# Patient Record
Sex: Female | Born: 1955 | Race: White | Hispanic: No | Smoking: Never smoker
Health system: Southern US, Community
[De-identification: ages and names within clinical notes are randomized; demographics above are authoritative.]

## PROBLEM LIST (undated history)

## (undated) DIAGNOSIS — Z932 Ileostomy status: Secondary | ICD-10-CM

## (undated) DIAGNOSIS — Z95828 Presence of other vascular implants and grafts: Secondary | ICD-10-CM

## (undated) DIAGNOSIS — F1190 Opioid use, unspecified, uncomplicated: Secondary | ICD-10-CM

## (undated) DIAGNOSIS — Z8619 Personal history of other infectious and parasitic diseases: Secondary | ICD-10-CM

## (undated) DIAGNOSIS — G620 Drug-induced polyneuropathy: Secondary | ICD-10-CM

## (undated) DIAGNOSIS — K5190 Ulcerative colitis, unspecified, without complications: Secondary | ICD-10-CM

## (undated) DIAGNOSIS — R9430 Abnormal result of cardiovascular function study, unspecified: Secondary | ICD-10-CM

## (undated) DIAGNOSIS — C569 Malignant neoplasm of unspecified ovary: Secondary | ICD-10-CM

## (undated) DIAGNOSIS — T451X5A Adverse effect of antineoplastic and immunosuppressive drugs, initial encounter: Secondary | ICD-10-CM

## (undated) DIAGNOSIS — IMO0002 Reserved for concepts with insufficient information to code with codable children: Secondary | ICD-10-CM

## (undated) DIAGNOSIS — N182 Chronic kidney disease, stage 2 (mild): Secondary | ICD-10-CM

## (undated) DIAGNOSIS — C7951 Secondary malignant neoplasm of bone: Secondary | ICD-10-CM

## (undated) DIAGNOSIS — N1330 Unspecified hydronephrosis: Secondary | ICD-10-CM

## (undated) DIAGNOSIS — C772 Secondary and unspecified malignant neoplasm of intra-abdominal lymph nodes: Secondary | ICD-10-CM

## (undated) DIAGNOSIS — Z9981 Dependence on supplemental oxygen: Secondary | ICD-10-CM

## (undated) DIAGNOSIS — J90 Pleural effusion, not elsewhere classified: Secondary | ICD-10-CM

## (undated) DIAGNOSIS — I10 Essential (primary) hypertension: Secondary | ICD-10-CM

## (undated) DIAGNOSIS — D6959 Other secondary thrombocytopenia: Secondary | ICD-10-CM

## (undated) DIAGNOSIS — Z973 Presence of spectacles and contact lenses: Secondary | ICD-10-CM

## (undated) DIAGNOSIS — C7800 Secondary malignant neoplasm of unspecified lung: Secondary | ICD-10-CM

## (undated) DIAGNOSIS — IMO0001 Reserved for inherently not codable concepts without codable children: Secondary | ICD-10-CM

## (undated) DIAGNOSIS — G8929 Other chronic pain: Secondary | ICD-10-CM

## (undated) HISTORY — DX: Reserved for concepts with insufficient information to code with codable children: IMO0002

## (undated) HISTORY — DX: Reserved for inherently not codable concepts without codable children: IMO0001

## (undated) HISTORY — DX: Essential (primary) hypertension: I10

## (undated) HISTORY — PX: OTHER SURGICAL HISTORY: SHX169

---

## 1990-05-24 HISTORY — PX: CHOLECYSTECTOMY: SHX55

## 1998-12-25 ENCOUNTER — Other Ambulatory Visit: Admission: RE | Admit: 1998-12-25 | Discharge: 1998-12-25 | Payer: Self-pay | Admitting: Obstetrics and Gynecology

## 1999-12-10 ENCOUNTER — Encounter: Admission: RE | Admit: 1999-12-10 | Discharge: 1999-12-10 | Payer: Self-pay | Admitting: General Surgery

## 1999-12-10 ENCOUNTER — Encounter: Payer: Self-pay | Admitting: General Surgery

## 2000-12-13 ENCOUNTER — Encounter: Admission: RE | Admit: 2000-12-13 | Discharge: 2000-12-13 | Payer: Self-pay | Admitting: General Surgery

## 2000-12-13 ENCOUNTER — Encounter: Payer: Self-pay | Admitting: General Surgery

## 2002-12-04 ENCOUNTER — Encounter: Payer: Self-pay | Admitting: Obstetrics and Gynecology

## 2002-12-04 ENCOUNTER — Encounter: Admission: RE | Admit: 2002-12-04 | Discharge: 2002-12-04 | Payer: Self-pay | Admitting: Obstetrics and Gynecology

## 2005-01-14 ENCOUNTER — Encounter: Admission: RE | Admit: 2005-01-14 | Discharge: 2005-01-14 | Payer: Self-pay | Admitting: Obstetrics and Gynecology

## 2006-12-20 ENCOUNTER — Ambulatory Visit: Payer: Self-pay | Admitting: Family Medicine

## 2006-12-20 DIAGNOSIS — I10 Essential (primary) hypertension: Secondary | ICD-10-CM | POA: Insufficient documentation

## 2006-12-28 LAB — CONVERTED CEMR LAB
BUN: 11 mg/dL (ref 6–23)
Basophils Relative: 1.4 % — ABNORMAL HIGH (ref 0.0–1.0)
Calcium: 9.7 mg/dL (ref 8.4–10.5)
Cholesterol: 190 mg/dL (ref 0–200)
Creatinine, Ser: 0.7 mg/dL (ref 0.4–1.2)
GFR calc non Af Amer: 94 mL/min
Glucose, Bld: 91 mg/dL (ref 70–99)
HCT: 43 % (ref 36.0–46.0)
HDL: 41.7 mg/dL (ref >39.0)
Hemoglobin: 14.9 g/dL (ref 12.0–15.0)
MCHC: 34.6 g/dL (ref 30.0–36.0)
MCV: 89.3 fL (ref 78.0–100.0)
Neutrophils Relative %: 66 % (ref 43.0–77.0)
TSH: 0.83 microintl units/mL (ref 0.35–5.50)
Total CHOL/HDL Ratio: 4.6
Total Protein: 6.6 g/dL (ref 6.0–8.3)
Triglycerides: 202 mg/dL (ref 0–149)
VLDL: 40 mg/dL (ref 0–40)

## 2007-01-16 ENCOUNTER — Ambulatory Visit: Payer: Self-pay | Admitting: Family Medicine

## 2007-02-24 ENCOUNTER — Encounter: Admission: RE | Admit: 2007-02-24 | Discharge: 2007-02-24 | Payer: Self-pay | Admitting: Family Medicine

## 2007-03-08 ENCOUNTER — Encounter (INDEPENDENT_AMBULATORY_CARE_PROVIDER_SITE_OTHER): Payer: Self-pay | Admitting: *Deleted

## 2007-03-27 ENCOUNTER — Ambulatory Visit: Payer: Self-pay | Admitting: Family Medicine

## 2007-03-28 LAB — CONVERTED CEMR LAB
Albumin: 4.2 g/dL (ref 3.5–5.2)
Alkaline Phosphatase: 75 units/L (ref 39–117)
Bilirubin, Direct: 0.1 mg/dL (ref 0.0–0.3)
HDL: 44.5 mg/dL (ref >39.0)
LDL Cholesterol: 114 mg/dL — ABNORMAL HIGH (ref 0–99)
Total CHOL/HDL Ratio: 4.2
Total Protein: 6.7 g/dL (ref 6.0–8.3)
Triglycerides: 148 mg/dL (ref 0–149)
VLDL: 30 mg/dL (ref 0–40)

## 2007-09-04 ENCOUNTER — Ambulatory Visit: Payer: Self-pay | Admitting: Family Medicine

## 2008-02-26 ENCOUNTER — Encounter: Admission: RE | Admit: 2008-02-26 | Discharge: 2008-02-26 | Payer: Self-pay | Admitting: Family Medicine

## 2008-03-08 ENCOUNTER — Encounter (INDEPENDENT_AMBULATORY_CARE_PROVIDER_SITE_OTHER): Payer: Self-pay | Admitting: *Deleted

## 2008-03-25 ENCOUNTER — Ambulatory Visit: Payer: Self-pay | Admitting: Family Medicine

## 2008-04-01 LAB — CONVERTED CEMR LAB
Alkaline Phosphatase: 72 units/L (ref 39–117)
CO2: 31 meq/L (ref 19–32)
Calcium: 9.5 mg/dL (ref 8.4–10.5)
Chloride: 105 meq/L (ref 96–112)
Creatinine, Ser: 0.7 mg/dL (ref 0.4–1.2)
Eosinophils Absolute: 0.1 10*3/uL (ref 0.0–0.7)
Glucose, Bld: 92 mg/dL (ref 70–99)
HDL: 49 mg/dL (ref >39.0)
LDL Cholesterol: 96 mg/dL (ref 0–99)
MCHC: 35.4 g/dL (ref 30.0–36.0)
MCV: 90.9 fL (ref 78.0–100.0)
Monocytes Absolute: 0.5 10*3/uL (ref 0.1–1.0)
Neutro Abs: 3.6 10*3/uL (ref 1.4–7.7)
Neutrophils Relative %: 66.6 % (ref 43.0–77.0)
RBC: 4.82 M/uL (ref 3.87–5.11)
Total CHOL/HDL Ratio: 3.4
Total Protein: 6.7 g/dL (ref 6.0–8.3)
Triglycerides: 111 mg/dL (ref 0–149)

## 2008-04-03 ENCOUNTER — Encounter (INDEPENDENT_AMBULATORY_CARE_PROVIDER_SITE_OTHER): Payer: Self-pay | Admitting: *Deleted

## 2008-10-09 ENCOUNTER — Ambulatory Visit: Payer: Self-pay | Admitting: Family Medicine

## 2009-03-19 ENCOUNTER — Encounter: Admission: RE | Admit: 2009-03-19 | Discharge: 2009-03-19 | Payer: Self-pay | Admitting: Family Medicine

## 2009-03-28 ENCOUNTER — Ambulatory Visit: Payer: Self-pay | Admitting: Family Medicine

## 2009-04-14 ENCOUNTER — Encounter: Payer: Self-pay | Admitting: Family Medicine

## 2009-04-14 LAB — CONVERTED CEMR LAB
BUN: 13 mg/dL (ref 6–23)
Basophils Absolute: 0 10*3/uL (ref 0.0–0.1)
Basophils Relative: 1 % (ref 0–1)
Bilirubin, Direct: 0.1 mg/dL (ref 0.0–0.3)
CO2: 27 meq/L (ref 19–32)
Calcium: 10 mg/dL (ref 8.4–10.5)
Chloride: 102 meq/L (ref 96–112)
Eosinophils Absolute: 0.1 10*3/uL (ref 0.0–0.7)
Eosinophils Relative: 2 % (ref 0–5)
HDL: 52 mg/dL (ref >39)
Lymphocytes Relative: 25 % (ref 12–46)
Lymphs Abs: 1.5 10*3/uL (ref 0.7–4.0)
MCHC: 33.9 g/dL (ref 30.0–36.0)
MCV: 89.8 fL (ref 78.0–100.0)
Monocytes Absolute: 0.6 10*3/uL (ref 0.1–1.0)
Monocytes Relative: 11 % (ref 3–12)
Neutro Abs: 3.7 10*3/uL (ref 1.7–7.7)
Neutrophils Relative %: 62 % (ref 43–77)
Platelets: 207 10*3/uL (ref 150–400)
RBC: 5.02 M/uL (ref 3.87–5.11)
Total Bilirubin: 0.6 mg/dL (ref 0.3–1.2)
Triglycerides: 118 mg/dL (ref <150)
VLDL: 24 mg/dL (ref 0–40)

## 2009-10-28 ENCOUNTER — Ambulatory Visit: Payer: Self-pay | Admitting: Family Medicine

## 2009-10-30 LAB — CONVERTED CEMR LAB
Chloride: 102 meq/L (ref 96–112)
GFR calc non Af Amer: 122.26 mL/min (ref >60)
Potassium: 3.8 meq/L (ref 3.5–5.1)
Sodium: 140 meq/L (ref 135–145)

## 2010-03-20 ENCOUNTER — Encounter: Admission: RE | Admit: 2010-03-20 | Discharge: 2010-03-20 | Payer: Self-pay | Admitting: Family Medicine

## 2010-04-27 ENCOUNTER — Ambulatory Visit: Payer: Self-pay | Admitting: Family Medicine

## 2010-06-23 NOTE — Assessment & Plan Note (Signed)
Summary: bp check/cbs   Vital Signs:  Patient profile:   55 year old female Weight:      158 pounds Pulse rate:   76 / minute Pulse rhythm:   regular BP sitting:   120 / 70  (right arm) Cuff size:   large  Vitals Entered By: Almeta Monas CMA Duncan Dull) (April 27, 2010 3:49 PM) CC: BP check   History of Present Illness:  Hypertension follow-up      This is a 55 year old woman who presents for Hypertension follow-up.  The patient denies lightheadedness, urinary frequency, headaches, edema, impotence, rash, and fatigue.  The patient denies the following associated symptoms: chest pain, chest pressure, exercise intolerance, dyspnea, palpitations, syncope, leg edema, and pedal edema.  Compliance with medications (by patient report) has been near 100%.  The patient reports that dietary compliance has been good.  The patient reports exercising daily.  Adjunctive measures currently used by the patient include salt restriction.    Problems Prior to Update: 1)  Hypertension  (ICD-401.9) 2)  Family History Diabetes 1st Degree Relative  (ICD-V18.0)  Medications Prior to Update: 1)  Calcium Plus D .... 1 By Mouth Two Times A Day 2)  Mvi .Marland Kitchen.. 1 Once Daily 3)  Zestoretic 10-12.5 Mg  Tabs (Lisinopril-Hydrochlorothiazide) .Marland Kitchen.. 1 By Mouth Once Daily  Current Medications (verified): 1)  Calcium Plus D .... 1 By Mouth Two Times A Day 2)  Mvi .Marland Kitchen.. 1 Once Daily 3)  Zestoretic 10-12.5 Mg  Tabs (Lisinopril-Hydrochlorothiazide) .Marland Kitchen.. 1 By Mouth Once Daily  Allergies (verified): 1)  ! 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 MOTHER:LIVING FATHER:LIVING 2 SISTERS:LIVING 1  BROTHER:LIVING Family History Hypertension-MOTHER,FATHER, AND BROTHER Family History Diabetes 1st degree relative-FATHER AND BROTHER  Family History of Arthritis-MOTHER  Social History: Reviewed history from 12/20/2006 and no changes required. 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 Lungs are clear to auscultation, no crackles or wheezes. Heart:  normal rate and no murmur.   Extremities:  No clubbing, cyanosis, edema, or deformity noted with normal full range of motion of all joints.   Psych:  Cognition and judgment appear intact. Alert and cooperative with normal attention span and concentration. No apparent delusions, illusions, hallucinations   Impression & Recommendations:  Problem # 1:  HYPERTENSION (ICD-401.9)  Her updated medication list for this problem includes:    Zestoretic 10-12.5 Mg Tabs (Lisinopril-hydrochlorothiazide) .Marland Kitchen... 1 by mouth once daily  BP today: 120/70 Prior BP: 110/70 (10/28/2009)  Labs Reviewed: K+: 3.8 (10/28/2009) Creat: : 0.6 (10/28/2009)   Chol: 180 (03/28/2009)   HDL: 52 (03/28/2009)   LDL: 104 (03/28/2009)   TG: 118 (03/28/2009)  Complete Medication  List: 1)  Calcium  Plus D  .... 1 by mouth two times a day 2)  Mvi  .Marland Kitchen.. 1 once daily 3)  Zestoretic 10-12.5 Mg Tabs (Lisinopril-hydrochlorothiazide) .Marland Kitchen.. 1 by mouth once daily  Patient Instructions: 1)  Please schedule a follow-up appointment in 6 month---for physical  Prescriptions: ZESTORETIC 10-12.5 MG  TABS (LISINOPRIL-HYDROCHLOROTHIAZIDE) 1 by mouth once daily  #30 x 5   Entered and Authorized by:   Loreen Freud DO   Signed by:   Loreen Freud DO on 04/27/2010   Method used:   Electronically to        [id number]* (retail)       [address]       [place], [place]  [address]       Ph: [phone] or [phone]       Fax: [phone]   RxID:   [id number]    Orders Added: 1)  Est. Patient Level III [74259]

## 2010-06-23 NOTE — Assessment & Plan Note (Signed)
Summary: bp check - refill med/cbs   Vital Signs:  Patient profile:   55 year old female Height:      62 inches Weight:      158 pounds BMI:     29.00 Pulse rate:   75 / minute Pulse rhythm:   regular BP sitting:   110 / 70  (left arm) Cuff size:   large  Vitals Entered By: Army Fossa CMA (October 28, 2009 12:44 PM) CC: Pt here for BP check.   History of Present Illness:  Hypertension follow-up      This is a 55 year old woman who presents for Hypertension follow-up.  The patient denies lightheadedness, urinary frequency, headaches, edema, impotence, rash, and fatigue.  The patient denies the following associated symptoms: chest pain, chest pressure, exercise intolerance, dyspnea, palpitations, syncope, leg edema, and pedal edema.  Compliance with medications (by patient report) has been near 100%.  The patient reports that dietary compliance has been good.  The patient reports exercising daily.  Adjunctive measures currently used by the patient include salt restriction.    Preventive Screening-Counseling & Management  Alcohol-Tobacco     Smoking Status: never  Caffeine-Diet-Exercise     Does Patient Exercise: yes     Type of exercise: wallks     Times/week: 6  Current Medications (verified): 1)  Calcium Plus D .... 1 By Mouth Two Times A Day 2)  Mvi .Marland Kitchen.. 1 Once Daily 3)  Zestoretic 10-12.5 Mg  Tabs (Lisinopril-Hydrochlorothiazide) .Marland Kitchen.. 1 By Mouth Once Daily  Allergies: 1)  ! Pcn  Past History:  Past medical, surgical, family and social histories (including risk factors) reviewed for relevance to current acute and chronic problems.  Past Medical History: Reviewed history from 12/20/2006 and no changes required. Hypertension colitis-  [place]  Past Surgical History: Reviewed history from 12/20/2006 and no changes required. REMOVAL OF LARGE INTESTINE-AUG 6,1990---Tim Earlene Plater , surgeon Cholecystectomy-1991-1992  Family History: Reviewed history from 12/20/2006 and  no changes required. MOTHER:LIVING FATHER:LIVING 2 SISTERS:LIVING 1  BROTHER:LIVING Family History Hypertension-MOTHER,FATHER, AND BROTHER Family History Diabetes 1st degree relative-FATHER AND BROTHER  Family History of Arthritis-MOTHER  Social History: Reviewed history from 12/20/2006 and no changes required. 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 Lungs are clear to auscultation, no crackles or wheezes. Heart:  Normal rate and regular rhythm. S1 and S2 normal without gallop, murmur, click, rub or other extra sounds. Extremities:  No clubbing, cyanosis, edema, or deformity noted with normal full range of motion of all joints.   Psych:  Oriented X3 and normally interactive.     Impression & Recommendations:  Problem # 1:  HYPERTENSION (ICD-401.9)  Her updated medication list for this problem includes:    Zestoretic 10-12.5 Mg Tabs (Lisinopril-hydrochlorothiazide) .Marland Kitchen... 1 by mouth once daily  Orders: Venipuncture ([address]) TLB-BMP (Basic Metabolic Panel-BMET) (80048-METABOL)  BP today: 110/70 Prior BP: 128/82 (03/28/2009)  Labs Reviewed: K+: 4.1 (03/28/2009) Creat: : 0.70 (03/28/2009)   Chol: 180 (03/28/2009)   HDL: 52 (03/28/2009)   LDL: 104 (03/28/2009)   TG: 118 (03/28/2009)  Complete Medication List: 1)  Calcium Plus D  .... 1 by mouth two times a day 2)  Mvi  .Marland Kitchen.. 1 once daily 3)  Zestoretic 10-12.5 Mg Tabs (Lisinopril-hydrochlorothiazide) .Marland KitchenMarland KitchenMarland Kitchen  1 by mouth once daily Prescriptions: ZESTORETIC 10-12.5 MG  TABS (LISINOPRIL-HYDROCHLOROTHIAZIDE) 1 by mouth once daily  #30 x 5   Entered and Authorized by:   Loreen Freud DO   Signed by:   Loreen Freud DO on  10/28/2009   Method used:   Electronically to        [id number]* (retail)       [address]       [place], [place]  [address]       Ph: [phone] or [phone]       Fax: [phone]   RxID:   [id number]

## 2010-11-04 ENCOUNTER — Encounter: Payer: Self-pay | Admitting: Family Medicine

## 2010-11-09 ENCOUNTER — Encounter: Payer: Self-pay | Admitting: Family Medicine

## 2010-11-09 ENCOUNTER — Ambulatory Visit (INDEPENDENT_AMBULATORY_CARE_PROVIDER_SITE_OTHER): Payer: BC Managed Care – PPO | Admitting: Family Medicine

## 2010-11-09 VITALS — BP 122/70 | HR 70 | Temp 97.5°F | Ht 63.0 in | Wt 162.0 lb

## 2010-11-09 DIAGNOSIS — Z0000 Encounter for general adult medical examination without abnormal findings: Secondary | ICD-10-CM

## 2010-11-09 DIAGNOSIS — R319 Hematuria, unspecified: Secondary | ICD-10-CM

## 2010-11-09 DIAGNOSIS — I10 Essential (primary) hypertension: Secondary | ICD-10-CM

## 2010-11-09 LAB — HEPATIC FUNCTION PANEL
ALT: 21 U/L (ref 0–35)
AST: 22 U/L (ref 0–37)
Bilirubin, Direct: 0.1 mg/dL (ref 0.0–0.3)
Total Protein: 7 g/dL (ref 6.0–8.3)

## 2010-11-09 LAB — CBC WITH DIFFERENTIAL/PLATELET
Basophils Relative: 0.8 % (ref 0.0–3.0)
Eosinophils Absolute: 0.1 10*3/uL (ref 0.0–0.7)
Hemoglobin: 15.7 g/dL — ABNORMAL HIGH (ref 12.0–15.0)
Monocytes Absolute: 0.5 10*3/uL (ref 0.1–1.0)
Monocytes Relative: 7.8 % (ref 3.0–12.0)
Neutrophils Relative %: 67.4 % (ref 43.0–77.0)
RDW: 12.9 % (ref 11.5–14.6)
WBC: 6 10*3/uL (ref 4.5–10.5)

## 2010-11-09 LAB — BASIC METABOLIC PANEL
BUN: 14 mg/dL (ref 6–23)
CO2: 27 mEq/L (ref 19–32)
Calcium: 9.4 mg/dL (ref 8.4–10.5)
Creatinine, Ser: 0.5 mg/dL (ref 0.4–1.2)
Sodium: 140 mEq/L (ref 135–145)

## 2010-11-09 LAB — LIPID PANEL
Cholesterol: 206 mg/dL — ABNORMAL HIGH (ref 0–200)
HDL: 56 mg/dL (ref >39.00)
Total CHOL/HDL Ratio: 4
VLDL: 26 mg/dL (ref 0.0–40.0)

## 2010-11-09 LAB — POCT URINALYSIS DIPSTICK
Bilirubin, UA: NEGATIVE
Urobilinogen, UA: 0.2
pH, UA: 5

## 2010-11-09 LAB — LDL CHOLESTEROL, DIRECT: Direct LDL: 132.3 mg/dL

## 2010-11-09 MED ORDER — LISINOPRIL-HYDROCHLOROTHIAZIDE 10-12.5 MG PO TABS: 1.0000 | ORAL_TABLET | Freq: Every day | ORAL | Status: DC

## 2010-11-09 NOTE — Patient Instructions (Signed)

## 2010-11-09 NOTE — Progress Notes (Signed)
m-Subjective:     Kirsten Schroeder is a 55 y.o. female and is here for a comprehensive physical exam. The patient reports no problems.  History   Social History  . Marital Status: Divorced    Spouse Name: N/A    Number of Children: 2  . 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 Smoking status: Never Smoker   . Smokeless tobacco: Never Used  . Alcohol Use: No  . Drug Use: No  . Sexually Active: No   Other Topics Concern  . Not on file   Social History Narrative  . No narrative on file   Health Maintenance  Topic Date Due  . Pap Smear  06/22/1973  . Tetanus/tdap  06/22/1974  . Colonoscopy  06/22/2005  . Influenza Vaccine  02/22/2011  . Mammogram  03/20/2012    The following portions of the patient's history were reviewed and updated as appropriate: allergies, current medications, past family history, past medical history, past social history, past surgical history and problem list. Past Medical History  Diagnosis Date  . Hypertension   . Colitis     [place]   History   Social History  . Marital Status: Divorced    Spouse Name: N/A    Number of Children: 2  . 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 Smoking status: Never Smoker   . Smokeless tobacco: Never Used  . Alcohol Use: No  . Drug Use: No  . Sexually Active: No   Other Topics Concern  . Not on file   Social History Narrative  . No narrative on file   Family History  Problem Relation Age of Onset  . Hypertension Mother   . Arthritis Mother   . Hypertension Father   . Diabetes Father   . Hypertension Brother   . Diabetes Brother    Current Outpatient Prescriptions on File Prior to Visit  Medication Sig Dispense Refill  . Calcium Carbonate-Vitamin D (CALCIUM + D PO) Take 1 tablet by mouth 2 (two) times daily.        Marland Kitchen  lisinopril-hydrochlorothiazide (ZESTORETIC) 10-12.5 MG per tablet Take 1 tablet by mouth daily.        . Multiple Vitamin (MULTIVITAMIN) tablet Take 1 tablet by mouth daily.         Past Surgical History  Procedure Date  . Abdominal surgery 12/27/1988    Removal of Large Intestine---Tim Earlene Plater, Careers adviser  . Cholecystectomy [phone]    Review of Systems Review of Systems  Constitutional: Negative for activity change, appetite change and fatigue.  HENT: Negative for hearing loss, congestion, tinnitus and ear discharge.  dentist q51m Eyes: Negative for visual disturbance (see optho q1y -- vision corrected to 20/20 with glasses).  Respiratory: Negative for cough, chest tightness and shortness of breath.   Cardiovascular: Negative for chest pain, palpitations and leg swelling.  Gastrointestinal: Negative for abdominal pain, diarrhea, constipation and abdominal distention.  Genitourinary: Negative for urgency, frequency, decreased urine volume and difficulty urinating.  Musculoskeletal: Negative for back pain, arthralgias and gait problem.  Skin: Negative for color change, pallor and rash.  Neurological: Negative for dizziness, light-headedness, numbness and headaches.  Hematological: Negative for adenopathy. Does not bruise/bleed easily.  Psychiatric/Behavioral: Negative for suicidal ideas, confusion, sleep disturbance, self-injury, dysphoric mood, decreased concentration and agitation.   surgery--colitis  Objective:    BP 122/70  Pulse 70  Temp(Src) 97.5 F (36.4 C) (Oral)  Ht 5\' 3"  (1.6 m)  Wt 162 lb (73.483 kg)  BMI 28.70 kg/m2  SpO2 99% General appearance: alert, cooperative and appears stated age Head: Normocephalic, without obvious abnormality, atraumatic Eyes: conjunctivae/corneas clear. PERRL, EOM's intact. Fundi benign. Ears: normal TM's and external ear canals both ears Nose: Nares normal. Septum midline. Mucosa normal. No drainage or sinus tenderness. Throat:  lips, mucosa, and tongue normal; teeth and gums normal Neck: no adenopathy, no carotid bruit, no JVD, supple, symmetrical, trachea midline and thyroid not enlarged, symmetric, no tenderness/mass/nodules Lungs: clear to auscultation bilaterally Breasts: gyn Heart: regular rate and rhythm, S1, S2 normal, no murmur, click, rub or gallop Abdomen: soft, non-tender; bowel sounds normal; no masses,  no organomegaly Pelvic: gyn Extremities: extremities normal, atraumatic, no cyanosis or edema Pulses: 2+ and symmetric Skin: Skin color, texture, turgor normal. No rashes or lesions Lymph nodes: Cervical, supraclavicular, and axillary nodes normal. Neurologic: Grossly normal rectal per gyn    Assessment:    Healthy female exam.    Htn  colitis--per surgery Plan:    ghm utd con't meds Check bmd with next mammogram See After Visit Summary for Counseling Recommendations

## 2010-11-09 NOTE — Progress Notes (Signed)
Addended by: Legrand Como on: 11/09/2010 10:58 AM   Modules accepted: Orders

## 2010-11-11 LAB — URINE CULTURE

## 2010-11-12 ENCOUNTER — Encounter: Payer: Self-pay | Admitting: *Deleted

## 2011-02-16 ENCOUNTER — Other Ambulatory Visit: Payer: Self-pay | Admitting: Family Medicine

## 2011-02-16 DIAGNOSIS — Z1231 Encounter for screening mammogram for malignant neoplasm of breast: Secondary | ICD-10-CM

## 2011-02-26 ENCOUNTER — Encounter: Payer: Self-pay | Admitting: Family Medicine

## 2011-03-01 ENCOUNTER — Ambulatory Visit (INDEPENDENT_AMBULATORY_CARE_PROVIDER_SITE_OTHER): Payer: BC Managed Care – PPO | Admitting: Family Medicine

## 2011-03-01 ENCOUNTER — Encounter: Payer: Self-pay | Admitting: Family Medicine

## 2011-03-01 VITALS — BP 134/82 | HR 76 | Temp 98.8°F | Ht 62.0 in | Wt 160.0 lb

## 2011-03-01 DIAGNOSIS — R319 Hematuria, unspecified: Secondary | ICD-10-CM

## 2011-03-01 DIAGNOSIS — I10 Essential (primary) hypertension: Secondary | ICD-10-CM

## 2011-03-01 DIAGNOSIS — E785 Hyperlipidemia, unspecified: Secondary | ICD-10-CM

## 2011-03-01 DIAGNOSIS — Z23 Encounter for immunization: Secondary | ICD-10-CM

## 2011-03-01 LAB — BASIC METABOLIC PANEL
BUN: 12 mg/dL (ref 6–23)
CO2: 27 mEq/L (ref 19–32)
Calcium: 9.2 mg/dL (ref 8.4–10.5)
Chloride: 105 mEq/L (ref 96–112)
Glucose, Bld: 99 mg/dL (ref 70–99)
Sodium: 139 mEq/L (ref 135–145)

## 2011-03-01 LAB — LIPID PANEL
HDL: 48.8 mg/dL (ref >39.00)
LDL Cholesterol: 98 mg/dL (ref 0–99)

## 2011-03-01 LAB — POCT URINALYSIS DIPSTICK
Ketones, UA: NEGATIVE
Protein, UA: NEGATIVE
Urobilinogen, UA: 0.2

## 2011-03-01 LAB — HEPATIC FUNCTION PANEL
AST: 26 U/L (ref 0–37)
Total Protein: 7 g/dL (ref 6.0–8.3)

## 2011-03-01 NOTE — Patient Instructions (Signed)

## 2011-03-01 NOTE — Progress Notes (Signed)
  Subjective:    Patient here for follow-up of elevated blood pressure.  She is exercising and is adherent to a low-salt diet.  Blood pressure is well controlled at home. Cardiac symptoms: none. Patient denies: chest pain, chest pressure/discomfort, claudication, dyspnea, exertional chest pressure/discomfort, fatigue, irregular heart beat, lower extremity edema, near-syncope, orthopnea, palpitations, paroxysmal nocturnal dyspnea, syncope and tachypnea. Cardiovascular risk factors: none. Use of agents associated with hypertension: none. History of target organ damage: none.  The following portions of the patient's history were reviewed and updated as appropriate: allergies, current medications, past family history, past medical history, past social history, past surgical history and problem list.  Review of Systems Pertinent items are noted in HPI.     Objective:    BP 134/82  Pulse 76  Temp(Src) 98.8 F (37.1 C) (Oral)  Wt 160 lb (72.576 kg)  SpO2 96% General appearance: alert, cooperative, appears stated age and no distress Neck: no adenopathy, no carotid bruit, no JVD, supple, symmetrical, trachea midline and thyroid not enlarged, symmetric, no tenderness/mass/nodules Lungs: clear to auscultation bilaterally Heart: regular rate and rhythm, S1, S2 normal, no murmur, click, rub or gallop Extremities: extremities normal, atraumatic, no cyanosis or edema    Assessment:    Hypertension, normal blood pressure . Evidence of target organ damage: none.   hyperlipidemia--- check labs today  Plan:    Medication: no change. Dietary sodium restriction. Regular aerobic exercise. Follow up: 6 months and as needed.

## 2011-03-03 LAB — URINE CULTURE: Colony Count: NO GROWTH

## 2011-03-24 ENCOUNTER — Ambulatory Visit
Admission: RE | Admit: 2011-03-24 | Discharge: 2011-03-24 | Disposition: A | Discharge status: Home / Self Care | Payer: BC Managed Care – PPO | Source: Ambulatory Visit | Attending: Family Medicine | Admitting: Family Medicine

## 2011-03-24 DIAGNOSIS — Z1231 Encounter for screening mammogram for malignant neoplasm of breast: Secondary | ICD-10-CM

## 2011-11-16 ENCOUNTER — Encounter: Payer: Self-pay | Admitting: Family Medicine

## 2011-11-16 ENCOUNTER — Ambulatory Visit (INDEPENDENT_AMBULATORY_CARE_PROVIDER_SITE_OTHER): Payer: BC Managed Care – PPO | Admitting: Family Medicine

## 2011-11-16 VITALS — BP 128/80 | HR 71 | Temp 98.3°F | Wt 156.4 lb

## 2011-11-16 DIAGNOSIS — N390 Urinary tract infection, site not specified: Secondary | ICD-10-CM

## 2011-11-16 DIAGNOSIS — I10 Essential (primary) hypertension: Secondary | ICD-10-CM

## 2011-11-16 DIAGNOSIS — Z23 Encounter for immunization: Secondary | ICD-10-CM

## 2011-11-16 LAB — HEPATIC FUNCTION PANEL
Albumin: 4.4 g/dL (ref 3.5–5.2)
Bilirubin, Direct: 0.1 mg/dL (ref 0.0–0.3)
Total Bilirubin: 0.6 mg/dL (ref 0.3–1.2)

## 2011-11-16 LAB — BASIC METABOLIC PANEL
BUN: 11 mg/dL (ref 6–23)
CO2: 31 mEq/L (ref 19–32)
Calcium: 10.3 mg/dL (ref 8.4–10.5)
Creatinine, Ser: 0.6 mg/dL (ref 0.4–1.2)
Glucose, Bld: 94 mg/dL (ref 70–99)
Potassium: 4.7 mEq/L (ref 3.5–5.1)
Sodium: 141 mEq/L (ref 135–145)

## 2011-11-16 LAB — LIPID PANEL
Cholesterol: 205 mg/dL — ABNORMAL HIGH (ref 0–200)
Total CHOL/HDL Ratio: 4
Triglycerides: 204 mg/dL — ABNORMAL HIGH (ref 0.0–149.0)

## 2011-11-16 LAB — POCT URINALYSIS DIPSTICK
Bilirubin, UA: NEGATIVE
Spec Grav, UA: 1.01
pH, UA: 5

## 2011-11-16 MED ORDER — LISINOPRIL-HYDROCHLOROTHIAZIDE 10-12.5 MG PO TABS: 1.0000 | ORAL_TABLET | Freq: Every day | ORAL | Status: DC

## 2011-11-16 NOTE — Progress Notes (Signed)
  Subjective:    Patient here for follow-up of elevated blood pressure.  She is exercising and is adherent to a low-salt diet.  Blood pressure is well controlled at home. Cardiac symptoms: none. Patient denies: chest pain, chest pressure/discomfort, claudication, dyspnea, exertional chest pressure/discomfort, fatigue, irregular heart beat, lower extremity edema, near-syncope, orthopnea, palpitations, paroxysmal nocturnal dyspnea, syncope and tachypnea. Cardiovascular risk factors: hypertension. Use of agents associated with hypertension: none. History of target organ damage: none.  The following portions of the patient's history were reviewed and updated as appropriate: allergies, current medications, past family history, past medical history, past social history, past surgical history and problem list.  Review of Systems Pertinent items are noted in HPI.     Objective:    BP 128/80  Pulse 71  Temp 98.3 F (36.8 C) (Oral)  Wt 156 lb 6.4 oz (70.943 kg)  SpO2 95% General appearance: alert, cooperative, appears stated age and no distress Lungs: clear to auscultation bilaterally Heart: S1, S2 normal Extremities: extremities normal, atraumatic, no cyanosis or edema    Assessment:    Hypertension, normal blood pressure . Evidence of target organ damage: none.    Plan:    Medication: no change. Dietary sodium restriction. Regular aerobic exercise. Check blood pressures 2-3 times weekly and record. Follow up: 6 months and as needed.

## 2011-11-16 NOTE — Patient Instructions (Addendum)

## 2011-11-18 LAB — URINE CULTURE: Colony Count: NO GROWTH

## 2012-02-22 ENCOUNTER — Other Ambulatory Visit: Payer: Self-pay | Admitting: Family Medicine

## 2012-02-22 DIAGNOSIS — Z1231 Encounter for screening mammogram for malignant neoplasm of breast: Secondary | ICD-10-CM

## 2012-03-27 ENCOUNTER — Ambulatory Visit
Admission: RE | Admit: 2012-03-27 | Discharge: 2012-03-27 | Disposition: A | Discharge status: Home / Self Care | Payer: BC Managed Care – PPO | Source: Ambulatory Visit | Attending: Family Medicine | Admitting: Family Medicine

## 2012-03-27 DIAGNOSIS — Z1231 Encounter for screening mammogram for malignant neoplasm of breast: Secondary | ICD-10-CM

## 2012-04-07 ENCOUNTER — Emergency Department (HOSPITAL_COMMUNITY)
Admission: EM | Admit: 2012-04-07 | Discharge: 2012-04-07 | Disposition: A | Discharge status: Home / Self Care | Payer: BC Managed Care – PPO | Attending: Emergency Medicine | Admitting: Emergency Medicine

## 2012-04-07 ENCOUNTER — Encounter (HOSPITAL_COMMUNITY): Payer: Self-pay | Admitting: Emergency Medicine

## 2012-04-07 DIAGNOSIS — K921 Melena: Secondary | ICD-10-CM | POA: Insufficient documentation

## 2012-04-07 DIAGNOSIS — K922 Gastrointestinal hemorrhage, unspecified: Secondary | ICD-10-CM

## 2012-04-07 LAB — COMPREHENSIVE METABOLIC PANEL
AST: 22 U/L (ref 0–37)
Alkaline Phosphatase: 79 U/L (ref 39–117)
BUN: 11 mg/dL (ref 6–23)
Calcium: 9.5 mg/dL (ref 8.4–10.5)
GFR calc Af Amer: >90 mL/min (ref >90)
GFR calc non Af Amer: >90 mL/min (ref >90)
Glucose, Bld: 95 mg/dL (ref 70–99)
Potassium: 3.6 mEq/L (ref 3.5–5.1)
Sodium: 138 mEq/L (ref 135–145)

## 2012-04-07 LAB — CBC
Hemoglobin: 14.6 g/dL (ref 12.0–15.0)
RBC: 4.85 MIL/uL (ref 3.87–5.11)

## 2012-04-07 LAB — ABO/RH: ABO/RH(D): O POS

## 2012-04-07 NOTE — ED Notes (Signed)
Pt c/o of bleeding in stool. Has a colostomy bag and she noticed bright red blood last night. This morning she did not notice any but around lunch time she began to see it again. Denies abdominal pain, dizziness, weakness but has been feeling like she is colder than normal. Upon assessment of colostomy bag there was a small amount of blood along with stool in bag.

## 2012-04-07 NOTE — ED Provider Notes (Signed)
History     CSN: [id number]  Arrival date & time 04/07/12  1742   First MD Initiated Contact with Patient 04/07/12 1848      Chief Complaint  Patient presents with  . GI Bleeding    (Consider location/radiation/quality/duration/timing/severity/associated sxs/prior treatment) HPI The patient presents with concerns of bleeding into her colostomy bag.  She has had a colostomy bag for greater than 20 years.  She notes over the past 2 days she's had several episodes of blood in her back.  She denies concurrent abdominal pain, nausea, vomiting, fever, chills, dyspnea, chest pain, or any other focal complaints.  She denies any recent dietary changes. She states that over the past 2 days there has been intermittent episodes of blood in her back.  No clear precipitant, or any bleeding or exacerbating factors. Past Medical History  Diagnosis Date  . Hypertension   . Colitis         Past Surgical History  Procedure Date  . Abdominal surgery 12/27/1988    Removal of Large Intestine---Tim Earlene Plater, Careers adviser  . Cholecystectomy 1991-1992    Family History  Problem Relation Age of Onset  . Hypertension Mother   . Arthritis Mother   . Hypertension Father   . Diabetes Father   . Hypertension Brother   . Diabetes Brother     History  Substance Use Topics  . Smoking status: Never Smoker   . Smokeless tobacco: Never Used  . Alcohol Use: No    OB History    Grav Para Term Preterm Abortions TAB SAB Ect Mult Living                  Review of Systems  Constitutional:       Per HPI, otherwise negative  HENT:       Per HPI, otherwise negative  Eyes: Negative.   Respiratory:       Per HPI, otherwise negative  Cardiovascular:       Per HPI, otherwise negative  Gastrointestinal: Negative for vomiting.  Genitourinary: Negative.   Musculoskeletal:       Per HPI, otherwise negative  Skin: Negative.   Neurological: Negative for syncope.    Allergies  Penicillins  Home  Medications   Current Outpatient Rx  Name  Route  Sig  Dispense  Refill  . ACETAMINOPHEN 500 MG PO TABS   Oral   Take 500 mg by mouth every 6 (six) hours as needed. Pain         . CALCIUM + D PO   Oral   Take 1 tablet by mouth 2 (two) times daily.           Marland Kitchen FLAXSEED OIL 1000 MG PO CAPS   Oral   Take 1 capsule by mouth every other day.          Marland Kitchen LISINOPRIL-HYDROCHLOROTHIAZIDE 10-12.5 MG PO TABS   Oral   Take 1 tablet by mouth daily.   90 tablet   3   . ONE-DAILY MULTI VITAMINS PO TABS   Oral   Take 1 tablet by mouth daily.             BP 142/76  Pulse 92  Temp 98.5 F (36.9 C) (Oral)  Resp 18  SpO2 96%  Physical Exam  Nursing note and vitals reviewed. Constitutional: She is oriented to person, place, and time. She appears well-developed and well-nourished. No distress.  HENT:  Head: Normocephalic and atraumatic.  Eyes: Conjunctivae normal and EOM are  normal.  Cardiovascular: Normal rate and regular rhythm.   Pulmonary/Chest: Effort normal and breath sounds normal. No stridor. No respiratory distress.  Abdominal: Normal appearance and bowel sounds are normal. She exhibits no distension. There is no tenderness.    Musculoskeletal: She exhibits no edema.  Neurological: She is alert and oriented to person, place, and time. No cranial nerve deficit.  Skin: Skin is warm and dry.  Psychiatric: She has a normal mood and affect.    ED Course  Procedures (including critical care time)   Labs Reviewed  CBC  COMPREHENSIVE METABOLIC PANEL  TYPE AND SCREEN  ABO/RH   No results found.   No diagnosis found.    MDM  This generally well-appearing female with history of ulcerative colitis, now status post colostomy presents with concerns of ongoing place stool.  On exam she is in no distress.  The patient's hemoglobin is stable.  The patient's vital signs are unremarkable.  Absent distress, and with stable labs she was discharged to follow up with her  gastroenterologist.   Gerhard Munch, MD 04/07/12 [phone]

## 2012-04-10 ENCOUNTER — Encounter: Payer: Self-pay | Admitting: Gastroenterology

## 2012-04-10 LAB — TYPE AND SCREEN: PT AG Type: NEGATIVE

## 2012-05-09 ENCOUNTER — Encounter: Payer: Self-pay | Admitting: Gastroenterology

## 2012-05-09 ENCOUNTER — Ambulatory Visit (INDEPENDENT_AMBULATORY_CARE_PROVIDER_SITE_OTHER): Payer: BC Managed Care – PPO | Admitting: Gastroenterology

## 2012-05-09 VITALS — BP 120/84 | HR 84 | Ht 62.75 in | Wt 149.0 lb

## 2012-05-09 DIAGNOSIS — K922 Gastrointestinal hemorrhage, unspecified: Secondary | ICD-10-CM

## 2012-05-09 NOTE — Progress Notes (Signed)
HPI: This is a  very pleasant 56 year old woman whom I am meeting for the first time today.  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 Two part surgery, which ended up with her having oversewn anus, complete colectomy. Permanent ileostomy.  Currently has ileostomy.  Very active.  Never trouble with dehydration. Normally empties bag 3 times a day.  Had infectious enteritis, dehydrated, then acute red blood in ileostomy.  She went to the emergency room and had blood drawn. they offer her a CAT scan but she declined.  CBC 03/2012 was normal; Hb 14; CMET 03/2012 was normal   Review of systems: Pertinent positive and negative review of systems were noted in the above HPI section. Complete review of systems was performed and was otherwise normal.    Past Medical History  Diagnosis Date  . Hypertension   . Colitis     [place]  . GI bleed     Past Surgical History  Procedure Date  . Ileostomy 12/27/1988    Removal of Large Intestine---Tim Earlene Plater, Careers adviser  . Cholecystectomy 1991-1992    Current Outpatient Prescriptions  Medication Sig Dispense Refill  . Calcium Carbonate-Vitamin D (CALCIUM + D PO) Take 1 tablet by mouth 2 (two) times daily.        . Flaxseed, Linseed, (FLAXSEED OIL) 1000 MG CAPS Take 1 capsule by mouth every other day.       . lisinopril-hydrochlorothiazide (ZESTORETIC) 10-12.5 MG per tablet Take 1 tablet by mouth daily.  90 tablet  3  . Multiple Vitamin (MULTIVITAMIN) tablet Take 1 tablet by mouth daily.          Allergies as of 05/09/2012 - Review Complete 05/09/2012  Allergen Reaction Noted  . Penicillins      Family History  Problem Relation Age of Onset  . Hypertension Mother   . Arthritis Mother   . Hypertension Father   . Diabetes Father   . Hypertension Brother   . Diabetes Brother     History   Social History  . Marital Status: Divorced    Spouse Name: N/A    Number of Children:  2  . 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 Smoking status: Never Smoker   . Smokeless tobacco: Never Used  . Alcohol Use: No  . Drug Use: No  . Sexually Active: No   Other Topics Concern  . Not on file   Social History Narrative   Gets reg exercise       Physical Exam: BP 120/84  Pulse 84  Ht 5' 2.75" (1.594 m)  Wt 149 lb (67.586 kg)  BMI 26.60 kg/m2  SpO2 95% Constitutional: generally well-appearing Psychiatric: alert and oriented x3 Eyes: extraocular movements intact Mouth: oral pharynx moist, no lesions Neck: supple no lymphadenopathy Cardiovascular: heart regular rate and rhythm Lungs: clear to auscultation bilaterally Abdomen: soft, nontender, nondistended, no obvious ascites, no peritoneal signs, normal bowel sounds Extremities: no lower extremity edema bilaterally Skin: no lesions on visible extremities Right sided ileostomy bag, stoma was absolutely normal appearing with brown effluent in bag   Assessment and plan: 56 y.o. female with  permanent right sided ileostomy, recent red blood in ileostomy  There was an infectious diarrheal illness at her workplace around the time of her trouble. She had diarrhea, worsening ileostomy output which culminated in bright red blood into  her ostomy. She was not anemic, deep record blood completely stopped. This was about a month ago and she has had no troubles since then. She had no troubles prior to then either. I think ileostomy evaluation with a colonoscope would be reasonable however I explained to her that it is unlikely there is anything bad going on. She agreed and wishes to simply be followed clinically. She did agree to call if she has recurrent blood in her ileostomy and then at that point we'll proceed with ileoscopy.

## 2012-05-09 NOTE — Patient Instructions (Addendum)
Please call if you any further GI bleeding.

## 2012-05-26 ENCOUNTER — Encounter: Payer: Self-pay | Admitting: Family Medicine

## 2012-05-26 ENCOUNTER — Ambulatory Visit (INDEPENDENT_AMBULATORY_CARE_PROVIDER_SITE_OTHER): Payer: BC Managed Care – PPO | Admitting: Family Medicine

## 2012-05-26 VITALS — BP 120/76 | HR 75 | Temp 98.2°F | Wt 152.6 lb

## 2012-05-26 DIAGNOSIS — K529 Noninfective gastroenteritis and colitis, unspecified: Secondary | ICD-10-CM | POA: Insufficient documentation

## 2012-05-26 DIAGNOSIS — Z8719 Personal history of other diseases of the digestive system: Secondary | ICD-10-CM

## 2012-05-26 DIAGNOSIS — N390 Urinary tract infection, site not specified: Secondary | ICD-10-CM

## 2012-05-26 DIAGNOSIS — K5289 Other specified noninfective gastroenteritis and colitis: Secondary | ICD-10-CM

## 2012-05-26 LAB — POCT URINALYSIS DIPSTICK
Ketones, UA: NEGATIVE
Protein, UA: NEGATIVE
Spec Grav, UA: >=1.03
Urobilinogen, UA: 0.2

## 2012-05-26 LAB — CBC WITH DIFFERENTIAL/PLATELET
Basophils Relative: 0.7 % (ref 0.0–3.0)
Eosinophils Relative: 1.7 % (ref 0.0–5.0)
HCT: 45.3 % (ref 36.0–46.0)
Hemoglobin: 15.4 g/dL — ABNORMAL HIGH (ref 12.0–15.0)
Lymphocytes Relative: 17.6 % (ref 12.0–46.0)
Lymphs Abs: 1.2 10*3/uL (ref 0.7–4.0)
Monocytes Relative: 9.5 % (ref 3.0–12.0)
Neutro Abs: 4.7 10*3/uL (ref 1.4–7.7)
RBC: 5.08 Mil/uL (ref 3.87–5.11)
RDW: 12.7 % (ref 11.5–14.6)

## 2012-05-26 LAB — BASIC METABOLIC PANEL
BUN: 11 mg/dL (ref 6–23)
CO2: 31 mEq/L (ref 19–32)
Calcium: 9.7 mg/dL (ref 8.4–10.5)
Chloride: 101 mEq/L (ref 96–112)
Creatinine, Ser: 0.7 mg/dL (ref 0.4–1.2)
Glucose, Bld: 83 mg/dL (ref 70–99)

## 2012-05-26 NOTE — Addendum Note (Signed)
Addended by: Silvio Pate D on: 05/26/2012 04:31 PM   Modules accepted: Orders

## 2012-05-26 NOTE — Progress Notes (Signed)
  Subjective:    Patient ID: Kirsten Schroeder, female    DOB: [birth year], 57 y.o.   MRN: [id number]  HPI Pt here f/u ER and GI.  Pt went to ER with blood in iliostomy.  She was afraid she had a blockage but GI virus was going around school.  Hgb was good then .   She is here today she says because Dr Christella Hartigan wanted to make sure she did not develop a kidney infection.  Pt is doing great and has had no more blood and no other complaints.     Review of Systems As above---er and GI notes reviewed  Past Medical History  Diagnosis Date  . Hypertension   . Colitis     [place]  . GI bleed    Past Surgical History  Procedure Date  . Ileostomy 12/27/1988    Removal of Large Intestine---Tim Earlene Plater, Careers adviser  . Cholecystectomy [phone]   Current Outpatient Prescriptions on File Prior to Visit  Medication Sig Dispense Refill  . Calcium Carbonate-Vitamin D (CALCIUM + D PO) Take 1 tablet by mouth 2 (two) times daily.        . Flaxseed, Linseed, (FLAXSEED OIL) 1000 MG CAPS Take 1 capsule by mouth every other day.       . lisinopril-hydrochlorothiazide (ZESTORETIC) 10-12.5 MG per tablet Take 1 tablet by mouth daily.  90 tablet  3  . Multiple Vitamin (MULTIVITAMIN) tablet Take 1 tablet by mouth daily.         History  Substance Use Topics  . Smoking status: Never Smoker   . Smokeless tobacco: Never Used  . Alcohol Use: No       Objective:   Physical Exam BP 120/76  Pulse 75  Temp 98.2 F (36.8 C) (Oral)  Wt 152 lb 9.6 oz (69.219 kg)  SpO2 96% General appearance: alert, cooperative, appears stated age and no distress Lungs: clear to auscultation bilaterally Heart: S1, S2 normal Extremities: extremities normal, atraumatic, no cyanosis or edema       Assessment & Plan:

## 2012-05-26 NOTE — Assessment & Plan Note (Signed)
Ho bleed--- now resolved--- check labs and UA rto prn

## 2012-05-26 NOTE — Patient Instructions (Signed)
Gastrointestinal Bleeding Gastrointestinal (GI) bleeding means there is bleeding somewhere along the digestive tract, between the mouth and anus. CAUSES  There are many different problems that can cause GI bleeding. Possible causes include:  Esophagitis. This is inflammation, irritation, or swelling of the esophagus.  Hemorrhoids.These are veins that are full of blood (engorged) in the rectum. They cause pain, inflammation, and may bleed.  Anal fissures.These are areas of painful tearing which may bleed. They are often caused by passing hard stool.  Diverticulosis.These are pouches that form on the colon over time, with age, and may bleed significantly.  Diverticulitis.This is inflammation in areas with diverticulosis. It can cause pain, fever, and bloody stools, although bleeding is rare.  Polyps and cancer. Colon cancer often starts out as precancerous polyps.  Gastritis and ulcers.Bleeding from the upper gastrointestinal tract (near the stomach) may travel through the intestines and produce black, sometimes tarry, often bad smelling stools. In certain cases, if the bleeding is fast enough, the stools may not be black, but red. This condition may be life-threatening. SYMPTOMS   Vomiting bright red blood or material that looks like coffee grounds.  Bloody, black, or tarry stools. DIAGNOSIS  Your caregiver may diagnose your condition by taking your history and performing a physical exam. More tests may be needed, including:  X-rays and other imaging tests.  Esophagogastroduodenoscopy (EGD). This test uses a flexible, lighted tube to look at your esophagus, stomach, and small intestine.  Colonoscopy. This test uses a flexible, lighted tube to look at your colon. TREATMENT  Treatment depends on the cause of your bleeding.   For bleeding from the esophagus, stomach, small intestine, or colon, the caregiver doing your EGD or colonoscopy may be able to stop the bleeding as part of  the procedure.  Inflammation or infection of the colon can be treated with medicines.  Many rectal problems can be treated with creams, suppositories, or warm baths.  Surgery is sometimes needed.  Blood transfusions are sometimes needed if you have lost a lot of blood. If bleeding is slow, you may be allowed to go home. If there is a lot of bleeding, you will need to stay in the hospital for observation. HOME CARE INSTRUCTIONS   Take any medicines exactly as prescribed.  Keep your stools soft by eating foods that are high in fiber. These foods include whole grains, legumes, fruits, and vegetables. Prunes (1 to 3 a day) work well for many people.  Drink enough fluids to keep your urine clear or pale yellow.  Take sitz baths if advised. A sitz bath is when you sit in a bathtub with warm water for 10 to 15 minutes to soak, soothe, and cleanse the rectal area. SEEK IMMEDIATE MEDICAL CARE IF:   Your bleeding increases.  You feel lightheaded, weak, or you faint.  You have severe cramps in your back or abdomen.  You pass large blood clots in your stool.  Your problems are getting worse. MAKE SURE YOU:   Understand these instructions.  Will watch your condition.  Will get help right away if you are not doing well or get worse. Document Released: 05/07/2000 Document Revised: 08/02/2011 Document Reviewed: 04/19/2011 Exit[employer] Patient Information 2013 [employer].  

## 2012-05-30 LAB — URINE CULTURE

## 2012-08-30 ENCOUNTER — Ambulatory Visit: Payer: BC Managed Care – PPO | Admitting: Family Medicine

## 2012-10-04 ENCOUNTER — Encounter: Payer: Self-pay | Admitting: Family Medicine

## 2012-10-04 ENCOUNTER — Ambulatory Visit (INDEPENDENT_AMBULATORY_CARE_PROVIDER_SITE_OTHER): Payer: BC Managed Care – PPO | Admitting: Family Medicine

## 2012-10-04 VITALS — BP 110/68 | HR 113 | Temp 100.5°F | Wt 145.6 lb

## 2012-10-04 DIAGNOSIS — R197 Diarrhea, unspecified: Secondary | ICD-10-CM

## 2012-10-04 MED ORDER — HYOSCYAMINE SULFATE 0.125 MG SL SUBL: 0.1250 mg | SUBLINGUAL_TABLET | SUBLINGUAL | Status: DC | PRN

## 2012-10-04 NOTE — Progress Notes (Signed)
  Subjective:     Kirsten Schroeder is a 57 y.o. female who presents for evaluation of diarrhea. Onset of diarrhea was 7 days ago. Diarrhea is occurring approximately 5 times per day. Patient describes diarrhea as watery. Diarrhea has been associated with fever to 101.9 and household contacts with similar illness. Patient denies blood in stool, recent antibiotic use, recent camping, recent travel, significant abdominal pain, unintentional weight loss. Previous visits for diarrhea: none. Evaluation to date: none.  Treatment to date: none.  The following portions of the patient's history were reviewed and updated as appropriate: allergies, current medications, past family history, past medical history, past social history, past surgical history and problem list.  Review of Systems Pertinent items are noted in HPI.    Objective:    BP 110/68  Pulse 113  Temp(Src) 100.5 F (38.1 C) (Oral)  Wt 145 lb 9.6 oz (66.044 kg)  BMI 25.99 kg/m2  SpO2 95% General: alert, cooperative, appears stated age and no distress  Hydration:  well hydrated  Abdomen:    soft, non-tender; bowel sounds normal; no masses,  no organomegaly    Assessment:    Gastroenteritis, likely viral; moderate in severity   Plan:    Clear liquids for a few days. Discussed the appropriate management of diarrhea. Follow up as needed. OTC antidiarrheals may be used judiciously.

## 2012-10-04 NOTE — Patient Instructions (Signed)
Diarrhea Infections caused by germs (bacterial) or a virus commonly cause diarrhea. Your caregiver has determined that with time, rest and fluids, the diarrhea should improve. In general, eat normally while drinking more water than usual. Although water may prevent dehydration, it does not contain salt and minerals (electrolytes). Broths, weak tea without caffeine and oral rehydration solutions (ORS) replace fluids and electrolytes. Small amounts of fluids should be taken frequently. Large amounts at one time may not be tolerated. Plain water may be harmful in infants and the elderly. Oral rehydrating solutions (ORS) are available at pharmacies and grocery stores. ORS replace water and important electrolytes in proper proportions. Sports drinks are not as effective as ORS and may be harmful due to sugars worsening diarrhea.  ORS is especially recommended for use in children with diarrhea. As a general guideline for children, replace any new fluid losses from diarrhea and/or vomiting with ORS as follows:  If your child weighs 22 pounds or under (10 kg or less), give 60-120 mL ( -  cup or 2 - 4 ounces) of ORS for each episode of diarrheal stool or vomiting episode.  If your child weighs more than 22 pounds (more than 10 kgs), give 120-240 mL ( - 1 cup or 4 - 8 ounces) of ORS for each diarrheal stool or episode of vomiting.  While correcting for dehydration, children should eat normally. However, foods high in sugar should be avoided because this may worsen diarrhea. Large amounts of carbonated soft drinks, juice, gelatin desserts and other highly sugared drinks should be avoided.  After correction of dehydration, other liquids that are appealing to the child may be added. Children should drink small amounts of fluids frequently and fluids should be increased as tolerated. Children should drink enough fluids to keep urine clear or pale yellow.  Adults should eat normally while drinking more fluids than  usual. Drink small amounts of fluids frequently and increase as tolerated. Drink enough fluids to keep urine clear or pale yellow. Broths, weak decaffeinated tea, lemon lime soft drinks (allowed to go flat) and ORS replace fluids and electrolytes.  Avoid:  Carbonated drinks.  Juice.  Extremely hot or cold fluids.  Caffeine drinks.  Fatty, greasy foods.  Alcohol.  Tobacco.  Too much intake of anything at one time.  Gelatin desserts.  Probiotics are active cultures of beneficial bacteria. They may lessen the amount and number of diarrheal stools in adults. Probiotics can be found in yogurt with active cultures and in supplements.  Wash hands well to avoid spreading bacteria and virus.  Anti-diarrheal medications are not recommended for infants and children.  Only take over-the-counter or prescription medicines for pain, discomfort or fever as directed by your caregiver. Do not give aspirin to children because it may cause Reye's Syndrome.  For adults, ask your caregiver if you should continue all prescribed and over-the-counter medicines.  If your caregiver has given you a follow-up appointment, it is very important to keep that appointment. Not keeping the appointment could result in a chronic or permanent injury, and disability. If there is any problem keeping the appointment, you must call back to this facility for assistance. SEEK IMMEDIATE MEDICAL CARE IF:   You or your child is unable to keep fluids down or other symptoms or problems become worse in spite of treatment.  Vomiting or diarrhea develops and becomes persistent.  There is vomiting of blood or bile (green material).  There is blood in the stool or the stools are black and   tarry.  There is no urine output in 6-8 hours or there is only a small amount of very dark urine.  Abdominal pain develops, increases or localizes.  You have a fever.  Your baby is older than 3 months with a rectal temperature of 102 F  (38.9 C) or higher.  Your baby is 3 months old or younger with a rectal temperature of 100.4 F (38 C) or higher.  You or your child develops excessive weakness, dizziness, fainting or extreme thirst.  You or your child develops a rash, stiff neck, severe headache or become irritable or sleepy and difficult to awaken. MAKE SURE YOU:   Understand these instructions.  Will watch your condition.  Will get help right away if you are not doing well or get worse. Document Released: 04/30/2002 Document Revised: 08/02/2011 Document Reviewed: 03/17/2009 [employer] Patient Information 2013 [employer].  

## 2012-10-06 LAB — CLOSTRIDIUM DIFFICILE EIA: CDIFTX: NEGATIVE

## 2012-10-09 ENCOUNTER — Encounter: Payer: Self-pay | Admitting: *Deleted

## 2012-10-09 LAB — STOOL CULTURE

## 2012-11-09 ENCOUNTER — Encounter: Payer: Self-pay | Admitting: Family Medicine

## 2012-11-09 ENCOUNTER — Ambulatory Visit (INDEPENDENT_AMBULATORY_CARE_PROVIDER_SITE_OTHER): Payer: BC Managed Care – PPO | Admitting: Family Medicine

## 2012-11-09 VITALS — BP 108/68 | HR 90 | Temp 98.3°F | Ht 62.75 in | Wt 147.0 lb

## 2012-11-09 DIAGNOSIS — K529 Noninfective gastroenteritis and colitis, unspecified: Secondary | ICD-10-CM

## 2012-11-09 DIAGNOSIS — Z0000 Encounter for general adult medical examination without abnormal findings: Secondary | ICD-10-CM

## 2012-11-09 DIAGNOSIS — I10 Essential (primary) hypertension: Secondary | ICD-10-CM

## 2012-11-09 DIAGNOSIS — K5289 Other specified noninfective gastroenteritis and colitis: Secondary | ICD-10-CM

## 2012-11-09 LAB — CBC WITH DIFFERENTIAL/PLATELET
Basophils Relative: 0.6 % (ref 0.0–3.0)
Eosinophils Relative: 1.6 % (ref 0.0–5.0)
HCT: 39.5 % (ref 36.0–46.0)
Monocytes Relative: 7.9 % (ref 3.0–12.0)
Neutrophils Relative %: 76.7 % (ref 43.0–77.0)
Platelets: 296 10*3/uL (ref 150.0–400.0)
RBC: 4.37 Mil/uL (ref 3.87–5.11)
WBC: 8.9 10*3/uL (ref 4.5–10.5)

## 2012-11-09 LAB — BASIC METABOLIC PANEL
BUN: 10 mg/dL (ref 6–23)
Chloride: 104 mEq/L (ref 96–112)
Creatinine, Ser: 0.6 mg/dL (ref 0.4–1.2)
GFR: 105.31 mL/min (ref >60.00)
Potassium: 3.8 mEq/L (ref 3.5–5.1)

## 2012-11-09 LAB — LDL CHOLESTEROL, DIRECT: Direct LDL: 108.2 mg/dL

## 2012-11-09 LAB — LIPID PANEL: Total CHOL/HDL Ratio: 5

## 2012-11-09 LAB — HEPATIC FUNCTION PANEL
ALT: 11 U/L (ref 0–35)
AST: 17 U/L (ref 0–37)
Bilirubin, Direct: 0 mg/dL (ref 0.0–0.3)
Total Bilirubin: 0.5 mg/dL (ref 0.3–1.2)
Total Protein: 7.9 g/dL (ref 6.0–8.3)

## 2012-11-09 LAB — TSH: TSH: 0.41 u[IU]/mL (ref 0.35–5.50)

## 2012-11-09 MED ORDER — LISINOPRIL-HYDROCHLOROTHIAZIDE 10-12.5 MG PO TABS: 1.0000 | ORAL_TABLET | Freq: Every day | ORAL | Status: DC

## 2012-11-09 NOTE — Assessment & Plan Note (Signed)
Improving Per GI

## 2012-11-09 NOTE — Assessment & Plan Note (Signed)
Stable con't meds 

## 2012-11-09 NOTE — Patient Instructions (Addendum)
Preventive Care for Adults, Female A healthy lifestyle and preventive care can promote health and wellness. Preventive health guidelines for women include the following key practices.  A routine yearly physical is a good way to check with your caregiver about your health and preventive screening. It is a chance to share any concerns and updates on your health, and to receive a thorough exam.  Visit your dentist for a routine exam and preventive care every 6 months. Brush your teeth twice a day and floss once a day. Good oral hygiene prevents tooth decay and gum disease.  The frequency of eye exams is based on your age, health, family medical history, use of contact lenses, and other factors. Follow your caregiver's recommendations for frequency of eye exams.  Eat a healthy diet. Foods like vegetables, fruits, whole grains, low-fat dairy products, and lean protein foods contain the nutrients you need without too many calories. Decrease your intake of foods high in solid fats, added sugars, and salt. Eat the right amount of calories for you.Get information about a proper diet from your caregiver, if necessary.  Regular physical exercise is one of the most important things you can do for your health. Most adults should get at least 150 minutes of moderate-intensity exercise (any activity that increases your heart rate and causes you to sweat) each week. In addition, most adults need muscle-strengthening exercises on 2 or more days a week.  Maintain a healthy weight. The body mass index (BMI) is a screening tool to identify possible weight problems. It provides an estimate of body fat based on height and weight. Your caregiver can help determine your BMI, and can help you achieve or maintain a healthy weight.For adults 20 years and older:  A BMI below 18.5 is considered underweight.  A BMI of 18.5 to 24.9 is normal.  A BMI of 25 to 29.9 is considered overweight.  A BMI of 30 and above is  considered obese.  Maintain normal blood lipids and cholesterol levels by exercising and minimizing your intake of saturated fat. Eat a balanced diet with plenty of fruit and vegetables. Blood tests for lipids and cholesterol should begin at age 20 and be repeated every 5 years. If your lipid or cholesterol levels are high, you are over 50, or you are at high risk for heart disease, you may need your cholesterol levels checked more frequently.Ongoing high lipid and cholesterol levels should be treated with medicines if diet and exercise are not effective.  If you smoke, find out from your caregiver how to quit. If you do not use tobacco, do not start.  If you are pregnant, do not drink alcohol. If you are breastfeeding, be very cautious about drinking alcohol. If you are not pregnant and choose to drink alcohol, do not exceed 1 drink per day. One drink is considered to be 12 ounces (355 mL) of beer, 5 ounces (148 mL) of wine, or 1.5 ounces (44 mL) of liquor.  Avoid use of street drugs. Do not share needles with anyone. Ask for help if you need support or instructions about stopping the use of drugs.  High blood pressure causes heart disease and increases the risk of stroke. Your blood pressure should be checked at least every 1 to 2 years. Ongoing high blood pressure should be treated with medicines if weight loss and exercise are not effective.  If you are 55 to 57 years old, ask your caregiver if you should take aspirin to prevent strokes.  Diabetes   screening involves taking a blood sample to check your fasting blood sugar level. This should be done once every 3 years, after age 45, if you are within normal weight and without risk factors for diabetes. Testing should be considered at a younger age or be carried out more frequently if you are overweight and have at least 1 risk factor for diabetes.  Breast cancer screening is essential preventive care for women. You should practice "breast  self-awareness." This means understanding the normal appearance and feel of your breasts and may include breast self-examination. Any changes detected, no matter how small, should be reported to a caregiver. Women in their 20s and 30s should have a clinical breast exam (CBE) by a caregiver as part of a regular health exam every 1 to 3 years. After age 40, women should have a CBE every year. Starting at age 40, women should consider having a mammography (breast X-ray test) every year. Women who have a family history of breast cancer should talk to their caregiver about genetic screening. Women at a high risk of breast cancer should talk to their caregivers about having magnetic resonance imaging (MRI) and a mammography every year.  The Pap test is a screening test for cervical cancer. A Pap test can show cell changes on the cervix that might become cervical cancer if left untreated. A Pap test is a procedure in which cells are obtained and examined from the lower end of the uterus (cervix).  Women should have a Pap test starting at age 21.  Between ages 21 and 29, Pap tests should be repeated every 2 years.  Beginning at age 30, you should have a Pap test every 3 years as long as the past 3 Pap tests have been normal.  Some women have medical problems that increase the chance of getting cervical cancer. Talk to your caregiver about these problems. It is especially important to talk to your caregiver if a new problem develops soon after your last Pap test. In these cases, your caregiver may recommend more frequent screening and Pap tests.  The above recommendations are the same for women who have or have not gotten the vaccine for human papillomavirus (HPV).  If you had a hysterectomy for a problem that was not cancer or a condition that could lead to cancer, then you no longer need Pap tests. Even if you no longer need a Pap test, a regular exam is a good idea to make sure no other problems are  starting.  If you are between ages 65 and 70, and you have had normal Pap tests going back 10 years, you no longer need Pap tests. Even if you no longer need a Pap test, a regular exam is a good idea to make sure no other problems are starting.  If you have had past treatment for cervical cancer or a condition that could lead to cancer, you need Pap tests and screening for cancer for at least 20 years after your treatment.  If Pap tests have been discontinued, risk factors (such as a new sexual partner) need to be reassessed to determine if screening should be resumed.  The HPV test is an additional test that may be used for cervical cancer screening. The HPV test looks for the virus that can cause the cell changes on the cervix. The cells collected during the Pap test can be tested for HPV. The HPV test could be used to screen women aged 30 years and older, and should   be used in women of any age who have unclear Pap test results. After the age of 30, women should have HPV testing at the same frequency as a Pap test.  Colorectal cancer can be detected and often prevented. Most routine colorectal cancer screening begins at the age of 50 and continues through age 75. However, your caregiver may recommend screening at an earlier age if you have risk factors for colon cancer. On a yearly basis, your caregiver may provide home test kits to check for hidden blood in the stool. Use of a small camera at the end of a tube, to directly examine the colon (sigmoidoscopy or colonoscopy), can detect the earliest forms of colorectal cancer. Talk to your caregiver about this at age 50, when routine screening begins. Direct examination of the colon should be repeated every 5 to 10 years through age 75, unless early forms of pre-cancerous polyps or small growths are found.  Hepatitis C blood testing is recommended for all people born from 1945 through 1965 and any individual with known risks for hepatitis C.  Practice  safe sex. Use condoms and avoid high-risk sexual practices to reduce the spread of sexually transmitted infections (STIs). STIs include gonorrhea, chlamydia, syphilis, trichomonas, herpes, HPV, and human immunodeficiency virus (HIV). Herpes, HIV, and HPV are viral illnesses that have no cure. They can result in disability, cancer, and death. Sexually active women aged 25 and younger should be checked for chlamydia. Older women with new or multiple partners should also be tested for chlamydia. Testing for other STIs is recommended if you are sexually active and at increased risk.  Osteoporosis is a disease in which the bones lose minerals and strength with aging. This can result in serious bone fractures. The risk of osteoporosis can be identified using a bone density scan. Women ages 65 and over and women at risk for fractures or osteoporosis should discuss screening with their caregivers. Ask your caregiver whether you should take a calcium supplement or vitamin D to reduce the rate of osteoporosis.  Menopause can be associated with physical symptoms and risks. Hormone replacement therapy is available to decrease symptoms and risks. You should talk to your caregiver about whether hormone replacement therapy is right for you.  Use sunscreen with sun protection factor (SPF) of 30 or more. Apply sunscreen liberally and repeatedly throughout the day. You should seek shade when your shadow is shorter than you. Protect yourself by wearing long sleeves, pants, a wide-brimmed hat, and sunglasses year round, whenever you are outdoors.  Once a month, do a whole body skin exam, using a mirror to look at the skin on your back. Notify your caregiver of new moles, moles that have irregular borders, moles that are larger than a pencil eraser, or moles that have changed in shape or color.  Stay current with required immunizations.  Influenza. You need a dose every fall (or winter). The composition of the flu vaccine  changes each year, so being vaccinated once is not enough.  Pneumococcal polysaccharide. You need 1 to 2 doses if you smoke cigarettes or if you have certain chronic medical conditions. You need 1 dose at age 65 (or older) if you have never been vaccinated.  Tetanus, diphtheria, pertussis (Tdap, Td). Get 1 dose of Tdap vaccine if you are younger than age 65, are over 65 and have contact with an infant, are a healthcare worker, are pregnant, or simply want to be protected from whooping cough. After that, you need a Td   booster dose every 10 years. Consult your caregiver if you have not had at least 3 tetanus and diphtheria-containing shots sometime in your life or have a deep or dirty wound.  HPV. You need this vaccine if you are a woman age 26 or younger. The vaccine is given in 3 doses over 6 months.  Measles, mumps, rubella (MMR). You need at least 1 dose of MMR if you were born in 1957 or later. You may also need a second dose.  Meningococcal. If you are age 19 to 21 and a first-year college student living in a residence hall, or have one of several medical conditions, you need to get vaccinated against meningococcal disease. You may also need additional booster doses.  Zoster (shingles). If you are age 60 or older, you should get this vaccine.  Varicella (chickenpox). If you have never had chickenpox or you were vaccinated but received only 1 dose, talk to your caregiver to find out if you need this vaccine.  Hepatitis A. You need this vaccine if you have a specific risk factor for hepatitis A virus infection or you simply wish to be protected from this disease. The vaccine is usually given as 2 doses, 6 to 18 months apart.  Hepatitis B. You need this vaccine if you have a specific risk factor for hepatitis B virus infection or you simply wish to be protected from this disease. The vaccine is given in 3 doses, usually over 6 months. Preventive Services / Frequency Ages 19 to 39  Blood  pressure check.** / Every 1 to 2 years.  Lipid and cholesterol check.** / Every 5 years beginning at age 20.  Clinical breast exam.** / Every 3 years for women in their 20s and 30s.  Pap test.** / Every 2 years from ages 21 through 29. Every 3 years starting at age 30 through age 65 or 70 with a history of 3 consecutive normal Pap tests.  HPV screening.** / Every 3 years from ages 30 through ages 65 to 70 with a history of 3 consecutive normal Pap tests.  Hepatitis C blood test.** / For any individual with known risks for hepatitis C.  Skin self-exam. / Monthly.  Influenza immunization.** / Every year.  Pneumococcal polysaccharide immunization.** / 1 to 2 doses if you smoke cigarettes or if you have certain chronic medical conditions.  Tetanus, diphtheria, pertussis (Tdap, Td) immunization. / A one-time dose of Tdap vaccine. After that, you need a Td booster dose every 10 years.  HPV immunization. / 3 doses over 6 months, if you are 26 and younger.  Measles, mumps, rubella (MMR) immunization. / You need at least 1 dose of MMR if you were born in 1957 or later. You may also need a second dose.  Meningococcal immunization. / 1 dose if you are age 19 to 21 and a first-year college student living in a residence hall, or have one of several medical conditions, you need to get vaccinated against meningococcal disease. You may also need additional booster doses.  Varicella immunization.** / Consult your caregiver.  Hepatitis A immunization.** / Consult your caregiver. 2 doses, 6 to 18 months apart.  Hepatitis B immunization.** / Consult your caregiver. 3 doses usually over 6 months. Ages 40 to 64  Blood pressure check.** / Every 1 to 2 years.  Lipid and cholesterol check.** / Every 5 years beginning at age 20.  Clinical breast exam.** / Every year after age 40.  Mammogram.** / Every year beginning at age 40   and continuing for as long as you are in good health. Consult with your  caregiver.  Pap test.** / Every 3 years starting at age 30 through age 65 or 70 with a history of 3 consecutive normal Pap tests.  HPV screening.** / Every 3 years from ages 30 through ages 65 to 70 with a history of 3 consecutive normal Pap tests.  Fecal occult blood test (FOBT) of stool. / Every year beginning at age 50 and continuing until age 75. You may not need to do this test if you get a colonoscopy every 10 years.  Flexible sigmoidoscopy or colonoscopy.** / Every 5 years for a flexible sigmoidoscopy or every 10 years for a colonoscopy beginning at age 50 and continuing until age 75.  Hepatitis C blood test.** / For all people born from 1945 through 1965 and any individual with known risks for hepatitis C.  Skin self-exam. / Monthly.  Influenza immunization.** / Every year.  Pneumococcal polysaccharide immunization.** / 1 to 2 doses if you smoke cigarettes or if you have certain chronic medical conditions.  Tetanus, diphtheria, pertussis (Tdap, Td) immunization.** / A one-time dose of Tdap vaccine. After that, you need a Td booster dose every 10 years.  Measles, mumps, rubella (MMR) immunization. / You need at least 1 dose of MMR if you were born in 1957 or later. You may also need a second dose.  Varicella immunization.** / Consult your caregiver.  Meningococcal immunization.** / Consult your caregiver.  Hepatitis A immunization.** / Consult your caregiver. 2 doses, 6 to 18 months apart.  Hepatitis B immunization.** / Consult your caregiver. 3 doses, usually over 6 months. Ages 65 and over  Blood pressure check.** / Every 1 to 2 years.  Lipid and cholesterol check.** / Every 5 years beginning at age 20.  Clinical breast exam.** / Every year after age 40.  Mammogram.** / Every year beginning at age 40 and continuing for as long as you are in good health. Consult with your caregiver.  Pap test.** / Every 3 years starting at age 30 through age 65 or 70 with a 3  consecutive normal Pap tests. Testing can be stopped between 65 and 70 with 3 consecutive normal Pap tests and no abnormal Pap or HPV tests in the past 10 years.  HPV screening.** / Every 3 years from ages 30 through ages 65 or 70 with a history of 3 consecutive normal Pap tests. Testing can be stopped between 65 and 70 with 3 consecutive normal Pap tests and no abnormal Pap or HPV tests in the past 10 years.  Fecal occult blood test (FOBT) of stool. / Every year beginning at age 50 and continuing until age 75. You may not need to do this test if you get a colonoscopy every 10 years.  Flexible sigmoidoscopy or colonoscopy.** / Every 5 years for a flexible sigmoidoscopy or every 10 years for a colonoscopy beginning at age 50 and continuing until age 75.  Hepatitis C blood test.** / For all people born from 1945 through 1965 and any individual with known risks for hepatitis C.  Osteoporosis screening.** / A one-time screening for women ages 65 and over and women at risk for fractures or osteoporosis.  Skin self-exam. / Monthly.  Influenza immunization.** / Every year.  Pneumococcal polysaccharide immunization.** / 1 dose at age 65 (or older) if you have never been vaccinated.  Tetanus, diphtheria, pertussis (Tdap, Td) immunization. / A one-time dose of Tdap vaccine if you are over   65 and have contact with an infant, are a healthcare worker, or simply want to be protected from whooping cough. After that, you need a Td booster dose every 10 years.  Varicella immunization.** / Consult your caregiver.  Meningococcal immunization.** / Consult your caregiver.  Hepatitis A immunization.** / Consult your caregiver. 2 doses, 6 to 18 months apart.  Hepatitis B immunization.** / Check with your caregiver. 3 doses, usually over 6 months. ** Family history and personal history of risk and conditions may change your caregiver's recommendations. Document Released: 07/06/2001 Document Revised: 08/02/2011  Document Reviewed: 10/05/2010 [employer] Patient Information 2014 [employer].  

## 2012-11-09 NOTE — Progress Notes (Signed)
Subjective:     Kirsten Schroeder is a 57 y.o. female and is here for a comprehensive physical exam. The patient reports no problems.  History   Social History  . Marital Status: Divorced    Spouse Name: N/A    Number of Children: 2  . 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 Smoking status: Never Smoker   . Smokeless tobacco: Never Used  . Alcohol Use: No  . Drug Use: No  . Sexually Active: No   Other Topics Concern  . Not on file   Social History Narrative   Gets reg exercise   Health Maintenance  Topic Date Due  . Influenza Vaccine  01/22/2013  . Pap Smear  04/10/2013  . Mammogram  03/27/2014  . Colonoscopy  11/08/2020  . Tetanus/tdap  11/15/2021    The following portions of the patient's history were reviewed and updated as appropriate:  She  has a past medical history of Hypertension; Colitis; and GI bleed. She  does not have any pertinent problems on file. She  has past surgical history that includes Ileostomy (12/27/1988) and Cholecystectomy (1610-9604). Her family history includes Arthritis in her mother; Diabetes in her brother and father; and Hypertension in her brother, father, and mother. She  reports that she has never smoked. She has never used smokeless tobacco. She reports that she does not drink alcohol or use illicit drugs. She has a current medication list which includes the following prescription(s): calcium carbonate-vitamin d, lisinopril-hydrochlorothiazide, and multivitamin. Current Outpatient Prescriptions on File Prior to Visit  Medication Sig Dispense Refill  . Calcium Carbonate-Vitamin D (CALCIUM + D PO) Take 1 tablet by mouth 2 (two) times daily.        Marland Kitchen lisinopril-hydrochlorothiazide (ZESTORETIC) 10-12.5 MG per tablet Take 1 tablet by mouth daily.  90 tablet  3  . Multiple Vitamin (MULTIVITAMIN) tablet Take 1 tablet by mouth daily.         No current  facility-administered medications on file prior to visit.   She is allergic to penicillins..  Review of Systems Review of Systems  Constitutional: Negative for activity change, appetite change and fatigue.  HENT: Negative for hearing loss, congestion, tinnitus and ear discharge.  dentist q63m Eyes: Negative for visual disturbance (see optho q1y -- vision corrected to 20/20 with glasses).  Respiratory: Negative for cough, chest tightness and shortness of breath.   Cardiovascular: Negative for chest pain, palpitations and leg swelling.  Gastrointestinal: Negative for abdominal pain, diarrhea, constipation and abdominal distention.  Genitourinary: Negative for urgency, frequency, decreased urine volume and difficulty urinating.  Musculoskeletal: Negative for back pain, arthralgias and gait problem.  Skin: Negative for color change, pallor and rash.  Neurological: Negative for dizziness, light-headedness, numbness and headaches.  Hematological: Negative for adenopathy. Does not bruise/bleed easily.  Psychiatric/Behavioral: Negative for suicidal ideas, confusion, sleep disturbance, self-injury, dysphoric mood, decreased concentration and agitation.       Objective:    BP 108/68  Pulse 90  Temp(Src) 98.3 F (36.8 C) (Oral)  Ht 5' 2.75" (1.594 m)  Wt 147 lb (66.679 kg)  BMI 26.24 kg/m2  SpO2 96% General appearance: alert, cooperative, appears stated age and no distress Head: Normocephalic, without obvious abnormality, atraumatic Eyes: negative findings: lids and lashes normal, conjunctivae and sclerae normal and pupils equal, round, reactive to light and accomodation Ears: normal TM's and external ear canals both ears Nose: Nares normal.  Septum midline. Mucosa normal. No drainage or sinus tenderness. Throat: lips, mucosa, and tongue normal; teeth and gums normal Neck: no adenopathy, no carotid bruit, no JVD, supple, symmetrical, trachea midline and thyroid not enlarged, symmetric, no  tenderness/mass/nodules Back: symmetric, no curvature. ROM normal. No CVA tenderness. Lungs: clear to auscultation bilaterally Breasts: gyn Heart: S1, S2 normal Abdomen: soft, non-tender; bowel sounds normal; no masses,  no organomegaly Pelvic: deferred--gyn Extremities: extremities normal, atraumatic, no cyanosis or edema Pulses: 2+ and symmetric Skin: Skin color, texture, turgor normal. No rashes or lesions Lymph nodes: Cervical, supraclavicular, and axillary nodes normal. Neurologic: Alert and oriented X 3, normal strength and tone. Normal symmetric reflexes. Normal coordination and gait Psych-- no anxiety, no depression      Assessment:    Healthy female exam.      Plan:     ghm utd  check labs See After Visit Summary for Counseling Recommendations

## 2012-11-10 LAB — POCT URINALYSIS DIPSTICK
Bilirubin, UA: NEGATIVE
Glucose, UA: NEGATIVE
Leukocytes, UA: NEGATIVE
Nitrite, UA: NEGATIVE

## 2012-11-16 ENCOUNTER — Other Ambulatory Visit: Payer: Self-pay

## 2012-11-16 MED ORDER — FENOFIBRATE MICRONIZED 90 MG PO CAPS: 1.0000 | ORAL_CAPSULE | Freq: Every day | ORAL | Status: DC

## 2012-11-23 ENCOUNTER — Telehealth: Payer: Self-pay | Admitting: *Deleted

## 2012-11-23 NOTE — Telephone Encounter (Signed)
Did she use card?

## 2012-11-23 NOTE — Telephone Encounter (Signed)
Pt left msg on triage stating she went to her pharmacy to pick up the Antara 90mg  & it was $64. Pt states that she can't afford this & would like something else called into her pharmacy. Please advise.

## 2012-11-23 NOTE — Telephone Encounter (Signed)
I called pharmacy and they added the card and the medication has 0 co pay. Patient has been made aware.     KP

## 2012-12-01 ENCOUNTER — Telehealth: Payer: Self-pay | Admitting: Family Medicine

## 2012-12-01 NOTE — Telephone Encounter (Signed)
Spoke with patient and she will call back on Monday and let us know if symptoms have improved      KP

## 2012-12-01 NOTE — Telephone Encounter (Signed)
Stop the antara and let us know if the symptoms get better

## 2012-12-01 NOTE — Telephone Encounter (Signed)
Patient Information:  Caller Name: Taran  Phone: [phone]  Patient: Kirsten, Schroeder  Gender: Female  DOB: 11/25/1955  Age: 57 Years  PCP: Lelon Perla.  Office Follow Up:  Does the office need to follow up with this patient?: Yes  Instructions For The Office: PLEASE ADVISE ASAP ON CONTINUING WITH ANTARA. SHE STARTED LAST WEEK AND IS NOW HAVING JOINT PAIN EVERYWHERE AND CANNOT WALK AND RAISE ARMS NORMALLY.  RN Note:  She is concerned about starting the Antara 11/23/2012, whole body has joint aches and getting worse. Is taking the medication at 21:00 with no food. Please call and advise.  Symptoms  Reason For Call & Symptoms: Antara was started  11/23/2012  started medication and cannot sleep, can't raise arms well, joint pain, knees, ankles, thumbs, right knee swollen visibly and cannot even walk.  Reviewed Health History In EMR: Yes  Reviewed Medications In EMR: Yes  Reviewed Allergies In EMR: Yes  Reviewed Surgeries / Procedures: Yes  Date of Onset of Symptoms: 11/29/2012  Guideline(s) Used:  No Protocol Available - Sick Adult  Disposition Per Guideline:   Discuss with PCP and Callback by Nurse Today  Reason For Disposition Reached:   Nursing judgment  Advice Given:  N/A  Patient Will Follow Care Advice:  YES

## 2012-12-04 ENCOUNTER — Telehealth: Payer: Self-pay | Admitting: Family Medicine

## 2012-12-04 NOTE — Telephone Encounter (Signed)
Discussed with patient and she agreed to do so and will follow up in 3 mos

## 2012-12-04 NOTE — Telephone Encounter (Signed)
Caller: Kirsten Schroeder/Patient; Phone: [phone]; Reason for Call: Had medication side effect to Antara with joint and muscle pain, right leg swelling.  Dr Laury Axon instructed pt to call 12/04/12 with an update on status.  Pt states she is still tired, but swelling has went down a lot but not completely, joint/muscle pain not completely gone but [facility] better.  Going to try to start going on walks tomorrow morning again to get back in her routine.  Overall feeling much better.

## 2012-12-04 NOTE — Telephone Encounter (Signed)
Take fish oil or flaxseed oil 4 g daily divided--- and we will recheck labs in 3 months

## 2013-01-05 ENCOUNTER — Ambulatory Visit (INDEPENDENT_AMBULATORY_CARE_PROVIDER_SITE_OTHER): Payer: BC Managed Care – PPO | Admitting: Family Medicine

## 2013-01-05 VITALS — HR 96 | Temp 101.0°F

## 2013-01-05 DIAGNOSIS — M79604 Pain in right leg: Secondary | ICD-10-CM

## 2013-01-05 DIAGNOSIS — R319 Hematuria, unspecified: Secondary | ICD-10-CM

## 2013-01-05 DIAGNOSIS — R112 Nausea with vomiting, unspecified: Secondary | ICD-10-CM

## 2013-01-05 DIAGNOSIS — M79609 Pain in unspecified limb: Secondary | ICD-10-CM

## 2013-01-05 LAB — CBC WITH DIFFERENTIAL/PLATELET
Basophils Absolute: 0 10*3/uL (ref 0.0–0.1)
Basophils Relative: 0 % (ref 0–1)
Hemoglobin: 12.6 g/dL (ref 12.0–15.0)
MCHC: 34.9 g/dL (ref 30.0–36.0)
Monocytes Relative: 7 % (ref 3–12)
Neutro Abs: 24.5 10*3/uL — ABNORMAL HIGH (ref 1.7–7.7)
Neutrophils Relative %: 91 % — ABNORMAL HIGH (ref 43–77)
RDW: 13.3 % (ref 11.5–15.5)
WBC: 27.1 10*3/uL — ABNORMAL HIGH (ref 4.0–10.5)

## 2013-01-05 MED ORDER — ONDANSETRON 4 MG PO TBDP: 4.0000 mg | ORAL_TABLET | Freq: Three times a day (TID) | ORAL | Status: DC | PRN

## 2013-01-06 ENCOUNTER — Encounter: Payer: Self-pay | Admitting: Family Medicine

## 2013-01-06 DIAGNOSIS — M79606 Pain in leg, unspecified: Secondary | ICD-10-CM | POA: Insufficient documentation

## 2013-01-06 LAB — HEPATIC FUNCTION PANEL
Bilirubin, Direct: 0.7 mg/dL — ABNORMAL HIGH (ref 0.0–0.3)
Total Bilirubin: 2.4 mg/dL — ABNORMAL HIGH (ref 0.3–1.2)

## 2013-01-06 LAB — BASIC METABOLIC PANEL
CO2: 25 mEq/L (ref 19–32)
Calcium: 9.6 mg/dL (ref 8.4–10.5)
Creat: 0.77 mg/dL (ref 0.50–1.10)

## 2013-01-06 NOTE — Progress Notes (Signed)
  Subjective:     Kirsten Schroeder is a 57 y.o. female who presents for evaluation of nausea and vomiting. Onset of symptoms was today. Patient describes nausea as moderate. Vomiting has occurred several times over the past 6 hours. Vomitus is described as normal gastric contents. Symptoms have been associated with fever to 101. Patient denies hematemesis and melena. Symptoms have progressed to a point and plateaued. Evaluation to date has been none. Treatment to date has been none.   Pt also c/o R thigh pain -- muscle aches since  Being on fenofibrate.    The following portions of the patient's history were reviewed and updated as appropriate: allergies, current medications, past family history, past medical history, past social history, past surgical history and problem list.  Review of Systems Pertinent items are noted in HPI.   Objective:    Pulse 96  Temp(Src) 101 F (38.3 C) (Oral)  SpO2 100% General appearance: alert, cooperative, appears stated age and no distress Abdomen: soft, non-tender; bowel sounds normal; no masses,  no organomegaly  Ext---no calf pain, no swelling   Assessment:    Nausea and vomiting   Plan:    Antiemetics per medication orders. Dietary guidelines discussed. Labs per orders. pt instructed to go to ER if symptoms persist

## 2013-01-06 NOTE — Assessment & Plan Note (Signed)
Stop fenofibrate to see if pain subsides

## 2013-01-06 NOTE — Patient Instructions (Signed)

## 2013-01-08 LAB — POCT URINALYSIS DIPSTICK
Ketones, UA: NEGATIVE
Protein, UA: NEGATIVE
Urobilinogen, UA: 0.2
pH, UA: 7.5

## 2013-01-08 NOTE — Addendum Note (Signed)
Addended by: Silvio Pate D on: 01/08/2013 04:37 PM   Modules accepted: Orders

## 2013-01-10 ENCOUNTER — Telehealth: Payer: Self-pay | Admitting: Family Medicine

## 2013-01-10 ENCOUNTER — Ambulatory Visit (HOSPITAL_COMMUNITY)
Admission: RE | Admit: 2013-01-10 | Discharge: 2013-01-10 | Disposition: A | Discharge status: Home / Self Care | Payer: BC Managed Care – PPO | Source: Ambulatory Visit | Attending: Family Medicine | Admitting: Family Medicine

## 2013-01-10 ENCOUNTER — Other Ambulatory Visit (INDEPENDENT_AMBULATORY_CARE_PROVIDER_SITE_OTHER): Payer: BC Managed Care – PPO

## 2013-01-10 ENCOUNTER — Emergency Department (HOSPITAL_COMMUNITY): Payer: BC Managed Care – PPO

## 2013-01-10 ENCOUNTER — Telehealth: Payer: Self-pay

## 2013-01-10 ENCOUNTER — Inpatient Hospital Stay (HOSPITAL_COMMUNITY)
Admission: EM | Admit: 2013-01-10 | Discharge: 2013-01-17 | DRG: 415 | Disposition: A | Discharge status: Home / Self Care | Payer: BC Managed Care – PPO | Attending: Internal Medicine | Admitting: Internal Medicine

## 2013-01-10 ENCOUNTER — Encounter (HOSPITAL_COMMUNITY): Payer: Self-pay | Admitting: *Deleted

## 2013-01-10 DIAGNOSIS — IMO0002 Reserved for concepts with insufficient information to code with codable children: Secondary | ICD-10-CM | POA: Diagnosis present

## 2013-01-10 DIAGNOSIS — C801 Malignant (primary) neoplasm, unspecified: Secondary | ICD-10-CM | POA: Diagnosis present

## 2013-01-10 DIAGNOSIS — N1330 Unspecified hydronephrosis: Secondary | ICD-10-CM | POA: Diagnosis present

## 2013-01-10 DIAGNOSIS — N390 Urinary tract infection, site not specified: Secondary | ICD-10-CM | POA: Diagnosis present

## 2013-01-10 DIAGNOSIS — Z9049 Acquired absence of other specified parts of digestive tract: Secondary | ICD-10-CM

## 2013-01-10 DIAGNOSIS — R599 Enlarged lymph nodes, unspecified: Secondary | ICD-10-CM | POA: Diagnosis present

## 2013-01-10 DIAGNOSIS — A4181 Sepsis due to Enterococcus: Secondary | ICD-10-CM | POA: Diagnosis present

## 2013-01-10 DIAGNOSIS — A409 Streptococcal sepsis, unspecified: Principal | ICD-10-CM | POA: Diagnosis present

## 2013-01-10 DIAGNOSIS — B952 Enterococcus as the cause of diseases classified elsewhere: Secondary | ICD-10-CM | POA: Diagnosis present

## 2013-01-10 DIAGNOSIS — K632 Fistula of intestine: Secondary | ICD-10-CM | POA: Diagnosis present

## 2013-01-10 DIAGNOSIS — D7289 Other specified disorders of white blood cells: Secondary | ICD-10-CM

## 2013-01-10 DIAGNOSIS — N12 Tubulo-interstitial nephritis, not specified as acute or chronic: Secondary | ICD-10-CM

## 2013-01-10 DIAGNOSIS — D473 Essential (hemorrhagic) thrombocythemia: Secondary | ICD-10-CM | POA: Diagnosis present

## 2013-01-10 DIAGNOSIS — A419 Sepsis, unspecified organism: Secondary | ICD-10-CM | POA: Diagnosis present

## 2013-01-10 DIAGNOSIS — C772 Secondary and unspecified malignant neoplasm of intra-abdominal lymph nodes: Secondary | ICD-10-CM | POA: Diagnosis present

## 2013-01-10 DIAGNOSIS — D72829 Elevated white blood cell count, unspecified: Secondary | ICD-10-CM | POA: Diagnosis present

## 2013-01-10 DIAGNOSIS — R109 Unspecified abdominal pain: Secondary | ICD-10-CM

## 2013-01-10 DIAGNOSIS — D649 Anemia, unspecified: Secondary | ICD-10-CM | POA: Diagnosis present

## 2013-01-10 DIAGNOSIS — E876 Hypokalemia: Secondary | ICD-10-CM | POA: Diagnosis present

## 2013-01-10 DIAGNOSIS — R971 Elevated cancer antigen 125 [CA 125]: Secondary | ICD-10-CM | POA: Diagnosis present

## 2013-01-10 DIAGNOSIS — Z880 Allergy status to penicillin: Secondary | ICD-10-CM

## 2013-01-10 DIAGNOSIS — Z79899 Other long term (current) drug therapy: Secondary | ICD-10-CM

## 2013-01-10 DIAGNOSIS — R1900 Intra-abdominal and pelvic swelling, mass and lump, unspecified site: Secondary | ICD-10-CM | POA: Diagnosis present

## 2013-01-10 DIAGNOSIS — R112 Nausea with vomiting, unspecified: Secondary | ICD-10-CM

## 2013-01-10 DIAGNOSIS — Z6825 Body mass index (BMI) 25.0-25.9, adult: Secondary | ICD-10-CM

## 2013-01-10 DIAGNOSIS — E871 Hypo-osmolality and hyponatremia: Secondary | ICD-10-CM | POA: Diagnosis present

## 2013-01-10 DIAGNOSIS — K7689 Other specified diseases of liver: Secondary | ICD-10-CM | POA: Diagnosis present

## 2013-01-10 DIAGNOSIS — I10 Essential (primary) hypertension: Secondary | ICD-10-CM | POA: Diagnosis present

## 2013-01-10 DIAGNOSIS — E43 Unspecified severe protein-calorie malnutrition: Secondary | ICD-10-CM | POA: Insufficient documentation

## 2013-01-10 LAB — HEPATIC FUNCTION PANEL
ALT: 13 U/L (ref 0–35)
Alkaline Phosphatase: 66 U/L (ref 39–117)
Bilirubin, Direct: 0.2 mg/dL (ref 0.0–0.3)
Total Protein: 7.8 g/dL (ref 6.0–8.3)

## 2013-01-10 LAB — URINE MICROSCOPIC-ADD ON

## 2013-01-10 LAB — URINALYSIS, ROUTINE W REFLEX MICROSCOPIC
Bilirubin Urine: NEGATIVE
Protein, ur: NEGATIVE mg/dL
Specific Gravity, Urine: 1.019 (ref 1.005–1.030)
Urobilinogen, UA: 0.2 mg/dL (ref 0.0–1.0)

## 2013-01-10 LAB — BASIC METABOLIC PANEL WITH GFR
BUN: 14 mg/dL (ref 6–23)
CO2: 26 meq/L (ref 19–32)
Calcium: 9.4 mg/dL (ref 8.4–10.5)
Chloride: 89 meq/L — ABNORMAL LOW (ref 96–112)
Creatinine, Ser: 0.74 mg/dL (ref 0.50–1.10)
GFR calc Af Amer: >90 mL/min (ref >90)
GFR calc non Af Amer: >90 mL/min (ref >90)
Glucose, Bld: 115 mg/dL — ABNORMAL HIGH (ref 70–99)
Potassium: 3.1 meq/L — ABNORMAL LOW (ref 3.5–5.1)
Sodium: 128 meq/L — ABNORMAL LOW (ref 135–145)

## 2013-01-10 LAB — PROTIME-INR: Prothrombin Time: 14.7 seconds (ref 11.6–15.2)

## 2013-01-10 LAB — CBC WITH DIFFERENTIAL/PLATELET
Basophils Relative: 0 % (ref 0.0–3.0)
Eosinophils Relative: 0.4 % (ref 0.0–5.0)
Lymphocytes Relative: 7.3 % — ABNORMAL LOW (ref 12.0–46.0)
Neutrophils Relative %: 82 % — ABNORMAL HIGH (ref 43.0–77.0)
RBC: 3.71 Mil/uL — ABNORMAL LOW (ref 3.87–5.11)
WBC: 15.2 10*3/uL — ABNORMAL HIGH (ref 4.5–10.5)

## 2013-01-10 MED ORDER — HEPARIN SODIUM (PORCINE) 5000 UNIT/ML IJ SOLN
5000.0000 [IU] | Freq: Three times a day (TID) | INTRAMUSCULAR | Status: DC
  Administered 2013-01-10 – 2013-01-17 (×17): 5000 [IU] via SUBCUTANEOUS
  Filled 2013-01-10 (×23): qty 1

## 2013-01-10 MED ORDER — SODIUM CHLORIDE 0.9 % IJ SOLN
3.0000 mL | Freq: Two times a day (BID) | INTRAMUSCULAR | Status: DC
  Administered 2013-01-12: 3 mL via INTRAVENOUS

## 2013-01-10 MED ORDER — POTASSIUM CHLORIDE CRYS ER 20 MEQ PO TBCR
40.0000 meq | EXTENDED_RELEASE_TABLET | Freq: Once | ORAL | Status: AC
  Administered 2013-01-10: 40 meq via ORAL
  Filled 2013-01-10: qty 2

## 2013-01-10 MED ORDER — SODIUM CHLORIDE 0.9 % IV SOLN: INTRAVENOUS | Status: DC

## 2013-01-10 MED ORDER — SODIUM CHLORIDE 0.9 % IV SOLN
INTRAVENOUS | Status: DC
  Administered 2013-01-10: 21:00:00 via INTRAVENOUS
  Administered 2013-01-11: 100 mL/h via INTRAVENOUS
  Administered 2013-01-12 – 2013-01-17 (×11): via INTRAVENOUS

## 2013-01-10 MED ORDER — ONDANSETRON 4 MG PO TBDP
4.0000 mg | ORAL_TABLET | Freq: Three times a day (TID) | ORAL | Status: DC | PRN
  Filled 2013-01-10: qty 1

## 2013-01-10 MED ORDER — VANCOMYCIN HCL IN DEXTROSE 750-5 MG/150ML-% IV SOLN
750.0000 mg | Freq: Two times a day (BID) | INTRAVENOUS | Status: DC
  Administered 2013-01-10 – 2013-01-13 (×6): 750 mg via INTRAVENOUS
  Filled 2013-01-10 (×6): qty 150

## 2013-01-10 NOTE — Telephone Encounter (Signed)
Patient is calling back for her blood work results and imaging results. Says that we told her we would call her today with these. Please advise.

## 2013-01-10 NOTE — ED Notes (Signed)
ROOM [id number] ASSIGNED@2006 

## 2013-01-10 NOTE — Telephone Encounter (Signed)
Message copied by Arnette Norris on Wed Jan 10, 2013  8:42 AM ------      Message from: Lelon Perla      Created: Tue Jan 09, 2013  7:04 PM       How is pt feeling ?Marland Kitchen  Wbc and bili is elevated.  Any abd pain.   We need to repeat cbcd, hep 288.8  790.4      If abd pain [place] abd to be done tomorrow.  01/10/13 ------

## 2013-01-10 NOTE — Progress Notes (Signed)
ANTIBIOTIC CONSULT NOTE - INITIAL  Pharmacy Consult for Vancomycin Indication: Pyelonephritis, stated cx growing Enterococcus  Allergies  Allergen Reactions  . Penicillins     REACTION: RASH   Patient Measurements:   TBW: 67 kg  Vital Signs: Temp: 98.6 F (37 C) (08/20 1838) Temp src: Oral (08/20 1649) BP: 97/48 mmHg (08/20 1838) Pulse Rate: 89 (08/20 1838) Intake/Output from previous day:   Intake/Output from this shift:    Labs:  Recent Labs  01/10/13 1135  WBC 15.2*  HGB 11.1*  PLT 453.0*   The CrCl is unknown because both a height and weight (above a minimum accepted value) are required for this calculation. No results found for this basename: VANCOTROUGH, Leodis Binet, VANCORANDOM, GENTTROUGH, GENTPEAK, GENTRANDOM, TOBRATROUGH, TOBRAPEAK, TOBRARND, AMIKACINPEAK, AMIKACINTROU, AMIKACIN,  in the last 72 hours   Microbiology: Recent Results (from the past 720 hour(s))  URINE CULTURE     Status: None   Collection Time    01/08/13  4:37 PM      Result Value Range Status   Colony Count 80,000 COLONIES/ML   Preliminary   Preliminary Report ENTEROCOCCUS SPECIES   Preliminary   Medical History: Past Medical History  Diagnosis Date  . Hypertension   . Colitis     [place]  . GI bleed    Medications:  Anti-infectives   Start     Dose/Rate Route Frequency Ordered Stop   01/10/13 2000  vancomycin (VANCOCIN) IVPB 750 mg/150 ml premix     750 mg 150 mL/hr over 60 Minutes Intravenous Every 12 hours 01/10/13 1957       Assessment: 83 yoF with ureteral obstruction, hydronephrosis, probable pyelonephritis. Urine culture from 8/18 with enterococcus. Hx of Crohn's disease s/p colectomy with ileostomy. Begin Vancomycin IV per pharmacy  CT with mass or abscess compressing R ureter. Plan stent placement  Goal of Therapy:  Vancomycin trough level 15-20 mcg/ml  Plan:   Vancomycin 750mg  q12  Aiming for higher trough until abscess ruled out.  Otho Bellows  PharmD Pager [phone] 01/10/2013, 8:10 PM

## 2013-01-10 NOTE — Progress Notes (Signed)
Utilization Review completed.  Cyerra Yim RN CM  

## 2013-01-10 NOTE — ED Notes (Addendum)
Sent from pcp, WBC count, and RUQ pain. Pain 6/10. Ultrasound done, Right-sided hydronephrosis found. Pt has ileostomy.

## 2013-01-10 NOTE — Telephone Encounter (Signed)
Line busy.   KP 

## 2013-01-10 NOTE — H&P (Signed)
[facility] History and Physical  Kirsten Schroeder WJX:[id number] DOB: 1956-02-24 DOA: 01/10/2013  Referring physician: ED PCP: Loreen Freud, DO   Chief Complaint: R flank pain  HPI: Kirsten Schroeder is a 57 y.o. female h/o UC s/p total colectomy with ileostomy in 1990 who presents to the ED.  On Friday she developed nausea and R sided abdominal pain.  Her PCP noted fever of 103 that day in the office, UA obtained on the 18th and the dip was unremarkable (cultures would grow out 80k CFU enterococcus, no sensitivities available at this time).  Bilirubin was also elevated at her PCPs office and she noted tea colored urine (this spontaneously resolved and bilirubin was normal today).  US done at PCPs office today showed R sided hydro and in light of her continuing symptoms she was referred to the ED.  In the ED CT scan of the patients abdomen and pelvis demonstrated a mass near the uterus as well as adenopathy, mass appears to be compressing the R ureter causing moderate R hydronephrosis.  Despite this the patient does not have much in the way of abdominal or flank pain right now she states.  Review of Systems: 12 systems reviewed and otherwise negative.  Past Medical History  Diagnosis Date  . Hypertension   . Colitis     [place]  . GI bleed    Past Surgical History  Procedure Laterality Date  . Ileostomy  12/27/1988    Removal of Large Intestine---Tim Earlene Plater, Careers adviser  . Cholecystectomy  [phone]   Social History:  reports that she has never smoked. She has never used smokeless tobacco. She reports that she does not drink alcohol or use illicit drugs.   Allergies  Allergen Reactions  . Penicillins     REACTION: RASH    Family History  Problem Relation Age of Onset  . Hypertension Mother   . Arthritis Mother   . Hypertension Father   . Diabetes Father   . Hypertension Brother   . Diabetes Brother     Prior to Admission medications   Medication Sig Start Date End Date Taking?  Authorizing Provider  Calcium Carbonate-Vitamin D (CALCIUM + D PO) Take 1 tablet by mouth daily.    Yes Historical Provider, MD  Flaxseed, Linseed, (FLAXSEED OIL PO) Take 400 mg by mouth daily.   Yes Historical Provider, MD  lisinopril-hydrochlorothiazide (ZESTORETIC) 10-12.5 MG per tablet Take 1 tablet by mouth daily. 11/09/12  Yes Lelon Perla, DO  Multiple Vitamin (MULTIVITAMIN) tablet Take 1 tablet by mouth daily.     Yes Historical Provider, MD  ondansetron (ZOFRAN ODT) 4 MG disintegrating tablet Take 1 tablet (4 mg total) by mouth every 8 (eight) hours as needed for nausea. 01/05/13  Yes Lelon Perla, DO   Physical Exam: Filed Vitals:   01/10/13 1838  BP: 97/48  Pulse: 89  Temp: 98.6 F (37 C)  Resp: 20    General:  NAD, resting comfortably in bed Eyes: PEERLA EOMI ENT: mucous membranes moist Neck: supple w/o JVD Cardiovascular: RRR w/o MRG Respiratory: CTA B Abdomen: soft, nt, nd, bs+ Skin: no rash nor lesion Musculoskeletal: MAE, full ROM all 4 extremities Psychiatric: normal tone and affect Neurologic: AAOx3, grossly non-focal  Labs on Admission:  Basic Metabolic Panel:  Recent Labs Lab 01/05/13 1616 01/10/13 1708  NA 132* 128*  K 4.1 3.1*  CL 98 89*  CO2 25 26  GLUCOSE 123* 115*  BUN 17 14  CREATININE 0.77 0.74  CALCIUM 9.6 9.4   Liver Function Tests:  Recent Labs Lab 01/05/13 1616 01/10/13 1135  AST 20 18  ALT 10 13  ALKPHOS 60 66  BILITOT 2.4* 0.9  PROT 6.8 7.8  ALBUMIN 4.2 3.2*   No results found for this basename: LIPASE, AMYLASE,  in the last 168 hours No results found for this basename: AMMONIA,  in the last 168 hours CBC:  Recent Labs Lab 01/05/13 1616 01/10/13 1135  WBC 27.1* 15.2*  NEUTROABS 24.5* 12.5*  HGB 12.6 11.1*  HCT 36.1 32.5*  MCV 86.8 87.7  PLT 294 453.0*   Cardiac Enzymes: No results found for this basename: CKTOTAL, CKMB, CKMBINDEX, TROPONINI,  in the last 168 hours  BNP (last 3 results) No results found for  this basename: PROBNP,  in the last 8760 hours CBG: No results found for this basename: GLUCAP,  in the last 168 hours  Radiological Exams on Admission: Ct Abdomen Pelvis Wo Contrast  01/10/2013   CLINICAL DATA:  Right flank pain. Hydronephrosis. Hematuria. Leukocytosis.  EXAM: CT ABDOMEN AND PELVIS WITHOUT CONTRAST  TECHNIQUE: Multidetector CT imaging of the abdomen and pelvis was performed following the standard protocol without intravenous contrast.  COMPARISON:  None  FINDINGS: Subsegmental atelectasis, right lower lobe and lingula. The noncontrast CT appearance of the liver, spleen, pancreas, and adrenal glands is within normal limits. Gallbladder surgically absent. Left kidney unremarkable.  Moderate right hydronephrosis noted with hydroureter to the level of the iliac vessel cross over. Right-sided ostomy with peristomal hernia containing adipose tissue and loops of bowel, without specific findings of strangulation or obstruction.  However, there is also a large mass inseparable from the uterus with cystic and solid elements, and with speckled gas density along its upper margin which may be from adherent bowel or extraluminal gas. Combined length of the uterus and mass is approximately 16.0 cm from the cervix to the top of the mass. Adjacent mesenteric stranding noted.  There is also a retroperitoneal and pelvic adenopathy including a 1.2 cm left periaortic node on image 34 of series 2, a 1.3 cm AP window lymph node on image 45 of series 2, a 1.7 cm right external iliac lymph node on image 67 of series 2, a 1.6 cm right inguinal lymph node on image 73 of series 2, and a 1.8 cm left inguinal lymph node on image 72 of series 2, among others. There are clips in the expected location of the rectum, along with presacral stranding which is probably postoperative.  IMPRESSION: 1. Retroperitoneal and pelvic pathologic adenopathy, irregular gas density along the anterior upper margin of this heterogeneous mass.  Top differential diagnostic considerations include GI stromal tumor; uterine sarcoma or endometrial carcinoma; lymphoma; or colon cancer. Correlate with patient history. The irregular gas along the anterior margin could represent an adherent bowel, abscess, or gas in a GI stromal tumor. This mass appears to be causing extrinsic mass effect on the ureter leading to the moderate degree of right hydronephrosis. 2. 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   Electronically Signed   By: Herbie Baltimore   On: 01/10/2013 18:18   US Abdomen Complete  01/10/2013   *RADIOLOGY REPORT*  Clinical Data:  Pain  COMPLETE ABDOMINAL ULTRASOUND  Comparison:  None.  Findings:  Gallbladder:  Surgically removed.  Common bile duct:  3.5 mm.  Liver:  Increased echogenicity likely related to fatty infiltration.  No focal mass lesion is seen.  IVC:  Appears normal.  Pancreas:  No focal abnormality seen.  Spleen:  9.8 cm in length.  No mass lesion is noted.  Right Kidney:  10.1 cm in length.  Mild hydronephrosis is noted.  Left Kidney:  11 cm in length.  No hydronephrosis or mass lesion is seen.  Abdominal aorta:  No aneurysm identified.  IMPRESSION: Right-sided hydronephrosis.   Original Report Authenticated By: Alcide Clever, M.D.    EKG: Independently reviewed.  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 Unknown primary metastatic cancer to lymph nodes - clinically this patient has an abdominal mass as well as adenopathy, this could be one of a number of different cell types including endometrial carcinoma, uterine sarcoma, lymphoma or GIST, I doubt colon cancer given that it has been 24 years since she had a total colectomy.  Will need tissue sample for pathology. 2. Hydronephrosis, right - secondary to extrinsic obstruction  of ureter by the mass, kidney function appears normal at this time, given the UTI however and hydro, urology has been consulted and currently plan to stent this tomorrow morning.  Patient NPO. 3. UTI - very mild UTI findings on UA which would be consistent with an enterococcal UTI as she grew out on culture on 8/18.  Given the fever and WBC do wish to treat this, have ordered empiric vancomycin pharm to dose. 4. Sepsis due to enterococcus - IVF ordered, holding her home BP meds as her BP is slightly soft at this time, trend WBC, cultures ordered for fever, her low grade symptoms would be c/w a low virulence organism such as enterococcus as well as she is not overly toxic on exam.    Code Status: Full Code (must indicate code status--if unknown or must be presumed, indicate so) Family Communication: Spoke with daughter at bedside (indicate person spoken with, if applicable, with phone number if by telephone) Disposition Plan: Admit to inpatient (indicate anticipated LOS)  Time spent: 70 min  GARDNER, JARED M. [facility] Pager [phone]  If 7PM-7AM, please contact night-coverage [email or web address] Password [email or web address] 01/10/2013, 8:26 PM

## 2013-01-10 NOTE — ED Notes (Signed)
Bedside report received from previous RN, Selena Batten

## 2013-01-10 NOTE — Telephone Encounter (Signed)
Spoke with patient and she stated she is still having some issues with eating, some nausea and vomiting, she has lost 5 pounds and only able to keep in pedialyte pops. She says the discomfort is on the right side and feels like a pulled muscle. She said she has had an ileostomy and thinks the discomfort is related. I advised of her results and made her aware I will order a stat US die to her elevated WBC and she will need to repeat her labs. [place] put in for [facility] hospital and labs stat at [facility]. I called to make Marj aware of the stat referral.

## 2013-01-10 NOTE — ED Provider Notes (Signed)
CSN: [id number]     Arrival date & time 01/10/13  1642 History     First MD Initiated Contact with Patient 01/10/13 1651     Chief Complaint  Patient presents with  . Flank Pain    HPI   Patient follows with Dr. Reynolds Bowl.  His history of ulcerative colitis. She status post total colectomy for ulcerative colitis. Is in remission postoperatively. She has an ileostomy. Her illness started 5 days ago on Friday. She developed some nausea and some right-sided abdominal pain. She states when she has nausea and pain she tends to get some discomfort around her ileostomy. She thought that that thought this was. She is in mild to obtain her physician's office on Friday the 15th. She had fever that day as well to 103 at home. She felt like she had low-grade fevers and checked her temperature nightly and found to be up to 100.5. No temperature last night. She has not heard any followup on her results and she called her physician's office today. She was told that her total white blood cell count was elevated. She was referred for additional blood testing and ultrasound today. Her ultrasound showed some minimal right hydronephrosis. She was referred here. She states that on Friday her urine was very dark and tea colored her ostomy output was lighter in color. These have both resolved. Past Medical History  Diagnosis Date  . Hypertension   . Colitis       . GI bleed    Past Surgical History  Procedure Laterality Date  . Ileostomy  12/27/1988    Removal of Large Intestine---Tim Earlene Plater, Careers adviser  . Cholecystectomy  1991-1992   Family History  Problem Relation Age of Onset  . Hypertension Mother   . Arthritis Mother   . Hypertension Father   . Diabetes Father   . Hypertension Brother   . Diabetes Brother    History  Substance Use Topics  . Smoking status: Never Smoker   . Smokeless tobacco: Never Used  . Alcohol Use: No   OB History   Grav Para Term Preterm Abortions TAB SAB Ect Mult Living                 Review of Systems  Constitutional: Positive for fever and fatigue. Negative for chills, diaphoresis and appetite change.  HENT: Negative for sore throat, mouth sores and trouble swallowing.   Eyes: Negative for visual disturbance.  Respiratory: Negative for cough, chest tightness, shortness of breath and wheezing.   Cardiovascular: Negative for chest pain.  Gastrointestinal: Positive for nausea and abdominal pain. Negative for vomiting, diarrhea and abdominal distention.  Endocrine: Negative for polydipsia, polyphagia and polyuria.  Genitourinary: Negative for dysuria, frequency, hematuria and decreased urine volume.       Dark urine  Musculoskeletal: Negative for gait problem.  Skin: Negative for color change, pallor and rash.  Neurological: Negative for dizziness, syncope, light-headedness and headaches.  Hematological: Does not bruise/bleed easily.  Psychiatric/Behavioral: Negative for behavioral problems and confusion.    Allergies  Penicillins  Home Medications   Current Outpatient Rx  Name  Route  Sig  Dispense  Refill  . Calcium Carbonate-Vitamin D (CALCIUM + D PO)   Oral   Take 1 tablet by mouth daily.          . Flaxseed, Linseed, (FLAXSEED OIL PO)   Oral   Take 400 mg by mouth daily.         Marland Kitchen lisinopril-hydrochlorothiazide (ZESTORETIC) 10-12.5 MG per  tablet   Oral   Take 1 tablet by mouth daily.   90 tablet   3   . Multiple Vitamin (MULTIVITAMIN) tablet   Oral   Take 1 tablet by mouth daily.           . ondansetron (ZOFRAN ODT) 4 MG disintegrating tablet   Oral   Take 1 tablet (4 mg total) by mouth every 8 (eight) hours as needed for nausea.   30 tablet   0    BP 97/48  Pulse 89  Temp(Src) 98.6 F (37 C) (Oral)  Resp 20  SpO2 100% Physical Exam  Constitutional: She is oriented to person, place, and time. She appears well-developed and well-nourished. No distress.  HENT:  Head: Normocephalic.  Eyes: Conjunctivae are normal.  Pupils are equal, round, and reactive to light. No scleral icterus.  Neck: Normal range of motion. Neck supple. No thyromegaly present.  Cardiovascular: Normal rate and regular rhythm.  Exam reveals no gallop and no friction rub.   No murmur heard. Pulmonary/Chest: Effort normal and breath sounds normal. No respiratory distress. She has no wheezes. She has no rales.  Abdominal: Soft. Bowel sounds are normal. She exhibits no distension. There is no tenderness. There is no rebound.  No guarding no rebound no peritoneal irritation. Her ileostomy site appears normal.  Musculoskeletal: Normal range of motion.  Neurological: She is alert and oriented to person, place, and time.  Skin: Skin is warm and dry. No rash noted.  Psychiatric: She has a normal mood and affect. Her behavior is normal.    ED Course   Procedures (including critical care time)  Labs Reviewed  URINALYSIS, ROUTINE W REFLEX MICROSCOPIC - Abnormal; Notable for the following:    APPearance CLOUDY (*)    Hgb urine dipstick TRACE (*)    Leukocytes, UA MODERATE (*)    All other components within normal limits  BASIC METABOLIC PANEL - Abnormal; Notable for the following:    Sodium 128 (*)    Potassium 3.1 (*)    Chloride 89 (*)    Glucose, Bld 115 (*)    All other components within normal limits  URINE MICROSCOPIC-ADD ON - Abnormal; Notable for the following:    Squamous Epithelial / LPF FEW (*)    Bacteria, UA FEW (*)    All other components within normal limits  URINE CULTURE  PROTIME-INR  HEPATITIS PANEL, ACUTE  URINALYSIS, ROUTINE W REFLEX MICROSCOPIC   Ct Abdomen Pelvis Wo Contrast  01/10/2013   CLINICAL DATA:  Right flank pain. Hydronephrosis. Hematuria. Leukocytosis.  EXAM: CT ABDOMEN AND PELVIS WITHOUT CONTRAST  TECHNIQUE: Multidetector CT imaging of the abdomen and pelvis was performed following the standard protocol without intravenous contrast.  COMPARISON:  None  FINDINGS: Subsegmental atelectasis, right lower  lobe and lingula. The noncontrast CT appearance of the liver, spleen, pancreas, and adrenal glands is within normal limits. Gallbladder surgically absent. Left kidney unremarkable.  Moderate right hydronephrosis noted with hydroureter to the level of the iliac vessel cross over. Right-sided ostomy with peristomal hernia containing adipose tissue and loops of bowel, without specific findings of strangulation or obstruction.  However, there is also a large mass inseparable from the uterus with cystic and solid elements, and with speckled gas density along its upper margin which may be from adherent bowel or extraluminal gas. Combined length of the uterus and mass is approximately 16.0 cm from the cervix to the top of the mass. Adjacent mesenteric stranding noted.  There is also  a retroperitoneal and pelvic adenopathy including a 1.2 cm left periaortic node on image 34 of series 2, a 1.3 cm AP window lymph node on image 45 of series 2, a 1.7 cm right external iliac lymph node on image 67 of series 2, a 1.6 cm right inguinal lymph node on image 73 of series 2, and a 1.8 cm left inguinal lymph node on image 72 of series 2, among others. There are clips in the expected location of the rectum, along with presacral stranding which is probably postoperative.  IMPRESSION: 1. Retroperitoneal and pelvic pathologic adenopathy, irregular gas density along the anterior upper margin of this heterogeneous mass. Top differential diagnostic considerations include GI stromal tumor; uterine sarcoma or endometrial carcinoma; lymphoma; or colon cancer. Correlate with patient history. The irregular gas along the anterior margin could represent an adherent bowel, abscess, or gas in a GI stromal tumor. This mass appears to be causing extrinsic mass effect on the ureter leading to the moderate degree of right hydronephrosis. 2. 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   Electronically Signed   By: Herbie Baltimore   On: 01/10/2013 18:18   US Abdomen Complete  01/10/2013   *RADIOLOGY REPORT*  Clinical Data:  Pain  COMPLETE ABDOMINAL ULTRASOUND  Comparison:  None.  Findings:  Gallbladder:  Surgically removed.  Common bile duct:  3.5 mm.  Liver:  Increased echogenicity likely related to fatty infiltration.  No focal mass lesion is seen.  IVC:  Appears normal.  Pancreas:  No focal abnormality seen.  Spleen:  9.8 cm in length.  No mass lesion is noted.  Right Kidney:  10.1 cm in length.  Mild hydronephrosis is noted.  Left Kidney:  11 cm in length.  No hydronephrosis or mass lesion is seen.  Abdominal aorta:  No aneurysm identified.  IMPRESSION: Right-sided hydronephrosis.   Original Report Authenticated By: Alcide Clever, M.D.   1. Pyelonephritis     MDM  5 days ago her bilirubin is 2.7. Today's normal findings or whether to count was 27,000, today is down to 15,000. She had dark urine it is resolved. She is surgically absent gallbladder. She does not have duct dilatation. Hospital include acute hepatitis although her course of her resolution of symptoms is bit too soon. She may have had a common bile duct stone that has since passed. She is not having severe colicky pain that was expected this is ureteral colic. Is a noncontrast CT. We'll repeat her urinalysis today.  Studies are completed. Complicated story. She has a pyelonephritis. She has a right mid ureteral obstruction. Ureteral traction secondary to an intra-abdominal mass. This is not well defined on a noncontrast CT. Her urine cultures growing enterococcus. Urine was cultured again today. Discussed case with urology. Also discussed with Dr. Julian Reil the hospitalist. She'll need ureteral stenting. She'll need biopsy of her mass. Discuseds the case with the radiologist This is amenable to invasive radiology CT guided biopsy of the inguinal node, and even the intra-abdominal mass. Final diagnosis is pyelonephritis, ureteral obstruction, intra-abdominal  mass  Claudean Kinds, MD 01/10/13 2009

## 2013-01-10 NOTE — Telephone Encounter (Signed)
With hydronephrosis (swelling of kidney) and some blood in urine with elevated white count---- pt should go to ER-----inform ER as well   Discussed with patient ans she is going to the ED and the charge nurse has been made aware.        KP

## 2013-01-11 ENCOUNTER — Inpatient Hospital Stay (HOSPITAL_COMMUNITY): Payer: BC Managed Care – PPO

## 2013-01-11 ENCOUNTER — Other Ambulatory Visit: Payer: Self-pay | Admitting: Urology

## 2013-01-11 DIAGNOSIS — R599 Enlarged lymph nodes, unspecified: Secondary | ICD-10-CM

## 2013-01-11 DIAGNOSIS — N12 Tubulo-interstitial nephritis, not specified as acute or chronic: Secondary | ICD-10-CM

## 2013-01-11 DIAGNOSIS — E43 Unspecified severe protein-calorie malnutrition: Secondary | ICD-10-CM | POA: Insufficient documentation

## 2013-01-11 LAB — CBC
Hemoglobin: 9.4 g/dL — ABNORMAL LOW (ref 12.0–15.0)
MCH: 28.9 pg (ref 26.0–34.0)
MCHC: 32.9 g/dL (ref 30.0–36.0)
MCV: 88 fL (ref 78.0–100.0)
RBC: 3.25 MIL/uL — ABNORMAL LOW (ref 3.87–5.11)

## 2013-01-11 LAB — BASIC METABOLIC PANEL
CO2: 26 mEq/L (ref 19–32)
Calcium: 9 mg/dL (ref 8.4–10.5)
Creatinine, Ser: 0.54 mg/dL (ref 0.50–1.10)
GFR calc non Af Amer: >90 mL/min (ref >90)
Glucose, Bld: 101 mg/dL — ABNORMAL HIGH (ref 70–99)

## 2013-01-11 LAB — URINE CULTURE
Colony Count: 80000
Colony Count: 30000

## 2013-01-11 LAB — HEPATITIS PANEL, ACUTE: Hep A IgM: NEGATIVE

## 2013-01-11 NOTE — Progress Notes (Signed)
INITIAL NUTRITION ASSESSMENT  Pt meets criteria for severe MALNUTRITION in the context of chronic illness as evidenced by <75% estimated energy intake in the past month with 7.5% weight loss in the past 2 months.  DOCUMENTATION CODES Per approved criteria  -Severe malnutrition in the context of chronic illness   INTERVENTION: - Diet advancement per MD - Will continue to monitor   NUTRITION DIAGNOSIS: Inadequate oral intake related to inability to eat as evidenced by NPO.   Goal: 1. No further nausea/vomiting 2. Advance diet as tolerated to regular diet  Monitor:  Weights, labs, diet advancement, nausea/vomiting   Reason for Assessment: Nutrition risk   57 y.o. 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 Pt admitted with nausea and R sided abdominal pain. Pt reports following a clear liquid diet from May-June r/t nausea/vomiting and then went to a solid diet, but then was put on a clear liquid diet again earlier this month and was using Pedialyte. Reports 8 pound unintended weight loss in the past 2 months. Weight trend shows pt's weight down 11 pounds in the past 2 months. States she empties her ileostomy bag 3-4 times/day at home. Reports nausea started Friday and stopped Wednesday and last emesis was Tuesday. Pt found to have unknown primary metastatic cancer to lymph nodes with abdominal mass with adenopathy.   Height: Ht Readings from Last 1 Encounters:  01/10/13 5\' 1"  (1.549 m)    Weight: Wt Readings from Last 1 Encounters:  01/10/13 136 lb 8 oz (61.916 kg)    Ideal Body Weight: 105 lb   % Ideal Body Weight: 129%  Wt Readings from Last 10 Encounters:  01/10/13 136 lb 8 oz (61.916 kg)  11/09/12 147 lb (66.679 kg)  10/04/12 145 lb 9.6 oz (66.044 kg)  05/26/12 152 lb 9.6 oz (69.219 kg)  05/09/12 149 lb (67.586 kg)  11/16/11 156 lb 6.4 oz  (70.943 kg)  03/01/11 160 lb (72.576 kg)  11/09/10 162 lb (73.483 kg)  04/27/10 158 lb (71.668 kg)  10/28/09 158 lb (71.668 kg)    Usual Body Weight: 144 lb end of June 2014 per pt  % Usual Body Weight: 94%  BMI:  Body mass index is 25.8 kg/(m^2).  Estimated Nutritional Needs: Kcal: 1500-1650 Protein: 65-75g Fluid: 1.5-1.6L/day  Skin: RUQ ileostomy   Diet Order: NPO  EDUCATION NEEDS: -No education needs identified at this time   Intake/Output Summary (Last 24 hours) at 01/11/13 0945 Last data filed at 01/11/13 0600  Gross per 24 hour  Intake 1061.67 ml  Output    200 ml  Net 861.67 ml    Last BM: 8/20 from ileostomy   Labs:   Recent Labs Lab 01/05/13 1616 01/10/13 1708 01/11/13 0430  NA 132* 128* 132*  K 4.1 3.1* 3.9  CL 98 89* 97  CO2 25 26 26   BUN 17 14 11   CREATININE 0.77 0.74 0.54  CALCIUM 9.6 9.4 9.0  GLUCOSE 123* 115* 101*    CBG (last 3)  No results found for this basename: GLUCAP,  in the last 72 hours  Scheduled Meds: . heparin  5,000 Units Subcutaneous Q8H  . sodium chloride  3 mL Intravenous Q12H  . vancomycin  750 mg Intravenous Q12H    Continuous Infusions: . sodium chloride 100 mL/hr (01/11/13 0818)    Past Medical History  Diagnosis Date  . Hypertension   . Colitis  [place]  . GI bleed     Past Surgical History  Procedure Laterality Date  . Ileostomy  12/27/1988    Removal of Large Intestine---Tim Earlene Plater, Careers adviser  . Cholecystectomy  [address]    Levon Hedger MS, RD, [place] [phone] Pager [phone] After Hours Pager

## 2013-01-11 NOTE — Care Management Note (Signed)
    Page 1 of 1   01/11/2013     4:15:39 PM   CARE MANAGEMENT NOTE 01/11/2013  Patient:  Kirsten Schroeder, Kirsten Schroeder   Account Number:  [id number]  Date Initiated:  01/11/2013  Documentation initiated by:  Inda Castle  Subjective/Objective Assessment:     Action/Plan:   Anticipated DC Date:  01/14/2013   Anticipated DC Plan:  HOME/SELF CARE         Choice offered to / List presented to:             Status of service:  Completed, signed off Medicare Important Message given?   (If response is "NO", the following Medicare IM given date fields will be blank) Date Medicare IM given:   Date Additional Medicare IM given:    Discharge Disposition:    Per UR Regulation:  Reviewed for med. necessity/level of care/duration of stay  If discussed at Long Length of Stay Meetings, dates discussed:    Comments:  01/11/2013 4:10pm Cristine Polio, RN,BSN,CCDS: Patient admitted yesterday with UTI, right sided hydronephrosis and new pelvic mass. Urology and Oncology consults have been made  Currently on Vancomycin IV and for planned right sided stent tomorrow. Indepent prior to admission.  Do not anticipate any skilled needs at discharge.

## 2013-01-11 NOTE — Progress Notes (Signed)
[facility] PROGRESS NOTE  IVEY NEMBHARD ZOX:[id number] DOB: 12/18/1955 DOA: 01/10/2013 PCP: Loreen Freud, DO  Assessment/Plan: 1-Hydronephrosis, right: obstructing mass to right ureter. Plan for stent placement by Dr Retta Diones on 8-22.  2-Sepsis due to enterococcus UTI: UA with 3 to 6 WBC. Follow repeat urine culture. Urine culture from 8-18 grew enterococcus. Continue with Vancomycin. Patient presents with leukocytosis, fever, mild hypotension. Improving.  3-Hyponatremia: Improved with IV fluids.  4-Retroperitoneal and pelvic pathologic adenopathy, mass inseparable from the uterus with cystic and solid elements, and with speckled gas density along its upper margin which may be from adherent bowel. Oncologist consulted for further recommendations.      Code Status: Full Code.  Family Communication: Care discussed with patient.  Disposition Plan: to be determine   Consultants:  Dr Truett Perna.   Dr Retta Diones  Procedures:  none  Antibiotics:  Vancomycin.   HPI/Subjective: Feeling better today, no nausea or vomiting./   Objective: Filed Vitals:   01/11/13 1335  BP: 160/68  Pulse: 99  Temp: 99.2 F (37.3 C)  Resp: 18    Intake/Output Summary (Last 24 hours) at 01/11/13 1352 Last data filed at 01/11/13 1230  Gross per 24 hour  Intake 1061.67 ml  Output    500 ml  Net 561.67 ml   Filed Weights   01/10/13 2050  Weight: 61.916 kg (136 lb 8 oz)    Exam:   General:  No distress.   Cardiovascular: S 1, S 2 RRR  Respiratory: CTA  Abdomen: BS present, ileostomy in place,   Musculoskeletal: no edema.   Data Reviewed: Basic Metabolic Panel:  Recent Labs Lab 01/05/13 1616 01/10/13 1708 01/11/13 0430  NA 132* 128* 132*  K 4.1 3.1* 3.9  CL 98 89* 97  CO2 25 26 26   GLUCOSE 123* 115* 101*  BUN 17 14 11   CREATININE 0.77 0.74 0.54  CALCIUM 9.6 9.4 9.0   Liver Function Tests:  Recent Labs Lab 01/05/13 1616 01/10/13 1135  AST 20 18  ALT 10 13   ALKPHOS 60 66  BILITOT 2.4* 0.9  PROT 6.8 7.8  ALBUMIN 4.2 3.2*   No results found for this basename: LIPASE, AMYLASE,  in the last 168 hours No results found for this basename: AMMONIA,  in the last 168 hours CBC:  Recent Labs Lab 01/05/13 1616 01/10/13 1135 01/11/13 0430  WBC 27.1* 15.2* 10.7*  NEUTROABS 24.5* 12.5*  --   HGB 12.6 11.1* 9.4*  HCT 36.1 32.5* 28.6*  MCV 86.8 87.7 88.0  PLT 294 453.0* 350   Cardiac Enzymes: No results found for this basename: CKTOTAL, CKMB, CKMBINDEX, TROPONINI,  in the last 168 hours BNP (last 3 results) No results found for this basename: PROBNP,  in the last 8760 hours CBG: No results found for this basename: GLUCAP,  in the last 168 hours  Recent Results (from the past 240 hour(s))  URINE CULTURE     Status: None   Collection Time    01/08/13  4:37 PM      Result Value Range Status   Culture ENTEROCOCCUS SPECIES   Final   Colony Count 80,000 COLONIES/ML   Final   Organism ID, Bacteria ENTEROCOCCUS SPECIES   Final     Studies: Ct Abdomen Pelvis Wo Contrast  01/10/2013   CLINICAL DATA:  Right flank pain. Hydronephrosis. Hematuria. Leukocytosis.  EXAM: CT ABDOMEN AND PELVIS WITHOUT CONTRAST  TECHNIQUE: Multidetector CT imaging of the abdomen and pelvis was performed following the standard protocol without  intravenous contrast.  COMPARISON:  None  FINDINGS: Subsegmental atelectasis, right lower lobe and lingula. The noncontrast CT appearance of the liver, spleen, pancreas, and adrenal glands is within normal limits. Gallbladder surgically absent. Left kidney unremarkable.  Moderate right hydronephrosis noted with hydroureter to the level of the iliac vessel cross over. Right-sided ostomy with peristomal hernia containing adipose tissue and loops of bowel, without specific findings of strangulation or obstruction.  However, there is also a large mass inseparable from the uterus with cystic and solid elements, and with speckled gas density  along its upper margin which may be from adherent bowel or extraluminal gas. Combined length of the uterus and mass is approximately 16.0 cm from the cervix to the top of the mass. Adjacent mesenteric stranding noted.  There is also a retroperitoneal and pelvic adenopathy including a 1.2 cm left periaortic node on image 34 of series 2, a 1.3 cm AP window lymph node on image 45 of series 2, a 1.7 cm right external iliac lymph node on image 67 of series 2, a 1.6 cm right inguinal lymph node on image 73 of series 2, and a 1.8 cm left inguinal lymph node on image 72 of series 2, among others. There are clips in the expected location of the rectum, along with presacral stranding which is probably postoperative.  IMPRESSION: 1. Retroperitoneal and pelvic pathologic adenopathy, irregular gas density along the anterior upper margin of this heterogeneous mass. Top differential diagnostic considerations include GI stromal tumor; uterine sarcoma or endometrial carcinoma; lymphoma; or colon cancer. Correlate with patient history. The irregular gas along the anterior margin could represent an adherent bowel, abscess, or gas in a GI stromal tumor. This mass appears to be causing extrinsic mass effect on the ureter leading to the moderate degree of right hydronephrosis. 2. 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   Electronically Signed   By: Herbie Baltimore   On: 01/10/2013 18:18   US Abdomen Complete  01/10/2013   *RADIOLOGY REPORT*  Clinical Data:  Pain  COMPLETE ABDOMINAL ULTRASOUND  Comparison:  None.  Findings:  Gallbladder:  Surgically removed.  Common bile duct:  3.5 mm.  Liver:  Increased echogenicity likely related to fatty infiltration.  No focal mass lesion is seen.  IVC:  Appears normal.  Pancreas:  No focal abnormality seen.  Spleen:  9.8 cm in length.  No mass lesion is noted.  Right Kidney:  10.1 cm in length.  Mild hydronephrosis is noted.  Left Kidney:  11 cm in length.  No  hydronephrosis or mass lesion is seen.  Abdominal aorta:  No aneurysm identified.  IMPRESSION: Right-sided hydronephrosis.   Original Report Authenticated By: Alcide Clever, M.D.    Scheduled Meds: . heparin  5,000 Units Subcutaneous Q8H  . sodium chloride  3 mL Intravenous Q12H  . vancomycin  750 mg Intravenous Q12H   Continuous Infusions: . 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 If 7PM-7AM, please contact night-coverage at [email or web address], password [facility] 01/11/2013, 1:52 PM  LOS: 1 day

## 2013-01-11 NOTE — Consult Note (Signed)
[facility]  [phone]   ONCOLOGY  HOSPITAL CONSULTATION NOTE  Kirsten Schroeder                                MR#: [id number]  DOB: [birth year]                       CSN#: [id number]  Referring MD: [facility] Primary MD: Rosana Berger, DO    Reason for Consult: Retroperitoneal adenopathy and  Obstructive pelvic mass   Kirsten Schroeder is a 57 y.o. female admitted on 01/10/13 for treatment and evaluation of hydronephrosis and UTI in the setting of newly diagnosed pelvic mass with retroperitoneal adenopathy, for which Oncology has been requested to consult.  In review, she presented to her MD with nausea, vomiting , rid sided abdominal pain and fever up to 103, requiring a CT of the abdomen and pelvis on 01/10/2013, which revealed mild hydronephrosis (confirmed with an abdominal ultrasound) along with a very  large pelvic mas inseparable from the uterus with cystic and solid elements, and with speckled gas density along its upper margin which may be from adherent bowel or extraluminal gas. Combined length of the uterus and mass is approximately 16.0 cm from the cervix to the top of the mass. Adjacent mesenteric stranding noted. Adenopathy,including a 1.2 cm left periaortic node, a 1.3 cm AP window lymph node, 1.7 cm right external iliac lymph node ,a 1.6 cm right inguinal lymph node and a 1.8 cm left inguinal lymph node  among others.No tissue biopsy has been obtained to date.  Urology saw the patient and plans placement of a ureter stent.     PMH:  Past Medical History  Diagnosis Date  . Hypertension   . Colitis-ulcerative colitis        .      Surgeries:  Past Surgical History  Procedure Laterality Date  . Ileostomy  12/27/1988    Removal of Large Intestine---Tim Earlene Plater, Careers adviser  . Cholecystectomy  1991-1992    Allergies:  Allergies  Allergen Reactions  . Penicillins     REACTION: RASH    Medications:   Prior to Admission:  Prescriptions  prior to admission  Medication Sig Dispense Refill  . Calcium Carbonate-Vitamin D (CALCIUM + D PO) Take 1 tablet by mouth daily.       . Flaxseed, Linseed, (FLAXSEED OIL PO) Take 400 mg by mouth daily.      Marland Kitchen lisinopril-hydrochlorothiazide (ZESTORETIC) 10-12.5 MG per tablet Take 1 tablet by mouth daily.  90 tablet  3  . Multiple Vitamin (MULTIVITAMIN) tablet Take 1 tablet by mouth daily.        . ondansetron (ZOFRAN ODT) 4 MG disintegrating tablet Take 1 tablet (4 mg total) by mouth every 8 (eight) hours as needed for nausea.  30 tablet  0    [id number]  ROS: Constitutional: Positive for  8 pound unintended weight loss in the past 2 months Negative for fever, chills or  night sweats. Negative for  fatigue.  Eyes: Negative for blurred vision and double vision.  Respiratory: Negative for cough. No hemoptysis. No shortness of breath. No pleuritic chest pain.  Cardiovascular: Negative for chest pain. No palpitations.  GI: Positive for  nausea, vomiting on admission, currently controlled,no diarrhea or constipation. No change in bowel caliber. No  Melena or hematochezia. No abdominal pain.  GU: Negative for hematuria. No loss  of urinary control.No urinary retention.tea colored urine on admission, now resolved  Skin: Negative for itching. No rash. No petechia. No easy  bruising Neurological: No headaches. No motor or sensory deficits.  Family History:  Remarkable for 2 cousins with Non-Hodgkins Lymphoma and one aunt deceased with Leukemia.  Family History  Problem Relation Age of Onset  . Hypertension Mother   . Arthritis Mother   . Hypertension Father   . Diabetes Father   . Hypertension Brother   . Diabetes Brother       Social History:  reports that she has never smoked. She has never used smokeless tobacco. She reports that she does not drink alcohol or use illicit drugs.Divorced.2 children in good health Works at the [employer].  Physical Exam     Filed Vitals:   01/11/13 1335  BP: 160/68  Pulse: 99  Temp: 99.2 F (37.3 C)  Resp: 18     Filed Weights   01/10/13 2050  Weight: 136 lb 8 oz (61.916 kg)   General: 89 -year -old white female  in no acute distress A. and O. x3  well-developed and well-nourished.  HEENT: Normocephalic, atraumatic, PERRLA. Oral cavity without thrush or lesions. Upper denture plate Neck without mass Lungs clear bilaterally . No wheezing, rhonchi or rales. No axillary masses. Breasts: not examined. Cardiac regular rate and rhythm, no murmur , rubs or gallops Abdomen soft, mild tenderness in the mid lower and left abdomen , ileostomy bag present., bowel sounds x4. No HSM.  GU/rectal: deferred. Extremities no clubbing cyanosis or edema. No bruising or petechial rash. Musculoskeletal: no spinal tenderness.  Neuro: Non Focal  lymph nodes: No cervical, supraclavicular, axillary, or inguinal nodes Labs:  CBC   Recent Labs Lab 01/05/13 1616 01/10/13 1135 01/11/13 0430  WBC 27.1* 15.2* 10.7*  HGB 12.6 11.1* 9.4*  HCT 36.1 32.5* 28.6*  PLT 294 453.0* 350  MCV 86.8 87.7 88.0  MCH 30.3  --  28.9  MCHC 34.9 34.3 32.9  RDW 13.3 12.8 12.8  LYMPHSABS 0.6* 1.1  --   MONOABS 2.0* 1.6*  --   EOSABS 0.0 0.1  --   BASOSABS 0.0 0.0  --      CMP    Recent Labs Lab 01/05/13 1616 01/10/13 1135 01/10/13 1708 01/11/13 0430  NA 132*  --  128* 132*  K 4.1  --  3.1* 3.9  CL 98  --  89* 97  CO2 25  --  26 26  GLUCOSE 123*  --  115* 101*  BUN 17  --  14 11  CREATININE 0.77  --  0.74 0.54  CALCIUM 9.6  --  9.4 9.0  AST 20 18  --   --   ALT 10 13  --   --   ALKPHOS 60 66  --   --   BILITOT 2.4* 0.9  --   --         Component Value Date/Time   BILITOT 0.9 01/10/2013 1135   BILIDIR 0.2 01/10/2013 1135   IBILI 1.7* 01/05/2013 1616      Recent Labs Lab 01/10/13 1755  INR 1.17     Imaging Studies:   Ct Abdomen Pelvis Wo Contrast  01/10/2013  COMPARISON:  None  FINDINGS: Subsegmental  atelectasis, right lower lobe and lingula. The noncontrast CT appearance of the liver, spleen, pancreas, and adrenal glands is within normal limits. Gallbladder surgically absent. Left kidney unremarkable.  Moderate right hydronephrosis noted with  hydroureter to the level of the iliac vessel cross over. Right-sided ostomy with peristomal hernia containing adipose tissue and loops of bowel, without specific findings of strangulation or obstruction.  However, there is also a large mass inseparable from the uterus with cystic and solid elements, and with speckled gas density along its upper margin which may be from adherent bowel or extraluminal gas. Combined length of the uterus and mass is approximately 16.0 cm from the cervix to the top of the mass. Adjacent mesenteric stranding noted.  There is also a retroperitoneal and pelvic adenopathy including a 1.2 cm left periaortic node on image 34 of series 2, a 1.3 cm AP window lymph node on image 45 of series 2, a 1.7 cm right external iliac lymph node on image 67 of series 2, a 1.6 cm right inguinal lymph node on image 73 of series 2, and a 1.8 cm left inguinal lymph node on image 72 of series 2, among others. There are clips in the expected location of the rectum, along with presacral stranding which is probably postoperative.  IMPRESSION: 1. Retroperitoneal and pelvic pathologic adenopathy, irregular gas density along the anterior upper margin of this heterogeneous mass. Top differential diagnostic considerations include GI stromal tumor; uterine sarcoma or endometrial carcinoma; lymphoma; or colon cancer. Correlate with patient history. The irregular gas along the anterior margin could represent an adherent bowel, abscess, or gas in a GI stromal tumor. This mass appears to be causing extrinsic mass effect on the ureter leading to the moderate degree of right hydronephrosis. 2. 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   Electronically  Signed   By: Herbie Baltimore   On: 01/10/2013 18:18   Dg Chest 2 View  01/11/2013   CLINICAL DATA:  Preop abdominal mass.  EXAM: CHEST  2 VIEW  COMPARISON:  None.  FINDINGS: Preop abdominal mass.  IMPRESSION: No active cardiopulmonary disease.   Electronically Signed   By: Charlett Nose   On: 01/11/2013 14:17   US Abdomen Complete  01/10/2013    COMPLETE ABDOMINAL ULTRASOUND  Comparison:  None.  Findings:  Gallbladder:  Surgically removed.  Common bile duct:  3.5 mm.  Liver:  Increased echogenicity likely related to fatty infiltration.  No focal mass lesion is seen.  IVC:  Appears normal.  Pancreas:  No focal abnormality seen.  Spleen:  9.8 cm in length.  No mass lesion is noted.  Right Kidney:  10.1 cm in length.  Mild hydronephrosis is noted.  Left Kidney:  11 cm in length.  No hydronephrosis or mass lesion is seen.  Abdominal aorta:  No aneurysm identified.  IMPRESSION: Right-sided hydronephrosis.   Original Report Authenticated By: Alcide Clever, M.D.      A/P: 57 y.o. female with  1. Pelvic mass and Retroperitoneal  Adenopathy 2. Hydronephrosis, right - secondary to #1 3. UTI enterococcal  Per Cx culture on 8/18 on Vanco       4.    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    admission with right abdominal pain, fever, and nausea,? Secondary to the pelvic mass or right hydronephrosis       6.    anemia  Dr. Truett Perna  is to see the patient following this consult with recommendations regarding  further workup studies.  Thank you for the referral.  Marcos Eke, PA-C 01/11/2013 2:35 PM  Kirsten Schroeder was interviewed and examined. The abdomen/pelvic CT was reviewed.  She presents with a pelvic mass and retroperitoneal lymphadenopathy. I discussed the differential diagnosis with Ms. Demedeiros and her daughter. The mass is most likely related to a malignant process with the differential diagnosis including a GYN  malignancy, lymphoma, sarcoma, and a gastrointestinal malignancy. The mass could also be related to a benign process.  Recommendations:  1. Management of right hydronephrosis per Dr. Retta Diones 2. Consult interventional radiology for biopsy of the pelvic mass 3. We will followup on the pathology results and make additional staging/treatment recommendations. 4. Outpatient followup will be arranged at the [facility].

## 2013-01-11 NOTE — Consult Note (Signed)
Urology Consult   Physician requesting consult: Laban Emperor, DO  Reason for consult: Right hydronephrosis, UTI  History of Present Illness: Kirsten Schroeder is a 57 y.o. female who was admitted yesterday with newly diagnosed pelvic mass with retroperitoneal adenopathy, a UTI and right hydronephrosis. The patient experienced nausea and vomiting earlier in the week, as well as a fever of 103. She has had no abdominal or flank pain. She has had dark urine, but has not had dysuria, gross hematuria, and denies any prior history of urinary tract infections within the past several years. She denies significant lower urinary tract symptomatology. She had an abdominal ultrasound and CT of the abdomen and pelvis yesterday which revealed mild right hydronephrosis, and a significantly large pelvic mass and adenopathy. She is admitted for further evaluation.  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 She has an ileostomy, with subsequent parastomal hernia that is relatively asymptomatic. She is followed by Dr. Christella Hartigan.  She denies a history of voiding or storage urinary symptoms, hematuria, UTIs, STDs, urolithiasis, GU malignancy/trauma/surgery.  Past Medical History  Diagnosis Date  . Hypertension   . Colitis     [place]  . GI bleed     Past Surgical History  Procedure Laterality Date  . Ileostomy  12/27/1988    Removal of Large Intestine---Tim Earlene Plater, Careers adviser  . Cholecystectomy  1991-1992     Current Hospital Medications: Scheduled Meds: . heparin  5,000 Units Subcutaneous Q8H  . sodium chloride  3 mL Intravenous Q12H  . vancomycin  750 mg Intravenous Q12H   Continuous Infusions: . sodium chloride 100 mL/hr (01/11/13 0818)   PRN Meds:.ondansetron  Allergies:  Allergies  Allergen Reactions  . Penicillins     REACTION: RASH    Family History  Problem Relation Age of Onset  . Hypertension Mother   . Arthritis Mother   .  Hypertension Father   . Diabetes Father   . Hypertension Brother   . Diabetes Brother     Social History:  reports that she has never smoked. She has never used smokeless tobacco. She reports that she does not drink alcohol or use illicit drugs.  ROS: A complete review of systems was performed.  All systems are negative except for pertinent findings as noted.  Physical Exam:  Vital signs in last 24 hours: Temp:  [98.6 F (37 C)-99.4 F (37.4 C)] 98.7 F (37.1 C) (08/21 0605) Pulse Rate:  [89-110] 89 (08/21 0605) Resp:  [16-20] 20 (08/21 0605) BP: (97-110)/(48-64) 110/62 mmHg (08/21 0605) SpO2:  [98 %-100 %] 98 % (08/21 0605) Weight:  [61.916 kg (136 lb 8 oz)] 61.916 kg (136 lb 8 oz) (08/20 2050) General:  Alert and oriented, No acute distress HEENT: Normocephalic, atraumatic Neck: No JVD or lymphadenopathy Cardiovascular: Regular rate and rhythm Lungs: Clear bilaterally Abdomen: Soft, nontender, nondistended, no abdominal or flank masses. There is a large irreducible peristomal hernia that is nontender. Stoma appears normal. Back: No CVA tenderness Extremities: No edema Neurologic: Grossly intact  Laboratory Data:   Recent Labs  01/10/13 1135 01/11/13 0430  WBC 15.2* 10.7*  HGB 11.1* 9.4*  HCT 32.5* 28.6*  PLT 453.0* 350     Recent Labs  01/10/13 1708 01/11/13 0430  NA 128* 132*  K 3.1* 3.9  CL 89* 97  GLUCOSE 115* 101*  BUN 14 11  CALCIUM 9.4 9.0  CREATININE 0.74 0.54     Results for orders placed during the  hospital encounter of 01/10/13 (from the past 24 hour(s))  BASIC METABOLIC PANEL     Status: Abnormal   Collection Time    01/10/13  5:08 PM      Result Value Range   Sodium 128 (*) 135 - 145 mEq/L   Potassium 3.1 (*) 3.5 - 5.1 mEq/L   Chloride 89 (*) 96 - 112 mEq/L   CO2 26  19 - 32 mEq/L   Glucose, Bld 115 (*) 70 - 99 mg/dL   BUN 14  6 - 23 mg/dL   Creatinine, Ser 1.61  0.50 - 1.10 mg/dL   Calcium 9.4  8.4 - 09.6 mg/dL   GFR calc non Af  Amer >90  >90 mL/min   GFR calc Af Amer >90  >90 mL/min  PROTIME-INR     Status: None   Collection Time    01/10/13  5:55 PM      Result Value Range   Prothrombin Time 14.7  11.6 - 15.2 seconds   INR 1.17  0.00 - 1.49  URINALYSIS, ROUTINE W REFLEX MICROSCOPIC     Status: Abnormal   Collection Time    01/10/13  6:46 PM      Result Value Range   Color, Urine YELLOW  YELLOW   APPearance CLOUDY (*) CLEAR   Specific Gravity, Urine 1.019  1.005 - 1.030   pH 5.0  5.0 - 8.0   Glucose, UA NEGATIVE  NEGATIVE mg/dL   Hgb urine dipstick TRACE (*) NEGATIVE   Bilirubin Urine NEGATIVE  NEGATIVE   Ketones, ur NEGATIVE  NEGATIVE mg/dL   Protein, ur NEGATIVE  NEGATIVE mg/dL   Urobilinogen, UA 0.2  0.0 - 1.0 mg/dL   Nitrite NEGATIVE  NEGATIVE   Leukocytes, UA MODERATE (*) NEGATIVE  URINE MICROSCOPIC-ADD ON     Status: Abnormal   Collection Time    01/10/13  6:46 PM      Result Value Range   Squamous Epithelial / LPF FEW (*) RARE   WBC, UA 3-6  <3 WBC/hpf   RBC / HPF 0-2  <3 RBC/hpf   Bacteria, UA FEW (*) RARE   Urine-Other MUCOUS PRESENT    CBC     Status: Abnormal   Collection Time    01/11/13  4:30 AM      Result Value Range   WBC 10.7 (*) 4.0 - 10.5 K/uL   RBC 3.25 (*) 3.87 - 5.11 MIL/uL   Hemoglobin 9.4 (*) 12.0 - 15.0 g/dL   HCT 04.5 (*) 40.9 - 81.1 %   MCV 88.0  78.0 - 100.0 fL   MCH 28.9  26.0 - 34.0 pg   MCHC 32.9  30.0 - 36.0 g/dL   RDW 91.4  78.2 - 95.6 %   Platelets 350  150 - 400 K/uL  BASIC METABOLIC PANEL     Status: Abnormal   Collection Time    01/11/13  4:30 AM      Result Value Range   Sodium 132 (*) 135 - 145 mEq/L   Potassium 3.9  3.5 - 5.1 mEq/L   Chloride 97  96 - 112 mEq/L   CO2 26  19 - 32 mEq/L   Glucose, Bld 101 (*) 70 - 99 mg/dL   BUN 11  6 - 23 mg/dL   Creatinine, Ser 2.13  0.50 - 1.10 mg/dL   Calcium 9.0  8.4 - 08.6 mg/dL   GFR calc non Af Amer >90  >90 mL/min   GFR calc Af Amer >  90  >90 mL/min   Recent Results (from the past 240 hour(s))  URINE  CULTURE     Status: None   Collection Time    01/08/13  4:37 PM      Result Value Range Status   Culture ENTEROCOCCUS SPECIES   Final   Colony Count 80,000 COLONIES/ML   Final   Organism ID, Bacteria ENTEROCOCCUS SPECIES   Final    Renal Function:  Recent Labs  01/05/13 1616 01/10/13 1708 01/11/13 0430  CREATININE 0.77 0.74 0.54   Estimated Creatinine Clearance: 65.4 ml/min (by C-G formula based on Cr of 0.54).  Radiologic Imaging: Ct Abdomen Pelvis Wo Contrast  01/10/2013   CLINICAL DATA:  Right flank pain. Hydronephrosis. Hematuria. Leukocytosis.  EXAM: CT ABDOMEN AND PELVIS WITHOUT CONTRAST  TECHNIQUE: Multidetector CT imaging of the abdomen and pelvis was performed following the standard protocol without intravenous contrast.  COMPARISON:  None  FINDINGS: Subsegmental atelectasis, right lower lobe and lingula. The noncontrast CT appearance of the liver, spleen, pancreas, and adrenal glands is within normal limits. Gallbladder surgically absent. Left kidney unremarkable.  Moderate right hydronephrosis noted with hydroureter to the level of the iliac vessel cross over. Right-sided ostomy with peristomal hernia containing adipose tissue and loops of bowel, without specific findings of strangulation or obstruction.  However, there is also a large mass inseparable from the uterus with cystic and solid elements, and with speckled gas density along its upper margin which may be from adherent bowel or extraluminal gas. Combined length of the uterus and mass is approximately 16.0 cm from the cervix to the top of the mass. Adjacent mesenteric stranding noted.  There is also a retroperitoneal and pelvic adenopathy including a 1.2 cm left periaortic node on image 34 of series 2, a 1.3 cm AP window lymph node on image 45 of series 2, a 1.7 cm right external iliac lymph node on image 67 of series 2, a 1.6 cm right inguinal lymph node on image 73 of series 2, and a 1.8 cm left inguinal lymph node on image  72 of series 2, among others. There are clips in the expected location of the rectum, along with presacral stranding which is probably postoperative.  IMPRESSION: 1. Retroperitoneal and pelvic pathologic adenopathy, irregular gas density along the anterior upper margin of this heterogeneous mass. Top differential diagnostic considerations include GI stromal tumor; uterine sarcoma or endometrial carcinoma; lymphoma; or colon cancer. Correlate with patient history. The irregular gas along the anterior margin could represent an adherent bowel, abscess, or gas in a GI stromal tumor. This mass appears to be causing extrinsic mass effect on the ureter leading to the moderate degree of right hydronephrosis. 2. 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   Electronically Signed   By: Herbie Baltimore   On: 01/10/2013 18:18   US Abdomen Complete  01/10/2013   *RADIOLOGY REPORT*  Clinical Data:  Pain  COMPLETE ABDOMINAL ULTRASOUND  Comparison:  None.  Findings:  Gallbladder:  Surgically removed.  Common bile duct:  3.5 mm.  Liver:  Increased echogenicity likely related to fatty infiltration.  No focal mass lesion is seen.  IVC:  Appears normal.  Pancreas:  No focal abnormality seen.  Spleen:  9.8 cm in length.  No mass lesion is noted.  Right Kidney:  10.1 cm in length.  Mild hydronephrosis is noted.  Left Kidney:  11 cm in length.  No hydronephrosis or mass lesion is seen.  Abdominal aorta:  No  aneurysm identified.  IMPRESSION: Right-sided hydronephrosis.   Original Report Authenticated By: Alcide Clever, M.D.    I independently reviewed the above imaging studies.  Impression/Assessment:  1. Newly diagnosed pelvic mass  2. Subsequent mild right hydroureteronephrosis from the mass as well as possible adenopathy in the retroperitoneum. The patient is not currently symptomatic  3. Urinary tract infection. The patient does have a recent history of fever to 103, but she has no perinephric  stranding on the right, no CVA tenderness, and is currently afebrile and treated for her enterococcal UTI. More than likely, her enterococcal UTI is from her lower urinary tract.  Plan:  1. I have discussed the findings of the CT scan and subsequent hydronephrosis with the patient and her daughter, who was in the room today  2. As she does not have evidence of a florid pyelonephritis, and is currently on vancomycin, I think we can hold off on stent placement today, but get this scheduled for next available time. I have discontinued the patient's n.p.o. order.  3. Continue vancomycin  4. I will see about getting a stent placed on the right side tomorrow. I have discussed the procedure with the patient and her daughter.

## 2013-01-12 ENCOUNTER — Ambulatory Visit (HOSPITAL_COMMUNITY): Payer: BC Managed Care – PPO

## 2013-01-12 ENCOUNTER — Encounter (HOSPITAL_COMMUNITY)
Admission: EM | Disposition: A | Discharge status: Home / Self Care | Payer: Self-pay | Source: Home / Self Care | Attending: Internal Medicine

## 2013-01-12 ENCOUNTER — Encounter (HOSPITAL_COMMUNITY): Payer: Self-pay | Admitting: Registered Nurse

## 2013-01-12 ENCOUNTER — Inpatient Hospital Stay (HOSPITAL_COMMUNITY): Payer: BC Managed Care – PPO | Admitting: Registered Nurse

## 2013-01-12 ENCOUNTER — Inpatient Hospital Stay (HOSPITAL_COMMUNITY): Payer: BC Managed Care – PPO

## 2013-01-12 ENCOUNTER — Ambulatory Visit: Payer: BC Managed Care – PPO | Admitting: Gynecology

## 2013-01-12 DIAGNOSIS — R1900 Intra-abdominal and pelvic swelling, mass and lump, unspecified site: Secondary | ICD-10-CM

## 2013-01-12 DIAGNOSIS — C772 Secondary and unspecified malignant neoplasm of intra-abdominal lymph nodes: Secondary | ICD-10-CM

## 2013-01-12 HISTORY — PX: CYSTOSCOPY W/ URETERAL STENT PLACEMENT: SHX1429

## 2013-01-12 LAB — BASIC METABOLIC PANEL
BUN: 6 mg/dL (ref 6–23)
Calcium: 9.1 mg/dL (ref 8.4–10.5)
Creatinine, Ser: 0.55 mg/dL (ref 0.50–1.10)
GFR calc non Af Amer: >90 mL/min (ref >90)
Glucose, Bld: 97 mg/dL (ref 70–99)

## 2013-01-12 LAB — CBC
HCT: 32 % — ABNORMAL LOW (ref 36.0–46.0)
Hemoglobin: 10.5 g/dL — ABNORMAL LOW (ref 12.0–15.0)
MCH: 29.4 pg (ref 26.0–34.0)
MCHC: 32.8 g/dL (ref 30.0–36.0)
MCV: 89.6 fL (ref 78.0–100.0)
RDW: 12.6 % (ref 11.5–15.5)

## 2013-01-12 SURGERY — CYSTOSCOPY, WITH RETROGRADE PYELOGRAM AND URETERAL STENT INSERTION
Anesthesia: General | Site: Ureter | Laterality: Right | Wound class: Clean Contaminated

## 2013-01-12 MED ORDER — HYOSCYAMINE SULFATE 0.125 MG SL SUBL
0.2500 mg | SUBLINGUAL_TABLET | Freq: Once | SUBLINGUAL | Status: AC
  Administered 2013-01-12: 0.25 mg via SUBLINGUAL
  Filled 2013-01-12: qty 2

## 2013-01-12 MED ORDER — MIDAZOLAM HCL 5 MG/5ML IJ SOLN
INTRAMUSCULAR | Status: DC | PRN
  Administered 2013-01-12: 2 mg via INTRAVENOUS

## 2013-01-12 MED ORDER — IOHEXOL 300 MG/ML  SOLN: 50.0000 mL | INTRAMUSCULAR | Status: DC

## 2013-01-12 MED ORDER — FENTANYL CITRATE 0.05 MG/ML IJ SOLN
25.0000 ug | INTRAMUSCULAR | Status: DC | PRN
  Administered 2013-01-12 (×2): 25 ug via INTRAVENOUS

## 2013-01-12 MED ORDER — IOHEXOL 300 MG/ML  SOLN
INTRAMUSCULAR | Status: DC | PRN
  Administered 2013-01-12: 10 mL via INTRAVENOUS

## 2013-01-12 MED ORDER — FENTANYL CITRATE 0.05 MG/ML IJ SOLN
INTRAMUSCULAR | Status: DC | PRN
  Administered 2013-01-12 (×2): 50 ug via INTRAVENOUS

## 2013-01-12 MED ORDER — LACTATED RINGERS IV SOLN
INTRAVENOUS | Status: DC | PRN
  Administered 2013-01-12: 12:00:00 via INTRAVENOUS

## 2013-01-12 MED ORDER — LACTATED RINGERS IV SOLN: INTRAVENOUS | Status: DC

## 2013-01-12 MED ORDER — CIPROFLOXACIN IN D5W 400 MG/200ML IV SOLN
400.0000 mg | Freq: Two times a day (BID) | INTRAVENOUS | Status: AC
Start: 2013-01-12 — End: 2013-01-13
  Administered 2013-01-12 – 2013-01-13 (×2): 400 mg via INTRAVENOUS
  Filled 2013-01-12 (×2): qty 200

## 2013-01-12 MED ORDER — BELLADONNA ALKALOIDS-OPIUM 16.2-60 MG RE SUPP
RECTAL | Status: AC
  Filled 2013-01-12: qty 1

## 2013-01-12 MED ORDER — LIDOCAINE HCL (CARDIAC) 20 MG/ML IV SOLN
INTRAVENOUS | Status: DC | PRN
  Administered 2013-01-12: 100 mg via INTRAVENOUS

## 2013-01-12 MED ORDER — DEXAMETHASONE SODIUM PHOSPHATE 10 MG/ML IJ SOLN
INTRAMUSCULAR | Status: DC | PRN
  Administered 2013-01-12: 10 mg via INTRAVENOUS

## 2013-01-12 MED ORDER — ONDANSETRON HCL 4 MG/2ML IJ SOLN
INTRAMUSCULAR | Status: DC | PRN
  Administered 2013-01-12: 4 mg via INTRAVENOUS

## 2013-01-12 MED ORDER — STERILE WATER FOR IRRIGATION IR SOLN
Status: DC | PRN
  Administered 2013-01-12: 3000 mL

## 2013-01-12 MED ORDER — PROPOFOL 10 MG/ML IV BOLUS
INTRAVENOUS | Status: DC | PRN
  Administered 2013-01-12: 150 mg via INTRAVENOUS

## 2013-01-12 MED ORDER — IOHEXOL 300 MG/ML  SOLN
50.0000 mL | Freq: Once | INTRAMUSCULAR | Status: AC | PRN
  Administered 2013-01-12: 50 mL via ORAL

## 2013-01-12 MED ORDER — IOHEXOL 300 MG/ML  SOLN
100.0000 mL | Freq: Once | INTRAMUSCULAR | Status: AC | PRN
  Administered 2013-01-12: 100 mL via INTRAVENOUS

## 2013-01-12 MED ORDER — IOHEXOL 300 MG/ML  SOLN
INTRAMUSCULAR | Status: AC
  Filled 2013-01-12: qty 1

## 2013-01-12 MED ORDER — FENTANYL CITRATE 0.05 MG/ML IJ SOLN
INTRAMUSCULAR | Status: AC
  Filled 2013-01-12: qty 2

## 2013-01-12 MED ORDER — LIDOCAINE HCL 2 % EX GEL
CUTANEOUS | Status: AC
  Filled 2013-01-12: qty 10

## 2013-01-12 SURGICAL SUPPLY — 18 items
BAG URO CATCHER STRL LF (DRAPE) ×2 IMPLANT
CATH INTERMIT  6FR 70CM (CATHETERS) ×2 IMPLANT
CATH URET WHISTLE 6FR (CATHETERS) IMPLANT
CLOTH BEACON ORANGE TIMEOUT ST (SAFETY) ×2 IMPLANT
DRAPE CAMERA CLOSED 9X96 (DRAPES) ×2 IMPLANT
GLOVE BIOGEL M 8.0 STRL (GLOVE) ×2 IMPLANT
GOWN PREVENTION PLUS XLARGE (GOWN DISPOSABLE) ×2 IMPLANT
GOWN STRL REIN XL XLG (GOWN DISPOSABLE) ×2 IMPLANT
GUIDEWIRE AMPLATZ STIFF 0.35 (WIRE) ×2 IMPLANT
GUIDEWIRE ANG ZIPWIRE 035X150 (WIRE) ×2 IMPLANT
GUIDEWIRE ANG ZIPWIRE 038X150 (WIRE) ×2 IMPLANT
GUIDEWIRE STR DUAL SENSOR (WIRE) ×2 IMPLANT
MANIFOLD NEPTUNE II (INSTRUMENTS) ×2 IMPLANT
PACK CYSTO (CUSTOM PROCEDURE TRAY) ×2 IMPLANT
STENT CONTOUR 6FRX24X.038 (STENTS) ×2 IMPLANT
STENT CONTOUR 7FRX24 (STENTS) ×2 IMPLANT
STENT POLARIS 5FRX24 (STENTS) ×2 IMPLANT
TUBING CONNECTING 10 (TUBING) IMPLANT

## 2013-01-12 NOTE — Anesthesia Preprocedure Evaluation (Addendum)
Anesthesia Evaluation  Patient identified by MRN, date of birth, ID band Patient awake    Reviewed: Allergy & Precautions, H&P , NPO status , Patient's Chart, lab work & pertinent test results  Airway Mallampati: II TM Distance: >3 FB Neck ROM: full    Dental  (+) Edentulous Upper and Dental Advisory Given   Pulmonary neg pulmonary ROS,  breath sounds clear to auscultation  Pulmonary exam normal       Cardiovascular Exercise Tolerance: Good hypertension, Pt. on medications Rhythm:regular Rate:Normal     Neuro/Psych negative neurological ROS  negative psych ROS   GI/Hepatic negative GI ROS, Neg liver ROS,   Endo/Other  negative endocrine ROS  Renal/GU negative Renal ROS  negative genitourinary   Musculoskeletal   Abdominal   Peds  Hematology negative hematology ROS (+) anemia , hgb 10   Anesthesia Other Findings Metastatic cancer  Reproductive/Obstetrics negative OB ROS                          Anesthesia Physical Anesthesia Plan  ASA: III  Anesthesia Plan: General   Post-op Pain Management:    Induction: Intravenous  Airway Management Planned: LMA  Additional Equipment:   Intra-op Plan:   Post-operative Plan:   Informed Consent: I have reviewed the patients History and Physical, chart, labs and discussed the procedure including the risks, benefits and alternatives for the proposed anesthesia with the patient or authorized representative who has indicated his/her understanding and acceptance.   Dental Advisory Given  Plan Discussed with: CRNA and Surgeon  Anesthesia Plan Comments:         Anesthesia Quick Evaluation

## 2013-01-12 NOTE — Progress Notes (Signed)
Consult Note: Gyn-Onc   Kirsten Schroeder 57 y.o. female  No chief complaint on file.   Assessment : Large pelvic mass with adenopathy. This is likely the patient's uterus. 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  Plan: Given that I am unable to perform an endometrial biopsy or a thorough gynecologic examination, I would recommend a core biopsy directed by CT to attempt to obtain tissue for diagnosis. Presuming this is a gynecologic malignancy, the patient given her return appointment to see me on its August 29 to review pathology and developed a treatment plan.Marland Kitchen    HPI: 57 year old white female seen in consultation at the request of Dr Mancel Bale regarding a newly diagnosed pelvic mass in the patient was initially admitted for evaluation of hydronephrosis and a UTI. Further evaluation revealed a pelvic mass retroperitoneal adenopathy. From a gynecologic point of view the patient denies any past gynecologic history. She said she had a pelvic examination in June which is relatively normal. 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 Patient denies any pelvic pain. She went through menopause at about age 65 and has not had any bleeding.  Review of Systems:10 point review of systems is negative except as noted in interval history.   Vitals: There were no vitals taken for this visit.  Physical Exam: General : The patient appears fatigued and distraught in mild distress from abdominal discomfort. 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 This is easily reducible. In addition there is a suprapubic mass extending approximately 6 cm above the pubis which is firm and  tender. I do not detect any ascites. Pelvic:  EGBUS: Normal female  Vagina: Stenotic. The patient does not tolerate a speculum exam even using a narrow speculum.   Bi-manual examination: A single finger bimanual exam is not well tolerated either. The patient's in considerable discomfort.   Lower extremities: No edema or varicosities. Normal range of motion      Allergies  Allergen Reactions  . Penicillins     REACTION: RASH    Past Medical History  Diagnosis Date  . Hypertension   . Colitis     [place]  . GI bleed     Past Surgical History  Procedure Laterality Date  . Ileostomy  12/27/1988    Removal of Large Intestine---Tim Earlene Plater, Careers adviser  . Cholecystectomy  [phone]    No current facility-administered medications for this visit.   No current outpatient prescriptions on file.   Facility-Administered Medications Ordered in Other Visits  Medication Dose Route Frequency Provider Last Rate Last Dose  . 0.9 %  sodium chloride infusion   Intravenous Continuous Hillary Bow, DO 100 mL/hr at 01/12/13 0547    . heparin injection 5,000 Units  5,000 Units Subcutaneous Q8H Hillary Bow, DO   5,000 Units at 01/12/13 0524  . lactated ringers infusion   Intravenous Continuous Phillips Grout, MD      . ondansetron (ZOFRAN-ODT) disintegrating tablet 4 mg  4 mg Oral Q8H PRN Hillary Bow, DO      . sodium chloride 0.9 % injection 3 mL  3 mL Intravenous Q12H Hillary Bow, DO      . vancomycin (VANCOCIN) IVPB 750 mg/150 ml premix  750 mg Intravenous Q12H Otho Bellows, RPH   750 mg at 01/12/13 2956    History   Social History  . Marital Status: Divorced    Spouse Name: N/A    Number of Children: 2  . 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 Smoking status: Never Smoker   . Smokeless tobacco: Never Used  . Alcohol Use: No  . Drug Use: No  . Sexual Activity: No   Other  Topics Concern  . Not on file   Social History Narrative   Gets reg exercise    Family History  Problem Relation Age of Onset  . Hypertension Mother   . Arthritis Mother   . Hypertension Father   . Diabetes Father   . Hypertension Brother   . Diabetes Brother       Jeannette Corpus, MD 01/12/2013, 10:58 AM

## 2013-01-12 NOTE — Anesthesia Postprocedure Evaluation (Signed)
  Anesthesia Post-op Note  Patient: Kirsten Schroeder  Procedure(s) Performed: Procedure(s) (LRB): CYSTOSCOPY WITH RETROGRADE PYELOGRAM/ RIGHT DOUBLE J STENT PLACEMENT (Right)  Patient Location: PACU  Anesthesia Type: General  Level of Consciousness: awake and alert   Airway and Oxygen Therapy: Patient Spontanous Breathing  Post-op Pain: mild  Post-op Assessment: Post-op Vital signs reviewed, Patient's Cardiovascular Status Stable, Respiratory Function Stable, Patent Airway and No signs of Nausea or vomiting  Last Vitals:  Filed Vitals:   01/12/13 1430  BP: 126/65  Pulse: 89  Temp: 36.8 C  Resp: 16    Post-op Vital Signs: stable   Complications: No apparent anesthesia complications

## 2013-01-12 NOTE — Progress Notes (Signed)
Informed patient that surgery would be at 1pm, pickup time will be approximately 12noon.  Explained the presurgical process and patient verbalized understanding

## 2013-01-12 NOTE — Transfer of Care (Signed)
Immediate Anesthesia Transfer of Care Note  Patient: Kirsten Schroeder  Procedure(s) Performed: Procedure(s): CYSTOSCOPY WITH RETROGRADE PYELOGRAM/ RIGHT DOUBLE J STENT PLACEMENT (Right)  Patient Location: PACU  Anesthesia Type:General  Level of Consciousness: sedated  Airway & Oxygen Therapy: Patient Spontanous Breathing and Patient connected to face mask oxygen  Post-op Assessment: Report given to PACU RN and Post -op Vital signs reviewed and stable  Post vital signs: Reviewed and stable  Complications: No apparent anesthesia complications

## 2013-01-12 NOTE — Progress Notes (Signed)
[facility] PROGRESS NOTE  Kirsten Schroeder FAO:[id number] DOB: [birth year] DOA: 01/10/2013 PCP: Loreen Freud, DO  Assessment/Plan:  1-Hydronephrosis, right: obstructing mass to right ureter. Plan for stent placement by Dr Retta Diones on 8-22.   2-Sepsis due to enterococcus UTI: UA with 3 to 6 WBC. Follow repeat urine culture with only 30,000 colonies. Urine culture from 8-18 grew enterococcus. Continue with Vancomycin day 2. Patient presents with leukocytosis, fever, mild hypotension. Improving.   3-Hyponatremia: Improved with IV fluids. B-met pending for this morning.   4-Retroperitoneal and pelvic pathologic adenopathy, mass inseparable from the uterus with cystic and solid elements:  Will get abdomen and pelvis with oral and IV contrast. Discussed case with Dr Truett Perna, Dr Sharol Given with GYN recommend to proceed with CT guide biopsy. IR would like to get CT abdomen and pelvis with contrast prior biopsy.      Code Status: Full Code.  Family Communication: Care discussed with patient.  Disposition Plan: to be determine   Consultants:  Dr Truett Perna.   Dr Retta Diones  Procedures:  none  Antibiotics:  Vancomycin.   HPI/Subjective: Feeling well. Had mild back pain last night better after heating patch.  Waiting for procedure today.   Objective: Filed Vitals:   01/12/13 0843  BP: 107/66  Pulse: 85  Temp: 99 F (37.2 C)  Resp: 17    Intake/Output Summary (Last 24 hours) at 01/12/13 1107 Last data filed at 01/12/13 0600  Gross per 24 hour  Intake   2450 ml  Output    500 ml  Net   1950 ml   Filed Weights   01/10/13 2050  Weight: 61.916 kg (136 lb 8 oz)    Exam:   General:  No distress.   Cardiovascular: S 1, S 2 RRR  Respiratory: CTA  Abdomen: BS present, ileostomy in place,   Musculoskeletal: no edema.   Data Reviewed: Basic Metabolic Panel:  Recent Labs Lab 01/05/13 1616 01/10/13 1708 01/11/13 0430  NA 132* 128* 132*  K 4.1 3.1* 3.9  CL 98  89* 97  CO2 25 26 26   GLUCOSE 123* 115* 101*  BUN 17 14 11   CREATININE 0.77 0.74 0.54  CALCIUM 9.6 9.4 9.0   Liver Function Tests:  Recent Labs Lab 01/05/13 1616 01/10/13 1135  AST 20 18  ALT 10 13  ALKPHOS 60 66  BILITOT 2.4* 0.9  PROT 6.8 7.8  ALBUMIN 4.2 3.2*   No results found for this basename: LIPASE, AMYLASE,  in the last 168 hours No results found for this basename: AMMONIA,  in the last 168 hours CBC:  Recent Labs Lab 01/05/13 1616 01/10/13 1135 01/11/13 0430  WBC 27.1* 15.2* 10.7*  NEUTROABS 24.5* 12.5*  --   HGB 12.6 11.1* 9.4*  HCT 36.1 32.5* 28.6*  MCV 86.8 87.7 88.0  PLT 294 453.0* 350   Cardiac Enzymes: No results found for this basename: CKTOTAL, CKMB, CKMBINDEX, TROPONINI,  in the last 168 hours BNP (last 3 results) No results found for this basename: PROBNP,  in the last 8760 hours CBG: No results found for this basename: GLUCAP,  in the last 168 hours  Recent Results (from the past 240 hour(s))  URINE CULTURE     Status: None   Collection Time    01/08/13  4:37 PM      Result Value Range Status   Culture ENTEROCOCCUS SPECIES   Final   Colony Count 80,000 COLONIES/ML   Final   Organism ID, Bacteria ENTEROCOCCUS SPECIES  Final  URINE CULTURE     Status: None   Collection Time    01/10/13  6:46 PM      Result Value Range Status   Specimen Description URINE, CLEAN CATCH   Final   Special Requests NONE   Final   Culture  Setup Time     Final   Value: 01/11/2013 01:14     Performed at [facility] Count     Final   Value: 30,000 COLONIES/ML     Performed at [facility]   Culture     Final   Value: Multiple bacterial morphotypes present, none predominant. Suggest appropriate recollection if clinically indicated.     Performed at [facility]   Report Status 01/11/2013 FINAL   Final  SURGICAL PCR SCREEN     Status: None   Collection Time    01/12/13  7:37 AM      Result Value Range Status   MRSA, PCR  NEGATIVE  NEGATIVE Final   Staphylococcus aureus NEGATIVE  NEGATIVE Final   Comment:            The Xpert SA Assay (FDA     approved for NASAL specimens     in patients over 71 years of age),     is one component of     a comprehensive surveillance     program.  Test performance has     been validated by [facility] for patients greater     than or equal to 53 year old.     It is not intended     to diagnose infection nor to     guide or monitor treatment.     Studies: Ct Abdomen Pelvis Wo Contrast  01/10/2013   CLINICAL DATA:  Right flank pain. Hydronephrosis. Hematuria. Leukocytosis.  EXAM: CT ABDOMEN AND PELVIS WITHOUT CONTRAST  TECHNIQUE: Multidetector CT imaging of the abdomen and pelvis was performed following the standard protocol without intravenous contrast.  COMPARISON:  None  FINDINGS: Subsegmental atelectasis, right lower lobe and lingula. The noncontrast CT appearance of the liver, spleen, pancreas, and adrenal glands is within normal limits. Gallbladder surgically absent. Left kidney unremarkable.  Moderate right hydronephrosis noted with hydroureter to the level of the iliac vessel cross over. Right-sided ostomy with peristomal hernia containing adipose tissue and loops of bowel, without specific findings of strangulation or obstruction.  However, there is also a large mass inseparable from the uterus with cystic and solid elements, and with speckled gas density along its upper margin which may be from adherent bowel or extraluminal gas. Combined length of the uterus and mass is approximately 16.0 cm from the cervix to the top of the mass. Adjacent mesenteric stranding noted.  There is also a retroperitoneal and pelvic adenopathy including a 1.2 cm left periaortic node on image 34 of series 2, a 1.3 cm AP window lymph node on image 45 of series 2, a 1.7 cm right external iliac lymph node on image 67 of series 2, a 1.6 cm right inguinal lymph node on image 73 of series 2, and a  1.8 cm left inguinal lymph node on image 72 of series 2, among others. There are clips in the expected location of the rectum, along with presacral stranding which is probably postoperative.  IMPRESSION: 1. Retroperitoneal and pelvic pathologic adenopathy, irregular gas density along the anterior upper margin of this heterogeneous mass. Top differential diagnostic considerations include GI  stromal tumor; uterine sarcoma or endometrial carcinoma; lymphoma; or colon cancer. Correlate with patient history. The irregular gas along the anterior margin could represent an adherent bowel, abscess, or gas in a GI stromal tumor. This mass appears to be causing extrinsic mass effect on the ureter leading to the moderate degree of right hydronephrosis. 2. 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   Electronically Signed   By: Herbie Baltimore   On: 01/10/2013 18:18   Dg Chest 2 View  01/11/2013   CLINICAL DATA:  Preop abdominal mass.  EXAM: CHEST  2 VIEW  COMPARISON:  None.  FINDINGS: Preop abdominal mass.  IMPRESSION: No active cardiopulmonary disease.   Electronically Signed   By: Charlett Nose   On: 01/11/2013 14:17   US Abdomen Complete  01/10/2013   *RADIOLOGY REPORT*  Clinical Data:  Pain  COMPLETE ABDOMINAL ULTRASOUND  Comparison:  None.  Findings:  Gallbladder:  Surgically removed.  Common bile duct:  3.5 mm.  Liver:  Increased echogenicity likely related to fatty infiltration.  No focal mass lesion is seen.  IVC:  Appears normal.  Pancreas:  No focal abnormality seen.  Spleen:  9.8 cm in length.  No mass lesion is noted.  Right Kidney:  10.1 cm in length.  Mild hydronephrosis is noted.  Left Kidney:  11 cm in length.  No hydronephrosis or mass lesion is seen.  Abdominal aorta:  No aneurysm identified.  IMPRESSION: Right-sided hydronephrosis.   Original Report Authenticated By: Alcide Clever, M.D.    Scheduled Meds: . heparin  5,000 Units Subcutaneous Q8H  . sodium chloride  3 mL  Intravenous Q12H  . vancomycin  750 mg Intravenous Q12H   Continuous Infusions: . sodium chloride 100 mL/hr at 01/12/13 0547  . 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 If 7PM-7AM, please contact night-coverage at [email or web address], password [facility] 01/12/2013, 11:07 AM  LOS: 2 days

## 2013-01-12 NOTE — Progress Notes (Signed)
She continues to feel better. She is scheduled for a ureter stent placement today.  I reviewed the case with interventional radiology. It is not clear whether the pelvic mass is arising from the uterus, ovaries, or bowel. There could be an associated abscess.  Interventional radiology recommends obtaining a contrast enhanced abdomen/pelvic CT to better distinguish the Association of the mass with bowel.  There is a significant likelihood she has a GYN malignancy. I asked GYN oncology to see her today.  We will arrange for outpatient followup in medical oncology or GYN oncology based on the evaluation today.

## 2013-01-12 NOTE — Patient Instructions (Signed)
Return appointment on 01/19/2013.

## 2013-01-12 NOTE — Progress Notes (Signed)
Spoke with cancer center regarding their need to see this patient for a oncology gyn appointment.  We discussed patient's pending surgery and being scheduled for pickup by the OR between 11-12 today.

## 2013-01-12 NOTE — Interval H&P Note (Signed)
History and Physical Interval Note:  01/12/2013 12:41 PM  Kirsten Schroeder  has presented today for surgery, with the diagnosis of RIGHT HYDRONEPHROSIS  The various methods of treatment have been discussed with the patient and family. After consideration of risks, benefits and other options for treatment, the patient has consented to  Procedure(s): CYSTOSCOPY WITH RETROGRADE PYELOGRAM/ RIGHT DOUBLE J STENT PLACEMENT (Right) as a surgical intervention .  The patient's history has been reviewed, patient examined, no change in status, stable for surgery.  I have reviewed the patient's chart and labs.  Questions were answered to the patient's satisfaction.     Chelsea Aus

## 2013-01-12 NOTE — Progress Notes (Signed)
ANTIBIOTIC CONSULT NOTE - FOLLOW UP  Pharmacy Consult for vancomycin Indication: enterococcal UTI  Allergies  Allergen Reactions  . Penicillins     REACTION: RASH    Patient Measurements: Height: 5\' 1"  (154.9 cm) Weight: 136 lb 8 oz (61.916 kg) IBW/kg (Calculated) : 47.8   Vital Signs: Temp: 99 F (37.2 C) (08/22 0843) Temp src: Oral (08/22 0843) BP: 107/66 mmHg (08/22 0843) Pulse Rate: 85 (08/22 0843) Intake/Output from previous day: 08/21 0701 - 08/22 0700 In: 2450 [I.V.:2450] Out: 500 [Urine:500]    Labs:  Recent Labs  01/10/13 1135 01/10/13 1708 01/11/13 0430  WBC 15.2*  --  10.7*  HGB 11.1*  --  9.4*  PLT 453.0*  --  350  CREATININE  --  0.74 0.54   Estimated Creatinine Clearance: 65.4 ml/min (by C-G formula based on Cr of 0.54).   Microbiology: 8/18 urine: 80K enterococcus sp (R=tetracycline, S=ampicillin, levofloxacin, nitrofurantoin, vancomycin) 8/20 urine: 30K multiple bacterial morphotypes, none predominant  Anti-infectives: 8/20 >> vancomycin >>  Assessment: 36 yoF admitted 8/20 with ureteral obstruction and hydronephrosis. Urine culture from 8/18 with enterococcus. Hx of Crohn's disease s/p colectomy with ileostomy.  Patient is scheduled for right ureter stent placement at 1300 today  Tm/24h = 99.7 (no APAP)  WBC elevated on admit to 15.2, down to 10.7 on 8/21, no new CBC today   SCr 0.54 on 8/21, stable, UOP not accurately charted. CrCl ~65 mL/min  Will aim for higher trough due to high vancomycin MIC (=2)   Goal of Therapy:  Vancomycin trough level 15-20 mcg/ml  Plan:  - continue vancomycin 750mg  IV q12h - f/u antibiotic de-escalation and length of therapy - f/u after stent placement for urology recommendations  Thank you for the consult.  Tomi Bamberger, PharmD Clinical Pharmacist Pager: [phone] Pharmacy: [phone] 01/12/2013 9:41 AM

## 2013-01-12 NOTE — Op Note (Signed)
Preoperative diagnosis: Malignant right hydronephrosis  Postoperative diagnosis: Same   Procedure: Cystoscopy, right retrograde ureteropyelogram, double-J stent placement on right (difficult)    Surgeon: Bertram Millard. Raniya Golembeski, M.D.   Anesthesia: Gen.   Complications: None  Specimen(s): None  Drain(s): 24 cm x 5 [place] Polaris  Indications: 57 year-old female with newly diagnosed pelvic mass and subsequent adenopathy in the retroperitoneum and hydronephrosis. Urologic consultation is requested for stent placement.    Technique and findings: The patient was properly identified and marked in the holding area. She was taken the operating room where general anesthetic was administered with LMA. She was placed in the dorsolithotomy position. Genitalia and perineum were prepped and draped. Proper timeout was then performed.  I then placed a cystoscope in the bladder. Urothelium was all normal. The trigone was somewhat distorted, with deviation somewhat to the left. The right ureteral orifice was angulated somewhat, making catheter placement difficult in the right ureteral orifice. Following adequate inspection of the bladder, a 6 [place] open-ended catheter was placed with the help of a guidewire in the right distal ureter. Retrograde was performed. This revealed leftward deviation of the ureter with significant pelvic narrowing of the ureter proximal hydroureteronephrosis. I then placed a sensor-tip guidewire through the open-ended catheter, with good curl seen in the renal pelvis. I tried several times to get it 7 [place] double-J stent over this guidewire, this was quite difficult with no evidence of passage of the stent more proximally than the mid ureter. I then exchanged the sensor-tip with a rigid guidewire, and again was unable to negotiate either a 7 or a 6 [place] contour stent. I then tried a 5 [place] Polaris stent, again this was difficult to negotiate. I then it finally exchanged the  superstiff guidewire with a Teflon guidewire. I was then able to negotiate a 5 [place] Polaris stent up proximally, with an eventual curl seen in the renal pelvis once a guidewire was removed. The bladder was then drained and the procedure terminated. The patient was awakened and taken to PACU in stable condition.  Approximately 45 minutes was spent trying to place a stent.

## 2013-01-12 NOTE — H&P (View-Only) (Signed)
Urology Consult   Physician requesting consult: J. Gardner, DO  Reason for consult: Right hydronephrosis, UTI  History of Present Illness: Kirsten Schroeder is a 57 y.o. female who was admitted yesterday with newly diagnosed pelvic mass with retroperitoneal adenopathy, a UTI and right hydronephrosis. The patient experienced nausea and vomiting earlier in the week, as well as a fever of 103. She has had no abdominal or flank pain. She has had dark urine, but has not had dysuria, gross hematuria, and denies any prior history of urinary tract infections within the past several years. She denies significant lower urinary tract symptomatology. She had an abdominal ultrasound and CT of the abdomen and pelvis yesterday which revealed mild right hydronephrosis, and a significantly large pelvic mass and adenopathy. She is admitted for further evaluation.  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 She has an ileostomy, with subsequent parastomal hernia that is relatively asymptomatic. She is followed by Dr. Jacobs.  She denies a history of voiding or storage urinary symptoms, hematuria, UTIs, STDs, urolithiasis, GU malignancy/trauma/surgery.  Past Medical History  Diagnosis Date  . Hypertension   . Colitis     [place]  . GI bleed     Past Surgical History  Procedure Laterality Date  . Ileostomy  12/27/1988    Removal of Large Intestine---Tim Davis, surgeon  . Cholecystectomy  1991-1992     Current Hospital Medications: Scheduled Meds: . heparin  5,000 Units Subcutaneous Q8H  . sodium chloride  3 mL Intravenous Q12H  . vancomycin  750 mg Intravenous Q12H   Continuous Infusions: . sodium chloride 100 mL/hr (01/11/13 0818)   PRN Meds:.ondansetron  Allergies:  Allergies  Allergen Reactions  . Penicillins     REACTION: RASH    Family History  Problem Relation Age of Onset  . Hypertension Mother   . Arthritis Mother   .  Hypertension Father   . Diabetes Father   . Hypertension Brother   . Diabetes Brother     Social History:  reports that she has never smoked. She has never used smokeless tobacco. She reports that she does not drink alcohol or use illicit drugs.  ROS: A complete review of systems was performed.  All systems are negative except for pertinent findings as noted.  Physical Exam:  Vital signs in last 24 hours: Temp:  [98.6 F (37 C)-99.4 F (37.4 C)] 98.7 F (37.1 C) (08/21 0605) Pulse Rate:  [89-110] 89 (08/21 0605) Resp:  [16-20] 20 (08/21 0605) BP: (97-110)/(48-64) 110/62 mmHg (08/21 0605) SpO2:  [98 %-100 %] 98 % (08/21 0605) Weight:  [61.916 kg (136 lb 8 oz)] 61.916 kg (136 lb 8 oz) (08/20 2050) General:  Alert and oriented, No acute distress HEENT: Normocephalic, atraumatic Neck: No JVD or lymphadenopathy Cardiovascular: Regular rate and rhythm Lungs: Clear bilaterally Abdomen: Soft, nontender, nondistended, no abdominal or flank masses. There is a large irreducible peristomal hernia that is nontender. Stoma appears normal. Back: No CVA tenderness Extremities: No edema Neurologic: Grossly intact  Laboratory Data:   Recent Labs  01/10/13 1135 01/11/13 0430  WBC 15.2* 10.7*  HGB 11.1* 9.4*  HCT 32.5* 28.6*  PLT 453.0* 350     Recent Labs  01/10/13 1708 01/11/13 0430  NA 128* 132*  K 3.1* 3.9  CL 89* 97  GLUCOSE 115* 101*  BUN 14 11  CALCIUM 9.4 9.0  CREATININE 0.74 0.54     Results for orders placed during the   hospital encounter of 01/10/13 (from the past 24 hour(s))  BASIC METABOLIC PANEL     Status: Abnormal   Collection Time    01/10/13  5:08 PM      Result Value Range   Sodium 128 (*) 135 - 145 mEq/L   Potassium 3.1 (*) 3.5 - 5.1 mEq/L   Chloride 89 (*) 96 - 112 mEq/L   CO2 26  19 - 32 mEq/L   Glucose, Bld 115 (*) 70 - 99 mg/dL   BUN 14  6 - 23 mg/dL   Creatinine, Ser 0.74  0.50 - 1.10 mg/dL   Calcium 9.4  8.4 - 10.5 mg/dL   GFR calc non Af  Amer >90  >90 mL/min   GFR calc Af Amer >90  >90 mL/min  PROTIME-INR     Status: None   Collection Time    01/10/13  5:55 PM      Result Value Range   Prothrombin Time 14.7  11.6 - 15.2 seconds   INR 1.17  0.00 - 1.49  URINALYSIS, ROUTINE W REFLEX MICROSCOPIC     Status: Abnormal   Collection Time    01/10/13  6:46 PM      Result Value Range   Color, Urine YELLOW  YELLOW   APPearance CLOUDY (*) CLEAR   Specific Gravity, Urine 1.019  1.005 - 1.030   pH 5.0  5.0 - 8.0   Glucose, UA NEGATIVE  NEGATIVE mg/dL   Hgb urine dipstick TRACE (*) NEGATIVE   Bilirubin Urine NEGATIVE  NEGATIVE   Ketones, ur NEGATIVE  NEGATIVE mg/dL   Protein, ur NEGATIVE  NEGATIVE mg/dL   Urobilinogen, UA 0.2  0.0 - 1.0 mg/dL   Nitrite NEGATIVE  NEGATIVE   Leukocytes, UA MODERATE (*) NEGATIVE  URINE MICROSCOPIC-ADD ON     Status: Abnormal   Collection Time    01/10/13  6:46 PM      Result Value Range   Squamous Epithelial / LPF FEW (*) RARE   WBC, UA 3-6  <3 WBC/hpf   RBC / HPF 0-2  <3 RBC/hpf   Bacteria, UA FEW (*) RARE   Urine-Other MUCOUS PRESENT    CBC     Status: Abnormal   Collection Time    01/11/13  4:30 AM      Result Value Range   WBC 10.7 (*) 4.0 - 10.5 K/uL   RBC 3.25 (*) 3.87 - 5.11 MIL/uL   Hemoglobin 9.4 (*) 12.0 - 15.0 g/dL   HCT 28.6 (*) 36.0 - 46.0 %   MCV 88.0  78.0 - 100.0 fL   MCH 28.9  26.0 - 34.0 pg   MCHC 32.9  30.0 - 36.0 g/dL   RDW 12.8  11.5 - 15.5 %   Platelets 350  150 - 400 K/uL  BASIC METABOLIC PANEL     Status: Abnormal   Collection Time    01/11/13  4:30 AM      Result Value Range   Sodium 132 (*) 135 - 145 mEq/L   Potassium 3.9  3.5 - 5.1 mEq/L   Chloride 97  96 - 112 mEq/L   CO2 26  19 - 32 mEq/L   Glucose, Bld 101 (*) 70 - 99 mg/dL   BUN 11  6 - 23 mg/dL   Creatinine, Ser 0.54  0.50 - 1.10 mg/dL   Calcium 9.0  8.4 - 10.5 mg/dL   GFR calc non Af Amer >90  >90 mL/min   GFR calc Af Amer >  90  >90 mL/min   Recent Results (from the past 240 hour(s))  URINE  CULTURE     Status: None   Collection Time    01/08/13  4:37 PM      Result Value Range Status   Culture ENTEROCOCCUS SPECIES   Final   Colony Count 80,000 COLONIES/ML   Final   Organism ID, Bacteria ENTEROCOCCUS SPECIES   Final    Renal Function:  Recent Labs  01/05/13 1616 01/10/13 1708 01/11/13 0430  CREATININE 0.77 0.74 0.54   Estimated Creatinine Clearance: 65.4 ml/min (by C-G formula based on Cr of 0.54).  Radiologic Imaging: Ct Abdomen Pelvis Wo Contrast  01/10/2013   CLINICAL DATA:  Right flank pain. Hydronephrosis. Hematuria. Leukocytosis.  EXAM: CT ABDOMEN AND PELVIS WITHOUT CONTRAST  TECHNIQUE: Multidetector CT imaging of the abdomen and pelvis was performed following the standard protocol without intravenous contrast.  COMPARISON:  None  FINDINGS: Subsegmental atelectasis, right lower lobe and lingula. The noncontrast CT appearance of the liver, spleen, pancreas, and adrenal glands is within normal limits. Gallbladder surgically absent. Left kidney unremarkable.  Moderate right hydronephrosis noted with hydroureter to the level of the iliac vessel cross over. Right-sided ostomy with peristomal hernia containing adipose tissue and loops of bowel, without specific findings of strangulation or obstruction.  However, there is also a large mass inseparable from the uterus with cystic and solid elements, and with speckled gas density along its upper margin which may be from adherent bowel or extraluminal gas. Combined length of the uterus and mass is approximately 16.0 cm from the cervix to the top of the mass. Adjacent mesenteric stranding noted.  There is also a retroperitoneal and pelvic adenopathy including a 1.2 cm left periaortic node on image 34 of series 2, a 1.3 cm AP window lymph node on image 45 of series 2, a 1.7 cm right external iliac lymph node on image 67 of series 2, a 1.6 cm right inguinal lymph node on image 73 of series 2, and a 1.8 cm left inguinal lymph node on image  72 of series 2, among others. There are clips in the expected location of the rectum, along with presacral stranding which is probably postoperative.  IMPRESSION: 1. Retroperitoneal and pelvic pathologic adenopathy, irregular gas density along the anterior upper margin of this heterogeneous mass. Top differential diagnostic considerations include GI stromal tumor; uterine sarcoma or endometrial carcinoma; lymphoma; or colon cancer. Correlate with patient history. The irregular gas along the anterior margin could represent an adherent bowel, abscess, or gas in a GI stromal tumor. This mass appears to be causing extrinsic mass effect on the ureter leading to the moderate degree of right hydronephrosis. 2. 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   Electronically Signed   By: Walt  Liebkemann   On: 01/10/2013 18:18   Us Abdomen Complete  01/10/2013   *RADIOLOGY REPORT*  Clinical Data:  Pain  COMPLETE ABDOMINAL ULTRASOUND  Comparison:  None.  Findings:  Gallbladder:  Surgically removed.  Common bile duct:  3.5 mm.  Liver:  Increased echogenicity likely related to fatty infiltration.  No focal mass lesion is seen.  IVC:  Appears normal.  Pancreas:  No focal abnormality seen.  Spleen:  9.8 cm in length.  No mass lesion is noted.  Right Kidney:  10.1 cm in length.  Mild hydronephrosis is noted.  Left Kidney:  11 cm in length.  No hydronephrosis or mass lesion is seen.  Abdominal aorta:  No   aneurysm identified.  IMPRESSION: Right-sided hydronephrosis.   Original Report Authenticated By: Mark Lukens, M.D.    I independently reviewed the above imaging studies.  Impression/Assessment:  1. Newly diagnosed pelvic mass  2. Subsequent mild right hydroureteronephrosis from the mass as well as possible adenopathy in the retroperitoneum. The patient is not currently symptomatic  3. Urinary tract infection. The patient does have a recent history of fever to 103, but she has no perinephric  stranding on the right, no CVA tenderness, and is currently afebrile and treated for her enterococcal UTI. More than likely, her enterococcal UTI is from her lower urinary tract.  Plan:  1. I have discussed the findings of the CT scan and subsequent hydronephrosis with the patient and her daughter, who was in the room today  2. As she does not have evidence of a florid pyelonephritis, and is currently on vancomycin, I think we can hold off on stent placement today, but get this scheduled for next available time. I have discontinued the patient's n.p.o. order.  3. Continue vancomycin  4. I will see about getting a stent placed on the right side tomorrow. I have discussed the procedure with the patient and her daughter.     

## 2013-01-13 ENCOUNTER — Encounter: Payer: Self-pay | Admitting: Gynecology

## 2013-01-13 DIAGNOSIS — D490 Neoplasm of unspecified behavior of digestive system: Secondary | ICD-10-CM

## 2013-01-13 DIAGNOSIS — D72829 Elevated white blood cell count, unspecified: Secondary | ICD-10-CM

## 2013-01-13 DIAGNOSIS — R799 Abnormal finding of blood chemistry, unspecified: Secondary | ICD-10-CM

## 2013-01-13 DIAGNOSIS — D649 Anemia, unspecified: Secondary | ICD-10-CM

## 2013-01-13 LAB — BASIC METABOLIC PANEL
BUN: 8 mg/dL (ref 6–23)
CO2: 24 mEq/L (ref 19–32)
Calcium: 8.7 mg/dL (ref 8.4–10.5)
Creatinine, Ser: 0.65 mg/dL (ref 0.50–1.10)
GFR calc non Af Amer: >90 mL/min (ref >90)
Glucose, Bld: 133 mg/dL — ABNORMAL HIGH (ref 70–99)
Sodium: 134 mEq/L — ABNORMAL LOW (ref 135–145)

## 2013-01-13 LAB — CBC
MCH: 30 pg (ref 26.0–34.0)
MCHC: 33.8 g/dL (ref 30.0–36.0)
MCV: 88.7 fL (ref 78.0–100.0)
Platelets: 437 10*3/uL — ABNORMAL HIGH (ref 150–400)
RBC: 3.27 MIL/uL — ABNORMAL LOW (ref 3.87–5.11)

## 2013-01-13 MED ORDER — POTASSIUM CHLORIDE CRYS ER 20 MEQ PO TBCR
40.0000 meq | EXTENDED_RELEASE_TABLET | Freq: Once | ORAL | Status: AC
  Administered 2013-01-13: 40 meq via ORAL
  Filled 2013-01-13: qty 2

## 2013-01-13 MED ORDER — LEVOFLOXACIN IN D5W 750 MG/150ML IV SOLN
750.0000 mg | INTRAVENOUS | Status: DC
  Administered 2013-01-13 – 2013-01-16 (×4): 750 mg via INTRAVENOUS
  Filled 2013-01-13 (×5): qty 150

## 2013-01-13 MED ORDER — METRONIDAZOLE IN NACL 5-0.79 MG/ML-% IV SOLN
500.0000 mg | Freq: Three times a day (TID) | INTRAVENOUS | Status: DC
  Administered 2013-01-13 – 2013-01-17 (×13): 500 mg via INTRAVENOUS
  Filled 2013-01-13 (×14): qty 100

## 2013-01-13 NOTE — Progress Notes (Signed)
[facility] PROGRESS NOTE  Kirsten Schroeder ZOX:[id number] DOB: [birth year] DOA: 01/10/2013 PCP: Loreen Freud, DO  Assessment/Plan:  1-Hydronephrosis, right: obstructing mass to right ureter. Patient S/P stent placement by Dr Retta Diones on 8-22. Renal function stable.   2-Sepsis due to enterococcus UTI: UA with 3 to 6 WBC. Follow repeat urine culture with only 30,000 colonies. Urine culture from 8-18 grew enterococcus. Received vancomycin for 2 days. Patient presents with leukocytosis, fever, mild hypotension. Improving. WBC increase to 15.  Day 3 of antibiotics. Will change vancomycin to Levaquin, enterococcus was sensitive to Levaquin.   3-Hyponatremia: Improved with IV fluids.   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  -Surgery consulted.  -Will start antibiotics due to fistula. Flagyl and Levaquin.  -oncologist informed of CT scan results.  -Will try to contact GYN.   5-Leukocytosis: could be secondary to UTI, but also due to small bowel fistula into the mass will star Zosyn.  6-Hypokalemia: replete with 40 meq po times one.  7-Anemia: monitor hb.    Code Status: Full Code.  Family Communication: Care discussed with patient.  Disposition Plan: to be determine   Consultants:  Dr Truett Perna.   Dr Retta Diones  Procedures:  none  Antibiotics:  Vancomycin.   HPI/Subjective: Feeling well, no complaints.   Objective: Filed Vitals:   01/13/13 0603  BP: 145/70  Pulse: 81  Temp: 98.1 F (36.7 C)  Resp: 16    Intake/Output Summary (Last 24 hours) at 01/13/13 0957 Last data filed at 01/12/13 1800  Gross per 24 hour  Intake    700 ml  Output      1 ml  Net    699 ml    Filed Weights   01/10/13 2050  Weight: 61.916 kg (136 lb 8 oz)    Exam:   General:  No distress.   Cardiovascular: S 1, S 2 RRR  Respiratory: CTA  Abdomen: BS present, ileostomy in place,   Musculoskeletal: no edema.   Data Reviewed: Basic Metabolic Panel:  Recent Labs Lab 01/10/13 1708 01/11/13 0430 01/12/13 1110 01/13/13 0812  NA 128* 132* 135 134*  K 3.1* 3.9 3.4* 3.3*  CL 89* 97 102 102  CO2 26 26 25 24   GLUCOSE 115* 101* 97 133*  BUN 14 11 6 8   CREATININE 0.74 0.54 0.55 0.65  CALCIUM 9.4 9.0 9.1 8.7   Liver Function Tests:  Recent Labs Lab 01/10/13 1135  AST 18  ALT 13  ALKPHOS 66  BILITOT 0.9  PROT 7.8  ALBUMIN 3.2*   No results found for this basename: LIPASE, AMYLASE,  in the last 168 hours No results found for this basename: AMMONIA,  in the last 168 hours CBC:  Recent Labs Lab 01/10/13 1135 01/11/13 0430 01/12/13 1110 01/13/13 0812  WBC 15.2* 10.7* 12.0* 15.3*  NEUTROABS 12.5*  --   --   --   HGB 11.1* 9.4* 10.5* 9.8*  HCT 32.5* 28.6* 32.0* 29.0*  MCV 87.7 88.0 89.6 88.7  PLT 453.0* 350 464* 437*   Cardiac Enzymes: No results found for this basename: CKTOTAL, CKMB, CKMBINDEX, TROPONINI,  in the last 168 hours BNP (last 3 results) No results found for this basename: PROBNP,  in the last 8760 hours CBG: No results found for  this basename: GLUCAP,  in the last 168 hours  Recent Results (from the past 240 hour(s))  URINE CULTURE     Status: None   Collection Time    01/08/13  4:37 PM      Result Value Range Status   Culture ENTEROCOCCUS SPECIES   Final   Colony Count 80,000 COLONIES/ML   Final   Organism ID, Bacteria ENTEROCOCCUS SPECIES   Final  URINE CULTURE     Status: None   Collection Time    01/10/13  6:46 PM      Result Value Range Status   Specimen Description URINE, CLEAN CATCH   Final   Special Requests NONE   Final   Culture  Setup Time     Final   Value: 01/11/2013 01:14     Performed at [facility] Count     Final   Value: 30,000 COLONIES/ML     Performed at [facility]   Culture     Final   Value: Multiple bacterial morphotypes present, none predominant. Suggest appropriate recollection if clinically indicated.     Performed at [facility]   Report Status 01/11/2013 FINAL   Final  SURGICAL PCR SCREEN     Status: None   Collection Time    01/12/13  7:37 AM      Result Value Range Status   MRSA, PCR NEGATIVE  NEGATIVE Final   Staphylococcus aureus NEGATIVE  NEGATIVE Final   Comment:            The Xpert SA Assay (FDA     approved for NASAL specimens     in patients over 57 years of age),     is one component of     a comprehensive surveillance     program.  Test performance has     been validated by [facility] for patients greater     than or equal to 57 year old.     It is not intended     to diagnose infection nor to     guide or monitor treatment.     Studies: Dg Chest 2 View  01/11/2013   CLINICAL DATA:  Preop abdominal mass.  EXAM: CHEST  2 VIEW  COMPARISON:  None.  FINDINGS: Preop abdominal mass.  IMPRESSION: No active cardiopulmonary disease.   Electronically Signed   By: Charlett Nose   On: 01/11/2013 14:17   Ct Abdomen Pelvis W Contrast  01/12/2013   CLINICAL DATA:  Followup pelvic mass is and right hydronephrosis.  EXAM: CT ABDOMEN AND PELVIS WITH CONTRAST  TECHNIQUE: Multidetector CT imaging of the abdomen and pelvis was performed using the standard protocol following bolus administration of intravenous contrast.  CONTRAST:  OMNIPAQUE IOHEXOL 300 MG/ML  SOLN  COMPARISON:  Noncontrast CT dated 01/10/2013.  FINDINGS: Interval right ureteral stent with associated air in the right renal collecting system. Mild improvement in dilatation of the collecting system with moderate dilatation remaining.  A large, heterogeneous, multiloculated pelvic mass is again demonstrated. There are adjacent surgical clips posteriorly. The mass measures  12.8 x 10.5 cm on image number 69 with an adjacent right external iliac lymph node with a short axis diameter of 1.9 cm. The mass measures 12.3 cm in length on sagittal image number 87. The uterus and ovaries are not identified separate from the mass. There is gas and contrast in the anterior aspect of the mass, superiorly,  with a small bowel loop, containing contrast, extending into that area. There is a small fistula between the small bowel and the mass seen on sagittal image number 93.  There are multiple additional enlarged lymph nodes. These include a 1.4 cm short axis obturator lymph node on the right on image number 69, on 1.7 cm short axis right inguinal lymph node on image number 75, 1.6 cm short axis left inguinal lymph node on image number 75, 1.4 cm right pelvic mesenteric lymph node on image number 56, multiple additional inferior mesenteric enlarged lymph nodes, multiple enlarged retroperitoneal lymph nodes, including a 1.1 cm short axis left para-aortic node on image number 35 and 1.4 cm short axis aortocaval lymph node on image number 34.  A large right parastomal hernia is again demonstrated containing herniated small bowel without obstruction. There is also an additional mass within the herniated mesentery, measuring 3.2 x 2.7 cm on image number 62.  There is a small ventral hernia to the left of a midline surgical scar at the level of the mid to upper pelvis, containing herniated small bowel without obstruction. There is also small amount of fluid within this hernia.  Mild diffuse low density of the liver relative to the spleen. The spleen, pancreas, adrenal glands, left kidney and urinary bladder are unremarkable. Cholecystectomy clips.  A small amount of atelectasis and scarring at the lung bases is unchanged. Lumbar lower thoracic spine degenerative changes are noted.  IMPRESSION: 1. 12.8 x 12.3 x 10.5 cm heterogeneous pelvic mass, as described above. Differential considerations include uterine  sarcoma, ovarian carcinoma and intestinal malignancy.  2. Small bowel fistula into the anterior, superior aspect of the mass, as described above.  3.  Metastatic pelvic, mesenteric and retroperitoneal adenopathy.  4. 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  5. Mildly improved right hydronephrosis following ureteral stent placement.  6. Small left ventral hernia containing hernia small bowel without obstruction.  7. Mild hepatic steatosis.   Electronically Signed   By: Gordan Payment   On: 01/12/2013 19:42    Scheduled Meds: . heparin  5,000 Units Subcutaneous Q8H  . potassium chloride  40 mEq Oral Once  . sodium chloride  3 mL Intravenous Q12H  . vancomycin  750 mg Intravenous Q12H   Continuous Infusions: . 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 If 7PM-7AM, please contact night-coverage at [email or web address], password [facility] 01/13/2013, 9:57 AM  LOS: 3 days

## 2013-01-13 NOTE — Consult Note (Signed)
Reason for Consult:pelvic mass Referring Physician: Carmell Austria MD  Kirsten Schroeder is an 57 y.o. female.  HPI: Asked to see pt due to recently diagnosed pelvic mass.  15 lbs wt loss since may.  Developed N/V and fever tHis past WEEK.   Admitted for UTI but had hydronephrosis.  CT shows large pelvic mass with questionable fistulization to SB.  Patient describes persistent nausea since May THAT COMES AND GOES.large pelvic mass causing obstruction of right ureter. Stent was placed by urology. Asked to see at the request DR REGALADO.  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 SHE DOES NOT HAVE COLON OR RECTUM.  Past Medical History  Diagnosis Date  . Hypertension   . Colitis     [place]  . GI bleed     Past Surgical History  Procedure Laterality Date  . Ileostomy  12/27/1988    Removal of Large Intestine---Tim Earlene Plater, Careers adviser  . Cholecystectomy  1991-1992    Family History  Problem Relation Age of Onset  . Hypertension Mother   . Arthritis Mother   . Hypertension Father   . Diabetes Father   . Hypertension Brother   . Diabetes Brother     Social History:  reports that she has never smoked. She has never used smokeless tobacco. She reports that she does not drink alcohol or use illicit drugs.  Allergies:  Allergies  Allergen Reactions  . Penicillins     REACTION: RASH    Medications: I have reviewed the patient's current medications.  Results for orders placed during the hospital encounter of 01/10/13 (from the past 48 hour(s))  SURGICAL PCR SCREEN     Status: None   Collection Time    01/12/13  7:37 AM      Result Value Range   MRSA, PCR NEGATIVE  NEGATIVE   Staphylococcus aureus NEGATIVE  NEGATIVE   Comment:            The Xpert SA Assay (FDA     approved for NASAL specimens     in patients over 67 years of age),     is one component of     a comprehensive surveillance     program.  Test performance has     been validated by [employer] for patients greater     than or equal to 34 year old.     It is not intended     to diagnose infection nor to     guide or monitor treatment.  CBC     Status: Abnormal   Collection Time    01/12/13 11:10 AM      Result Value Range   WBC 12.0 (*) 4.0 - 10.5 K/uL   RBC 3.57 (*) 3.87 - 5.11 MIL/uL   Hemoglobin 10.5 (*) 12.0 - 15.0 g/dL   HCT 40.9 (*) 81.1 - 91.4 %   MCV 89.6  78.0 - 100.0 fL   MCH 29.4  26.0 - 34.0 pg   MCHC 32.8  30.0 - 36.0 g/dL   RDW 78.2  95.6 - 21.3 %   Platelets 464 (*) 150 - 400 K/uL   Comment: REPEATED TO VERIFY     DELTA CHECK NOTED  BASIC METABOLIC PANEL     Status: Abnormal   Collection Time    01/12/13 11:10 AM      Result Value Range   Sodium 135  135 - 145 mEq/L   Potassium 3.4 (*) 3.5 -  5.1 mEq/L   Chloride 102  96 - 112 mEq/L   CO2 25  19 - 32 mEq/L   Glucose, Bld 97  70 - 99 mg/dL   BUN 6  6 - 23 mg/dL   Creatinine, Ser 1.61  0.50 - 1.10 mg/dL   Calcium 9.1  8.4 - 09.6 mg/dL   GFR calc non Af Amer >90  >90 mL/min   GFR calc Af Amer >90  >90 mL/min   Comment: (NOTE)     The eGFR has been calculated using the CKD EPI equation.     This calculation has not been validated in all clinical situations.     eGFR's persistently <90 mL/min signify possible Chronic Kidney     Disease.  BASIC METABOLIC PANEL     Status: Abnormal   Collection Time    01/13/13  8:12 AM      Result Value Range   Sodium 134 (*) 135 - 145 mEq/L   Potassium 3.3 (*) 3.5 - 5.1 mEq/L   Chloride 102  96 - 112 mEq/L   CO2 24  19 - 32 mEq/L   Glucose, Bld 133 (*) 70 - 99 mg/dL   BUN 8  6 - 23 mg/dL   Creatinine, Ser 0.45  0.50 - 1.10 mg/dL   Calcium 8.7  8.4 - 40.9 mg/dL   GFR calc non Af Amer >90  >90 mL/min   GFR calc Af Amer >90  >90 mL/min   Comment: (NOTE)     The eGFR has been calculated using the CKD EPI equation.     This calculation has not been validated in all clinical situations.     eGFR's persistently <90 mL/min signify possible Chronic Kidney      Disease.  CBC     Status: Abnormal   Collection Time    01/13/13  8:12 AM      Result Value Range   WBC 15.3 (*) 4.0 - 10.5 K/uL   Comment: WHITE COUNT CONFIRMED ON SMEAR   RBC 3.27 (*) 3.87 - 5.11 MIL/uL   Hemoglobin 9.8 (*) 12.0 - 15.0 g/dL   HCT 81.1 (*) 91.4 - 78.2 %   MCV 88.7  78.0 - 100.0 fL   MCH 30.0  26.0 - 34.0 pg   MCHC 33.8  30.0 - 36.0 g/dL   RDW 95.6  21.3 - 08.6 %   Platelets 437 (*) 150 - 400 K/uL    Dg Chest 2 View  01/11/2013   CLINICAL DATA:  Preop abdominal mass.  EXAM: CHEST  2 VIEW  COMPARISON:  None.  FINDINGS: Preop abdominal mass.  IMPRESSION: No active cardiopulmonary disease.   Electronically Signed   By: Charlett Nose   On: 01/11/2013 14:17   Ct Abdomen Pelvis W Contrast  01/12/2013   CLINICAL DATA:  Followup pelvic mass is and right hydronephrosis.  EXAM: CT ABDOMEN AND PELVIS WITH CONTRAST  TECHNIQUE: Multidetector CT imaging of the abdomen and pelvis was performed using the standard protocol following bolus administration of intravenous contrast.  CONTRAST:  OMNIPAQUE IOHEXOL 300 MG/ML  SOLN  COMPARISON:  Noncontrast CT dated 01/10/2013.  FINDINGS: Interval right ureteral stent with associated air in the right renal collecting system. Mild improvement in dilatation of the collecting system with moderate dilatation remaining.  A large, heterogeneous, multiloculated pelvic mass is again demonstrated. There are adjacent surgical clips posteriorly. The mass measures 12.8 x 10.5 cm on image number 69 with an adjacent right external iliac lymph  node with a short axis diameter of 1.9 cm. The mass measures 12.3 cm in length on sagittal image number 87. The uterus and ovaries are not identified separate from the mass. There is gas and contrast in the anterior aspect of the mass, superiorly, with a small bowel loop, containing contrast, extending into that area. There is a small fistula between the small bowel and the mass seen on sagittal image number 93.  There are  multiple additional enlarged lymph nodes. These include a 1.4 cm short axis obturator lymph node on the right on image number 69, on 1.7 cm short axis right inguinal lymph node on image number 75, 1.6 cm short axis left inguinal lymph node on image number 75, 1.4 cm right pelvic mesenteric lymph node on image number 56, multiple additional inferior mesenteric enlarged lymph nodes, multiple enlarged retroperitoneal lymph nodes, including a 1.1 cm short axis left para-aortic node on image number 35 and 1.4 cm short axis aortocaval lymph node on image number 34.  A large right parastomal hernia is again demonstrated containing herniated small bowel without obstruction. There is also an additional mass within the herniated mesentery, measuring 3.2 x 2.7 cm on image number 62.  There is a small ventral hernia to the left of a midline surgical scar at the level of the mid to upper pelvis, containing herniated small bowel without obstruction. There is also small amount of fluid within this hernia.  Mild diffuse low density of the liver relative to the spleen. The spleen, pancreas, adrenal glands, left kidney and urinary bladder are unremarkable. Cholecystectomy clips.  A small amount of atelectasis and scarring at the lung bases is unchanged. Lumbar lower thoracic spine degenerative changes are noted.  IMPRESSION: 1. 12.8 x 12.3 x 10.5 cm heterogeneous pelvic mass, as described above. Differential considerations include uterine sarcoma, ovarian carcinoma and intestinal malignancy.  2. Small bowel fistula into the anterior, superior aspect of the mass, as described above.  3.  Metastatic pelvic, mesenteric and retroperitoneal adenopathy.  4. 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  5. Mildly improved right hydronephrosis following ureteral stent placement.  6. Small left ventral hernia containing hernia small bowel without obstruction.  7. Mild  hepatic steatosis.   Electronically Signed   By: Gordan Payment   On: 01/12/2013 19:42    Review of Systems  Constitutional: Positive for weight loss.  HENT: Negative.   Eyes: Negative.   Respiratory: Negative.   Cardiovascular: Negative.   Gastrointestinal: Positive for vomiting. Negative for abdominal pain.  Genitourinary: Positive for dysuria and flank pain.  Musculoskeletal: Negative.   Skin: Negative.   Neurological: Positive for weakness.  Endo/Heme/Allergies: Negative.   Psychiatric/Behavioral: Negative.    Blood pressure 145/70, pulse 81, temperature 98.1 F (36.7 C), temperature source Oral, resp. rate 16, height 5\' 1"  (1.549 m), weight 136 lb 8 oz (61.916 kg), SpO2 99.00%. Physical Exam  Constitutional: She appears well-developed and well-nourished.  HENT:  Head: Normocephalic and atraumatic.  Eyes: EOM are normal. Pupils are equal, round, and reactive to light.  Neck: Normal range of motion. Neck supple.  Cardiovascular: Normal rate.   Respiratory: Effort normal.  GI: There is no tenderness.      Assessment/Plan: LARGE PELVIC MASS  Mesentery mass noted   ? Carcinomatosis?   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   Will follow up on Monday.  Ok to feed.   Davetta Olliff A. 01/13/2013,  11:05 AM

## 2013-01-13 NOTE — Progress Notes (Signed)
IR aware of request for biopsy of pelvic mass. Dr. Lowella Dandy has discussed with Dr. Truett Perna. Pt now has had right JJ stent and CT Abd/Pelvis with contrast. Dr. Has reviewed CT. Large complex mass in pelvis with small bowel fistula into the ant/sup aspect. Additional mass within large parastomal hernia concerning for metastasis. Hydro has improved since stent placement. Numerous adenopathy.  Await further Oncology, Gyn, and likely general surgical input for treatment plans. If tissue diagnosis still desired prior to any intervention/treatment, US guided inguinal LN biopsy can be offered and done Monday 8/25. Please contact IR/Dr. Deanne Coffer with further questions.  Brayton El PA-C Interventional Radiology 01/13/2013 9:23 AM

## 2013-01-13 NOTE — Progress Notes (Signed)
ANTIBIOTIC CONSULT NOTE - FOLLOW UP  Pharmacy Consult for vancomycin Indication: enterococcal UTI  Allergies  Allergen Reactions  . Penicillins     REACTION: RASH    Patient Measurements: Height: 5\' 1"  (154.9 cm) Weight: 136 lb 8 oz (61.916 kg) IBW/kg (Calculated) : 47.8   Vital Signs: Temp: 98.1 F (36.7 C) (08/23 0603) Temp src: Oral (08/23 0603) BP: 145/70 mmHg (08/23 0603) Pulse Rate: 81 (08/23 0603) Intake/Output from previous day: 08/22 0701 - 08/23 0700 In: 700 [I.V.:700] Out: 1 [Urine:1]    Labs:  Recent Labs  01/11/13 0430 01/12/13 1110 01/13/13 0812  WBC 10.7* 12.0* 15.3*  HGB 9.4* 10.5* 9.8*  PLT 350 464* 437*  CREATININE 0.54 0.55 0.65   Estimated Creatinine Clearance: 65.4 ml/min (by C-G formula based on Cr of 0.65).   Microbiology: 8/18 urine: 80K enterococcus sp (R=tetracycline, S=ampicillin, levofloxacin, nitrofurantoin, vancomycin) 8/20 urine: 30K multiple bacterial morphotypes, none predominant  Anti-infectives: 8/20 >> vancomycin >>  Assessment: 40 yoF admitted 8/20 with ureteral obstruction and hydronephrosis. Urine culture from 8/18 with enterococcus. Hx of Crohn's disease s/p colectomy with ileostomy.  Patient s/p right ureter stent placement 8/22  Tm/24h = afebrile  WBC elevated on admit to 15.2, down to 10.7 on 8/21, up to 15.3 8/23  SCr 0.65 on 8/236, stable, CrCl ~65 mL/min  Will aim for higher trough due to high vancomycin MIC (=2)   Goal of Therapy:  Vancomycin trough level 15-20 mcg/ml  Plan:  - continue vancomycin 750mg  IV q12h - will check a vanc trough tonight prior to next dose, BUT recommend changing to Levaquin as per urine sensitivities - f/u antibiotic de-escalation and length of therapy - f/u after stent placement for urology recommendations   Hessie Knows, PharmD, BCPS Pager [phone] 01/13/2013 9:06 AM

## 2013-01-14 LAB — BASIC METABOLIC PANEL
Calcium: 8.8 mg/dL (ref 8.4–10.5)
GFR calc Af Amer: >90 mL/min (ref >90)
GFR calc non Af Amer: >90 mL/min (ref >90)
Potassium: 3.5 mEq/L (ref 3.5–5.1)
Sodium: 138 mEq/L (ref 135–145)

## 2013-01-14 LAB — CBC
MCHC: 32.4 g/dL (ref 30.0–36.0)
Platelets: 473 10*3/uL — ABNORMAL HIGH (ref 150–400)
RDW: 12.7 % (ref 11.5–15.5)

## 2013-01-14 MED ORDER — POTASSIUM CHLORIDE CRYS ER 20 MEQ PO TBCR
20.0000 meq | EXTENDED_RELEASE_TABLET | Freq: Once | ORAL | Status: AC
  Administered 2013-01-14: 20 meq via ORAL
  Filled 2013-01-14: qty 1

## 2013-01-14 NOTE — Consult Note (Signed)
HPI: Kirsten Schroeder is an 57 y.o. female found to have a large complex mass in pelvis. She had associated hydronephrosis on the right and underwent successful JJ stent placement. IR was asked to obtain tissue biopsy of mass. CT with contrast was performed to further define the mass. Very complex with loop of SB involved with fistula. Imaging reviewed and Dr. Deanne Coffer feels US guided LN biopsy be performed to obtain tissue sample for pathology. Chart, PMHx, meds reviewed. 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  Past Medical History:  Past Medical History  Diagnosis Date  . Hypertension   . Colitis     [place]  . GI bleed     Past Surgical History:  Past Surgical History  Procedure Laterality Date  . Ileostomy  12/27/1988    Removal of Large Intestine---Tim Earlene Plater, Careers adviser  . Cholecystectomy  1991-1992    Family History:  Family History  Problem Relation Age of Onset  . Hypertension Mother   . Arthritis Mother   . Hypertension Father   . Diabetes Father   . Hypertension Brother   . Diabetes Brother     Social History:  reports that she has never smoked. She has never used smokeless tobacco. She reports that she does not drink alcohol or use illicit drugs.  Allergies:  Allergies  Allergen Reactions  . Penicillins     REACTION: RASH    Medications:   Medication List    ASK your doctor about these medications       CALCIUM + D PO  Take 1 tablet by mouth daily.     FLAXSEED OIL PO  Take 400 mg by mouth daily.     lisinopril-hydrochlorothiazide 10-12.5 MG per tablet  Commonly known as:  ZESTORETIC  Take 1 tablet by mouth daily.     multivitamin tablet  Take 1 tablet by mouth daily.     ondansetron 4 MG disintegrating tablet  Commonly known as:  ZOFRAN ODT  Take 1 tablet (4 mg total) by mouth every 8 (eight) hours as needed for nausea.        Please HPI for pertinent positives, otherwise complete 10 system ROS  negative.  Physical Exam: BP 124/57  Pulse 87  Temp(Src) 98.2 F (36.8 C) (Oral)  Resp 18  Ht 5\' 1"  (1.549 m)  Wt 136 lb 8 oz (61.916 kg)  BMI 25.8 kg/m2  SpO2 98% Body mass index is 25.8 kg/(m^2).   General Appearance:  Alert, cooperative, no distress, appears stated age  Head:  Normocephalic, without obvious abnormality, atraumatic  ENT: Unremarkable  Neck: Supple, symmetrical, trachea midline  Lungs:   Clear to auscultation bilaterally, no w/r/r,  Chest Wall:  No tenderness or deformity  Heart:  Regular rate and rhythm, S1, S2 normal, no murmur, rub or gallop.  Abdomen:   Large parastomal hernia, ostomy viable.  Neurologic: Normal affect, no gross deficits.   Results for orders placed during the hospital encounter of 01/10/13 (from the past 48 hour(s))  CBC     Status: Abnormal   Collection Time    01/12/13 11:10 AM      Result Value Range   WBC 12.0 (*) 4.0 - 10.5 K/uL   RBC 3.57 (*) 3.87 - 5.11 MIL/uL   Hemoglobin 10.5 (*) 12.0 - 15.0 g/dL   HCT 16.1 (*) 09.6 - 04.5 %   MCV 89.6  78.0 - 100.0 fL   MCH 29.4  26.0 - 34.0 pg  MCHC 32.8  30.0 - 36.0 g/dL   RDW 16.1  09.6 - 04.5 %   Platelets 464 (*) 150 - 400 K/uL   Comment: REPEATED TO VERIFY     DELTA CHECK NOTED  BASIC METABOLIC PANEL     Status: Abnormal   Collection Time    01/12/13 11:10 AM      Result Value Range   Sodium 135  135 - 145 mEq/L   Potassium 3.4 (*) 3.5 - 5.1 mEq/L   Chloride 102  96 - 112 mEq/L   CO2 25  19 - 32 mEq/L   Glucose, Bld 97  70 - 99 mg/dL   BUN 6  6 - 23 mg/dL   Creatinine, Ser 4.09  0.50 - 1.10 mg/dL   Calcium 9.1  8.4 - 81.1 mg/dL   GFR calc non Af Amer >90  >90 mL/min   GFR calc Af Amer >90  >90 mL/min   Comment: (NOTE)     The eGFR has been calculated using the CKD EPI equation.     This calculation has not been validated in all clinical situations.     eGFR's persistently <90 mL/min signify possible Chronic Kidney     Disease.  BASIC METABOLIC PANEL     Status:  Abnormal   Collection Time    01/13/13  8:12 AM      Result Value Range   Sodium 134 (*) 135 - 145 mEq/L   Potassium 3.3 (*) 3.5 - 5.1 mEq/L   Chloride 102  96 - 112 mEq/L   CO2 24  19 - 32 mEq/L   Glucose, Bld 133 (*) 70 - 99 mg/dL   BUN 8  6 - 23 mg/dL   Creatinine, Ser 9.14  0.50 - 1.10 mg/dL   Calcium 8.7  8.4 - 78.2 mg/dL   GFR calc non Af Amer >90  >90 mL/min   GFR calc Af Amer >90  >90 mL/min   Comment: (NOTE)     The eGFR has been calculated using the CKD EPI equation.     This calculation has not been validated in all clinical situations.     eGFR's persistently <90 mL/min signify possible Chronic Kidney     Disease.  CBC     Status: Abnormal   Collection Time    01/13/13  8:12 AM      Result Value Range   WBC 15.3 (*) 4.0 - 10.5 K/uL   Comment: WHITE COUNT CONFIRMED ON SMEAR   RBC 3.27 (*) 3.87 - 5.11 MIL/uL   Hemoglobin 9.8 (*) 12.0 - 15.0 g/dL   HCT 95.6 (*) 21.3 - 08.6 %   MCV 88.7  78.0 - 100.0 fL   MCH 30.0  26.0 - 34.0 pg   MCHC 33.8  30.0 - 36.0 g/dL   RDW 57.8  46.9 - 62.9 %   Platelets 437 (*) 150 - 400 K/uL  CBC     Status: Abnormal   Collection Time    01/14/13  5:13 AM      Result Value Range   WBC 12.1 (*) 4.0 - 10.5 K/uL   RBC 3.29 (*) 3.87 - 5.11 MIL/uL   Hemoglobin 9.6 (*) 12.0 - 15.0 g/dL   HCT 52.8 (*) 41.3 - 24.4 %   MCV 90.0  78.0 - 100.0 fL   MCH 29.2  26.0 - 34.0 pg   MCHC 32.4  30.0 - 36.0 g/dL   RDW 01.0  27.2 - 53.6 %  Platelets 473 (*) 150 - 400 K/uL  BASIC METABOLIC PANEL     Status: Abnormal   Collection Time    01/14/13  5:13 AM      Result Value Range   Sodium 138  135 - 145 mEq/L   Potassium 3.5  3.5 - 5.1 mEq/L   Chloride 106  96 - 112 mEq/L   CO2 24  19 - 32 mEq/L   Glucose, Bld 106 (*) 70 - 99 mg/dL   BUN 5 (*) 6 - 23 mg/dL   Creatinine, Ser 2.95  0.50 - 1.10 mg/dL   Calcium 8.8  8.4 - 62.1 mg/dL   GFR calc non Af Amer >90  >90 mL/min   GFR calc Af Amer >90  >90 mL/min   Comment: (NOTE)     The eGFR has been  calculated using the CKD EPI equation.     This calculation has not been validated in all clinical situations.     eGFR's persistently <90 mL/min signify possible Chronic Kidney     Disease.   Ct Abdomen Pelvis W Contrast  01/12/2013   CLINICAL DATA:  Followup pelvic mass is and right hydronephrosis.  EXAM: CT ABDOMEN AND PELVIS WITH CONTRAST  TECHNIQUE: Multidetector CT imaging of the abdomen and pelvis was performed using the standard protocol following bolus administration of intravenous contrast.  CONTRAST:  OMNIPAQUE IOHEXOL 300 MG/ML  SOLN  COMPARISON:  Noncontrast CT dated 01/10/2013.  FINDINGS: Interval right ureteral stent with associated air in the right renal collecting system. Mild improvement in dilatation of the collecting system with moderate dilatation remaining.  A large, heterogeneous, multiloculated pelvic mass is again demonstrated. There are adjacent surgical clips posteriorly. The mass measures 12.8 x 10.5 cm on image number 69 with an adjacent right external iliac lymph node with a short axis diameter of 1.9 cm. The mass measures 12.3 cm in length on sagittal image number 87. The uterus and ovaries are not identified separate from the mass. There is gas and contrast in the anterior aspect of the mass, superiorly, with a small bowel loop, containing contrast, extending into that area. There is a small fistula between the small bowel and the mass seen on sagittal image number 93.  There are multiple additional enlarged lymph nodes. These include a 1.4 cm short axis obturator lymph node on the right on image number 69, on 1.7 cm short axis right inguinal lymph node on image number 75, 1.6 cm short axis left inguinal lymph node on image number 75, 1.4 cm right pelvic mesenteric lymph node on image number 56, multiple additional inferior mesenteric enlarged lymph nodes, multiple enlarged retroperitoneal lymph nodes, including a 1.1 cm short axis left para-aortic node on image number 35  and 1.4 cm short axis aortocaval lymph node on image number 34.  A large right parastomal hernia is again demonstrated containing herniated small bowel without obstruction. There is also an additional mass within the herniated mesentery, measuring 3.2 x 2.7 cm on image number 62.  There is a small ventral hernia to the left of a midline surgical scar at the level of the mid to upper pelvis, containing herniated small bowel without obstruction. There is also small amount of fluid within this hernia.  Mild diffuse low density of the liver relative to the spleen. The spleen, pancreas, adrenal glands, left kidney and urinary bladder are unremarkable. Cholecystectomy clips.  A small amount of atelectasis and scarring at the lung bases is unchanged. Lumbar lower thoracic  spine degenerative changes are noted.  IMPRESSION: 1. 12.8 x 12.3 x 10.5 cm heterogeneous pelvic mass, as described above. Differential considerations include uterine sarcoma, ovarian carcinoma and intestinal malignancy.  2. Small bowel fistula into the anterior, superior aspect of the mass, as described above.  3.  Metastatic pelvic, mesenteric and retroperitoneal adenopathy.  4. 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  5. Mildly improved right hydronephrosis following ureteral stent placement.  6. Small left ventral hernia containing hernia small bowel without obstruction.  7. Mild hepatic steatosis.   Electronically Signed   By: Gordan Payment   On: 01/12/2013 19:42    Assessment/Plan Complex pelvic mass with SB fistula. Secondary lesions concerning for mets. Pelvic and inguinal adenopathy concerning for mets. Discussed US guided inguinal LN biopsy as a first choice for tissue sample. Explained procedure, risks, complications, use of sedation. Holding heparin tomorrow. NPO p MN Consent signed in chart  Brayton El PA-C 01/14/2013, 10:26 AM

## 2013-01-14 NOTE — Progress Notes (Signed)
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  -Surgery recommend biopsy.  -Continue with antibiotics due to fistula. Flagyl and Levaquin day 4.  -oncologist informed of CT scan results.   2-Sepsis due to enterococcus UTI: UA with 3 to 6 WBC. Follow repeat urine culture with only 30,000 colonies. Urine culture from 8-18 grew enterococcus. Received vancomycin for 2 days. Patient presents with leukocytosis, fever, mild hypotension. Improving.  Day 4 of antibiotics.  change vancomycin to Levaquin, enterococcus was sensitive to Levaquin.   3-Hyponatremia: Resolved with IV fluids.   4-Hydronephrosis, right: obstructing mass to right ureter. Patient S/P stent placement by Dr Retta Diones on 8-22. Renal function stable.   5-Leukocytosis: could be secondary to UTI, but also due to small bowel fistula into the mass. WBC trending down.  6-Hypokalemia: resolved.  7-Anemia: monitor hb.    Code Status: Full Code.  Family Communication: Care discussed with patient.  Disposition Plan: to be determine. Will discussed disposition with Dr Truett Perna and surgery.    Consultants:  Dr Truett Perna.   Dr Retta Diones  Procedures:  none  Antibiotics:  Vancomycin.   HPI/Subjective: Feeling well, no complaints.   Objective: Filed Vitals:   01/14/13 0500  BP: 124/57  Pulse: 87  Temp: 98.2 F (36.8 C)  Resp: 18    Intake/Output Summary (Last 24 hours) at 01/14/13 1112 Last data filed at 01/14/13 0851  Gross per 24 hour  Intake 1741.25 ml  Output      0 ml  Net 1741.25 ml    Filed Weights   01/10/13 2050  Weight: 61.916 kg (136 lb 8 oz)    Exam:   General:  No distress.   Cardiovascular: S 1, S 2 RRR  Respiratory: CTA  Abdomen: BS present, ileostomy in place,   Musculoskeletal: no edema.   Data Reviewed: Basic Metabolic Panel:  Recent Labs Lab 01/10/13 1708 01/11/13 0430 01/12/13 1110 01/13/13 0812 01/14/13 0513  NA 128* 132* 135 134* 138  K 3.1* 3.9 3.4* 3.3* 3.5  CL 89* 97 102 102 106  CO2 26 26 25 24 24   GLUCOSE 115* 101* 97 133* 106*  BUN 14 11 6 8  5*  CREATININE 0.74 0.54 0.55 0.65 0.64  CALCIUM 9.4 9.0 9.1 8.7 8.8   Liver Function Tests:  Recent Labs Lab 01/10/13 1135  AST 18  ALT 13  ALKPHOS 66  BILITOT 0.9  PROT 7.8  ALBUMIN 3.2*   No results found for this basename: LIPASE, AMYLASE,  in the last 168 hours No results found for this basename: AMMONIA,  in the last 168 hours CBC:  Recent Labs Lab 01/10/13 1135 01/11/13 0430 01/12/13 1110 01/13/13 0812 01/14/13 0513  WBC 15.2* 10.7* 12.0* 15.3* 12.1*  NEUTROABS 12.5*  --   --   --   --   HGB 11.1* 9.4* 10.5* 9.8* 9.6*  HCT 32.5* 28.6* 32.0* 29.0* 29.6*  MCV 87.7 88.0 89.6 88.7 90.0  PLT 453.0* 350 464* 437* 473*   Cardiac Enzymes: No results found for this basename: CKTOTAL, CKMB, CKMBINDEX, TROPONINI,  in the last 168 hours BNP (last 3 results) No results found for this basename: PROBNP,  in the last 8760 hours CBG: No results found for this basename: GLUCAP,  in the last 168 hours  Recent Results (from the past 240 hour(s))  URINE CULTURE     Status: None   Collection Time    01/08/13  4:37 PM      Result Value Range Status   Culture ENTEROCOCCUS SPECIES   Final   Colony Count 80,000 COLONIES/ML   Final   Organism ID, Bacteria ENTEROCOCCUS SPECIES   Final  URINE CULTURE     Status: None   Collection Time    01/10/13  6:46 PM      Result Value Range Status   Specimen Description URINE, CLEAN CATCH   Final   Special Requests NONE   Final    Culture  Setup Time     Final   Value: 01/11/2013 01:14     Performed at [facility] Count     Final   Value: 30,000 COLONIES/ML     Performed at [facility]   Culture     Final   Value: Multiple bacterial morphotypes present, none predominant. Suggest appropriate recollection if clinically indicated.     Performed at [facility]   Report Status 01/11/2013 FINAL   Final  SURGICAL PCR SCREEN     Status: None   Collection Time    01/12/13  7:37 AM      Result Value Range Status   MRSA, PCR NEGATIVE  NEGATIVE Final   Staphylococcus aureus NEGATIVE  NEGATIVE Final   Comment:            The Xpert SA Assay (FDA     approved for NASAL specimens     in patients over 61 years of age),     is one component of     a comprehensive surveillance     program.  Test performance has     been validated by [employer] for patients greater     than or equal to 36 year old.     It is not intended     to diagnose infection nor to     guide or monitor treatment.     Studies: Ct Abdomen Pelvis W Contrast  01/12/2013   CLINICAL DATA:  Followup pelvic mass is and right hydronephrosis.  EXAM: CT ABDOMEN AND PELVIS WITH CONTRAST  TECHNIQUE: Multidetector CT imaging of the abdomen and pelvis was performed using the standard protocol following bolus administration of intravenous contrast.  CONTRAST:  OMNIPAQUE IOHEXOL 300 MG/ML  SOLN  COMPARISON:  Noncontrast CT dated 01/10/2013.  FINDINGS: Interval right ureteral stent with associated air in the right renal collecting system. Mild improvement in dilatation of the collecting system with moderate dilatation remaining.  A large, heterogeneous, multiloculated pelvic mass is again demonstrated. There are adjacent surgical clips posteriorly. The mass measures 12.8 x 10.5 cm on image number 69 with an adjacent right external iliac lymph node with a short axis diameter of 1.9 cm. The mass measures 12.3 cm in length on  sagittal image number 87. The uterus and ovaries are not identified separate from the mass. There is gas and contrast in the anterior aspect of the mass, superiorly, with a small bowel loop, containing contrast, extending into that area. There is a small fistula between the small bowel and the mass seen on sagittal image number 93.  There are multiple additional enlarged lymph nodes. These include a 1.4 cm  short axis obturator lymph node on the right on image number 69, on 1.7 cm short axis right inguinal lymph node on image number 75, 1.6 cm short axis left inguinal lymph node on image number 75, 1.4 cm right pelvic mesenteric lymph node on image number 56, multiple additional inferior mesenteric enlarged lymph nodes, multiple enlarged retroperitoneal lymph nodes, including a 1.1 cm short axis left para-aortic node on image number 35 and 1.4 cm short axis aortocaval lymph node on image number 34.  A large right parastomal hernia is again demonstrated containing herniated small bowel without obstruction. There is also an additional mass within the herniated mesentery, measuring 3.2 x 2.7 cm on image number 62.  There is a small ventral hernia to the left of a midline surgical scar at the level of the mid to upper pelvis, containing herniated small bowel without obstruction. There is also small amount of fluid within this hernia.  Mild diffuse low density of the liver relative to the spleen. The spleen, pancreas, adrenal glands, left kidney and urinary bladder are unremarkable. Cholecystectomy clips.  A small amount of atelectasis and scarring at the lung bases is unchanged. Lumbar lower thoracic spine degenerative changes are noted.  IMPRESSION: 1. 12.8 x 12.3 x 10.5 cm heterogeneous pelvic mass, as described above. Differential considerations include uterine sarcoma, ovarian carcinoma and intestinal malignancy.  2. Small bowel fistula into the anterior, superior aspect of the mass, as described above.  3.   Metastatic pelvic, mesenteric and retroperitoneal adenopathy.  4. 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  5. Mildly improved right hydronephrosis following ureteral stent placement.  6. Small left ventral hernia containing hernia small bowel without obstruction.  7. Mild hepatic steatosis.   Electronically Signed   By: Gordan Payment   On: 01/12/2013 19:42    Scheduled Meds: . heparin  5,000 Units Subcutaneous Q8H  . levofloxacin (LEVAQUIN) IV  750 mg Intravenous Q24H  . metronidazole  500 mg Intravenous Q8H  . sodium chloride  3 mL Intravenous Q12H   Continuous Infusions: . 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 If 7PM-7AM, please contact night-coverage at [email or web address], password [facility] 01/14/2013, 11:12 AM  LOS: 4 days

## 2013-01-14 NOTE — Progress Notes (Signed)
Kirsten Schroeder   DOB:Jul 06, 1955   XB#:[id number]   NUU#:[id number]  Subjective: The patient currently has no complaints. She had cystoscopy, right retrograde ureteropyelogram, double-J stent placement on right on 01/12/13. She also had CT abdomen and pelvis with contrast.   Review of Systems  Constitutional: Positive for weight loss and malaise/fatigue. Negative for fever, chills and diaphoresis.  HENT: Negative for nosebleeds, congestion, sore throat, neck pain, tinnitus and ear discharge.   Eyes: Negative for blurred vision, double vision and photophobia.  Respiratory: Negative for cough, hemoptysis, sputum production, shortness of breath and wheezing.   Cardiovascular: Negative for chest pain, palpitations, orthopnea and claudication.  Gastrointestinal: Positive for nausea and vomiting. Negative for diarrhea, constipation and blood in stool.  Genitourinary: Positive for dysuria. Negative for urgency, frequency and hematuria.  Musculoskeletal: Negative for myalgias, back pain and joint pain.  Skin: Negative for itching and rash.  Neurological: Positive for tingling and weakness. Negative for dizziness, speech change, seizures and headaches.  Psychiatric/Behavioral: Negative.    Objective:  Filed Vitals:   01/13/13 0603  BP: 145/70  Pulse: 81  Temp: 98.1 F (36.7 C)  Resp: 16    Body mass index is 25.8 kg/(m^2).  Intake/Output Summary (Last 24 hours) at 01/14/13 1328 Last data filed at 01/14/13 0851  Gross per 24 hour  Intake 1741.25 ml  Output      0 ml  Net 1741.25 ml     Sclerae anicteric  Oropharynx clear  No peripheral adenopathy  Lungs clear -- no rales or rhonchi  Heart regular rate and rhythm  Abdomen benign  MKS no focal spinal tenderness, no peripheral edema  Neuro non focal  Labs:   Basic Metabolic Panel:  Recent Labs Lab 01/10/13 1708 01/11/13 0430 01/12/13 1110 01/13/13 0812 01/14/13 0513  NA 128* 132* 135 134* 138  K 3.1* 3.9 3.4* 3.3* 3.5  CL 89* 97  102 102 106  CO2 26 26 25 24 24   GLUCOSE 115* 101* 97 133* 106*  BUN 14 11 6 8  5*  CREATININE 0.74 0.54 0.55 0.65 0.64  CALCIUM 9.4 9.0 9.1 8.7 8.8   GFR Estimated Creatinine Clearance: 65.4 ml/min (by C-G formula based on Cr of 0.64). Liver Function Tests:  Recent Labs Lab 01/10/13 1135  AST 18  ALT 13  ALKPHOS 66  BILITOT 0.9  PROT 7.8  ALBUMIN 3.2*   Coagulation profile  Recent Labs Lab 01/10/13 1755  INR 1.17    CBC:  Recent Labs Lab 01/10/13 1135 01/11/13 0430 01/12/13 1110 01/13/13 0812 01/14/13 0513  WBC 15.2* 10.7* 12.0* 15.3* 12.1*  NEUTROABS 12.5*  --   --   --   --   HGB 11.1* 9.4* 10.5* 9.8* 9.6*  HCT 32.5* 28.6* 32.0* 29.0* 29.6*  MCV 87.7 88.0 89.6 88.7 90.0  PLT 453.0* 350 464* 437* 473*   Anemia work up  Microbiology Recent Results (from the past 240 hour(s))  URINE CULTURE     Status: None   Collection Time    01/08/13  4:37 PM      Result Value Range Status   Culture ENTEROCOCCUS SPECIES   Final   Colony Count 80,000 COLONIES/ML   Final   Organism ID, Bacteria ENTEROCOCCUS SPECIES   Final  URINE CULTURE     Status: None   Collection Time    01/10/13  6:46 PM      Result Value Range Status   Specimen Description URINE, CLEAN CATCH   Final   Special  Requests NONE   Final   Culture  Setup Time     Final   Value: 01/11/2013 01:14     Performed at [facility] Count     Final   Value: 30,000 COLONIES/ML     Performed at [facility]   Culture     Final   Value: Multiple bacterial morphotypes present, none predominant. Suggest appropriate recollection if clinically indicated.     Performed at [facility]   Report Status 01/11/2013 FINAL   Final  SURGICAL PCR SCREEN     Status: None   Collection Time    01/12/13  7:37 AM      Result Value Range Status   MRSA, PCR NEGATIVE  NEGATIVE Final   Staphylococcus aureus NEGATIVE  NEGATIVE Final   Comment:            The Xpert SA Assay (FDA     approved  for NASAL specimens     in patients over 44 years of age),     is one component of     a comprehensive surveillance     program.  Test performance has     been validated by [employer] for patients greater     than or equal to 60 year old.     It is not intended     to diagnose infection nor to     guide or monitor treatment.      Studies:  Ct Abdomen Pelvis W Contrast  01/12/2013   CLINICAL DATA:  Followup pelvic mass is and right hydronephrosis.  EXAM: CT ABDOMEN AND PELVIS WITH CONTRAST  TECHNIQUE: Multidetector CT imaging of the abdomen and pelvis was performed using the standard protocol following bolus administration of intravenous contrast.  CONTRAST:  OMNIPAQUE IOHEXOL 300 MG/ML  SOLN  COMPARISON:  Noncontrast CT dated 01/10/2013.  FINDINGS: Interval right ureteral stent with associated air in the right renal collecting system. Mild improvement in dilatation of the collecting system with moderate dilatation remaining.  A large, heterogeneous, multiloculated pelvic mass is again demonstrated. There are adjacent surgical clips posteriorly. The mass measures 12.8 x 10.5 cm on image number 69 with an adjacent right external iliac lymph node with a short axis diameter of 1.9 cm. The mass measures 12.3 cm in length on sagittal image number 87. The uterus and ovaries are not identified separate from the mass. There is gas and contrast in the anterior aspect of the mass, superiorly, with a small bowel loop, containing contrast, extending into that area. There is a small fistula between the small bowel and the mass seen on sagittal image number 93.  There are multiple additional enlarged lymph nodes. These include a 1.4 cm short axis obturator lymph node on the right on image number 69, on 1.7 cm short axis right inguinal lymph node on image number 75, 1.6 cm short axis left inguinal lymph node on image number 75, 1.4 cm right pelvic mesenteric lymph node on image number 56, multiple  additional inferior mesenteric enlarged lymph nodes, multiple enlarged retroperitoneal lymph nodes, including a 1.1 cm short axis left para-aortic node on image number 35 and 1.4 cm short axis aortocaval lymph node on image number 34.  A large right parastomal hernia is again demonstrated containing herniated small bowel without obstruction. There is also an additional mass within the herniated mesentery, measuring 3.2 x 2.7 cm on image number 62.  There is a  small ventral hernia to the left of a midline surgical scar at the level of the mid to upper pelvis, containing herniated small bowel without obstruction. There is also small amount of fluid within this hernia.  Mild diffuse low density of the liver relative to the spleen. The spleen, pancreas, adrenal glands, left kidney and urinary bladder are unremarkable. Cholecystectomy clips.  A small amount of atelectasis and scarring at the lung bases is unchanged. Lumbar lower thoracic spine degenerative changes are noted.  IMPRESSION: 1. 12.8 x 12.3 x 10.5 cm heterogeneous pelvic mass, as described above. Differential considerations include uterine sarcoma, ovarian carcinoma and intestinal malignancy.  2. Small bowel fistula into the anterior, superior aspect of the mass, as described above.  3.  Metastatic pelvic, mesenteric and retroperitoneal adenopathy.  4. 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  5. Mildly improved right hydronephrosis following ureteral stent placement.  6. Small left ventral hernia containing hernia small bowel without obstruction.  7. Mild hepatic steatosis.   Electronically Signed   By: Gordan Payment   On: 01/12/2013 19:42   Assessment/Plan:  1. SB fistula was found on CT. The patient still require biopsy for further treatment plan. 2. Anemia. I would recommend usual work up for anemia if it was not done recently. 3. Leukocytosis and thrombocytosis.  Reactive.  I was not able to finish my notes yesterday due to problem to log in Epic. Myra Rude, MD 01/13/2013  11:48 PM

## 2013-01-14 NOTE — Progress Notes (Signed)
Report received from J. McLaughlin. No change since initial pm assessment. Will continue to follow the plan of care.  

## 2013-01-15 ENCOUNTER — Inpatient Hospital Stay (HOSPITAL_COMMUNITY): Payer: BC Managed Care – PPO

## 2013-01-15 ENCOUNTER — Encounter (HOSPITAL_COMMUNITY): Payer: Self-pay | Admitting: Urology

## 2013-01-15 LAB — BASIC METABOLIC PANEL
CO2: 24 mEq/L (ref 19–32)
GFR calc non Af Amer: >90 mL/min (ref >90)
Glucose, Bld: 107 mg/dL — ABNORMAL HIGH (ref 70–99)
Potassium: 3.1 mEq/L — ABNORMAL LOW (ref 3.5–5.1)
Sodium: 135 mEq/L (ref 135–145)

## 2013-01-15 LAB — CBC
Hemoglobin: 9.4 g/dL — ABNORMAL LOW (ref 12.0–15.0)
Platelets: 444 10*3/uL — ABNORMAL HIGH (ref 150–400)
RBC: 3.25 MIL/uL — ABNORMAL LOW (ref 3.87–5.11)

## 2013-01-15 MED ORDER — FENTANYL CITRATE 0.05 MG/ML IJ SOLN
INTRAMUSCULAR | Status: AC | PRN
  Administered 2013-01-15: 100 ug via INTRAVENOUS

## 2013-01-15 MED ORDER — MIDAZOLAM HCL 2 MG/2ML IJ SOLN
INTRAMUSCULAR | Status: AC | PRN
  Administered 2013-01-15 (×2): 1 mg via INTRAVENOUS

## 2013-01-15 MED ORDER — MORPHINE SULFATE 2 MG/ML IJ SOLN: 1.0000 mg | INTRAMUSCULAR | Status: DC | PRN

## 2013-01-15 MED ORDER — MIDAZOLAM HCL 2 MG/2ML IJ SOLN
INTRAMUSCULAR | Status: AC
  Filled 2013-01-15: qty 4

## 2013-01-15 MED ORDER — POTASSIUM CHLORIDE CRYS ER 20 MEQ PO TBCR
40.0000 meq | EXTENDED_RELEASE_TABLET | Freq: Two times a day (BID) | ORAL | Status: AC
  Administered 2013-01-15 (×2): 40 meq via ORAL
  Filled 2013-01-15 (×2): qty 2

## 2013-01-15 MED ORDER — HYDROCODONE-ACETAMINOPHEN 5-325 MG PO TABS
1.0000 | ORAL_TABLET | Freq: Four times a day (QID) | ORAL | Status: DC | PRN
  Administered 2013-01-15 – 2013-01-16 (×2): 1 via ORAL
  Filled 2013-01-15 (×3): qty 1

## 2013-01-15 MED ORDER — FENTANYL CITRATE 0.05 MG/ML IJ SOLN
INTRAMUSCULAR | Status: AC
  Filled 2013-01-15: qty 4

## 2013-01-15 NOTE — Progress Notes (Signed)
[facility] PROGRESS NOTE  Kirsten Schroeder AVW:[id number] DOB: 1955-08-13 DOA: 01/10/2013 PCP: Loreen Freud, DO  Assessment/Plan:  1-Retroperitoneal and pelvic pathologic adenopathy, mass inseparable from the uterus with cystic and solid elements:  CT abdomen and pelvis show pelvic mass, small bowel fistula into the anterior superior aspect of the mass. 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  -Surgery recommend biopsy.  -Continue with antibiotics due to fistula. Flagyl day 3 and Levaquin day 4.  -Lymph node biopsy 8-25. Await pathology result to determine disposition.  -GYN oncology, Hematology, surgery following.   2-Sepsis due to enterococcus UTI: UA with 3 to 6 WBC. Follow repeat urine culture with only 30,000 colonies. Urine culture from 8-18 grew enterococcus. Received vancomycin for 2 days. Day 5 of antibiotics. Continue with Levaquin. ]  3-Hyponatremia: Resolved with IV fluids.   4-Hydronephrosis, right: obstructing mass to right ureter. Patient S/P stent placement by Dr Retta Diones on 8-22. Renal function stable.   5-Leukocytosis: could be secondary to UTI, but also due to small bowel fistula into the mass. WBC stable at 12.  6-Hypokalemia: replete with oral Kcl 40 meq time 2. Check Mg level.   7-Anemia: monitor hb.    Code Status: Full Code.  Family Communication: Care discussed with patient.  Disposition Plan: to be determine.    Consultants:  Dr Truett Perna.   Dr Retta Diones  Procedures:  none  Antibiotics:  Vancomycin.   HPI/Subjective: Feeling well, no complaints.   Objective: Filed Vitals:   01/15/13 0609  BP: 122/72  Pulse: 76  Temp: 98.7 F (37.1 C)  Resp: 18    Intake/Output Summary (Last 24 hours) at 01/15/13 0812 Last data filed at 01/15/13 8295  Gross per 24 hour  Intake   2585 ml  Output      0 ml  Net   2585 ml   Filed Weights   01/10/13 2050  Weight:  61.916 kg (136 lb 8 oz)    Exam:   General:  No distress.   Cardiovascular: S 1, S 2 RRR  Respiratory: CTA  Abdomen: BS present, ileostomy in place,   Musculoskeletal: no edema.   Data Reviewed: Basic Metabolic Panel:  Recent Labs Lab 01/11/13 0430 01/12/13 1110 01/13/13 0812 01/14/13 0513 01/15/13 0448  NA 132* 135 134* 138 135  K 3.9 3.4* 3.3* 3.5 3.1*  CL 97 102 102 106 104  CO2 26 25 24 24 24   GLUCOSE 101* 97 133* 106* 107*  BUN 11 6 8  5* 4*  CREATININE 0.54 0.55 0.65 0.64 0.57  CALCIUM 9.0 9.1 8.7 8.8 8.3*   Liver Function Tests:  Recent Labs Lab 01/10/13 1135  AST 18  ALT 13  ALKPHOS 66  BILITOT 0.9  PROT 7.8  ALBUMIN 3.2*   No results found for this basename: LIPASE, AMYLASE,  in the last 168 hours No results found for this basename: AMMONIA,  in the last 168 hours CBC:  Recent Labs Lab 01/10/13 1135 01/11/13 0430 01/12/13 1110 01/13/13 0812 01/14/13 0513 01/15/13 0448  WBC 15.2* 10.7* 12.0* 15.3* 12.1* 12.4*  NEUTROABS 12.5*  --   --   --   --   --   HGB 11.1* 9.4* 10.5* 9.8* 9.6* 9.4*  HCT 32.5* 28.6* 32.0* 29.0* 29.6* 28.9*  MCV 87.7 88.0 89.6 88.7 90.0 88.9  PLT 453.0* 350 464* 437* 473* 444*   Cardiac Enzymes: No results found for this basename: CKTOTAL, CKMB, CKMBINDEX, TROPONINI,  in the last  168 hours BNP (last 3 results) No results found for this basename: PROBNP,  in the last 8760 hours CBG: No results found for this basename: GLUCAP,  in the last 168 hours  Recent Results (from the past 240 hour(s))  URINE CULTURE     Status: None   Collection Time    01/08/13  4:37 PM      Result Value Range Status   Culture ENTEROCOCCUS SPECIES   Final   Colony Count 80,000 COLONIES/ML   Final   Organism ID, Bacteria ENTEROCOCCUS SPECIES   Final  URINE CULTURE     Status: None   Collection Time    01/10/13  6:46 PM      Result Value Range Status   Specimen Description URINE, CLEAN CATCH   Final   Special Requests NONE   Final    Culture  Setup Time     Final   Value: 01/11/2013 01:14     Performed at [facility] Count     Final   Value: 30,000 COLONIES/ML     Performed at [facility]   Culture     Final   Value: Multiple bacterial morphotypes present, none predominant. Suggest appropriate recollection if clinically indicated.     Performed at [facility]   Report Status 01/11/2013 FINAL   Final  SURGICAL PCR SCREEN     Status: None   Collection Time    01/12/13  7:37 AM      Result Value Range Status   MRSA, PCR NEGATIVE  NEGATIVE Final   Staphylococcus aureus NEGATIVE  NEGATIVE Final   Comment:            The Xpert SA Assay (FDA     approved for NASAL specimens     in patients over 65 years of age),     is one component of     a comprehensive surveillance     program.  Test performance has     been validated by [employer] for patients greater     than or equal to 4 year old.     It is not intended     to diagnose infection nor to     guide or monitor treatment.     Studies: No results found.  Scheduled Meds: . heparin  5,000 Units Subcutaneous Q8H  . levofloxacin (LEVAQUIN) IV  750 mg Intravenous Q24H  . metronidazole  500 mg Intravenous Q8H  . sodium chloride  3 mL Intravenous Q12H   Continuous Infusions: . 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 If 7PM-7AM, please contact night-coverage at [email or web address], password [facility] 01/15/2013, 8:12 AM  LOS: 5 days

## 2013-01-15 NOTE — Progress Notes (Signed)
Kirsten Schroeder is a 57 y.o. F with a pelvic mass and retroperitoneal adenopathy.  Will await biopsy results and continued workup.

## 2013-01-15 NOTE — Procedures (Signed)
Procedure:  Ultrasound guided core biopsy of left inguinal lymph node Findings:  18 G core biopsy x 4 of enlarged left inguinal lymph node.  Samples submitted to Path in saline.

## 2013-01-16 LAB — CBC
Hemoglobin: 10.5 g/dL — ABNORMAL LOW (ref 12.0–15.0)
MCH: 28.7 pg (ref 26.0–34.0)
MCV: 89.3 fL (ref 78.0–100.0)
Platelets: 449 10*3/uL — ABNORMAL HIGH (ref 150–400)
RBC: 3.66 MIL/uL — ABNORMAL LOW (ref 3.87–5.11)
WBC: 14.5 10*3/uL — ABNORMAL HIGH (ref 4.0–10.5)

## 2013-01-16 LAB — BASIC METABOLIC PANEL
CO2: 24 mEq/L (ref 19–32)
Calcium: 8.5 mg/dL (ref 8.4–10.5)
Chloride: 106 mEq/L (ref 96–112)
Glucose, Bld: 118 mg/dL — ABNORMAL HIGH (ref 70–99)
Sodium: 137 mEq/L (ref 135–145)

## 2013-01-16 LAB — SURGICAL PCR SCREEN: MRSA, PCR: NEGATIVE

## 2013-01-16 LAB — CA 125: CA 125: 5626.3 U/mL — ABNORMAL HIGH (ref 0.0–30.2)

## 2013-01-16 LAB — MAGNESIUM: Magnesium: 1.4 mg/dL — ABNORMAL LOW (ref 1.5–2.5)

## 2013-01-16 MED ORDER — MAGNESIUM SULFATE 40 MG/ML IJ SOLN
2.0000 g | Freq: Once | INTRAMUSCULAR | Status: AC
  Administered 2013-01-16: 2 g via INTRAVENOUS
  Filled 2013-01-16: qty 50

## 2013-01-16 NOTE — Progress Notes (Signed)
ATTENDING ADDENDUM:  I personally reviewed patient's record, examined the patient, and formulated the following assessment and plan:  Pt awaiting biopsy results.  Would like to go home and finish w/u as an outpatient.  Discussed with her that we should have results of biopsy today.

## 2013-01-16 NOTE — Progress Notes (Signed)
Patient ID: Kirsten Schroeder, female   DOB: Nov 23, 1955, 57 y.o.   MRN: [id number] 4 Days Post-Op  Subjective: Was having a lot of left groin pain but better since she modified the way she was getting up, denies n/v, +BMs, wants to go home  Objective: Vital signs in last 24 hours: Temp:  [98.4 F (36.9 C)-99 F (37.2 C)] 98.6 F (37 C) (08/26 0533) Pulse Rate:  [70-98] 70 (08/26 0533) Resp:  [10-23] 18 (08/26 0533) BP: (124-141)/(66-105) 124/70 mmHg (08/26 0533) SpO2:  [94 %-98 %] 96 % (08/26 0533) Last BM Date: 01/15/13  Intake/Output from previous day: 08/25 0701 - 08/26 0700 In: 1440 [P.O.:240; I.V.:1200] Out: -  Intake/Output this shift:    PE: Abd: soft, nontender, +BS, bandaid over left groin, no hematoma General: NAD  Lab Results:   Recent Labs  01/15/13 0448 01/16/13 0427  WBC 12.4* 14.5*  HGB 9.4* 10.5*  HCT 28.9* 32.7*  PLT 444* 449*   BMET  Recent Labs  01/15/13 0448 01/16/13 0427  NA 135 137  K 3.1* 3.8  CL 104 106  CO2 24 24  GLUCOSE 107* 118*  BUN 4* 3*  CREATININE 0.57 0.54  CALCIUM 8.3* 8.5   PT/INR No results found for this basename: LABPROT, INR,  in the last 72 hours CMP     Component Value Date/Time   NA 137 01/16/2013 0427   K 3.8 01/16/2013 0427   CL 106 01/16/2013 0427   CO2 24 01/16/2013 0427   GLUCOSE 118* 01/16/2013 0427   BUN 3* 01/16/2013 0427   CREATININE 0.54 01/16/2013 0427   CREATININE 0.77 01/05/2013 1616   CALCIUM 8.5 01/16/2013 0427   PROT 7.8 01/10/2013 1135   ALBUMIN 3.2* 01/10/2013 1135   AST 18 01/10/2013 1135   ALT 13 01/10/2013 1135   ALKPHOS 66 01/10/2013 1135   BILITOT 0.9 01/10/2013 1135   GFRNONAA >90 01/16/2013 0427   GFRAA >90 01/16/2013 0427   Lipase  No results found for this basename: lipase       Studies/Results: US Biopsy  01/15/2013   *RADIOLOGY REPORT*  Clinical Data: Large pelvic mass and enlarged bilateral inguinal lymph nodes.  The patient presents for lymph node biopsy.  ULTRASOUND GUIDED CORE  BIOPSY OF LEFT INGUINAL LYMPH NODE  Sedation:  2.0 mg IV Versed;  100 mcg IV Fentanyl  Total Moderate Sedation Time: 50 minutes.  Procedure:  The procedure, risks, benefits, and alternatives were explained to the patient.  Questions regarding the procedure were encouraged and answered.  The patient understands and consents to the procedure.  The left inguinal region was prepped with Betadine in a sterile fashion, and a sterile drape was applied covering the operative field.  A sterile gown and sterile gloves were used for the procedure. Local anesthesia was provided with 1% Lidocaine.  Ultrasound was performed of both inguinal regions.  The left was chosen for biopsy.  Under ultrasound guidance, 18 gauge core biopsy samples were obtained of the lymph node.  A total of four samples were submitted in saline.  Complications: None  Findings: Left inguinal lymph nodes are visibly larger by ultrasound.  Two adjacent lymph nodes measure in conglomerate dimensions approximately 1.7 x 3.7 x 2.0 cm.  Samples were obtained through this area.  Solid tissue was obtained.  IMPRESSION: Ultrasound guided core biopsy performed of an enlarged left inguinal lymph node.   Original Report Authenticated By: Irish Lack, M.D.    Anti-infectives: Anti-infectives   Start  Dose/Rate Route Frequency Ordered Stop   01/13/13 1200  levofloxacin (LEVAQUIN) IVPB 750 mg     750 mg 100 mL/hr over 90 Minutes Intravenous Every 24 hours 01/13/13 1017     01/13/13 1200  metroNIDAZOLE (FLAGYL) IVPB 500 mg     500 mg 100 mL/hr over 60 Minutes Intravenous Every 8 hours 01/13/13 1017     01/12/13 2000  ciprofloxacin (CIPRO) IVPB 400 mg     400 mg 200 mL/hr over 60 Minutes Intravenous Every 12 hours 01/12/13 1519 01/13/13 0936   01/10/13 2000  vancomycin (VANCOCIN) IVPB 750 mg/150 ml premix  Status:  Discontinued     750 mg 150 mL/hr over 60 Minutes Intravenous Every 12 hours 01/10/13 1957 01/13/13 1016        Assessment/Plan LARGE PELVIC MASS, Mesentery mass noted ? 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   LOS: 6 days    WHITE, ELIZABETH 01/16/2013

## 2013-01-16 NOTE — Progress Notes (Addendum)
[facility] PROGRESS NOTE  KHRISTEN CHEYNEY XBM:[id number] DOB: 26-Feb-1956 DOA: 01/10/2013 PCP: Loreen Freud, DO  Assessment/Plan: 57 year old admitted 8-20 with right side abdominal pain, nausea, vomiting, fever. Had UA done at PCP office that grew 80 K enterococcus. She had a CT abdomen and pelvis in the ED that show pelvic mass and right side hydronephrosis. She underwent stent placement by Dr. Retta Diones on 8-22. She had a repeat  CT scan abdomen and pelvis but with contrast that showed: pelvic mass,  Small bowel fistula into the anterior, superior aspect of the mass, metastatic pelvic, mesenteric and retroperitoneal adenopathy. 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 Small left ventral hernia containing hernia small bowel without obstruction. Mild hepatic steatosis. She underwent left inguinal lymph node biopsy 8-25. Pathology report is pending. 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  -GYN oncology, Hematology/oncology, surgery following.  -Will need to discussed with GYN/Oncology and surgery disposition after pathology report available, in setting of small bowel fistula to the mass.   1-Retroperitoneal and pelvic pathologic adenopathy, mass inseparable from the uterus with cystic and solid elements:  CT abdomen and pelvis show pelvic mass, small bowel fistula into the anterior superior aspect of the mass. 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  -Continue with antibiotics due to fistula. Flagyl day 4 and Levaquin day 5.  -Lymph node biopsy 8-25. Await pathology result to determine further care.  -GYN oncology, Hematology/oncology, surgery following.  -Will need to discussed with GYN/Oncology and surgery disposition after  pathology report available, in setting of small bowel fistula to the mass.   2-Sepsis due to enterococcus UTI: UA with 3 to 6 WBC. Follow repeat urine culture with only 30,000 colonies. Urine culture from 8-18 grew enterococcus. Received vancomycin for 2 days. Day 6 of antibiotics. Continue with Levaquin.   3-Hyponatremia: Resolved with IV fluids.   4-Hydronephrosis, right: obstructing mass to right ureter. Patient S/P stent placement by Dr Retta Diones on 8-22. Renal function stable.   5-Leukocytosis: could be secondary to UTI, but also due to small bowel fistula into the mass. WBC increase today at 14.  6-Hypokalemia: . Will replace Mg iv. Repeat K in am.  7-Anemia: monitor hb. Hb stable.    Code Status: Full Code.  Family Communication: Care discussed with patient.  Disposition Plan: to be determine.    Consultants:  Dr Truett Perna.   Dr Retta Diones  Procedures:  none  Antibiotics:  Vancomycin.   HPI/Subjective: Feeling well, no complaints.  Waiting for results.  roin pain has resolved.   Objective: Filed Vitals:   01/16/13 0533  BP: 124/70  Pulse: 70  Temp: 98.6 F (37 C)  Resp: 18    Intake/Output Summary (Last 24 hours) at 01/16/13 1152 Last data filed at 01/16/13 0600  Gross per 24 hour  Intake   1440 ml  Output      0 ml  Net   1440 ml   Filed Weights   01/10/13 2050  Weight: 61.916 kg (136 lb 8 oz)    Exam:   General:  No distress.   Cardiovascular: S 1, S 2 RRR  Respiratory: CTA  Abdomen: BS present, ileostomy in place,   Musculoskeletal: no edema.   Data Reviewed: Basic Metabolic Panel:  Recent Labs Lab 01/12/13 1110 01/13/13 0272 01/14/13 5366  01/15/13 0448 01/16/13 0427  NA 135 134* 138 135 137  K 3.4* 3.3* 3.5 3.1* 3.8  CL 102 102 106 104 106  CO2 25 24 24 24 24   GLUCOSE 97 133* 106* 107* 118*  BUN 6 8 5* 4* 3*  CREATININE 0.55 0.65 0.64 0.57 0.54  CALCIUM 9.1 8.7 8.8 8.3* 8.5  MG  --   --   --   --  1.4*   Liver  Function Tests:  Recent Labs Lab 01/10/13 1135  AST 18  ALT 13  ALKPHOS 66  BILITOT 0.9  PROT 7.8  ALBUMIN 3.2*   No results found for this basename: LIPASE, AMYLASE,  in the last 168 hours No results found for this basename: AMMONIA,  in the last 168 hours CBC:  Recent Labs Lab 01/10/13 1135  01/12/13 1110 01/13/13 0812 01/14/13 0513 01/15/13 0448 01/16/13 0427  WBC 15.2*  < > 12.0* 15.3* 12.1* 12.4* 14.5*  NEUTROABS 12.5*  --   --   --   --   --   --   HGB 11.1*  < > 10.5* 9.8* 9.6* 9.4* 10.5*  HCT 32.5*  < > 32.0* 29.0* 29.6* 28.9* 32.7*  MCV 87.7  < > 89.6 88.7 90.0 88.9 89.3  PLT 453.0*  < > 464* 437* 473* 444* 449*  < > = values in this interval not displayed. Cardiac Enzymes: No results found for this basename: CKTOTAL, CKMB, CKMBINDEX, TROPONINI,  in the last 168 hours BNP (last 3 results) No results found for this basename: PROBNP,  in the last 8760 hours CBG: No results found for this basename: GLUCAP,  in the last 168 hours  Recent Results (from the past 240 hour(s))  URINE CULTURE     Status: None   Collection Time    01/08/13  4:37 PM      Result Value Range Status   Culture ENTEROCOCCUS SPECIES   Final   Colony Count 80,000 COLONIES/ML   Final   Organism ID, Bacteria ENTEROCOCCUS SPECIES   Final  URINE CULTURE     Status: None   Collection Time    01/10/13  6:46 PM      Result Value Range Status   Specimen Description URINE, CLEAN CATCH   Final   Special Requests NONE   Final   Culture  Setup Time     Final   Value: 01/11/2013 01:14     Performed at [facility] Count     Final   Value: 30,000 COLONIES/ML     Performed at [facility]   Culture     Final   Value: Multiple bacterial morphotypes present, none predominant. Suggest appropriate recollection if clinically indicated.     Performed at [facility]   Report Status 01/11/2013 FINAL   Final  SURGICAL PCR SCREEN     Status: None   Collection Time     01/12/13  7:37 AM      Result Value Range Status   MRSA, PCR NEGATIVE  NEGATIVE Final   Staphylococcus aureus NEGATIVE  NEGATIVE Final   Comment:            The Xpert SA Assay (FDA     approved for NASAL specimens     in patients over 69 years of age),     is one component of     a comprehensive surveillance     program.  Test performance has  been validated by [facility] for patients greater     than or equal to 41 year old.     It is not intended     to diagnose infection nor to     guide or monitor treatment.     Studies: US Biopsy  01/15/2013   *RADIOLOGY REPORT*  Clinical Data: Large pelvic mass and enlarged bilateral inguinal lymph nodes.  The patient presents for lymph node biopsy.  ULTRASOUND GUIDED CORE BIOPSY OF LEFT INGUINAL LYMPH NODE  Sedation:  2.0 mg IV Versed;  100 mcg IV Fentanyl  Total Moderate Sedation Time: 50 minutes.  Procedure:  The procedure, risks, benefits, and alternatives were explained to the patient.  Questions regarding the procedure were encouraged and answered.  The patient understands and consents to the procedure.  The left inguinal region was prepped with Betadine in a sterile fashion, and a sterile drape was applied covering the operative field.  A sterile gown and sterile gloves were used for the procedure. Local anesthesia was provided with 1% Lidocaine.  Ultrasound was performed of both inguinal regions.  The left was chosen for biopsy.  Under ultrasound guidance, 18 gauge core biopsy samples were obtained of the lymph node.  A total of four samples were submitted in saline.  Complications: None  Findings: Left inguinal lymph nodes are visibly larger by ultrasound.  Two adjacent lymph nodes measure in conglomerate dimensions approximately 1.7 x 3.7 x 2.0 cm.  Samples were obtained through this area.  Solid tissue was obtained.  IMPRESSION: Ultrasound guided core biopsy performed of an enlarged left inguinal lymph node.   Original Report  Authenticated By: Irish Lack, M.D.    Scheduled Meds: . heparin  5,000 Units Subcutaneous Q8H  . levofloxacin (LEVAQUIN) IV  750 mg Intravenous Q24H  . metronidazole  500 mg Intravenous Q8H  . sodium chloride  3 mL Intravenous Q12H   Continuous Infusions: . 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 If 7PM-7AM, please contact night-coverage at [email or web address], password [facility] 01/16/2013, 11:52 AM  LOS: 6 days

## 2013-01-17 DIAGNOSIS — R1900 Intra-abdominal and pelvic swelling, mass and lump, unspecified site: Secondary | ICD-10-CM | POA: Diagnosis present

## 2013-01-17 LAB — BASIC METABOLIC PANEL
Calcium: 8.4 mg/dL (ref 8.4–10.5)
Creatinine, Ser: 0.54 mg/dL (ref 0.50–1.10)
GFR calc Af Amer: >90 mL/min (ref >90)
GFR calc non Af Amer: >90 mL/min (ref >90)
Sodium: 135 mEq/L (ref 135–145)

## 2013-01-17 LAB — CBC
MCH: 29.2 pg (ref 26.0–34.0)
MCHC: 32.8 g/dL (ref 30.0–36.0)
MCV: 88.9 fL (ref 78.0–100.0)
Platelets: 473 10*3/uL — ABNORMAL HIGH (ref 150–400)
RDW: 13 % (ref 11.5–15.5)

## 2013-01-17 LAB — MAGNESIUM: Magnesium: 1.7 mg/dL (ref 1.5–2.5)

## 2013-01-17 MED ORDER — LEVOFLOXACIN 500 MG PO TABS: 500.0000 mg | ORAL_TABLET | Freq: Every day | ORAL | Status: DC

## 2013-01-17 MED ORDER — HYDROCODONE-ACETAMINOPHEN 5-325 MG PO TABS: 1.0000 | ORAL_TABLET | Freq: Four times a day (QID) | ORAL | Status: DC | PRN

## 2013-01-17 MED ORDER — METRONIDAZOLE 500 MG PO TABS: 500.0000 mg | ORAL_TABLET | Freq: Three times a day (TID) | ORAL | Status: DC

## 2013-01-17 NOTE — Progress Notes (Signed)
Patient ID: Kirsten Schroeder, female   DOB: 07-01-1955, 57 y.o.   MRN: [id number] 5 Days Post-Op  Subjective: Doing well, no complaints today, was told by Dr. Alcide Evener that Gyn would see her today, denies n/v, +BMs, wants to go home  Objective: Vital signs in last 24 hours: Temp:  [98.2 F (36.8 C)-98.6 F (37 C)] 98.3 F (36.8 C) (08/27 0606) Pulse Rate:  [77-80] 80 (08/27 0606) Resp:  [20] 20 (08/27 0606) BP: (126-147)/(72-79) 147/79 mmHg (08/27 0606) SpO2:  [94 %-97 %] 94 % (08/27 0606) Last BM Date: 01/15/13  Intake/Output from previous day: 08/26 0701 - 08/27 0700 In: 600 [P.O.:600] Out: -  Intake/Output this shift:    PE: Abd: soft, nontender, +BS, no hematoma General: NAD  Lab Results:   Recent Labs  01/16/13 0427 01/17/13 0430  WBC 14.5* 13.4*  HGB 10.5* 10.0*  HCT 32.7* 30.5*  PLT 449* 473*   BMET  Recent Labs  01/16/13 0427 01/17/13 0430  NA 137 135  K 3.8 3.6  CL 106 103  CO2 24 25  GLUCOSE 118* 109*  BUN 3* 3*  CREATININE 0.54 0.54  CALCIUM 8.5 8.4   PT/INR No results found for this basename: LABPROT, INR,  in the last 72 hours CMP     Component Value Date/Time   NA 135 01/17/2013 0430   K 3.6 01/17/2013 0430   CL 103 01/17/2013 0430   CO2 25 01/17/2013 0430   GLUCOSE 109* 01/17/2013 0430   BUN 3* 01/17/2013 0430   CREATININE 0.54 01/17/2013 0430   CREATININE 0.77 01/05/2013 1616   CALCIUM 8.4 01/17/2013 0430   PROT 7.8 01/10/2013 1135   ALBUMIN 3.2* 01/10/2013 1135   AST 18 01/10/2013 1135   ALT 13 01/10/2013 1135   ALKPHOS 66 01/10/2013 1135   BILITOT 0.9 01/10/2013 1135   GFRNONAA >90 01/17/2013 0430   GFRAA >90 01/17/2013 0430   Lipase  No results found for this basename: lipase       Studies/Results: US Biopsy  01/15/2013   *RADIOLOGY REPORT*  Clinical Data: Large pelvic mass and enlarged bilateral inguinal lymph nodes.  The patient presents for lymph node biopsy.  ULTRASOUND GUIDED CORE BIOPSY OF LEFT INGUINAL LYMPH NODE  Sedation:  2.0  mg IV Versed;  100 mcg IV Fentanyl  Total Moderate Sedation Time: 50 minutes.  Procedure:  The procedure, risks, benefits, and alternatives were explained to the patient.  Questions regarding the procedure were encouraged and answered.  The patient understands and consents to the procedure.  The left inguinal region was prepped with Betadine in a sterile fashion, and a sterile drape was applied covering the operative field.  A sterile gown and sterile gloves were used for the procedure. Local anesthesia was provided with 1% Lidocaine.  Ultrasound was performed of both inguinal regions.  The left was chosen for biopsy.  Under ultrasound guidance, 18 gauge core biopsy samples were obtained of the lymph node.  A total of four samples were submitted in saline.  Complications: None  Findings: Left inguinal lymph nodes are visibly larger by ultrasound.  Two adjacent lymph nodes measure in conglomerate dimensions approximately 1.7 x 3.7 x 2.0 cm.  Samples were obtained through this area.  Solid tissue was obtained.  IMPRESSION: Ultrasound guided core biopsy performed of an enlarged left inguinal lymph node.   Original Report Authenticated By: Irish Lack, M.D.    Anti-infectives: Anti-infectives   Start     Dose/Rate Route Frequency Ordered Stop  01/13/13 1200  levofloxacin (LEVAQUIN) IVPB 750 mg     750 mg 100 mL/hr over 90 Minutes Intravenous Every 24 hours 01/13/13 1017     01/13/13 1200  metroNIDAZOLE (FLAGYL) IVPB 500 mg     500 mg 100 mL/hr over 60 Minutes Intravenous Every 8 hours 01/13/13 1017     01/12/13 2000  ciprofloxacin (CIPRO) IVPB 400 mg     400 mg 200 mL/hr over 60 Minutes Intravenous Every 12 hours 01/12/13 1519 01/13/13 0936   01/10/13 2000  vancomycin (VANCOCIN) IVPB 750 mg/150 ml premix  Status:  Discontinued     750 mg 150 mL/hr over 60 Minutes Intravenous Every 12 hours 01/10/13 1957 01/13/13 1016       Assessment/Plan LARGE PELVIC MASS, Mesentery mass noted ?  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

## 2013-01-17 NOTE — Progress Notes (Signed)
ATTENDING ADDENDUM:  I personally reviewed patient's record, examined the patient, and formulated the following assessment and plan:  Pt would like to go home.  Fistula questionable.  CA125 elevated.  Ok for d/c from my standpoint.  Can f/u with Gyn-Onc.  Will likely need general surgeon present during any operations to attempt removal of this pelvic mass, given h/o abd surgery, crohn's and questionable fistula.

## 2013-01-17 NOTE — Progress Notes (Signed)
Patient discharged to home with family assisting with transportation.  Patient verbalized an understanding of all discharge instructions.   Philomena Doheny RN

## 2013-01-17 NOTE — Discharge Summary (Signed)
[facility]  Physician Discharge Summary   Patient ID: Kirsten Schroeder MRN: [id number] DOB/AGE: [birth year] 57 y.o.  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 Has close follow up with Gyn ONC.  DISCHARGE CONDITION: fair  Diet recommendation: Low Sodium  Filed Weights   01/10/13 2050  Weight: 61.916 kg (136 lb 8 oz)    INITIAL HISTORY: 57 year old admitted 8-20 with right side abdominal pain, nausea, vomiting, fever. Had UA done at PCP office that grew 80 K enterococcus. She had a CT abdomen and pelvis in the ED that show pelvic mass and right side hydronephrosis. She underwent stent placement by Dr. Retta Diones on 8-22. She had a repeat CT scan abdomen and pelvis but with contrast that showed: pelvic mass, Small bowel fistula into the anterior, superior aspect of the mass, metastatic pelvic, mesenteric and retroperitoneal adenopathy. 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 Small left ventral hernia containing hernia small bowel without obstruction. Mild hepatic steatosis. She underwent left inguinal lymph node biopsy 8-25. Pathology report is pending. She was placed on IV Levaquin and flagyl for treatment of UTI and to cover for any intraabdominal infection in setting of possible small bowel fistula to the mass.   Consultants:  Onc: Dr Truett Perna.  Urology: Dr Retta Diones GYN ONC: Dr. Stanford Breed Gen Surg: Dr. Clovis Pu  Procedures:  8/25: US guided biopsy of pelvic mass  8/22: Cystoscopy, right retrograde ureteropyelogram, double-J stent placement on  right (difficult)    HOSPITAL COURSE:   Pelvic Mass with Retroperitoneal and pelvic pathologic adenopathy Mass inseparable from the uterus with cystic and solid elements. Patient has been seen by GYN Onc and they plan to see her in their office on 8/29. Biopsy has been performed and pathology is pending. Possibility of small bowel to mass fistula raised on CT. This is not very likely per surgery but can be pursued as OP if GYN feels it is necessary. For now we will continue Flagyl.   Sepsis due to enterococcus UTI Will give Levaquin for 6 more days. She remains stable and afebrile.   Hyponatremia Resolved with IV fluids.   Hydronephrosis, right Secondary to the mass.  Stent placed by urology. They will follow. Renal function stable.   Leukocytosis Stable. She remains afebrile. Can follow up as OP.   Anemia Stable Hgb.   Overall she remains stable.She is keen on going home. Discussed with Gen surgery and Urology. Dr. Lauraine Rinne had already spoken to Gyn Onc. She has a close follow up with them. She is stable for discharge.   PERTINENT LABS:  The results of significant diagnostics from this hospitalization (including imaging, microbiology, ancillary and laboratory) are listed below for reference.    Microbiology: Recent Results (from the past 240 hour(s))  URINE CULTURE     Status: None   Collection Time    01/08/13  4:37 PM      Result Value Range Status   Culture ENTEROCOCCUS SPECIES   Final   Colony Count 80,000 COLONIES/ML   Final   Organism ID, Bacteria ENTEROCOCCUS SPECIES   Final  URINE CULTURE     Status: None  Collection Time    01/10/13  6:46 PM      Result Value Range Status   Specimen Description URINE, CLEAN CATCH   Final   Special Requests NONE   Final   Culture  Setup Time     Final   Value: 01/11/2013 01:14     Performed at [facility] Count     Final   Value: 30,000 COLONIES/ML     Performed at [facility]   Culture     Final    Value: Multiple bacterial morphotypes present, none predominant. Suggest appropriate recollection if clinically indicated.     Performed at [facility]   Report Status 01/11/2013 FINAL   Final  SURGICAL PCR SCREEN     Status: None   Collection Time    01/12/13  7:37 AM      Result Value Range Status   MRSA, PCR NEGATIVE  NEGATIVE Final   Staphylococcus aureus NEGATIVE  NEGATIVE Final   Comment:            The Xpert SA Assay (FDA     approved for NASAL specimens     in patients over 78 years of age),     is one component of     a comprehensive surveillance     program.  Test performance has     been validated by [employer] for patients greater     than or equal to 64 year old.     It is not intended     to diagnose infection nor to     guide or monitor treatment.  SURGICAL PCR SCREEN     Status: None   Collection Time    01/16/13  3:08 PM      Result Value Range Status   MRSA, PCR NEGATIVE  NEGATIVE Final   Staphylococcus aureus NEGATIVE  NEGATIVE Final   Comment:            The Xpert SA Assay (FDA     approved for NASAL specimens     in patients over 55 years of age),     is one component of     a comprehensive surveillance     program.  Test performance has     been validated by [employer] for patients greater     than or equal to 84 year old.     It is not intended     to diagnose infection nor to     guide or monitor treatment.     Labs: Basic Metabolic Panel:  Recent Labs Lab 01/13/13 0812 01/14/13 0513 01/15/13 0448 01/16/13 0427 01/17/13 0430  NA 134* 138 135 137 135  K 3.3* 3.5 3.1* 3.8 3.6  CL 102 106 104 106 103  CO2 24 24 24 24 25   GLUCOSE 133* 106* 107* 118* 109*  BUN 8 5* 4* 3* 3*  CREATININE 0.65 0.64 0.57 0.54 0.54  CALCIUM 8.7 8.8 8.3* 8.5 8.4  MG  --   --   --  1.4* 1.7   CBC:  Recent Labs Lab 01/13/13 0812 01/14/13 0513 01/15/13 0448 01/16/13 0427 01/17/13 0430  WBC 15.3* 12.1* 12.4* 14.5* 13.4*  HGB 9.8*  9.6* 9.4* 10.5* 10.0*  HCT 29.0* 29.6* 28.9* 32.7* 30.5*  MCV 88.7 90.0 88.9 89.3 88.9  PLT 437* 473* 444* 449* 473*    IMAGING STUDIES Ct Abdomen Pelvis Wo Contrast  01/10/2013   CLINICAL DATA:  Right flank pain. Hydronephrosis. Hematuria. Leukocytosis.  EXAM: CT ABDOMEN AND PELVIS WITHOUT CONTRAST  TECHNIQUE: Multidetector CT imaging of the abdomen and pelvis was performed following the standard protocol without intravenous contrast.  COMPARISON:  None  FINDINGS: Subsegmental atelectasis, right lower lobe and lingula. The noncontrast CT appearance of the liver, spleen, pancreas, and adrenal glands is within normal limits. Gallbladder surgically absent. Left kidney unremarkable.  Moderate right hydronephrosis noted with hydroureter to the level of the iliac vessel cross over. Right-sided ostomy with peristomal hernia containing adipose tissue and loops of bowel, without specific findings of strangulation or obstruction.  However, there is also a large mass inseparable from the uterus with cystic and solid elements, and with speckled gas density along its upper margin which may be from adherent bowel or extraluminal gas. Combined length of the uterus and mass is approximately 16.0 cm from the cervix to the top of the mass. Adjacent mesenteric stranding noted.  There is also a retroperitoneal and pelvic adenopathy including a 1.2 cm left periaortic node on image 34 of series 2, a 1.3 cm AP window lymph node on image 45 of series 2, a 1.7 cm right external iliac lymph node on image 67 of series 2, a 1.6 cm right inguinal lymph node on image 73 of series 2, and a 1.8 cm left inguinal lymph node on image 72 of series 2, among others. There are clips in the expected location of the rectum, along with presacral stranding which is probably postoperative.  IMPRESSION: 1. Retroperitoneal and pelvic pathologic adenopathy, irregular gas density along the anterior upper margin of this heterogeneous mass. Top  differential diagnostic considerations include GI stromal tumor; uterine sarcoma or endometrial carcinoma; lymphoma; or colon cancer. Correlate with patient history. The irregular gas along the anterior margin could represent an adherent bowel, abscess, or gas in a GI stromal tumor. This mass appears to be causing extrinsic mass effect on the ureter leading to the moderate degree of right hydronephrosis. 2. 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   Electronically Signed   By: Herbie Baltimore   On: 01/10/2013 18:18   Dg Chest 2 View  01/11/2013   CLINICAL DATA:  Preop abdominal mass.  EXAM: CHEST  2 VIEW  COMPARISON:  None.  FINDINGS: Preop abdominal mass.  IMPRESSION: No active cardiopulmonary disease.   Electronically Signed   By: Charlett Nose   On: 01/11/2013 14:17   US Abdomen Complete  01/10/2013   *RADIOLOGY REPORT*  Clinical Data:  Pain  COMPLETE ABDOMINAL ULTRASOUND  Comparison:  None.  Findings:  Gallbladder:  Surgically removed.  Common bile duct:  3.5 mm.  Liver:  Increased echogenicity likely related to fatty infiltration.  No focal mass lesion is seen.  IVC:  Appears normal.  Pancreas:  No focal abnormality seen.  Spleen:  9.8 cm in length.  No mass lesion is noted.  Right Kidney:  10.1 cm in length.  Mild hydronephrosis is noted.  Left Kidney:  11 cm in length.  No hydronephrosis or mass lesion is seen.  Abdominal aorta:  No aneurysm identified.  IMPRESSION: Right-sided hydronephrosis.   Original Report Authenticated By: Alcide Clever, M.D.   Ct Abdomen Pelvis W Contrast  01/12/2013   CLINICAL DATA:  Followup pelvic mass is and right hydronephrosis.  EXAM: CT ABDOMEN AND PELVIS WITH CONTRAST  TECHNIQUE: Multidetector CT imaging of the abdomen and pelvis was performed using the standard protocol following bolus  administration of intravenous contrast.  CONTRAST:  OMNIPAQUE IOHEXOL 300 MG/ML  SOLN  COMPARISON:  Noncontrast CT dated 01/10/2013.  FINDINGS:  Interval right ureteral stent with associated air in the right renal collecting system. Mild improvement in dilatation of the collecting system with moderate dilatation remaining.  A large, heterogeneous, multiloculated pelvic mass is again demonstrated. There are adjacent surgical clips posteriorly. The mass measures 12.8 x 10.5 cm on image number 69 with an adjacent right external iliac lymph node with a short axis diameter of 1.9 cm. The mass measures 12.3 cm in length on sagittal image number 87. The uterus and ovaries are not identified separate from the mass. There is gas and contrast in the anterior aspect of the mass, superiorly, with a small bowel loop, containing contrast, extending into that area. There is a small fistula between the small bowel and the mass seen on sagittal image number 93.  There are multiple additional enlarged lymph nodes. These include a 1.4 cm short axis obturator lymph node on the right on image number 69, on 1.7 cm short axis right inguinal lymph node on image number 75, 1.6 cm short axis left inguinal lymph node on image number 75, 1.4 cm right pelvic mesenteric lymph node on image number 56, multiple additional inferior mesenteric enlarged lymph nodes, multiple enlarged retroperitoneal lymph nodes, including a 1.1 cm short axis left para-aortic node on image number 35 and 1.4 cm short axis aortocaval lymph node on image number 34.  A large right parastomal hernia is again demonstrated containing herniated small bowel without obstruction. There is also an additional mass within the herniated mesentery, measuring 3.2 x 2.7 cm on image number 62.  There is a small ventral hernia to the left of a midline surgical scar at the level of the mid to upper pelvis, containing herniated small bowel without obstruction. There is also small amount of fluid within this hernia.  Mild diffuse low density of the liver relative to the spleen. The spleen, pancreas, adrenal glands, left kidney and  urinary bladder are unremarkable. Cholecystectomy clips.  A small amount of atelectasis and scarring at the lung bases is unchanged. Lumbar lower thoracic spine degenerative changes are noted.  IMPRESSION: 1. 12.8 x 12.3 x 10.5 cm heterogeneous pelvic mass, as described above. Differential considerations include uterine sarcoma, ovarian carcinoma and intestinal malignancy.  2. Small bowel fistula into the anterior, superior aspect of the mass, as described above.  3.  Metastatic pelvic, mesenteric and retroperitoneal adenopathy.  4. 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  5. Mildly improved right hydronephrosis following ureteral stent placement.  6. Small left ventral hernia containing hernia small bowel without obstruction.  7. Mild hepatic steatosis.   Electronically Signed   By: Gordan Payment   On: 01/12/2013 19:42   US Biopsy  01/15/2013   *RADIOLOGY REPORT*  Clinical Data: Large pelvic mass and enlarged bilateral inguinal lymph nodes.  The patient presents for lymph node biopsy.  ULTRASOUND GUIDED CORE BIOPSY OF LEFT INGUINAL LYMPH NODE  Sedation:  2.0 mg IV Versed;  100 mcg IV Fentanyl  Total Moderate Sedation Time: 50 minutes.  Procedure:  The procedure, risks, benefits, and alternatives were explained to the patient.  Questions regarding the procedure were encouraged and answered.  The patient understands and consents to the procedure.  The left inguinal region was prepped with Betadine in a sterile fashion, and a sterile drape was applied covering the operative field.  A sterile gown and sterile gloves were used for the procedure. Local anesthesia was provided with 1% Lidocaine.  Ultrasound was performed of both inguinal regions.  The left was chosen for biopsy.  Under ultrasound guidance, 18 gauge core biopsy samples were obtained of the lymph node.  A total of four samples were submitted in saline.  Complications: None   Findings: Left inguinal lymph nodes are visibly larger by ultrasound.  Two adjacent lymph nodes measure in conglomerate dimensions approximately 1.7 x 3.7 x 2.0 cm.  Samples were obtained through this area.  Solid tissue was obtained.  IMPRESSION: Ultrasound guided core biopsy performed of an enlarged left inguinal lymph node.   Original Report Authenticated By: Irish Lack, M.D.    DISCHARGE EXAMINATION: Filed Vitals:   01/16/13 0533 01/16/13 1407 01/16/13 2206 01/17/13 0606  BP: 124/70 139/72 126/73 147/79  Pulse: 70 78 77 80  Temp: 98.6 F (37 C) 98.2 F (36.8 C) 98.6 F (37 C) 98.3 F (36.8 C)  TempSrc: Oral Oral Oral Oral  Resp: 18 20 20 20   Height:      Weight:      SpO2: 96% 97% 95% 94%   General appearance: alert, cooperative, appears stated age and no distress Resp: clear to auscultation bilaterally Cardio: regular rate and rhythm, S1, S2 normal, no murmur, click, rub or gallop GI: Soft, tender mass palpated near suprapubic area. BS present. Ileostomy noted. Neurologic: No focal deficits.  DISPOSITION: Home  Discharge Orders   Future Appointments Provider Department Dept Phone   01/19/2013 2:45 PM Jeannette Corpus, MD [facility] [phone]   Future Orders Complete By Expires   Diet - low sodium heart healthy  As directed    Discharge instructions  As directed    Comments:     Please be sure to follow up with Dr. Loree Fee (GYN Oncology) as scheduled. Dr. Mateo Flow office will call you for follow up.   Increase activity slowly  As directed       ALLERGIES:  Allergies  Allergen Reactions  . Penicillins     REACTION: RASH    Current Discharge Medication List    START taking these medications   Details  HYDROcodone-acetaminophen (NORCO/VICODIN) 5-325 MG per tablet Take 1 tablet by mouth every 6 (six) hours as needed. Qty: 30 tablet, Refills: 0    levofloxacin (LEVAQUIN) 500 MG tablet Take 1 tablet (500 mg  total) by mouth daily. Qty: 7 tablet, Refills: 0    metroNIDAZOLE (FLAGYL) 500 MG tablet Take 1 tablet (500 mg total) by mouth 3 (three) times daily. Qty: 21 tablet, Refills: 0      CONTINUE these medications which have NOT CHANGED   Details  Calcium Carbonate-Vitamin D (CALCIUM + D PO) Take 1 tablet by mouth daily.     Flaxseed, Linseed, (FLAXSEED OIL PO) Take 400 mg by mouth daily.    lisinopril-hydrochlorothiazide (ZESTORETIC) 10-12.5 MG per tablet Take 1 tablet by mouth daily. Qty: 90 tablet, Refills: 3   Associated Diagnoses: HTN (hypertension)    Multiple Vitamin (MULTIVITAMIN) tablet Take 1 tablet by mouth daily.      ondansetron (ZOFRAN ODT) 4 MG disintegrating tablet Take 1 tablet (4 mg total) by mouth every 8 (eight) hours as needed for nausea. Qty: 30 tablet, Refills: 0   Associated Diagnoses: Nausea with vomiting       Follow-up Information   Call Loreen Freud, DO. (As needed)    Specialty:  Family Medicine   Contact  information:   [address] [facility] [address] [place] [address] [phone]       Follow up with Jeannette Corpus, MD On 01/19/2013. (at 2:45PM. Please arrive 30 mins prior to appt.)    Specialty:  Gynecology   Contact information:   [address] [place] [address] [phone]       Follow up with DAHLSTEDT, Bertram Millard, MD. (His office will call you for follow up.)    Specialty:  Urology   Contact information:   [address] [place] [address] [phone]       TOTAL DISCHARGE TIME: 35 mins  [facility]  [facility] Pager [phone]  01/17/2013, 1:03 PM

## 2013-01-19 ENCOUNTER — Other Ambulatory Visit: Payer: Self-pay | Admitting: Oncology

## 2013-01-19 ENCOUNTER — Encounter: Payer: Self-pay | Admitting: Gynecology

## 2013-01-19 ENCOUNTER — Ambulatory Visit: Payer: BC Managed Care – PPO | Attending: Gynecology | Admitting: Gynecology

## 2013-01-19 VITALS — BP 101/42 | HR 86 | Temp 99.1°F | Resp 16

## 2013-01-19 DIAGNOSIS — Z79899 Other long term (current) drug therapy: Secondary | ICD-10-CM | POA: Insufficient documentation

## 2013-01-19 DIAGNOSIS — R599 Enlarged lymph nodes, unspecified: Secondary | ICD-10-CM | POA: Insufficient documentation

## 2013-01-19 DIAGNOSIS — I10 Essential (primary) hypertension: Secondary | ICD-10-CM | POA: Insufficient documentation

## 2013-01-19 DIAGNOSIS — Z932 Ileostomy status: Secondary | ICD-10-CM | POA: Insufficient documentation

## 2013-01-19 DIAGNOSIS — IMO0002 Reserved for concepts with insufficient information to code with codable children: Secondary | ICD-10-CM | POA: Insufficient documentation

## 2013-01-19 DIAGNOSIS — R1900 Intra-abdominal and pelvic swelling, mass and lump, unspecified site: Secondary | ICD-10-CM | POA: Insufficient documentation

## 2013-01-19 DIAGNOSIS — C801 Malignant (primary) neoplasm, unspecified: Secondary | ICD-10-CM | POA: Insufficient documentation

## 2013-01-19 DIAGNOSIS — C774 Secondary and unspecified malignant neoplasm of inguinal and lower limb lymph nodes: Secondary | ICD-10-CM | POA: Insufficient documentation

## 2013-01-19 DIAGNOSIS — C772 Secondary and unspecified malignant neoplasm of intra-abdominal lymph nodes: Secondary | ICD-10-CM

## 2013-01-19 NOTE — Patient Instructions (Signed)
We'll schedule an initial chemotherapy with Dr. Acquanetta Chain.

## 2013-01-19 NOTE — Progress Notes (Signed)
Consult Note: Gyn-Onc   Kirsten Schroeder 57 y.o. female  Chief Complaint  Patient presents with  . 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 Given the extent of disease in the patient's overall condition, I would recommend we is sutured therapy in a neoadjuvant fashion using dose dense carboplatin and Taxol. If she has her response after 3 cycles we would consider interval debulking.  The patient's informed that she may have a fistula from the small bowel to the pelvic mass that could result in peritonitis. She is instructed to inform us if she has increasing pain fever or any other abdominal symptoms.  We will refer the patient to Dr.[name] to initiate chemotherapy. Interval History: The patient returns for followup. She was discharged the hospital 2 days ago and feels much better than on admission.. Subsequent to our visit last week, she's had a biopsy of an inguinal lymph node area final pathology is consistent with a serous carcinoma a gynecologic origin. Review of her CT scan shows a large pelvic mass and what I believe to be a small uterus. She also has extensive adenopathy. While hospitalized she had a right ureteral stent placed because of right hydronephrosis. The patient reports that she has less edema of her leg and is voiding less frequently subsequent to placement of the stent.  HPI:57 year old white female initially seen in consultation at the request of Dr Mancel Bale regarding a newly diagnosed pelvic mass. The patient was initially admitted for evaluation of hydronephrosis and a UTI. Further evaluation revealed a pelvic mass retroperitoneal adenopathy. From a gynecologic point of view the patient denies any past gynecologic history. She said she had a pelvic examination in June which is relatively normal. 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 Patient denies  any pelvic pain. She went through menopause at about age 73 and has not had any bleeding.   Review of Systems:10 point review of systems is negative except as noted in interval history.   Vitals: Blood pressure 101/42, pulse 86, temperature 99.1 F (37.3 C), temperature source Oral, resp. rate 16.  Physical Exam: General : The patient is a healthy woman in no acute distress.  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 This is easily reducible. In addition there is a suprapubic mass extending approximately 6 cm above the pubis which is firm and tender. I do not detect any ascites.  Pelvic:  EGBUS: Normal female  Vagina: Stenotic. The patient does not tolerate a speculum exam even using a narrow speculum.  Bi-manual examination: A single finger bimanual exam is not well tolerated either. The patient's in considerable discomfort.  Lower extremities: No edema or varicosities. Normal range of motion    Allergies  Allergen Reactions  . Penicillins     REACTION: RASH    Past Medical History  Diagnosis Date  . Hypertension   . Colitis     [place]  . GI bleed     Past Surgical History  Procedure Laterality Date  . Ileostomy  12/27/1988    Removal of Large Intestine---Tim Earlene Plater, Careers adviser  . Cholecystectomy  1991-1992  . Cystoscopy w/ ureteral stent placement Right 01/12/2013    Procedure: CYSTOSCOPY WITH RETROGRADE PYELOGRAM/ RIGHT DOUBLE J STENT PLACEMENT;  Surgeon: Marcine Matar, MD;  Location: [facility];  Service: Urology;  Laterality: Right;    Current Outpatient Prescriptions  Medication Sig Dispense Refill  . Calcium Carbonate-Vitamin D (CALCIUM + D PO) Take 1 tablet by mouth daily.       . Flaxseed, Linseed, (FLAXSEED OIL PO) Take 400 mg by mouth daily.      Marland Kitchen  HYDROcodone-acetaminophen (NORCO/VICODIN) 5-325 MG per tablet Take 1 tablet by mouth every 6 (six) hours as needed.  30 tablet  0  . levofloxacin (LEVAQUIN) 500 MG tablet Take 1 tablet (500 mg total) by mouth daily.  7 tablet  0  . lisinopril-hydrochlorothiazide (ZESTORETIC) 10-12.5 MG per tablet Take 1 tablet by mouth daily.  90 tablet  3  . metroNIDAZOLE (FLAGYL) 500 MG tablet Take 1 tablet (500 mg total) by mouth 3 (three) times daily.  21 tablet  0  . Multiple Vitamin (MULTIVITAMIN) tablet Take 1 tablet by mouth daily.        . ondansetron (ZOFRAN ODT) 4 MG disintegrating tablet Take 1 tablet (4 mg total) by mouth every 8 (eight) hours as needed for nausea.  30 tablet  0   No current facility-administered medications for this visit.    History   Social History  . Marital Status: Divorced    Spouse Name: N/A    Number of Children: 2  . 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 Smoking status: Never Smoker   . Smokeless tobacco: Never Used  . Alcohol Use: No  . Drug Use: No  . Sexual Activity: No   Other Topics Concern  . Not on file   Social History Narrative   Gets reg exercise    Family History  Problem Relation Age of Onset  . Hypertension Mother   . Arthritis Mother   . Hypertension Father   . Diabetes Father   . Hypertension Brother   . Diabetes Brother       Jeannette Corpus, MD 01/19/2013, 1:42 PM

## 2013-01-24 ENCOUNTER — Telehealth: Payer: Self-pay | Admitting: Oncology

## 2013-01-24 ENCOUNTER — Ambulatory Visit (HOSPITAL_BASED_OUTPATIENT_CLINIC_OR_DEPARTMENT_OTHER): Payer: BC Managed Care – PPO | Admitting: Oncology

## 2013-01-24 VITALS — BP 129/83 | HR 88 | Temp 98.7°F | Resp 18 | Ht 61.0 in | Wt 139.2 lb

## 2013-01-24 DIAGNOSIS — C801 Malignant (primary) neoplasm, unspecified: Secondary | ICD-10-CM

## 2013-01-24 DIAGNOSIS — C772 Secondary and unspecified malignant neoplasm of intra-abdominal lymph nodes: Secondary | ICD-10-CM

## 2013-01-24 NOTE — Progress Notes (Signed)
[facility] NEW PATIENT EVALUATION   Name: Kirsten Schroeder Date: 01/24/2013 MRN: [id number] DOB: [birth year]  REFERRING PHYSICIAN: Lina Sayre Schroeder:[id number], [name], DO; Rob Bunting; Pennie Rushing, Einar Crow, [name]   REASON FOR REFERRAL: new diagnosis of advanced gyn carcinoma, for neoadjuvant chemotherapy    HISTORY OF PRESENT ILLNESS:Kirsten Schroeder is a 57 y.o. female who is seen in for the first time by myself now, having had consultation in hospital by Dr Mancel Bale during diagnostic work up. 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 She had been generally stable until contracting norovirus in May 2014, with prolonged illness and weight loss then. She had yearly PE and first gyn exam by Dr Pennie Rushing in June 2014. In July 2014 she developed RLE swelling and pain, seen by PCP Dr Laury Axon with venous dopplers reportedly negative, however those symptoms persisted. She developed nausea, vomiting and temperature 101 8-16- 2014, with abdominal US 01-10-13 which showed mild right hydronephrosis. She was then admitted to [facility] from 8-20 thru 01-17-13; with evaluation including CT AP x2; consultations by Drs Retta Diones, Truett Perna, Cornett and ClarkePearson;right ureteral stent placement; and US biopsy of left inguinal lymph node. 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  She has seen Dr Yolande Jolly since DC from hospital, with recommendation for dose dense taxol carboplatin, with possible interval debulking depending on response after ~  3 cycles. She has not had chemotherapy education class yet. She has difficult peripheral venous access, had 2 PACs during previous treatment for ulcerative colitis, and would be willing to have PAC placed for chemotherapy. She has not been followed regularly by general surgery since  Dr Kendrick Ranch.  REVIEW OF SYSTEMS:  Feeling better since hospitalization. Gross hematuria after ureteral stent placement has resolved, no other noted bleeding. Stent not particularly noticeable or uncomfortable. No fever. No different abdominal pain other than with exams, tho chronic problems from ventral hernia. Eating usual small meals several times daily and pushes fluids due to volume of output from ileostomy. Nausea and vomiting have resolved. Is able to sleep. Decreased swelling RLE. Is up all thru day. Good weight ~ 140 - 142 lbs (lost >100 lbs when first ill with ulcerative colitis). No HA, good visual acuity with glasses, upper dental plate without active dental problems known, good hearing, no known thyroid disease, no respiratory symptoms, checks BP at home and has had to hold antihypertensive intermittently, no changes in breasts, GERD resolved, no arthritis. No peripheral neuropathy.  ALLERGIES: Penicillins  PAST MEDICAL/ SURGICAL HISTORY:    Total colectomy with ileostomy for ulcerative colitis by Dr Kendrick Ranch 1990, this after she had been out of remission x 2 years prior. >100 lb weight loss then.  Several blood transfusions with ulcerative colitis 2 PACs during treatment of ulcerative colitis Cholecystectomy 1992 HTN Mammograms due Oct at [facility]  CURRENT MEDICATIONS: reviewed as listed in EMR. Will send prescriptions for ativan 0.5 mg po or SL q 6 hr prn nausea and for EMLA cream  PHARMACY    [facility] [place]   SOCIAL HISTORY:  Originally from [place]. Divorced after colectomy, "I still love my husband, but he is a perfectionist and could not tolerate the ostomy, so I had to do what was best for him".  Son age 22 lives nearby, daughter age 11 lives with patient. No tobacco or ETOH. 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  FMLA papers completed today; she has over 200 leave days accrued and will need to be out of work at least as she begins this treatment.  FAMILY HISTORY:  Maternal aunt and paternal aunt with ulcerative colitis Father DM and HTN Mother HTN, arthritis Brother DM, HTN Sister ophthalmologic disease Children healthy. No grands.          PHYSICAL EXAM:  height is 5\' 1"  (1.549 m) and weight is 139 lb 3.2 oz (63.141 kg). Her oral temperature is 98.7 F (37.1 C). Her blood pressure is 129/83 and her pulse is 88. Her respiration is 18.  Alert, pleasant, cooperative. Ambulatory slowly, looks mildly uncomfortable and tired, but excellent historian  HEENT: normal hair pattern, PERRL, not icteric. Oral mucosa slightly dry and clear, posterior pharynx likewise. Upper dentures. Neck supple without JVD.  RESPIRATORY clear to auscultation and percssion BACK nontender to palpation. Well healed scars from previous PACs  CARDIAC/ VASCULAR  RRR without gallop  ABDOMEN soft, obese with redundant tissue from weight loss, ileostomy on right with tan liquid in ostomy bag. No palpable HSM, RLQ parastomal hernia. Suprapubic fullness. Some bowel sounds present. Not obviously distended.  LYMPH NODES  No cervical, supraclavicular, axillary or inguinal adenopathy.  BREASTS bilaterally without dominant mass, skin or nipple findings, axillae benign  NEUROLOGIC  CN, motor, sensory, cerebellar nonfocal  SKIN without rash, ecchymosis, petechiae  MUSCULOSKELETAL Minimal swelling right lower leg, otherwise no edema, cords, tenderness of extremities    LABORATORY DATA: 01-17-2013   CBC with WBC 13.4,  Hgb 10, MCV 88.9, plt 473.  BMET with Na 135, K 3.6, Cl 103, CO2 25, glu 109, BUN 3, creat 0.54, ca 8.4  CA 125 on 01-16-13    5626   PATHOLOGY Accession: [id number] Received: 08/25/2014FINAL DIAGNOSIS Diagnosis Lymph node, needle/core biopsy - POSITIVE FOR CARCINOMA, SEE COMMENT. Microscopic  Comment Immunohistochemical stains are performed. The tumor is positive for cytokeratin 7, WT-1, p16, and estrogen receptor. The tumor is negative for TTF-1, cytokeratin 20 and CDX-2. The morphology, coupled with the staining pattern, is consistent with a serous carcinoma of gynecologic source.   RADIOGRAPHY:   CT ABDOMEN AND PELVIS WITH CONTRAST 01-12-13  COMPARISON: Noncontrast CT dated 01/10/2013.  FINDINGS:  Interval right ureteral stent with associated air in the right renal  collecting system. Mild improvement in dilatation of the collecting  system with moderate dilatation remaining.  A large, heterogeneous, multiloculated pelvic mass is again  demonstrated. There are adjacent surgical clips posteriorly. The  mass measures 12.8 x 10.5 cm on image number 69 with an adjacent  right external iliac lymph node with a short axis diameter of 1.9  cm. The mass measures 12.3 cm in length on sagittal image number 87.  The uterus and ovaries are not identified separate from the mass.  There is gas and contrast in the anterior aspect of the mass,  superiorly, with a small bowel loop, containing contrast, extending  into that area. There is a small fistula between the small bowel and  the mass seen on sagittal image number 93.  There are multiple additional enlarged lymph nodes. These include a  1.4 cm short axis obturator lymph node on the right on image number  69, on 1.7 cm short axis right inguinal lymph node on image number  75, 1.6 cm short axis  left inguinal lymph node on image number 75,  1.4 cm right pelvic mesenteric lymph node on image number 56,  multiple additional inferior mesenteric enlarged lymph nodes,  multiple enlarged retroperitoneal lymph nodes, including a 1.1 cm  short axis left para-aortic node on image number 35 and 1.4 cm short  axis aortocaval lymph node on image number 34.  A large right parastomal hernia is again demonstrated containing  herniated small bowel  without obstruction. There is also an  additional mass within the herniated mesentery, measuring 3.2 x 2.7  cm on image number 62.  There is a small ventral hernia to the left of a midline surgical  scar at the level of the mid to upper pelvis, containing herniated  small bowel without obstruction. There is also small amount of fluid  within this hernia.  Mild diffuse low density of the liver relative to the spleen. The  spleen, pancreas, adrenal glands, left kidney and urinary bladder  are unremarkable. Cholecystectomy clips.  A small amount of atelectasis and scarring at the lung bases is  unchanged. Lumbar lower thoracic spine degenerative changes are  noted.  IMPRESSION:  1. 12.8 x 12.3 x 10.5 cm heterogeneous pelvic mass, as described  above. Differential considerations include uterine sarcoma, ovarian  carcinoma and intestinal malignancy.  2. Small bowel fistula into the anterior, superior aspect of the  mass, as described above.  3. Metastatic pelvic, mesenteric and retroperitoneal adenopathy.  4. Large right parastomal hernia containing an additional mass, most  likely representing mesenteric/peritoneal metastasis.  5. Mildly improved right hydronephrosis following ureteral stent  placement.  6. Small left ventral hernia containing hernia small bowel without  obstruction.  7. Mild hepatic steatosis.      CT ABDOMEN AND PELVIS WITHOUT CONTRAST 01-10-13  COMPARISON: None  FINDINGS:  Subsegmental atelectasis, right lower lobe and lingula. The  noncontrast CT appearance of the liver, spleen, pancreas, and  adrenal glands is within normal limits. Gallbladder surgically  absent. Left kidney unremarkable.  Moderate right hydronephrosis noted with hydroureter to the level of  the iliac vessel cross over. Right-sided ostomy with peristomal  hernia containing adipose tissue and loops of bowel, without  specific findings of strangulation or obstruction.  However, there is also  a large mass inseparable from the uterus with  cystic and solid elements, and with speckled gas density along its  upper margin which may be from adherent bowel or extraluminal gas.  Combined length of the uterus and mass is approximately 16.0 cm from  the cervix to the top of the mass. Adjacent mesenteric stranding  noted.  There is also a retroperitoneal and pelvic adenopathy including a  1.2 cm left periaortic node on image 34 of series 2, a 1.3 cm AP  window lymph node on image 45 of series 2, a 1.7 cm right external  iliac lymph node on image 67 of series 2, a 1.6 cm right inguinal  lymph node on image 73 of series 2, and a 1.8 cm left inguinal lymph  node on image 72 of series 2, among others. There are clips in the  expected location of the rectum, along with presacral stranding  which is probably postoperative.  IMPRESSION:  1. Retroperitoneal and pelvic pathologic adenopathy, irregular gas  density along the anterior upper margin of this heterogeneous mass.  Top differential diagnostic considerations include GI stromal tumor;  uterine sarcoma or endometrial carcinoma; lymphoma; or colon cancer.  Correlate with patient history. The irregular gas along the anterior  margin could represent an adherent bowel, abscess, or gas in a GI  stromal tumor. This mass appears to be causing extrinsic mass effect  on the ureter leading to the moderate degree of right  hydronephrosis.  2. Large right peristomal hernia containing multiple loops of small  bowel.    COMPLETE ABDOMINAL ULTRASOUND 8--20-14 Comparison: None.  Findings:  Gallbladder: Surgically removed.  Common bile duct: 3.5 mm.  Liver: Increased echogenicity likely related to fatty  infiltration. No focal mass lesion is seen.  IVC: Appears normal.  Pancreas: No focal abnormality seen.  Spleen: 9.8 cm in length. No mass lesion is noted.  Right Kidney: 10.1 cm in length. Mild hydronephrosis is noted.  Left Kidney: 11 cm in  length. No hydronephrosis or mass lesion is  seen.  Abdominal aorta: No aneurysm identified.  IMPRESSION:  Right-sided hydronephrosis.   Also CXR 01-11-13 no active cardiopulmonary disease   DISCUSSION: I have reviewed all of history and present information concerning present illness with patient now. 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 We have discussed the chemotherapy in general, including choice of dose dense regimen with weekly dosing, outpatient treatment, antiemetics and the PAC.    IMPRESSION / PLAN:  1.Newly diagnosed serous gyn carcinoma: begin neoadjuvant dose dense taxol carboplatin in near future. Needs chemotherapy education class and PAC placement. 2.Ulcerative colitis with total colectomy 1990, with ileostomy 3.Right ureteral obstruction from pelvic mass, with stent placed by Dr Retta Diones [phone],  very difficult procedure 4. Normocytic anemia: will check iron studies and B12 with next labs, and follow 5.difficult peripheral venous access: PAC by IR requested 6.post cholecystectomy 7.hx HTN. I have requested that she hold antihypertensive if BP < ~ 120 to 130 systolic 8.rash to PCN 9.multiple blood transfusions for ulcerative colitis problems previously     Patient had questions answered to her satisfaction and is in agreement with plan above. She understands that she can contact this office for questions or concerns at any time prior to next scheduled visit. I will see her again on 9-8 and expect to begin chemotherapy also that week.  Time spent  60 min , including >50% discussion and coordination of care.    Halley Kincer P, MD 01/24/2013 12:15 PM

## 2013-01-24 NOTE — Patient Instructions (Signed)
Call at any time if questions or concerns    [phone]

## 2013-01-25 ENCOUNTER — Encounter: Payer: Self-pay | Admitting: *Deleted

## 2013-01-25 ENCOUNTER — Other Ambulatory Visit: Payer: BC Managed Care – PPO

## 2013-01-25 ENCOUNTER — Other Ambulatory Visit: Payer: Self-pay | Admitting: Radiology

## 2013-01-25 ENCOUNTER — Encounter: Payer: Self-pay | Admitting: Oncology

## 2013-01-26 ENCOUNTER — Telehealth: Payer: Self-pay

## 2013-01-26 DIAGNOSIS — C772 Secondary and unspecified malignant neoplasm of intra-abdominal lymph nodes: Secondary | ICD-10-CM

## 2013-01-26 MED ORDER — LORAZEPAM 0.5 MG PO TABS: ORAL_TABLET | ORAL | Status: DC

## 2013-01-26 MED ORDER — LIDOCAINE-PRILOCAINE 2.5-2.5 % EX CREA: TOPICAL_CREAM | CUTANEOUS | Status: DC | PRN

## 2013-01-26 MED ORDER — DEXAMETHASONE 4 MG PO TABS: ORAL_TABLET | ORAL | Status: DC

## 2013-01-26 MED ORDER — ONDANSETRON HCL 8 MG PO TABS: 8.0000 mg | ORAL_TABLET | Freq: Two times a day (BID) | ORAL | Status: DC | PRN

## 2013-01-26 NOTE — Telephone Encounter (Signed)
Message copied by Lorine Bears on Fri Jan 26, 2013  5:27 PM ------      Message from: Jama Flavors P      Created: Thu Jan 25, 2013  5:41 PM       Please send scripts to [facility]   [employer] fine            Lorazepam  0.5 mg   1 po or SL q 6 hr prn nausea. Will make you drowsy.  #20 no RF            EMLA            Ondansetron 8 mg  1-2 every 12 hrs as needed for nausea #30  2 RF (she has 4 mg tabs at home, but likely will do better with this dose)            Decadron 4 mg   Five tablets with food 12 hrs before taxol   #15 for 3 weeks   No RF now            Thanks      Cc LA, TH, NW ------

## 2013-01-26 NOTE — Telephone Encounter (Signed)
Called in prescriptions as noted below.

## 2013-01-28 ENCOUNTER — Other Ambulatory Visit: Payer: Self-pay | Admitting: Oncology

## 2013-01-28 DIAGNOSIS — D500 Iron deficiency anemia secondary to blood loss (chronic): Secondary | ICD-10-CM

## 2013-01-28 DIAGNOSIS — C772 Secondary and unspecified malignant neoplasm of intra-abdominal lymph nodes: Secondary | ICD-10-CM

## 2013-01-29 ENCOUNTER — Ambulatory Visit (HOSPITAL_COMMUNITY)
Admission: RE | Admit: 2013-01-29 | Discharge: 2013-01-29 | Disposition: A | Discharge status: Home / Self Care | Payer: BC Managed Care – PPO | Source: Ambulatory Visit | Attending: Oncology | Admitting: Oncology

## 2013-01-29 ENCOUNTER — Other Ambulatory Visit (HOSPITAL_BASED_OUTPATIENT_CLINIC_OR_DEPARTMENT_OTHER): Payer: BC Managed Care – PPO | Admitting: Lab

## 2013-01-29 ENCOUNTER — Encounter: Payer: Self-pay | Admitting: Oncology

## 2013-01-29 ENCOUNTER — Other Ambulatory Visit: Payer: BC Managed Care – PPO | Admitting: Lab

## 2013-01-29 ENCOUNTER — Encounter (HOSPITAL_COMMUNITY): Payer: Self-pay

## 2013-01-29 ENCOUNTER — Telehealth: Payer: Self-pay | Admitting: *Deleted

## 2013-01-29 ENCOUNTER — Ambulatory Visit: Payer: BC Managed Care – PPO | Admitting: Oncology

## 2013-01-29 ENCOUNTER — Ambulatory Visit (HOSPITAL_BASED_OUTPATIENT_CLINIC_OR_DEPARTMENT_OTHER): Payer: BC Managed Care – PPO | Admitting: Oncology

## 2013-01-29 ENCOUNTER — Other Ambulatory Visit: Payer: Self-pay | Admitting: Oncology

## 2013-01-29 VITALS — BP 130/63 | HR 92 | Temp 98.1°F | Resp 18 | Ht 61.0 in | Wt 139.9 lb

## 2013-01-29 DIAGNOSIS — C801 Malignant (primary) neoplasm, unspecified: Secondary | ICD-10-CM

## 2013-01-29 DIAGNOSIS — M7989 Other specified soft tissue disorders: Secondary | ICD-10-CM

## 2013-01-29 DIAGNOSIS — C772 Secondary and unspecified malignant neoplasm of intra-abdominal lymph nodes: Secondary | ICD-10-CM

## 2013-01-29 DIAGNOSIS — I10 Essential (primary) hypertension: Secondary | ICD-10-CM | POA: Insufficient documentation

## 2013-01-29 DIAGNOSIS — D649 Anemia, unspecified: Secondary | ICD-10-CM

## 2013-01-29 DIAGNOSIS — C569 Malignant neoplasm of unspecified ovary: Secondary | ICD-10-CM | POA: Insufficient documentation

## 2013-01-29 LAB — CBC WITH DIFFERENTIAL/PLATELET
BASO%: 1 % (ref 0.0–2.0)
EOS%: 3.2 % (ref 0.0–7.0)
HCT: 35.8 % (ref 34.8–46.6)
MCH: 30.2 pg (ref 25.1–34.0)
MCHC: 33.8 g/dL (ref 31.5–36.0)
NEUT%: 75.5 % (ref 38.4–76.8)
RBC: 4.01 10*6/uL (ref 3.70–5.45)
RDW: 14.4 % (ref 11.2–14.5)
WBC: 9.8 10*3/uL (ref 3.9–10.3)
lymph#: 1.2 10*3/uL (ref 0.9–3.3)

## 2013-01-29 LAB — COMPREHENSIVE METABOLIC PANEL (CC13)
ALT: 9 U/L (ref 0–55)
AST: 15 U/L (ref 5–34)
Calcium: 9.8 mg/dL (ref 8.4–10.4)
Chloride: 100 mEq/L (ref 98–109)
Creatinine: 0.7 mg/dL (ref 0.6–1.1)
Sodium: 137 mEq/L (ref 136–145)
Total Protein: 7.2 g/dL (ref 6.4–8.3)

## 2013-01-29 LAB — PROTIME-INR: Prothrombin Time: 13 seconds (ref 11.6–15.2)

## 2013-01-29 MED ORDER — FENTANYL CITRATE 0.05 MG/ML IJ SOLN
INTRAMUSCULAR | Status: AC | PRN
  Administered 2013-01-29: 100 ug via INTRAVENOUS

## 2013-01-29 MED ORDER — MIDAZOLAM HCL 2 MG/2ML IJ SOLN
INTRAMUSCULAR | Status: AC | PRN
  Administered 2013-01-29: 2 mg via INTRAVENOUS

## 2013-01-29 MED ORDER — HEPARIN SOD (PORK) LOCK FLUSH 100 UNIT/ML IV SOLN
500.0000 [IU] | Freq: Once | INTRAVENOUS | Status: AC
  Administered 2013-01-29: 500 [IU] via INTRAVENOUS

## 2013-01-29 MED ORDER — LIDOCAINE HCL 1 % IJ SOLN
INTRAMUSCULAR | Status: AC
  Filled 2013-01-29: qty 20

## 2013-01-29 MED ORDER — FENTANYL CITRATE 0.05 MG/ML IJ SOLN
INTRAMUSCULAR | Status: AC
  Filled 2013-01-29: qty 4

## 2013-01-29 MED ORDER — MIDAZOLAM HCL 2 MG/2ML IJ SOLN
INTRAMUSCULAR | Status: AC
  Filled 2013-01-29: qty 4

## 2013-01-29 MED ORDER — SODIUM CHLORIDE 0.9 % IV SOLN
INTRAVENOUS | Status: DC
  Administered 2013-01-29: 10:00:00 via INTRAVENOUS

## 2013-01-29 MED ORDER — VANCOMYCIN HCL IN DEXTROSE 1-5 GM/200ML-% IV SOLN
1000.0000 mg | INTRAVENOUS | Status: AC
  Administered 2013-01-29: 1000 mg via INTRAVENOUS
  Filled 2013-01-29: qty 200

## 2013-01-29 NOTE — H&P (Signed)
Kirsten Schroeder is an 57 y.o. female.   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  Past Medical History  Diagnosis Date  . Hypertension   . Colitis     [place]  . GI bleed     Past Surgical History  Procedure Laterality Date  . Ileostomy  12/27/1988    Removal of Large Intestine---Tim Earlene Plater, Careers adviser  . Cholecystectomy  1991-1992  . Cystoscopy w/ ureteral stent placement Right 01/12/2013    Procedure: CYSTOSCOPY WITH RETROGRADE PYELOGRAM/ RIGHT DOUBLE J STENT PLACEMENT;  Surgeon: Marcine Matar, MD;  Location: [facility];  Service: Urology;  Laterality: Right;    Family History  Problem Relation Age of Onset  . Hypertension Mother   . Arthritis Mother   . Hypertension Father   . Diabetes Father   . Hypertension Brother   . Diabetes Brother    Social History:  reports that she has never smoked. She has never used smokeless tobacco. She reports that she does not drink alcohol or use illicit drugs.  Allergies:  Allergies  Allergen Reactions  . Penicillins     REACTION: RASH    Current outpatient prescriptions:Calcium Carbonate-Vitamin D (CALCIUM + D PO), Take 1 tablet by mouth daily. , Disp: , Rfl: ;  dexamethasone (DECADRON) 4 MG tablet, Take 20 mg by mouth once a week. Taken on Wednesdays., Disp: , Rfl: ;  lisinopril-hydrochlorothiazide (ZESTORETIC) 10-12.5 MG per tablet, Take 1 tablet by mouth daily., Disp: 90 tablet, Rfl: 3 Multiple Vitamin (MULTIVITAMIN WITH MINERALS) TABS tablet, Take 1 tablet by mouth daily., Disp: , Rfl: ;  HYDROcodone-acetaminophen (NORCO/VICODIN) 5-325 MG per tablet, Take 1 tablet by mouth every 6 (six) hours as needed., Disp: 30 tablet, Rfl: 0;  lidocaine-prilocaine (EMLA) cream, Apply topically as needed. Apply 1-2 hours prior to Porta-cath access., Disp: 30 g, Rfl: 2 LORazepam (ATIVAN) 0.5 MG tablet, Take 0.5 mg by  mouth every 6 (six) hours as needed (for nausea)., Disp: , Rfl: ;  omega-3 acid ethyl esters (LOVAZA) 1 G capsule, Take 1 g by mouth daily., Disp: , Rfl: ;  ondansetron (ZOFRAN) 8 MG tablet, Take 1-2 tablets (8-16 mg total) by mouth every 12 (twelve) hours as needed for nausea., Disp: 30 tablet, Rfl: 2 Current facility-administered medications:0.9 %  sodium chloride infusion, , Intravenous, Continuous, D Kevin Geovany Trudo, PA-C, Last Rate: 20 mL/hr at 01/29/13 1006;  vancomycin (VANCOCIN) IVPB 1000 mg/200 mL premix, 1,000 mg, Intravenous, On Call, D Jeananne Rama, PA-C   Results for orders placed in visit on 01/29/13 (from the past 48 hour(s))  CBC WITH DIFFERENTIAL     Status: Abnormal   Collection Time    01/29/13  8:00 AM      Result Value Range   WBC 9.8  3.9 - 10.3 10e3/uL   NEUT# 7.4 (*) 1.5 - 6.5 10e3/uL   HGB 12.1  11.6 - 15.9 g/dL   HCT 16.1  09.6 - 04.5 %   Platelets 365  145 - 400 10e3/uL   MCV 89.1  79.5 - 101.0 fL   MCH 30.2  25.1 - 34.0 pg   MCHC 33.8  31.5 - 36.0 g/dL   RBC 4.09  8.11 - 9.14 10e6/uL   RDW 14.4  11.2 - 14.5 %   lymph# 1.2  0.9 - 3.3 10e3/uL   MONO# 0.8  0.1 - 0.9 10e3/uL   Eosinophils Absolute 0.3  0.0 - 0.5 10e3/uL   Basophils Absolute 0.1  0.0 - 0.1 10e3/uL   NEUT% 75.5  38.4 - 76.8 %   LYMPH% 12.0 (*) 14.0 - 49.7 %   MONO% 8.3  0.0 - 14.0 %   EOS% 3.2  0.0 - 7.0 %   BASO% 1.0  0.0 - 2.0 %  COMPREHENSIVE METABOLIC PANEL (CC13)     Status: Abnormal   Collection Time    01/29/13  8:00 AM      Result Value Range   Sodium 137  136 - 145 mEq/L   Potassium 4.5  3.5 - 5.1 mEq/L   Chloride 100  98 - 109 mEq/L   CO2 28  22 - 29 mEq/L   Glucose 104  70 - 140 mg/dl   BUN 8.2  7.0 - 16.1 mg/dL   Creatinine 0.7  0.6 - 1.1 mg/dL   Total Bilirubin 0.96  0.20 - 1.20 mg/dL   Alkaline Phosphatase 73  40 - 150 U/L   AST 15  5 - 34 U/L   ALT 9  0 - 55 U/L   Total Protein 7.2  6.4 - 8.3 g/dL   Albumin 3.0 (*) 3.5 - 5.0 g/dL   Calcium 9.8  8.4 - 04.5 mg/dL   No  results found.  Review of Systems  Constitutional: Negative for fever and chills.  Respiratory: Negative for cough and shortness of breath.   Cardiovascular: Negative for chest pain.  Gastrointestinal: Negative for nausea, vomiting and abdominal pain.  Musculoskeletal: Negative for back pain.       Right lower ext swelling  Neurological: Negative for headaches.  Endo/Heme/Allergies: Does not bruise/bleed easily.    Blood pressure 137/82, temperature 98.6 F (37 C), temperature source Oral, resp. rate 18, height 5\' 1"  (1.549 m), weight 139 lb (63.05 kg), SpO2 97.00%. Physical Exam  Constitutional: She is oriented to person, place, and time. She appears well-developed and well-nourished.  Cardiovascular: Normal rate and regular rhythm.   Respiratory: Effort normal and breath sounds normal.  GI: Soft. Bowel sounds are normal.  Obese; Intact RLQ ostomy  Musculoskeletal: Normal range of motion.  1-2+ RLE edema, left without edema  Neurological: She is alert and oriented to person, place, and time.     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 Plan is for port a cath placement today for chemotherapy. Details/risks of procedure d/w pt with her understanding and consent.  Kirsten Schroeder,D KEVIN 01/29/2013, 10:16 AM

## 2013-01-29 NOTE — Procedures (Signed)
Procedure:  Porta-cath insertion Access: Right IJ vein Findings:  Tip at cavoatrial junction.  OK to use.

## 2013-01-29 NOTE — H&P (Signed)
Agree 

## 2013-01-29 NOTE — Progress Notes (Signed)
OFFICE PROGRESS NOTE   01/29/2013   Physicians:ClarkePearson, Reuel Boom; Buckner Malta, Duane Lope, Erie Noe; Dahlstedt, [name]   INTERVAL HISTORY:  Patient is seen, alone for visit, with preparations in process for neoadjuvant chemotherapy with dose dense carboplatin and taxol, to begin 01-31-13. She is for [facility] by IR today. She has had chemotherapy education completed. Patient has had no new or increased problems since she was seen last week. Swelling RLE is gradually improving, ureteral stent is not particularly uncomfortable, and she has had no fever or bleeding. Ileostomy is functioning as usual, no N/V, is eating usual multiple small meals daily.   ONCOLOGIC HISTORY Patient has history of ulcerative colitis for which she had total colectomy with ileostomy in 1990, and subsequently difficult gyn exams. She had been generally stable until contracting norovirus in May 2014, with prolonged illness and weight loss then. She had yearly PE and first gyn exam by Dr Pennie Rushing in June 2014. In July 2014 she developed RLE swelling and pain, seen by PCP Dr Laury Axon with venous dopplers reportedly negative, however those symptoms persisted. She developed nausea, vomiting and temperature 101 by 8-16- 2014, with abdominal US 01-10-13 which showed mild right hydronephrosis. She was then admitted to [facility] from 8-20 thru 01-17-13; with evaluation including CT AP x2; consultations by Drs Retta Diones, Truett Perna, Cornett and ClarkePearson;right ureteral stent placement; and US biopsy of left inguinal lymph node. 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 She has seen Dr Yolande Jolly since DC from hospital, with recommendation for dose dense taxol carboplatin, with possible interval debulking depending on response after ~ 3 cycles.    Review of systems as above, also: No SOB, no abdominal pain. Remainder of 10 point Review of  Systems negative.  Objective:  Vital signs in last 24 hours:  BP 130/63  Pulse 92  Temp(Src) 98.1 F (36.7 C) (Oral)  Resp 18  Ht 5\' 1"  (1.549 m)  Wt 139 lb 14.4 oz (63.458 kg)  BMI 26.45 kg/m2  Alert, oriented and appropriate. Ambulatory without assistance. Looks somewhat chronically ill as at my first visit, very pleasant.   HEENT:PERRL, sclerae not icteric. Oral mucosa moist without lesions, posterior pharynx clear.  Lymphatics no cervical,suraclavicular adenopathy Resp: clear to auscultation bilaterally and normal percussion bilaterally Cardio: regular rate and rhythm. No gallop. GI: soft, nontender, not distended, some BS, ileostomy Extremities: without pitting edema, cords, tenderness Neuro:  nonfocal Skin without rash, ecchymosis, petechiae Scars from previous PACs.  Lab Results:  Results for orders placed in visit on 01/29/13  CBC WITH DIFFERENTIAL      Result Value Range   WBC 9.8  3.9 - 10.3 10e3/uL   NEUT# 7.4 (*) 1.5 - 6.5 10e3/uL   HGB 12.1  11.6 - 15.9 g/dL   HCT 40.9  81.1 - 91.4 %   Platelets 365  145 - 400 10e3/uL   MCV 89.1  79.5 - 101.0 fL   MCH 30.2  25.1 - 34.0 pg   MCHC 33.8  31.5 - 36.0 g/dL   RBC 7.82  9.56 - 2.13 10e6/uL   RDW 14.4  11.2 - 14.5 %   lymph# 1.2  0.9 - 3.3 10e3/uL   MONO# 0.8  0.1 - 0.9 10e3/uL   Eosinophils Absolute 0.3  0.0 - 0.5 10e3/uL   Basophils Absolute 0.1  0.0 - 0.1 10e3/uL   NEUT% 75.5  38.4 - 76.8 %   LYMPH% 12.0 (*) 14.0 - 49.7 %   MONO%  8.3  0.0 - 14.0 %   EOS% 3.2  0.0 - 7.0 %   BASO% 1.0  0.0 - 2.0 %  COMPREHENSIVE METABOLIC PANEL (CC13)      Result Value Range   Sodium 137  136 - 145 mEq/L   Potassium 4.5  3.5 - 5.1 mEq/L   Chloride 100  98 - 109 mEq/L   CO2 28  22 - 29 mEq/L   Glucose 104  70 - 140 mg/dl   BUN 8.2  7.0 - 91.4 mg/dL   Creatinine 0.7  0.6 - 1.1 mg/dL   Total Bilirubin 7.82  0.20 - 1.20 mg/dL   Alkaline Phosphatase 73  40 - 150 U/L   AST 15  5 - 34 U/L   ALT 9  0 - 55 U/L   Total  Protein 7.2  6.4 - 8.3 g/dL   Albumin 3.0 (*) 3.5 - 5.0 g/dL   Calcium 9.8  8.4 - 95.6 mg/dL    Will draw iron studies from [facility] in near future.  Studies/Results:  No results found.  Medications: I have reviewed the patient's current medications. She has obtained decadron, zofran, ativan, EMLA. She has been given written and oral instructions for decadron 20 mg 12 hrs prior to taxol.  Patient has had all questions answered to her satisfaction and is in agreement with [facility] today and starting chemotherapy on 01-31-13.  Assessment/Plan: 1.Newly diagnosed serous gyn carcinoma: begin neoadjuvant dose dense taxol carboplatin day 1 cycle 1 on 01-31-13, weekly treatment. I will see her next on 02-05-13 or sooner if needed. 2.Ulcerative colitis with total colectomy 1990, with ileostomy  3.Right ureteral obstruction from pelvic mass, with stent placed by Dr Retta Diones [phone], very difficult procedure  4. Normocytic anemia: will check iron studies and B12 with labs from [facility]  5.difficult peripheral venous access: PAC by IR to be placed today 6.post cholecystectomy  7.hx HTN. I have requested that she hold antihypertensive if BP < ~ 120 to 130 systolic  8.rash to PCN  9.multiple blood transfusions for ulcerative colitis problems previously   Discussed with RN and NP as performance status is compromised somewhat by other medical situation also and I am concerned that she may have a more difficult time with chemotherapy. NP will check on her by phone on 9-12 in addition to usual follow up chemo call on 9-11.   Keyan Folson P, MD   01/29/2013, 9:00 AM

## 2013-01-29 NOTE — Patient Instructions (Signed)
Decadron (dexamethasone, steroid) 4mg  tablets, five tablets with food 12 hours before chemo. Your first chemo will be on 01-31-13 @ 1:30, so you need to eat a little and take five decadron tablets ~ 1:30 AM on 01-31-13   EMLA or Ellamax numbing cream to portacath ~ to an hour before treatment   Call at any time if questions or concerns  [phone]

## 2013-01-29 NOTE — Telephone Encounter (Signed)
Per staff message and POF I have scheduled appts.  JMW  

## 2013-01-31 ENCOUNTER — Other Ambulatory Visit (HOSPITAL_BASED_OUTPATIENT_CLINIC_OR_DEPARTMENT_OTHER): Payer: BC Managed Care – PPO | Admitting: Lab

## 2013-01-31 ENCOUNTER — Other Ambulatory Visit: Payer: BC Managed Care – PPO | Admitting: *Deleted

## 2013-01-31 ENCOUNTER — Ambulatory Visit (HOSPITAL_BASED_OUTPATIENT_CLINIC_OR_DEPARTMENT_OTHER): Payer: BC Managed Care – PPO

## 2013-01-31 VITALS — BP 116/73 | HR 102 | Temp 97.9°F | Resp 18

## 2013-01-31 DIAGNOSIS — C772 Secondary and unspecified malignant neoplasm of intra-abdominal lymph nodes: Secondary | ICD-10-CM

## 2013-01-31 DIAGNOSIS — C801 Malignant (primary) neoplasm, unspecified: Secondary | ICD-10-CM

## 2013-01-31 DIAGNOSIS — Z5111 Encounter for antineoplastic chemotherapy: Secondary | ICD-10-CM

## 2013-01-31 LAB — CBC WITH DIFFERENTIAL/PLATELET
Basophils Absolute: 0 10*3/uL (ref 0.0–0.1)
Eosinophils Absolute: 0 10*3/uL (ref 0.0–0.5)
HGB: 12.7 g/dL (ref 11.6–15.9)
MCV: 89.7 fL (ref 79.5–101.0)
MONO#: 0.1 10*3/uL (ref 0.1–0.9)
MONO%: 0.9 % (ref 0.0–14.0)
NEUT#: 9 10*3/uL — ABNORMAL HIGH (ref 1.5–6.5)
RDW: 14.2 % (ref 11.2–14.5)
WBC: 9.7 10*3/uL (ref 3.9–10.3)
lymph#: 0.6 10*3/uL — ABNORMAL LOW (ref 0.9–3.3)

## 2013-01-31 MED ORDER — HEPARIN SOD (PORK) LOCK FLUSH 100 UNIT/ML IV SOLN
500.0000 [IU] | Freq: Once | INTRAVENOUS | Status: AC | PRN
  Administered 2013-01-31: 500 [IU]
  Filled 2013-01-31: qty 5

## 2013-01-31 MED ORDER — SODIUM CHLORIDE 0.9 % IV SOLN
680.0000 mg | Freq: Once | INTRAVENOUS | Status: AC
  Administered 2013-01-31: 680 mg via INTRAVENOUS
  Filled 2013-01-31: qty 68

## 2013-01-31 MED ORDER — SODIUM CHLORIDE 0.9 % IV SOLN
Freq: Once | INTRAVENOUS | Status: AC
  Administered 2013-01-31: 13:00:00 via INTRAVENOUS

## 2013-01-31 MED ORDER — LORAZEPAM 2 MG/ML IJ SOLN
INTRAMUSCULAR | Status: AC
  Filled 2013-01-31: qty 1

## 2013-01-31 MED ORDER — DIPHENHYDRAMINE HCL 50 MG/ML IJ SOLN
50.0000 mg | Freq: Once | INTRAMUSCULAR | Status: AC
  Administered 2013-01-31: 50 mg via INTRAVENOUS

## 2013-01-31 MED ORDER — ONDANSETRON 16 MG/50ML IVPB (CHCC)
INTRAVENOUS | Status: AC
  Administered 2013-01-31: 16 mg via INTRAVENOUS
  Filled 2013-01-31: qty 16

## 2013-01-31 MED ORDER — ONDANSETRON 16 MG/50ML IVPB (CHCC)
16.0000 mg | Freq: Once | INTRAVENOUS | Status: AC
  Administered 2013-01-31: 16 mg via INTRAVENOUS

## 2013-01-31 MED ORDER — DIPHENHYDRAMINE HCL 50 MG/ML IJ SOLN
INTRAMUSCULAR | Status: AC
  Administered 2013-01-31: 50 mg via INTRAVENOUS
  Filled 2013-01-31: qty 1

## 2013-01-31 MED ORDER — LORAZEPAM 2 MG/ML IJ SOLN: 0.5000 mg | Freq: Once | INTRAMUSCULAR | Status: DC

## 2013-01-31 MED ORDER — FAMOTIDINE IN NACL 20-0.9 MG/50ML-% IV SOLN
20.0000 mg | Freq: Once | INTRAVENOUS | Status: AC
  Administered 2013-01-31: 20 mg via INTRAVENOUS

## 2013-01-31 MED ORDER — DEXAMETHASONE SODIUM PHOSPHATE 20 MG/5ML IJ SOLN
20.0000 mg | Freq: Once | INTRAMUSCULAR | Status: AC
  Administered 2013-01-31: 20 mg via INTRAVENOUS

## 2013-01-31 MED ORDER — SODIUM CHLORIDE 0.9 % IJ SOLN
10.0000 mL | INTRAMUSCULAR | Status: DC | PRN
  Administered 2013-01-31: 10 mL
  Filled 2013-01-31: qty 10

## 2013-01-31 MED ORDER — PACLITAXEL CHEMO INJECTION 300 MG/50ML
80.0000 mg/m2 | Freq: Once | INTRAVENOUS | Status: AC
  Administered 2013-01-31: 132 mg via INTRAVENOUS
  Filled 2013-01-31: qty 22

## 2013-01-31 MED ORDER — FAMOTIDINE IN NACL 20-0.9 MG/50ML-% IV SOLN
INTRAVENOUS | Status: AC
  Administered 2013-01-31: 20 mg via INTRAVENOUS
  Filled 2013-01-31: qty 50

## 2013-01-31 MED ORDER — DEXAMETHASONE SODIUM PHOSPHATE 20 MG/5ML IJ SOLN
INTRAMUSCULAR | Status: AC
  Administered 2013-01-31: 20 mg via INTRAVENOUS
  Filled 2013-01-31: qty 5

## 2013-01-31 NOTE — Progress Notes (Unsigned)
1425 pt c/o nausea. MD notified VO for Ativan 0.5mg  x1. 1430 pt reports nausea is getting better does not feel she needs the ativan right now. Ativan 0.5mg  held per pt request 1445 pt reports no nausea.

## 2013-01-31 NOTE — Patient Instructions (Addendum)
[facility] Discharge Instructions for Patients Receiving Chemotherapy  Today you received the following chemotherapy agents Taxol/carboplatin  To help prevent nausea and vomiting after your treatment, we encourage you to take your nausea medication as directed If you develop nausea and vomiting that is not controlled by your nausea medication, call the clinic.   BELOW ARE SYMPTOMS THAT SHOULD BE REPORTED IMMEDIATELY:  *FEVER GREATER THAN 100.5 F  *CHILLS WITH OR WITHOUT FEVER  NAUSEA AND VOMITING THAT IS NOT CONTROLLED WITH YOUR NAUSEA MEDICATION  *UNUSUAL SHORTNESS OF BREATH  *UNUSUAL BRUISING OR BLEEDING  TENDERNESS IN MOUTH AND THROAT WITH OR WITHOUT PRESENCE OF ULCERS  *URINARY PROBLEMS  *BOWEL PROBLEMS  UNUSUAL RASH Items with * indicate a potential emergency and should be followed up as soon as possible.  Feel free to call the clinic you have any questions or concerns. The clinic phone number is [phone].    

## 2013-01-31 NOTE — Progress Notes (Signed)
1420 Pt having mild nausea, VO from Dr. Raymond Gurney 0.5mg  x 1. 1430 prior to starting Taxol, pt declined Ativan states " it's getting better, I dont really think I need it now." Ativan 0.5mg  held per pt request.

## 2013-02-02 ENCOUNTER — Telehealth: Payer: Self-pay | Admitting: Gynecologic Oncology

## 2013-02-02 ENCOUNTER — Other Ambulatory Visit: Payer: Self-pay | Admitting: Certified Registered Nurse Anesthetist

## 2013-02-02 NOTE — Telephone Encounter (Signed)
Called to check on patient's current status.  Stating that she is "feeling pretty good except for being a little tired."  No nausea or emesis reported.  No concerns voiced.  Instructed to call for any needs or concerns.

## 2013-02-04 ENCOUNTER — Other Ambulatory Visit: Payer: Self-pay | Admitting: Oncology

## 2013-02-05 ENCOUNTER — Ambulatory Visit (HOSPITAL_BASED_OUTPATIENT_CLINIC_OR_DEPARTMENT_OTHER): Payer: BC Managed Care – PPO | Admitting: Oncology

## 2013-02-05 ENCOUNTER — Telehealth: Payer: Self-pay | Admitting: *Deleted

## 2013-02-05 ENCOUNTER — Other Ambulatory Visit (HOSPITAL_BASED_OUTPATIENT_CLINIC_OR_DEPARTMENT_OTHER): Payer: BC Managed Care – PPO | Admitting: Lab

## 2013-02-05 ENCOUNTER — Telehealth: Payer: Self-pay | Admitting: Oncology

## 2013-02-05 VITALS — BP 124/78 | HR 107 | Temp 98.6°F | Resp 20 | Ht 61.0 in | Wt 136.1 lb

## 2013-02-05 DIAGNOSIS — C772 Secondary and unspecified malignant neoplasm of intra-abdominal lymph nodes: Secondary | ICD-10-CM

## 2013-02-05 DIAGNOSIS — C801 Malignant (primary) neoplasm, unspecified: Secondary | ICD-10-CM

## 2013-02-05 LAB — CBC WITH DIFFERENTIAL/PLATELET
BASO%: 0.7 % (ref 0.0–2.0)
EOS%: 2.4 % (ref 0.0–7.0)
LYMPH%: 14 % (ref 14.0–49.7)
MCHC: 33.9 g/dL (ref 31.5–36.0)
MCV: 88.7 fL (ref 79.5–101.0)
MONO%: 1.3 % (ref 0.0–14.0)
Platelets: 309 10*3/uL (ref 145–400)
RBC: 4.18 10*6/uL (ref 3.70–5.45)
RDW: 14.4 % (ref 11.2–14.5)

## 2013-02-05 NOTE — Telephone Encounter (Signed)
, °

## 2013-02-05 NOTE — Progress Notes (Signed)
OFFICE PROGRESS NOTE   02/05/2013   Physicians:ClarkePearson, Reuel Boom; Buckner Malta, Duane Lope, Erie Noe; Dahlstedt, [name]   INTERVAL HISTORY:  Patient is seen, alone for visit, in continuing attention to recently diagnosed gyn carcinoma for which she is receiving neoadjuvant chemotherapy, day 1 cycle 1 dose dense taxol carboplatin given on 01-31-13. She had taxol aches in legs beginning day 3, which were not severe and have resolved. She had sinus HA on day 4 , typical for her with change in weather.She was very tired and could not tolerate food on day 5, was still able to drink liquids including gatorade and zofran was helpful (no HA with zofran). Swelling in RLE continues to improve and she has minimal awareness of the right JJ stent. PAC was placed by IR on 01-29-13, functioned well for chemo.  ONCOLOGIC HISTORY Patient has history of ulcerative colitis for which she had total colectomy with ileostomy in 1990, and subsequently difficult gyn exams. She had been generally stable until contracting norovirus in May 2014, with prolonged illness and weight loss then. She had yearly PE and first gyn exam by Dr Pennie Rushing in June 2014. In July 2014 she developed RLE swelling and pain, seen by PCP Dr Laury Axon with venous dopplers reportedly negative, however those symptoms persisted. She developed nausea, vomiting and temperature 101 by 8-16- 2014, with abdominal US 01-10-13 which showed mild right hydronephrosis. She was then admitted to [facility] from 8-20 thru 01-17-13; with evaluation including CT AP x2; consultations by Drs Retta Diones, Truett Perna, Cornett and ClarkePearson;right ureteral stent placement; and US biopsy of left inguinal lymph node. 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 She has seen Dr Yolande Jolly since DC from hospital, with recommendation for dose dense taxol carboplatin, with possible interval  debulking depending on response after ~ 3 cycles. Cycle 1 day 1 dose dense taxol carboplatin was given on 01-31-13.   Review of systems as above, also: Ileostomy functioned without change after chemo. No peripheral neuropathy symptoms. No overt bleeding. No SOB in exam room. Tolerated decadron 20 mg 12 hrs prior to taxol without problems, was able to sleep.  Remainder of 10 point Review of Systems negative.  Objective:  Vital signs in last 24 hours:  BP 124/78  Pulse 107  Temp(Src) 98.6 F (37 C) (Oral)  Resp 20  Ht 5\' 1"  (1.549 m)  Wt 136 lb 1.6 oz (61.735 kg)  BMI 25.73 kg/m2  Alert, oriented and appropriate. Ambulatory more quickly and easily than at last visit.  No alopecia  HEENT:PERRL, sclerae not icteric. Oral mucosa moist without lesions, posterior pharynx clear.  Neck supple. No JVD.  Lymphatics no cervical,suraclavicular, axillary or inguinal adenopathy Resp: clear to auscultation bilaterally and normal percussion bilaterally Cardio: regular rate and rhythm. No gallop. GI: soft, nontender, not distended, no mass or organomegaly. Ileostomy not remarkable. Normal BS Extremities: without pitting edema, cords, tenderness on left. RLE has 1+ pitting edema 2/3 up calf without cords or tenderness, slightly improved from last exam. Neuro: no peripheral neuropathy. Otherwise nonfocal Skin without rash, ecchymosis, petechiae Portacath-without erythema or tenderness, minimal resolving ecchymosis from placement, good position  Lab Results:  Results for orders placed in visit on 02/05/13  CBC WITH DIFFERENTIAL      Result Value Range   WBC 7.5  3.9 - 10.3 10e3/uL   NEUT# 6.1  1.5 - 6.5 10e3/uL   HGB 12.6  11.6 - 15.9 g/dL   HCT 40.9  81.1 - 91.4 %  Platelets 309  145 - 400 10e3/uL   MCV 88.7  79.5 - 101.0 fL   MCH 30.1  25.1 - 34.0 pg   MCHC 33.9  31.5 - 36.0 g/dL   RBC 1.61  0.96 - 0.45 10e6/uL   RDW 14.4  11.2 - 14.5 %   lymph# 1.0  0.9 - 3.3 10e3/uL   MONO# 0.1  0.1  - 0.9 10e3/uL   Eosinophils Absolute 0.2  0.0 - 0.5 10e3/uL   Basophils Absolute 0.1  0.0 - 0.1 10e3/uL   NEUT% 81.6 (*) 38.4 - 76.8 %   LYMPH% 14.0  14.0 - 49.7 %   MONO% 1.3  0.0 - 14.0 %   EOS% 2.4  0.0 - 7.0 %   BASO% 0.7  0.0 - 2.0 %    Last K+ 4.5 on 01-29-13  Studies/Results:  No results found.  Medications: I have reviewed the patient's current medications. She will continue decadron 20 mg with food 12 hrs prior to taxol and prn zofran, ativan  Assessment/Plan:  1.Newly diagnosed serous gyn carcinoma:  neoadjuvant dose dense taxol carboplatin in process, with day 1 cycle 1 on 01-31-13, due day 8 cycle 1 on 9-17 and day 15 cycle 1 on 02-14-13 as long as ANC >=1.5 and plt >=100k. She will have CBC days of treatment and will have CMET, iron studies and CA 125 from [facility] in infusion on 02-14-13. I will see her again on 9-29, or sooner if needed. 2.Ulcerative colitis: post total colectomy 1990 with ileostomy  3.Right ureteral obstruction from gyn malignancy, with stent placed by Dr Retta Diones [phone], very difficult procedure. She has appointment back to Dr Retta Diones ~ Oct 13. 4. Normocytic anemia: will check iron studies and B12 with labs from [facility] as above 5. PAC in by IR 6.post cholecystectomy  7.hx HTN. I have requested that she hold antihypertensive if BP < ~ 120 to 130 systolic  8.rash to PCN  9.multiple blood transfusions for ulcerative colitis problems previously    Patient is in agreement with plan above.   [name],LENNIS P, MD   02/05/2013, 9:45 AM

## 2013-02-05 NOTE — Telephone Encounter (Signed)
Per staff message and POF I have scheduled appts.  JMW  

## 2013-02-06 ENCOUNTER — Encounter: Payer: Self-pay | Admitting: Oncology

## 2013-02-07 ENCOUNTER — Ambulatory Visit (HOSPITAL_BASED_OUTPATIENT_CLINIC_OR_DEPARTMENT_OTHER): Payer: BC Managed Care – PPO

## 2013-02-07 ENCOUNTER — Other Ambulatory Visit (HOSPITAL_BASED_OUTPATIENT_CLINIC_OR_DEPARTMENT_OTHER): Payer: BC Managed Care – PPO | Admitting: Lab

## 2013-02-07 VITALS — BP 121/76 | HR 90 | Temp 98.4°F | Resp 18

## 2013-02-07 DIAGNOSIS — C801 Malignant (primary) neoplasm, unspecified: Secondary | ICD-10-CM

## 2013-02-07 DIAGNOSIS — C772 Secondary and unspecified malignant neoplasm of intra-abdominal lymph nodes: Secondary | ICD-10-CM

## 2013-02-07 DIAGNOSIS — Z5111 Encounter for antineoplastic chemotherapy: Secondary | ICD-10-CM

## 2013-02-07 LAB — CBC WITH DIFFERENTIAL/PLATELET
Basophils Absolute: 0 10*3/uL (ref 0.0–0.1)
Eosinophils Absolute: 0 10*3/uL (ref 0.0–0.5)
HGB: 11.8 g/dL (ref 11.6–15.9)
MCV: 88.2 fL (ref 79.5–101.0)
MONO#: 0 10*3/uL — ABNORMAL LOW (ref 0.1–0.9)
MONO%: 0.7 % (ref 0.0–14.0)
NEUT#: 5.3 10*3/uL (ref 1.5–6.5)
RBC: 3.98 10*6/uL (ref 3.70–5.45)
RDW: 14.2 % (ref 11.2–14.5)
WBC: 5.8 10*3/uL (ref 3.9–10.3)
nRBC: 0 % (ref 0–0)

## 2013-02-07 MED ORDER — SODIUM CHLORIDE 0.9 % IJ SOLN
10.0000 mL | INTRAMUSCULAR | Status: DC | PRN
  Administered 2013-02-07: 10 mL
  Filled 2013-02-07: qty 10

## 2013-02-07 MED ORDER — FAMOTIDINE IN NACL 20-0.9 MG/50ML-% IV SOLN
INTRAVENOUS | Status: AC
  Filled 2013-02-07: qty 50

## 2013-02-07 MED ORDER — DEXAMETHASONE SODIUM PHOSPHATE 20 MG/5ML IJ SOLN
INTRAMUSCULAR | Status: AC
  Filled 2013-02-07: qty 5

## 2013-02-07 MED ORDER — DIPHENHYDRAMINE HCL 50 MG/ML IJ SOLN
INTRAMUSCULAR | Status: AC
  Filled 2013-02-07: qty 1

## 2013-02-07 MED ORDER — PACLITAXEL CHEMO INJECTION 300 MG/50ML
80.0000 mg/m2 | Freq: Once | INTRAVENOUS | Status: AC
  Administered 2013-02-07: 132 mg via INTRAVENOUS
  Filled 2013-02-07: qty 22

## 2013-02-07 MED ORDER — ONDANSETRON 8 MG/50ML IVPB (CHCC)
8.0000 mg | Freq: Once | INTRAVENOUS | Status: AC
  Administered 2013-02-07: 8 mg via INTRAVENOUS

## 2013-02-07 MED ORDER — DIPHENHYDRAMINE HCL 50 MG/ML IJ SOLN
50.0000 mg | Freq: Once | INTRAMUSCULAR | Status: AC
  Administered 2013-02-07: 50 mg via INTRAVENOUS

## 2013-02-07 MED ORDER — ONDANSETRON 8 MG/NS 50 ML IVPB
INTRAVENOUS | Status: AC
  Filled 2013-02-07: qty 8

## 2013-02-07 MED ORDER — HEPARIN SOD (PORK) LOCK FLUSH 100 UNIT/ML IV SOLN
500.0000 [IU] | Freq: Once | INTRAVENOUS | Status: AC | PRN
  Administered 2013-02-07: 500 [IU]
  Filled 2013-02-07: qty 5

## 2013-02-07 MED ORDER — FAMOTIDINE IN NACL 20-0.9 MG/50ML-% IV SOLN
20.0000 mg | Freq: Once | INTRAVENOUS | Status: AC
  Administered 2013-02-07: 20 mg via INTRAVENOUS

## 2013-02-07 MED ORDER — DEXAMETHASONE SODIUM PHOSPHATE 20 MG/5ML IJ SOLN
20.0000 mg | Freq: Once | INTRAMUSCULAR | Status: AC
  Administered 2013-02-07: 20 mg via INTRAVENOUS

## 2013-02-07 MED ORDER — SODIUM CHLORIDE 0.9 % IV SOLN
Freq: Once | INTRAVENOUS | Status: AC
  Administered 2013-02-07: 12:00:00 via INTRAVENOUS

## 2013-02-07 NOTE — Patient Instructions (Addendum)
[facility] Discharge Instructions for Patients Receiving Chemotherapy  Today you received the following chemotherapy agents: Taxol.  To help prevent nausea and vomiting after your treatment, we encourage you to take your nausea medication as prescribed.   If you develop nausea and vomiting that is not controlled by your nausea medication, call the clinic.   BELOW ARE SYMPTOMS THAT SHOULD BE REPORTED IMMEDIATELY:  *FEVER GREATER THAN 100.5 F  *CHILLS WITH OR WITHOUT FEVER  NAUSEA AND VOMITING THAT IS NOT CONTROLLED WITH YOUR NAUSEA MEDICATION  *UNUSUAL SHORTNESS OF BREATH  *UNUSUAL BRUISING OR BLEEDING  TENDERNESS IN MOUTH AND THROAT WITH OR WITHOUT PRESENCE OF ULCERS  *URINARY PROBLEMS  *BOWEL PROBLEMS  UNUSUAL RASH Items with * indicate a potential emergency and should be followed up as soon as possible.  Feel free to call the clinic you have any questions or concerns. The clinic phone number is [phone].    

## 2013-02-14 ENCOUNTER — Other Ambulatory Visit (HOSPITAL_BASED_OUTPATIENT_CLINIC_OR_DEPARTMENT_OTHER): Payer: BC Managed Care – PPO

## 2013-02-14 ENCOUNTER — Ambulatory Visit: Payer: BC Managed Care – PPO

## 2013-02-14 ENCOUNTER — Encounter: Payer: Self-pay | Admitting: Oncology

## 2013-02-14 ENCOUNTER — Ambulatory Visit (HOSPITAL_BASED_OUTPATIENT_CLINIC_OR_DEPARTMENT_OTHER): Payer: BC Managed Care – PPO | Admitting: Oncology

## 2013-02-14 VITALS — Wt 138.7 lb

## 2013-02-14 DIAGNOSIS — C801 Malignant (primary) neoplasm, unspecified: Secondary | ICD-10-CM

## 2013-02-14 DIAGNOSIS — D649 Anemia, unspecified: Secondary | ICD-10-CM

## 2013-02-14 DIAGNOSIS — D500 Iron deficiency anemia secondary to blood loss (chronic): Secondary | ICD-10-CM

## 2013-02-14 DIAGNOSIS — C772 Secondary and unspecified malignant neoplasm of intra-abdominal lymph nodes: Secondary | ICD-10-CM

## 2013-02-14 DIAGNOSIS — D709 Neutropenia, unspecified: Secondary | ICD-10-CM

## 2013-02-14 LAB — CBC WITH DIFFERENTIAL/PLATELET
BASO%: 0 % (ref 0.0–2.0)
Basophils Absolute: 0 10*3/uL (ref 0.0–0.1)
EOS%: 0 % (ref 0.0–7.0)
Eosinophils Absolute: 0 10*3/uL (ref 0.0–0.5)
HCT: 30.5 % — ABNORMAL LOW (ref 34.8–46.6)
HGB: 10.3 g/dL — ABNORMAL LOW (ref 11.6–15.9)
LYMPH%: 16.1 % (ref 14.0–49.7)
MCH: 28.9 pg (ref 25.1–34.0)
MCHC: 33.8 g/dL (ref 31.5–36.0)
MCV: 85.4 fL (ref 79.5–101.0)
MONO#: 0.1 10*3/uL (ref 0.1–0.9)
MONO%: 3 % (ref 0.0–14.0)
NEUT#: 1.4 10*3/uL — ABNORMAL LOW (ref 1.5–6.5)
NEUT%: 80.9 % — ABNORMAL HIGH (ref 38.4–76.8)
Platelets: 49 10*3/uL — ABNORMAL LOW (ref 145–400)
RBC: 3.57 10*6/uL — ABNORMAL LOW (ref 3.70–5.45)
RDW: 13.1 % (ref 11.2–14.5)
WBC: 1.7 10*3/uL — ABNORMAL LOW (ref 3.9–10.3)
lymph#: 0.3 10*3/uL — ABNORMAL LOW (ref 0.9–3.3)

## 2013-02-14 MED ORDER — FILGRASTIM 300 MCG/0.5ML IJ SOLN
300.0000 ug | Freq: Once | INTRAMUSCULAR | Status: AC
  Administered 2013-02-14: 300 ug via SUBCUTANEOUS
  Filled 2013-02-14: qty 0.5

## 2013-02-14 NOTE — Progress Notes (Signed)
OFFICE PROGRESS NOTE   02/14/2013   Physicians:ClarkePearson, Reuel Boom; Buckner Malta, Duane Lope, Erie Noe; Dahlstedt, [name]   INTERVAL HISTORY:  Patient is seen for an unscheduled visit due to low counts today, requiring planned day 15 cycle 1 dose dense taxol carboplatin be held. Patient was more fatigued over this past weekend, with temp briefly up to 100 on 02-11-13, then back to normal without other intervention. She had no localizing symptoms of infection.She had epistaxis from right nares yesterday, lasted < and resolved without intervention She has had no other bleeding. She has not had much nausea, has been able to take po's including fluids, ileostomy is functioning as usual, no significant abdominal or pelvic discomfort. PAC has functioned well since it was placed for this chemotherapy.  ONCOLOGIC HISTORY Patient has history of ulcerative colitis for which she had total colectomy with ileostomy in 1990, and subsequently difficult gyn exams. She had been generally stable until contracting norovirus in May 2014, with prolonged illness and weight loss then. She had yearly PE and first gyn exam by Dr Pennie Rushing in June 2014. In July 2014 she developed RLE swelling and pain, seen by PCP Dr Laury Axon with venous dopplers reportedly negative, however those symptoms persisted. She developed nausea, vomiting and temperature 101 by 8-16- 2014, with abdominal US 01-10-13 which showed mild right hydronephrosis. She was then admitted to [facility] from 8-20 thru 01-17-13; with evaluation including CT AP x2; consultations by Drs Retta Diones, Truett Perna, Cornett and ClarkePearson;right ureteral stent placement; and US biopsy of left inguinal lymph node. 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 She has seen Dr Yolande Jolly since DC from hospital, with recommendation for dose dense taxol carboplatin, with possible interval  debulking depending on response after ~ 3 cycles. Cycle 1 day 1 dose dense taxol carboplatin was given on 01-31-13.   Review of systems as above, also: Not SOB with activity at office today. No new or different pain. No LE swelling. She has not been aware of increased hematuria, stent in. Remainder of 10 point Review of Systems negative/ unchanged.  Objective:  Vital signs in last 24 hours:  Wt 138 lb 11.2 oz (62.914 kg)  BMI 26.22 kg/m2 HR 113, regular  Resp 20, Temp 98.4 oral, BP 123/78  Alert, oriented and appropriate. Ambulatory without difficulty.  Hair is thinning. Looks somewhat tired and pale, but talkative and in good spirits.  HEENT:PERRL, sclerae not icteric. Oral mucosa moist without lesions, posterior pharynx clear. Nasal septum erythematous on right but no active bleeding Neck supple. No JVD.  Lymphatics:no cervical,suraclavicular adenopathy Resp: clear to auscultation bilaterally and normal percussion bilaterally Cardio: regular rate and rhythm. No gallop. GI: soft, nontender, not distended, no mass or organomegaly. Normally active bowel sounds. Tan liquid with some particulates in ileostomy bag. Musculoskeletal/ Extremities: without pitting edema, cords, tenderness Neuro: no peripheral neuropathy. Otherwise nonfocal Skin without new ecchymosis, petechiae. Skin irritation corresponding to edges of previous occlusive dressing, several cm away from the access site.  Portacath-without erythema or tenderness, ecchymosis resolving.  Lab Results:  CBC today with WBC 1.7, ANC 1.4, Hgb 10.3 and plt 49k Chemistries, ca125, iron, B12 not done today as PAC not accessed.   Studies/Results:  No results found.  Medications: I have reviewed the patient's current medications. She is given neupogen 300 today and understands that she can use claritin if aches.   DISCUSSION We have discussed CBC as above, with ANC and particularly platelets too low for  day 15 cycle 1 taxol today.  Neupogen given and she knows to avoid any ASA/ NSAIDs and to call if any significant bleeding. She will use saline nose spray every 1-3 hrs while awake and have afrin on hand in case significant epistaxis. We will omit this treatment and proceed with day 1 cycle 2 next week, with decrease in carboplatin, as long as ANC >=1.5 and plt >=100k then. She will have the other labs drawn from [facility] next week.  Assessment/Plan:  1.Newly diagnosed serous gyn carcinoma: neoadjuvant dose dense taxol carboplatin in process,  day 15 cycle 1 held today due to platelets 49k with ANC 1.4. Neupogen x1 today; neutropenic considerations discussed including calling for temp >=100.5 or symptoms of infection. Discussed bleeding as above. She will have repeat CBC and CMET, iron studies and CA 125 when I will see her again on 9-29. She will be due to start cycle 2 on 02-21-13. 2.Ulcerative colitis: post total colectomy 1990 with ileostomy  3.Right ureteral obstruction from gyn malignancy, with stent placed by Dr Retta Diones [phone], very difficult procedure. She has appointment back to Dr Retta Diones ~ Oct 13.  4. Normocytic anemia: will check iron studies and B12 with labs from [facility] as above  5. PAC in by IR  6.post cholecystectomy  7.hx HTN, but with BP lower is holding antihypertensives 8.rash to PCN  9.multiple blood transfusions for ulcerative colitis problems previously    [name],LENNIS P, MD   02/14/2013, 2:00 PM

## 2013-02-14 NOTE — Patient Instructions (Signed)
Call if any significant bleeding prior to next visit 9-29     [phone]  Call if temperature >=100.5 or symptoms of infection. We gave you a neupogen shot for your white blood count today  Saline nose spray every 1-3 hours while awake, to lessen chance of nose bleeds  Afrin nose spray x1 if a nosebleed starts, and call if so

## 2013-02-14 NOTE — Progress Notes (Signed)
Pt did not get treated per Dr. Darrold Span r/t Pltz count being low at 47.

## 2013-02-15 ENCOUNTER — Other Ambulatory Visit: Payer: Self-pay | Admitting: Oncology

## 2013-02-19 ENCOUNTER — Ambulatory Visit: Payer: BC Managed Care – PPO

## 2013-02-19 ENCOUNTER — Telehealth: Payer: Self-pay | Admitting: *Deleted

## 2013-02-19 ENCOUNTER — Other Ambulatory Visit (HOSPITAL_BASED_OUTPATIENT_CLINIC_OR_DEPARTMENT_OTHER): Payer: BC Managed Care – PPO

## 2013-02-19 ENCOUNTER — Ambulatory Visit (HOSPITAL_BASED_OUTPATIENT_CLINIC_OR_DEPARTMENT_OTHER): Payer: BC Managed Care – PPO | Admitting: Oncology

## 2013-02-19 ENCOUNTER — Encounter: Payer: Self-pay | Admitting: Oncology

## 2013-02-19 VITALS — BP 131/79 | HR 94 | Temp 98.3°F | Resp 20 | Ht 61.0 in | Wt 140.5 lb

## 2013-02-19 DIAGNOSIS — D649 Anemia, unspecified: Secondary | ICD-10-CM

## 2013-02-19 DIAGNOSIS — C801 Malignant (primary) neoplasm, unspecified: Secondary | ICD-10-CM

## 2013-02-19 DIAGNOSIS — C772 Secondary and unspecified malignant neoplasm of intra-abdominal lymph nodes: Secondary | ICD-10-CM

## 2013-02-19 DIAGNOSIS — I10 Essential (primary) hypertension: Secondary | ICD-10-CM

## 2013-02-19 DIAGNOSIS — C762 Malignant neoplasm of abdomen: Secondary | ICD-10-CM

## 2013-02-19 LAB — IRON AND TIBC CHCC
Iron: 90 ug/dL (ref 41–142)
TIBC: 226 ug/dL — ABNORMAL LOW (ref 236–444)
UIBC: 136 ug/dL (ref 120–384)

## 2013-02-19 LAB — COMPREHENSIVE METABOLIC PANEL (CC13)
ALT: 10 U/L (ref 0–55)
Alkaline Phosphatase: 95 U/L (ref 40–150)
CO2: 26 mEq/L (ref 22–29)
Calcium: 9.4 mg/dL (ref 8.4–10.4)
Chloride: 103 mEq/L (ref 98–109)
Creatinine: 0.6 mg/dL (ref 0.6–1.1)
Sodium: 137 mEq/L (ref 136–145)
Total Bilirubin: 0.24 mg/dL (ref 0.20–1.20)

## 2013-02-19 LAB — CBC WITH DIFFERENTIAL/PLATELET
BASO%: 0.6 % (ref 0.0–2.0)
EOS%: 1.4 % (ref 0.0–7.0)
HCT: 29.3 % — ABNORMAL LOW (ref 34.8–46.6)
HGB: 10 g/dL — ABNORMAL LOW (ref 11.6–15.9)
LYMPH%: 28.5 % (ref 14.0–49.7)
MCH: 30 pg (ref 25.1–34.0)
MCHC: 34 g/dL (ref 31.5–36.0)
MCV: 88.2 fL (ref 79.5–101.0)
MONO#: 0.4 10*3/uL (ref 0.1–0.9)
MONO%: 12.5 % (ref 0.0–14.0)
NEUT%: 57 % (ref 38.4–76.8)
Platelets: 102 10*3/uL — ABNORMAL LOW (ref 145–400)
RBC: 3.32 10*6/uL — ABNORMAL LOW (ref 3.70–5.45)
RDW: 14.2 % (ref 11.2–14.5)
WBC: 3.6 10*3/uL — ABNORMAL LOW (ref 3.9–10.3)

## 2013-02-19 LAB — CA 125: CA 125: 4131.7 U/mL — ABNORMAL HIGH (ref 0.0–30.2)

## 2013-02-19 MED ORDER — ACYCLOVIR 200 MG PO CAPS: 200.0000 mg | ORAL_CAPSULE | Freq: Three times a day (TID) | ORAL | Status: DC

## 2013-02-19 MED ORDER — SODIUM CHLORIDE 0.9 % IJ SOLN
10.0000 mL | INTRAMUSCULAR | Status: AC | PRN
  Administered 2013-02-19: 10 mL via INTRAVENOUS
  Filled 2013-02-19: qty 10

## 2013-02-19 MED ORDER — DEXAMETHASONE 4 MG PO TABS: ORAL_TABLET | ORAL | Status: DC

## 2013-02-19 MED ORDER — HEPARIN SOD (PORK) LOCK FLUSH 100 UNIT/ML IV SOLN
500.0000 [IU] | Freq: Once | INTRAVENOUS | Status: AC
  Administered 2013-02-19: 500 [IU] via INTRAVENOUS
  Filled 2013-02-19: qty 5

## 2013-02-19 NOTE — Progress Notes (Signed)
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 Cycle 1 dose dense carboplatin  Taxol began 01-31-13, with day 15 taxol held on 02-14-13 due to platelets 49k. She has had no further epistaxis or other bleeding and has otherwise felt a little better overall for past 2 days. She has needed no medication for pain or nausea. She has developed a fever blister on upper lip. She denies new or different abdominal symptoms, temperature >99 or different voiding symptoms. She has had more swelling RLE without pain. Appetite has been a little better.   ONCOLOGIC HISTORY Patient has history of ulcerative colitis for which she had total colectomy with ileostomy in 1990, and subsequently difficult gyn exams. She had been generally stable until contracting norovirus in May 2014, with prolonged illness and weight loss then. She had yearly PE and first gyn exam by Dr Pennie Rushing in June 2014. In July 2014 she developed RLE swelling and pain, seen by PCP Dr Laury Axon with venous dopplers reportedly negative, however those symptoms persisted. She developed nausea, vomiting and temperature 101 by 8-16- 2014, with abdominal US 01-10-13 which showed mild right hydronephrosis. She was then admitted to [facility] from 8-20 thru 01-17-13; with evaluation including CT AP x2; consultations by Drs Retta Diones, Truett Perna, Cornett and ClarkePearson;right ureteral stent placement; and US biopsy of left inguinal lymph node. Pathology [phone] carcinoma with immunohistochemical stains consistent with serous carcinoma of gyn origin. She has seen Dr Yolande Jolly since DC from hospital, with recommendation for dose dense taxol carboplatin, with possible  interval debulking depending on response after ~ 3 cycles. Cycle 1 day 1 dose dense taxol carboplatin was given on 01-31-13; day 15 cycle 1 was held with platelets 49k.   Review of systems as above, also: Ileostomy functioning as baseline. Losing hair. No peripheral neuropathy. Able to sleep. No intraoral ulcerations (as she had extensively when ulcerative colitis was active, with herpetic lesions on lips also then). No SOB or chest pain, no cough. Remainder of 10 point Review of Systems negative/ unchanged.  Objective:  Vital signs in last 24 hours:  BP 131/79  Pulse 94  Temp(Src) 98.3 F (36.8 C) (Oral)  Resp 20  Ht 5\' 1"  (1.549 m)  Wt 140 lb 8 oz (63.73 kg)  BMI 26.56 kg/m2 Weight is up 2 lbs. Alert, oriented and appropriate. Ambulatory without difficulty.  Hair is thinning.  HEENT:PERRL, sclerae not icteric. Oral mucosa moist without lesions, posterior pharynx clear. Herpetic lesion upper lip on right, mucous membranes somewhat pale. Neck supple. No JVD.  Lymphatics:no cervical,suraclavicular adenopathy Resp: clear to auscultation bilaterally and normal percussion bilaterally Cardio: regular rate and rhythm. No gallop. GI: soft, nontender, not distended, bowel sounds present, ileostomy unchanged Musculoskeletal/ Extremities: tighter swelling RLE without cords, heat, tenderness. No swelling LLE or UE. Neuro: no peripheral neuropathy. Otherwise nonfocal Skin without rash, ecchymosis, petechiae Portacath-without erythema or tenderness  Lab Results:  Results for orders placed in visit on 02/19/13  CBC WITH DIFFERENTIAL      Result Value Range   WBC 3.6 (*) 3.9 - 10.3 10e3/uL   NEUT# 2.0  1.5 - 6.5 10e3/uL   HGB 10.0 (*) 11.6 - 15.9 g/dL   HCT 40.9 (*) 81.1 - 91.4 %   Platelets 102 (*) 145 - 400  10e3/uL   MCV 88.2  79.5 - 101.0 fL   MCH 30.0  25.1 - 34.0 pg   MCHC 34.0  31.5 - 36.0 g/dL   RBC 1.61 (*) 0.96 - 0.45 10e6/uL   RDW 14.2  11.2 - 14.5 %   lymph# 1.0  0.9 - 3.3  10e3/uL   MONO# 0.4  0.1 - 0.9 10e3/uL   Eosinophils Absolute 0.0  0.0 - 0.5 10e3/uL   Basophils Absolute 0.0  0.0 - 0.1 10e3/uL   NEUT% 57.0  38.4 - 76.8 %   LYMPH% 28.5  14.0 - 49.7 %   MONO% 12.5  0.0 - 14.0 %   EOS% 1.4  0.0 - 7.0 %   BASO% 0.6  0.0 - 2.0 %  COMPREHENSIVE METABOLIC PANEL (CC13)      Result Value Range   Sodium 137  136 - 145 mEq/L   Potassium 3.9  3.5 - 5.1 mEq/L   Chloride 103  98 - 109 mEq/L   CO2 26  22 - 29 mEq/L   Glucose 106  70 - 140 mg/dl   BUN 9.3  7.0 - 40.9 mg/dL   Creatinine 0.6  0.6 - 1.1 mg/dL   Total Bilirubin 8.11  0.20 - 1.20 mg/dL   Alkaline Phosphatase 95  40 - 150 U/L   AST 12  5 - 34 U/L   ALT 10  0 - 55 U/L   Total Protein 6.4  6.4 - 8.3 g/dL   Albumin 2.9 (*) 3.5 - 5.0 g/dL   Calcium 9.4  8.4 - 91.4 mg/dL  CA 782      Result Value Range   CA 125 4131.7 (*) 0.0 - 30.2 U/mL  FERRITIN [facility]      Result Value Range   Ferritin 613 (*) 9 - 269 ng/ml  IRON AND TIBC [facility]      Result Value Range   Iron 90  41 - 142 ug/dL   TIBC 956 (*) 213 - 086 ug/dL   UIBC 578  469 - 629 ug/dL   %SAT 40  21 - 57 %  VITAMIN B12      Result Value Range   Vitamin B-12 556  211 - 911 pg/mL     Studies/Results:  No results found. RLE venous doppler reportedly negative in July (not in this EMR)   Medications: I have reviewed the patient's current medications. Will add acyclovir 200 mg tid for the herpetic oral lesion; may be reasonable to have this available to her or continue prophylactic during chemotherapy with history of recurrent oral ulcers. Carboplatin dose decreased for day 1 cycle 2 02-21-13 due to platelet nadir cycle 1. Steroid premed for taxol is decadron 20 mg 12 hrs prior.  Subsequent to patient's visit, I have decided to recommend repeat LE venous doppler. RN/ scheduler to be in touch with patient in this regard.  Assessment/Plan: 1. serous gyn carcinoma: large pelvic mass with right ureteral obstruction, small bowel fistula into the  mass and adenopathy: dose dense carbo taxol to resume with cycle 2 day 1 on 02-21-13, carbo dose decreased.  2.Ulcerative colitis: post total colectomy 1990 with ileostomy  3.Right ureteral obstruction from gyn malignancy, with stent placed by Dr Retta Diones [phone], very difficult procedure. She has appointment back to Dr Retta Diones ~ Oct 13.  4. Normocytic anemia:iron and B12 ok. follow 5. PAC in by IR  6.herpetic lesion lip: acyclovir as above.Hx oral ulcerations related to ulcerative colitis previously 7.hx HTN. I have  requested that she hold antihypertensive if BP < ~ 120 to 130 systolic  8.rash to PCN  9.multiple blood transfusions for ulcerative colitis problems previously    Patient had questions answered to her satisfaction and is in agreement with plan.    Reece Packer, MD   02/19/2013, 8:31 PM

## 2013-02-19 NOTE — Telephone Encounter (Signed)
I have adjusted appts for 10/8 and 10/15

## 2013-02-19 NOTE — Patient Instructions (Addendum)
Take decadron (dexamethasone) five of the 4 mg tablets with food 12 hrs prior to each taxol chemotherapy  We will call in the decadron and acyclovir for fever bisters to [employer]

## 2013-02-20 ENCOUNTER — Encounter (HOSPITAL_COMMUNITY): Payer: BC Managed Care – PPO

## 2013-02-20 ENCOUNTER — Encounter: Payer: Self-pay | Admitting: Medical Oncology

## 2013-02-20 ENCOUNTER — Telehealth: Payer: Self-pay | Admitting: Oncology

## 2013-02-20 ENCOUNTER — Ambulatory Visit (HOSPITAL_COMMUNITY)
Admission: RE | Admit: 2013-02-20 | Discharge: 2013-02-20 | Disposition: A | Discharge status: Home / Self Care | Payer: BC Managed Care – PPO | Source: Ambulatory Visit | Attending: Oncology | Admitting: Oncology

## 2013-02-20 ENCOUNTER — Other Ambulatory Visit (HOSPITAL_COMMUNITY): Payer: Self-pay | Admitting: Oncology

## 2013-02-20 DIAGNOSIS — M7989 Other specified soft tissue disorders: Secondary | ICD-10-CM

## 2013-02-20 NOTE — Progress Notes (Unsigned)
Received a call from vascular labs that pt is negative for DVT. Pt discharged home.

## 2013-02-20 NOTE — Telephone Encounter (Signed)
, °

## 2013-02-20 NOTE — Progress Notes (Signed)
*  Preliminary Results* Right lower extremity venous duplex completed. Right lower extremity is negative for deep vein thrombosis. There is no evidence of right Baker's cyst.  Preliminary results discussed with Zella Ball of Dr.[name]'s office.  02/20/2013 10:43 AM  Gertie Fey, RVT, RDCS, RDMS

## 2013-02-21 ENCOUNTER — Ambulatory Visit (HOSPITAL_BASED_OUTPATIENT_CLINIC_OR_DEPARTMENT_OTHER): Payer: BC Managed Care – PPO

## 2013-02-21 ENCOUNTER — Other Ambulatory Visit: Payer: Self-pay | Admitting: *Deleted

## 2013-02-21 ENCOUNTER — Other Ambulatory Visit (HOSPITAL_BASED_OUTPATIENT_CLINIC_OR_DEPARTMENT_OTHER): Payer: BC Managed Care – PPO | Admitting: Lab

## 2013-02-21 VITALS — BP 146/81 | HR 113 | Temp 97.0°F

## 2013-02-21 DIAGNOSIS — C772 Secondary and unspecified malignant neoplasm of intra-abdominal lymph nodes: Secondary | ICD-10-CM

## 2013-02-21 DIAGNOSIS — C801 Malignant (primary) neoplasm, unspecified: Secondary | ICD-10-CM

## 2013-02-21 DIAGNOSIS — Z5111 Encounter for antineoplastic chemotherapy: Secondary | ICD-10-CM

## 2013-02-21 LAB — CBC WITH DIFFERENTIAL/PLATELET
BASO%: 0.1 % (ref 0.0–2.0)
HCT: 32.4 % — ABNORMAL LOW (ref 34.8–46.6)
HGB: 10.8 g/dL — ABNORMAL LOW (ref 11.6–15.9)
LYMPH%: 7.1 % — ABNORMAL LOW (ref 14.0–49.7)
MCHC: 33.3 g/dL (ref 31.5–36.0)
MCV: 88.8 fL (ref 79.5–101.0)
MONO#: 0.1 10*3/uL (ref 0.1–0.9)
MONO%: 1.2 % (ref 0.0–14.0)
NEUT%: 91.6 % — ABNORMAL HIGH (ref 38.4–76.8)
Platelets: 196 10*3/uL (ref 145–400)
RBC: 3.65 10*6/uL — ABNORMAL LOW (ref 3.70–5.45)
WBC: 6.7 10*3/uL (ref 3.9–10.3)

## 2013-02-21 MED ORDER — SODIUM CHLORIDE 0.9 % IV SOLN
512.0000 mg | Freq: Once | INTRAVENOUS | Status: AC
  Administered 2013-02-21: 510 mg via INTRAVENOUS
  Filled 2013-02-21: qty 51

## 2013-02-21 MED ORDER — FAMOTIDINE IN NACL 20-0.9 MG/50ML-% IV SOLN
20.0000 mg | Freq: Once | INTRAVENOUS | Status: AC
  Administered 2013-02-21: 20 mg via INTRAVENOUS

## 2013-02-21 MED ORDER — FAMOTIDINE IN NACL 20-0.9 MG/50ML-% IV SOLN
INTRAVENOUS | Status: AC
  Filled 2013-02-21: qty 50

## 2013-02-21 MED ORDER — DEXAMETHASONE SODIUM PHOSPHATE 20 MG/5ML IJ SOLN
INTRAMUSCULAR | Status: AC
  Filled 2013-02-21: qty 5

## 2013-02-21 MED ORDER — ONDANSETRON 16 MG/50ML IVPB (CHCC)
INTRAVENOUS | Status: AC
  Filled 2013-02-21: qty 16

## 2013-02-21 MED ORDER — SODIUM CHLORIDE 0.9 % IJ SOLN
10.0000 mL | INTRAMUSCULAR | Status: DC | PRN
  Administered 2013-02-21: 10 mL
  Filled 2013-02-21: qty 10

## 2013-02-21 MED ORDER — HEPARIN SOD (PORK) LOCK FLUSH 100 UNIT/ML IV SOLN
500.0000 [IU] | Freq: Once | INTRAVENOUS | Status: AC | PRN
  Administered 2013-02-21: 500 [IU]
  Filled 2013-02-21: qty 5

## 2013-02-21 MED ORDER — SODIUM CHLORIDE 0.9 % IV SOLN
Freq: Once | INTRAVENOUS | Status: AC
  Administered 2013-02-21: 09:00:00 via INTRAVENOUS

## 2013-02-21 MED ORDER — DIPHENHYDRAMINE HCL 50 MG/ML IJ SOLN
INTRAMUSCULAR | Status: AC
  Filled 2013-02-21: qty 1

## 2013-02-21 MED ORDER — DIPHENHYDRAMINE HCL 50 MG/ML IJ SOLN
50.0000 mg | Freq: Once | INTRAMUSCULAR | Status: AC
  Administered 2013-02-21: 50 mg via INTRAVENOUS

## 2013-02-21 MED ORDER — PACLITAXEL CHEMO INJECTION 300 MG/50ML
80.0000 mg/m2 | Freq: Once | INTRAVENOUS | Status: AC
  Administered 2013-02-21: 132 mg via INTRAVENOUS
  Filled 2013-02-21: qty 22

## 2013-02-21 MED ORDER — DEXAMETHASONE SODIUM PHOSPHATE 20 MG/5ML IJ SOLN
20.0000 mg | Freq: Once | INTRAMUSCULAR | Status: AC
  Administered 2013-02-21: 20 mg via INTRAVENOUS

## 2013-02-21 MED ORDER — ONDANSETRON 16 MG/50ML IVPB (CHCC)
16.0000 mg | Freq: Once | INTRAVENOUS | Status: AC
  Administered 2013-02-21: 16 mg via INTRAVENOUS

## 2013-02-21 NOTE — Patient Instructions (Addendum)
[facility] Discharge Instructions for Patients Receiving Chemotherapy  Today you received the following chemotherapy agents Taxol/Carboplatin To help prevent nausea and vomiting after your treatment, we encourage you to take your nausea medication as prescribed.  If you develop nausea and vomiting that is not controlled by your nausea medication, call the clinic.   BELOW ARE SYMPTOMS THAT SHOULD BE REPORTED IMMEDIATELY:  *FEVER GREATER THAN 100.5 F  *CHILLS WITH OR WITHOUT FEVER  NAUSEA AND VOMITING THAT IS NOT CONTROLLED WITH YOUR NAUSEA MEDICATION  *UNUSUAL SHORTNESS OF BREATH  *UNUSUAL BRUISING OR BLEEDING  TENDERNESS IN MOUTH AND THROAT WITH OR WITHOUT PRESENCE OF ULCERS  *URINARY PROBLEMS  *BOWEL PROBLEMS  UNUSUAL RASH Items with * indicate a potential emergency and should be followed up as soon as possible.  Feel free to call the clinic you have any questions or concerns. The clinic phone number is [phone].    

## 2013-02-28 ENCOUNTER — Ambulatory Visit (HOSPITAL_BASED_OUTPATIENT_CLINIC_OR_DEPARTMENT_OTHER): Payer: BC Managed Care – PPO | Admitting: Oncology

## 2013-02-28 ENCOUNTER — Ambulatory Visit (HOSPITAL_BASED_OUTPATIENT_CLINIC_OR_DEPARTMENT_OTHER): Payer: BC Managed Care – PPO | Admitting: Lab

## 2013-02-28 ENCOUNTER — Encounter: Payer: Self-pay | Admitting: Oncology

## 2013-02-28 ENCOUNTER — Telehealth: Payer: Self-pay | Admitting: *Deleted

## 2013-02-28 ENCOUNTER — Telehealth: Payer: Self-pay | Admitting: Oncology

## 2013-02-28 ENCOUNTER — Ambulatory Visit (HOSPITAL_BASED_OUTPATIENT_CLINIC_OR_DEPARTMENT_OTHER): Payer: BC Managed Care – PPO

## 2013-02-28 VITALS — BP 126/74 | HR 100 | Temp 97.9°F | Resp 18 | Ht 61.0 in | Wt 140.8 lb

## 2013-02-28 DIAGNOSIS — D638 Anemia in other chronic diseases classified elsewhere: Secondary | ICD-10-CM

## 2013-02-28 DIAGNOSIS — R21 Rash and other nonspecific skin eruption: Secondary | ICD-10-CM

## 2013-02-28 DIAGNOSIS — C772 Secondary and unspecified malignant neoplasm of intra-abdominal lymph nodes: Secondary | ICD-10-CM

## 2013-02-28 DIAGNOSIS — Z5111 Encounter for antineoplastic chemotherapy: Secondary | ICD-10-CM

## 2013-02-28 DIAGNOSIS — C801 Malignant (primary) neoplasm, unspecified: Secondary | ICD-10-CM

## 2013-02-28 DIAGNOSIS — D6481 Anemia due to antineoplastic chemotherapy: Secondary | ICD-10-CM

## 2013-02-28 DIAGNOSIS — I10 Essential (primary) hypertension: Secondary | ICD-10-CM

## 2013-02-28 LAB — CBC WITH DIFFERENTIAL/PLATELET
BASO%: 0 % (ref 0.0–2.0)
Eosinophils Absolute: 0 10*3/uL (ref 0.0–0.5)
HGB: 10 g/dL — ABNORMAL LOW (ref 11.6–15.9)
LYMPH%: 10.5 % — ABNORMAL LOW (ref 14.0–49.7)
MCHC: 33.7 g/dL (ref 31.5–36.0)
MCV: 87.9 fL (ref 79.5–101.0)
MONO#: 0 10*3/uL — ABNORMAL LOW (ref 0.1–0.9)
MONO%: 0.4 % (ref 0.0–14.0)
NEUT#: 2.5 10*3/uL (ref 1.5–6.5)
RBC: 3.38 10*6/uL — ABNORMAL LOW (ref 3.70–5.45)
RDW: 15.2 % — ABNORMAL HIGH (ref 11.2–14.5)
WBC: 2.8 10*3/uL — ABNORMAL LOW (ref 3.9–10.3)
lymph#: 0.3 10*3/uL — ABNORMAL LOW (ref 0.9–3.3)
nRBC: 0 % (ref 0–0)

## 2013-02-28 MED ORDER — DEXAMETHASONE 4 MG PO TABS: ORAL_TABLET | ORAL | Status: DC

## 2013-02-28 MED ORDER — FAMOTIDINE IN NACL 20-0.9 MG/50ML-% IV SOLN
INTRAVENOUS | Status: AC
  Filled 2013-02-28: qty 50

## 2013-02-28 MED ORDER — SODIUM CHLORIDE 0.9 % IJ SOLN
10.0000 mL | INTRAMUSCULAR | Status: DC | PRN
  Administered 2013-02-28: 10 mL
  Filled 2013-02-28: qty 10

## 2013-02-28 MED ORDER — ONDANSETRON 8 MG/50ML IVPB (CHCC)
8.0000 mg | Freq: Once | INTRAVENOUS | Status: AC
  Administered 2013-02-28: 8 mg via INTRAVENOUS

## 2013-02-28 MED ORDER — HEPARIN SOD (PORK) LOCK FLUSH 100 UNIT/ML IV SOLN
500.0000 [IU] | Freq: Once | INTRAVENOUS | Status: AC | PRN
  Administered 2013-02-28: 500 [IU]
  Filled 2013-02-28: qty 5

## 2013-02-28 MED ORDER — SODIUM CHLORIDE 0.9 % IV SOLN
Freq: Once | INTRAVENOUS | Status: AC
  Administered 2013-02-28: 12:00:00 via INTRAVENOUS

## 2013-02-28 MED ORDER — DIPHENHYDRAMINE HCL 50 MG/ML IJ SOLN
50.0000 mg | Freq: Once | INTRAMUSCULAR | Status: AC
  Administered 2013-02-28: 50 mg via INTRAVENOUS

## 2013-02-28 MED ORDER — DIPHENHYDRAMINE HCL 50 MG/ML IJ SOLN
INTRAMUSCULAR | Status: AC
  Filled 2013-02-28: qty 1

## 2013-02-28 MED ORDER — FERROUS FUMARATE 325 (106 FE) MG PO TABS: 1.0000 | ORAL_TABLET | Freq: Every day | ORAL | Status: DC

## 2013-02-28 MED ORDER — ONDANSETRON 8 MG/NS 50 ML IVPB
INTRAVENOUS | Status: AC
  Filled 2013-02-28: qty 8

## 2013-02-28 MED ORDER — FAMOTIDINE IN NACL 20-0.9 MG/50ML-% IV SOLN
20.0000 mg | Freq: Once | INTRAVENOUS | Status: AC
  Administered 2013-02-28: 20 mg via INTRAVENOUS

## 2013-02-28 MED ORDER — DEXAMETHASONE SODIUM PHOSPHATE 20 MG/5ML IJ SOLN
20.0000 mg | Freq: Once | INTRAMUSCULAR | Status: AC
  Administered 2013-02-28: 20 mg via INTRAVENOUS

## 2013-02-28 MED ORDER — DEXAMETHASONE SODIUM PHOSPHATE 20 MG/5ML IJ SOLN
INTRAMUSCULAR | Status: AC
  Filled 2013-02-28: qty 5

## 2013-02-28 MED ORDER — PACLITAXEL CHEMO INJECTION 300 MG/50ML
80.0000 mg/m2 | Freq: Once | INTRAVENOUS | Status: AC
  Administered 2013-02-28: 132 mg via INTRAVENOUS
  Filled 2013-02-28: qty 22

## 2013-02-28 NOTE — Patient Instructions (Signed)
Will start iron by mouth, either name brand Hemocyte if your insurance will cover, or ferrous fumarate.  Take 1 tablet daily away from meals if possible. The Hemocyte has Vitamin C in the pill; if over the counter ferrous fumarate you should take a Vit C tablet with it.

## 2013-02-28 NOTE — Telephone Encounter (Signed)
, °

## 2013-02-28 NOTE — Patient Instructions (Signed)
[facility] Discharge Instructions for Patients Receiving Chemotherapy  Today you received the following chemotherapy agents Taxol  To help prevent nausea and vomiting after your treatment, we encourage you to take your nausea medication    If you develop nausea and vomiting that is not controlled by your nausea medication, call the clinic.   BELOW ARE SYMPTOMS THAT SHOULD BE REPORTED IMMEDIATELY:  *FEVER GREATER THAN 100.5 F  *CHILLS WITH OR WITHOUT FEVER  NAUSEA AND VOMITING THAT IS NOT CONTROLLED WITH YOUR NAUSEA MEDICATION  *UNUSUAL SHORTNESS OF BREATH  *UNUSUAL BRUISING OR BLEEDING  TENDERNESS IN MOUTH AND THROAT WITH OR WITHOUT PRESENCE OF ULCERS  *URINARY PROBLEMS  *BOWEL PROBLEMS  UNUSUAL RASH Items with * indicate a potential emergency and should be followed up as soon as possible.  Feel free to call the clinic you have any questions or concerns. The clinic phone number is [phone].    

## 2013-02-28 NOTE — Telephone Encounter (Signed)
Per staff message and POF I have scheduled appts.  JMW  

## 2013-02-28 NOTE — Progress Notes (Signed)
OFFICE PROGRESS NOTE   02/28/2013   Physicians:ClarkePearson, Reuel Boom; Buckner Malta, Daniel; [place], Erie Noe; Dahlstedt, [name]   INTERVAL HISTORY:  Patient is seen, together with daughter, in continuing attention to advanced serous gyn carcinoma which is being treated with neoadjuvant dose dense taxol carboplatin, due day 8 cycle 2 today. She tolerated the day 1 cycle 2 treatment better overall than cycle 1, with nausea mostly controlled and pushing herself to take po's. JJ stent was more uncomfortable yesterday, not too noticeable today. She has had no bleeding, no abdominal pain, no fever and ileostomy has continued to function as usual. The herpetic oral lesion resolved quickly on oral acycloir.   ONCOLOGIC HISTORY Patient has history of ulcerative colitis for which she had total colectomy with ileostomy in 1990, and subsequently difficult gyn exams. She had been generally stable until contracting norovirus in May 2014, with prolonged illness and weight loss then. She had yearly PE and first gyn exam by Dr Pennie Rushing in June 2014. In July 2014 she developed RLE swelling and pain, seen by PCP Dr Laury Axon with venous dopplers reportedly negative, however those symptoms persisted. She developed nausea, vomiting and temperature 101 by 8-16- 2014, with abdominal US 01-10-13 which showed mild right hydronephrosis. She was then admitted to [facility] from 8-20 thru 01-17-13; with evaluation including CT AP x2; consultations by Drs Retta Diones, Truett Perna, Cornett and ClarkePearson;right ureteral stent placement; and US biopsy of left inguinal lymph node. Pathology [phone] carcinoma with immunohistochemical stains consistent with serous carcinoma of gyn origin.  Dr Yolande Jolly  recommended dose dense taxol carboplatin, with possible interval debulking depending on response after ~ 3 cycles. Cycle 1 day 1 dose dense taxol carboplatin was given on 01-31-13; day 15 cycle 1 was held with platelets  49k. Carbo AUC was decreased with cycle 2 due to thrombocytopenia and treatment delay.   Review of systems as above, also: No intraoral ulcers as she had with active ulcerative colitis. Some fatigue but is out of bed most of day. Less swelling RLE in AMs, this mostly from knee down and she will try knee high teds from medical supply.  Remainder of 10 point Review of Systems negative.  Objective:  Vital signs in last 24 hours:  BP 126/74  Pulse 100  Temp(Src) 97.9 F (36.6 C) (Oral)  Resp 18  Ht 5\' 1"  (1.549 m)  Wt 140 lb 12.8 oz (63.866 kg)  BMI 26.62 kg/m2  Weight stable.  Alert, oriented and appropriate. Ambulatory without assistance. Pale. NAD. Is beginning to lose hair  HEENT:PERRL, sclerae not icteric. Oral mucosa moist without lesions, posterior pharynx clear. Minimal residual erythema on upper lip. Neck supple. No JVD.  Lymphatics:no cervical,suraclavicular, axillary or inguinal adenopathy Resp: clear to auscultation bilaterally and normal percussion bilaterally Cardio: regular rate and rhythm. No gallop. GI: soft, nontender, not distended, no mass or organomegaly.  bowel sounds present. Ileostomy bag with liquid stool. Musculoskeletal/ Extremities: without pitting edema, cords, tenderness. Back not tender Neuro: no peripheral neuropathy. Otherwise nonfocal Skin without rash, ecchymosis, petechiae PAC without tenderness or erythema  Lab Results:  Results for orders placed in visit on 02/28/13  CBC WITH DIFFERENTIAL      Result Value Range   WBC 2.8 (*) 3.9 - 10.3 10e3/uL   NEUT# 2.5  1.5 - 6.5 10e3/uL   HGB 10.0 (*) 11.6 - 15.9 g/dL   HCT 30.8 (*) 65.7 - 84.6 %   Platelets 292  145 - 400 10e3/uL   MCV 87.9  79.5 -  101.0 fL   MCH 29.6  25.1 - 34.0 pg   MCHC 33.7  31.5 - 36.0 g/dL   RBC 7.82 (*) 9.56 - 2.13 10e6/uL   RDW 15.2 (*) 11.2 - 14.5 %   lymph# 0.3 (*) 0.9 - 3.3 10e3/uL   MONO# 0.0 (*) 0.1 - 0.9 10e3/uL   Eosinophils Absolute 0.0  0.0 - 0.5 10e3/uL    Basophils Absolute 0.0  0.0 - 0.1 10e3/uL   NEUT% 89.1 (*) 38.4 - 76.8 %   LYMPH% 10.5 (*) 14.0 - 49.7 %   MONO% 0.4  0.0 - 14.0 %   EOS% 0.0  0.0 - 7.0 %   BASO% 0.0  0.0 - 2.0 %   nRBC 0  0 - 0 %     Studies/Results:  No results found.  Medications: I have reviewed the patient's current medications. She has oral acyclovir available and prefers to start this as soon as any symptoms of fever blisters rather than take ongoing prophylactic doses. She will begin ferrous fumarate as Hemocyte, on empty stomach if tolerates. Premed decadron refilled today. She has not had flu vaccine yet, needs this on nonchemo day.  Assessment/Plan:  1. serous gyn carcinoma: large pelvic mass with right ureteral obstruction, small bowel fistula into the mass and adenopathy: neoadjuvant dose dense carbo taxol underway, day 8 cycle 2 today. I will see her back with day 15 cycle 2 on 03-07-13. She has not had gCSF thus far, did have carbo dose decreased this cycle. 2.Ulcerative colitis: post total colectomy 1990 with ileostomy  3.Right ureteral obstruction from gyn malignancy, with stent placed by Dr Retta Diones [phone], very difficult procedure. She has appointment back to Dr Retta Diones ~ Oct 13.  4. Anemia multifactorial with chemo, chronic disease and ongoing loss with blood draws/ hematuria from stent. Iron studies low normal but seems reasonable to add oral iron now if she tolerates. 5. PAC in by IR  6.herpetic lesion lip resolved with acyclovir   7.hx HTN: hold antihypertensive if BP < ~ 120 to 130 systolic  8.rash to PCN  9.multiple blood transfusions for ulcerative colitis problems previously   Patient and daughter are comfortable with discussion and plan    [name],LENNIS P, MD   02/28/2013, 10:49 AM

## 2013-03-06 ENCOUNTER — Other Ambulatory Visit: Payer: Self-pay | Admitting: Oncology

## 2013-03-07 ENCOUNTER — Ambulatory Visit (HOSPITAL_BASED_OUTPATIENT_CLINIC_OR_DEPARTMENT_OTHER): Payer: BC Managed Care – PPO | Admitting: Oncology

## 2013-03-07 ENCOUNTER — Other Ambulatory Visit (HOSPITAL_BASED_OUTPATIENT_CLINIC_OR_DEPARTMENT_OTHER): Payer: BC Managed Care – PPO | Admitting: Lab

## 2013-03-07 ENCOUNTER — Ambulatory Visit (HOSPITAL_COMMUNITY)
Admission: RE | Admit: 2013-03-07 | Discharge: 2013-03-07 | Disposition: A | Discharge status: Home / Self Care | Payer: BC Managed Care – PPO | Source: Ambulatory Visit | Attending: Oncology | Admitting: Oncology

## 2013-03-07 ENCOUNTER — Encounter: Payer: Self-pay | Admitting: Oncology

## 2013-03-07 ENCOUNTER — Ambulatory Visit (HOSPITAL_BASED_OUTPATIENT_CLINIC_OR_DEPARTMENT_OTHER): Payer: BC Managed Care – PPO

## 2013-03-07 ENCOUNTER — Ambulatory Visit: Payer: BC Managed Care – PPO | Admitting: Lab

## 2013-03-07 VITALS — BP 125/66 | HR 96 | Temp 98.3°F | Resp 20

## 2013-03-07 VITALS — BP 137/75 | HR 120 | Temp 98.0°F | Resp 18 | Ht 61.0 in | Wt 139.8 lb

## 2013-03-07 DIAGNOSIS — D6481 Anemia due to antineoplastic chemotherapy: Secondary | ICD-10-CM

## 2013-03-07 DIAGNOSIS — C801 Malignant (primary) neoplasm, unspecified: Secondary | ICD-10-CM

## 2013-03-07 DIAGNOSIS — C772 Secondary and unspecified malignant neoplasm of intra-abdominal lymph nodes: Secondary | ICD-10-CM

## 2013-03-07 DIAGNOSIS — D500 Iron deficiency anemia secondary to blood loss (chronic): Secondary | ICD-10-CM

## 2013-03-07 DIAGNOSIS — Z5189 Encounter for other specified aftercare: Secondary | ICD-10-CM

## 2013-03-07 DIAGNOSIS — T451X5A Adverse effect of antineoplastic and immunosuppressive drugs, initial encounter: Secondary | ICD-10-CM | POA: Insufficient documentation

## 2013-03-07 LAB — CBC WITH DIFFERENTIAL/PLATELET
Basophils Absolute: 0 10*3/uL (ref 0.0–0.1)
EOS%: 0 % (ref 0.0–7.0)
Eosinophils Absolute: 0 10*3/uL (ref 0.0–0.5)
HCT: 26.9 % — ABNORMAL LOW (ref 34.8–46.6)
HGB: 9.2 g/dL — ABNORMAL LOW (ref 11.6–15.9)
MCH: 29.4 pg (ref 25.1–34.0)
NEUT%: 80.1 % — ABNORMAL HIGH (ref 38.4–76.8)
Platelets: 89 10*3/uL — ABNORMAL LOW (ref 145–400)
RDW: 15.2 % — ABNORMAL HIGH (ref 11.2–14.5)
lymph#: 0.3 10*3/uL — ABNORMAL LOW (ref 0.9–3.3)

## 2013-03-07 LAB — COMPREHENSIVE METABOLIC PANEL (CC13)
ALT: 13 U/L (ref 0–55)
Albumin: 2.8 g/dL — ABNORMAL LOW (ref 3.5–5.0)
Alkaline Phosphatase: 72 U/L (ref 40–150)
BUN: 10.9 mg/dL (ref 7.0–26.0)
CO2: 24 mEq/L (ref 22–29)
Calcium: 9.5 mg/dL (ref 8.4–10.4)
Chloride: 101 mEq/L (ref 98–109)
Glucose: 204 mg/dl — ABNORMAL HIGH (ref 70–140)
Potassium: 3.2 mEq/L — ABNORMAL LOW (ref 3.5–5.1)

## 2013-03-07 LAB — PREPARE RBC (CROSSMATCH)

## 2013-03-07 MED ORDER — ACETAMINOPHEN 325 MG PO TABS
ORAL_TABLET | ORAL | Status: AC
  Filled 2013-03-07: qty 2

## 2013-03-07 MED ORDER — ACETAMINOPHEN 325 MG PO TABS
650.0000 mg | ORAL_TABLET | Freq: Once | ORAL | Status: AC
  Administered 2013-03-07: 650 mg via ORAL

## 2013-03-07 MED ORDER — FILGRASTIM 300 MCG/0.5ML IJ SOLN
300.0000 ug | Freq: Once | INTRAMUSCULAR | Status: AC
  Administered 2013-03-07: 300 ug via SUBCUTANEOUS
  Filled 2013-03-07: qty 0.5

## 2013-03-07 MED ORDER — HEPARIN SOD (PORK) LOCK FLUSH 100 UNIT/ML IV SOLN
500.0000 [IU] | Freq: Every day | INTRAVENOUS | Status: AC | PRN
  Administered 2013-03-07: 500 [IU]
  Filled 2013-03-07: qty 5

## 2013-03-07 MED ORDER — DIPHENHYDRAMINE HCL 25 MG PO CAPS
25.0000 mg | ORAL_CAPSULE | Freq: Once | ORAL | Status: AC
  Administered 2013-03-07: 25 mg via ORAL

## 2013-03-07 MED ORDER — SODIUM CHLORIDE 0.9 % IJ SOLN
10.0000 mL | INTRAMUSCULAR | Status: AC | PRN
  Administered 2013-03-07: 10 mL
  Filled 2013-03-07: qty 10

## 2013-03-07 MED ORDER — SODIUM CHLORIDE 0.9 % IV SOLN
250.0000 mL | Freq: Once | INTRAVENOUS | Status: AC
  Administered 2013-03-07: 250 mL via INTRAVENOUS

## 2013-03-07 MED ORDER — DIPHENHYDRAMINE HCL 25 MG PO CAPS
ORAL_CAPSULE | ORAL | Status: AC
  Filled 2013-03-07: qty 1

## 2013-03-07 NOTE — Patient Instructions (Signed)
Blood Transfusion  A blood transfusion replaces your blood or some of its parts. Blood is replaced when you have lost blood because of surgery, an accident, or for severe blood conditions like anemia. You can donate blood to be used on yourself if you have a planned surgery. If you lose blood during that surgery, your own blood can be given back to you. Any blood given to you is checked to make sure it matches your blood type. Your temperature, blood pressure, and heart rate (vital signs) will be checked often.  GET HELP RIGHT AWAY IF:   You feel sick to your stomach (nauseous) or throw up (vomit).  You have watery poop (diarrhea).  You have shortness of breath or trouble breathing.  You have blood in your pee (urine) or have dark colored pee.  You have chest pain or tightness.  Your eyes or skin turn yellow (jaundice).  You have a temperature by mouth above 102 F (38.9 C), not controlled by medicine.  You start to shake and have chills.  You develop a a red rash (hives) or feel itchy.  You develop lightheadedness or feel confused.  You develop back, joint, or muscle pain.  You do not feel hungry (lost appetite).  You feel tired, restless, or nervous.  You develop belly (abdominal) cramps. Document Released: 08/06/2008 Document Revised: 08/02/2011 Document Reviewed: 08/06/2008 [employer] Patient Information 2014 [employer].  

## 2013-03-07 NOTE — Patient Instructions (Signed)
Continue iron as possible  Call if any significant bleeding before next visit. No aspirin or ibuprofen now; tylenol ok. Call if fever 100.5 or higher or symptoms of infection. You will get neupogen shot today to help white blood cells increase.

## 2013-03-07 NOTE — Progress Notes (Signed)
OFFICE PROGRESS NOTE   03/07/2013   Physicians:ClarkePearson, Reuel Boom; Buckner Malta, Daniel; [place], Erie Noe; Dahlstedt, [name]   INTERVAL HISTORY:  Patient is seen, initially alone and then accompanied by daughter, in continuing attention to recently diagnosed advanced serous gyn carcinoma, for which she is receiving neoadjuvant chemotherapy with dose dense carboplatin and taxol. She had day 8 cycle 2 on 02-28-13, however she is symptomatic from progressive anemia today and has platelets <90K such that we will transfuse PRBCs instead of giving day 15 cycle 2 chemotherapy now. She is SOB with minimal exertion in exam room and has felt too weak to drive this week.. She has had no bleeding other than ongoing hematuria related to JJ stent. She denies abdominal or pelvic pain, vomiting, fever, cough or chest pain.  PAC has functioned well.  ONCOLOGIC HISTORY Patient has history of ulcerative colitis for which she had total colectomy with ileostomy in 1990, and subsequently difficult gyn exams. She had been generally stable until contracting norovirus in May 2014, with prolonged illness and weight loss then. She had yearly PE and first gyn exam by Dr Pennie Rushing in June 2014. In July 2014 she developed RLE swelling and pain, seen by PCP Dr Laury Axon with venous dopplers reportedly negative, however those symptoms persisted. She developed nausea, vomiting and temperature 101 by 8-16- 2014, with abdominal US 01-10-13 which showed mild right hydronephrosis. She was then admitted to [facility] from 8-20 thru 01-17-13; with evaluation including CT AP x2; consultations by Drs Retta Diones, Truett Perna, Cornett and ClarkePearson;right ureteral stent placement; and US biopsy of left inguinal lymph node. Pathology [phone] carcinoma with immunohistochemical stains consistent with serous carcinoma of gyn origin. She has seen Dr Yolande Jolly since DC from hospital, with recommendation for dose dense taxol  carboplatin, with possible interval debulking depending on response after ~ 3 cycles. Cycle 1 day 1 dose dense taxol carboplatin was given on 01-31-13; day 15 cycle 1 was held with platelets 49k.   Review of systems as above, also: No significant nausea. Taste still unpleasant tho peaches good.Slight sinus HA resolved with tylenol. Swelling RLE better overall, none now in thigh. Remainder of 10 point Review of Systems negative.  Objective:  Vital signs in last 24 hours:  BP 137/75  Pulse 120  Temp(Src) 98 F (36.7 C) (Oral)  Resp 18  Ht 5\' 1"  (1.549 m)  Wt 139 lb 12.8 oz (63.413 kg)  BMI 26.43 kg/m2  SpO2 100% Weight stable. Alert, oriented and appropriate. Ambulatory slowly but without assistance. Pale.   Partial alopecia  HEENT:PERRL, sclerae not icteric. Oral mucosa moist without lesions, mucous membranes pale, posterior pharynx clear.  Neck supple. No JVD.  Lymphatics:no cervical,suraclavicular or inguinal adenopathy Resp: clear to auscultation bilaterally and normal percussion bilaterally Cardio: regular rate and rhythm, tachy. No gallop. GI: soft, nontender, not distended, no organomegaly.  bowel sounds present. .Ileostomy functioning Musculoskeletal/ Extremities: without pitting edema, cords, tenderness. Back not tender Neuro: no peripheral neuropathy. Otherwise nonfocal Skin without rash, ecchymosis, petechiae Portacath-without erythema or tenderness  Lab Results:  Results for orders placed in visit on 03/07/13  CBC WITH DIFFERENTIAL      Result Value Range   WBC 1.9 (*) 3.9 - 10.3 10e3/uL   NEUT# 1.5  1.5 - 6.5 10e3/uL   HGB 9.2 (*) 11.6 - 15.9 g/dL   HCT 40.9 (*) 81.1 - 91.4 %   Platelets 89 (*) 145 - 400 10e3/uL   MCV 85.9  79.5 - 101.0 fL   MCH 29.4  25.1 - 34.0 pg   MCHC 34.2  31.5 - 36.0 g/dL   RBC 1.61 (*) 0.96 - 0.45 10e6/uL   RDW 15.2 (*) 11.2 - 14.5 %   lymph# 0.3 (*) 0.9 - 3.3 10e3/uL   MONO# 0.1  0.1 - 0.9 10e3/uL   Eosinophils Absolute 0.0  0.0  - 0.5 10e3/uL   Basophils Absolute 0.0  0.0 - 0.1 10e3/uL   NEUT% 80.1 (*) 38.4 - 76.8 %   LYMPH% 17.2  14.0 - 49.7 %   MONO% 2.7  0.0 - 14.0 %   EOS% 0.0  0.0 - 7.0 %   BASO% 0.0  0.0 - 2.0 %   nRBC 0  0 - 0 %    CMET available after visit normal with exception of K 3.2, glu 204, alb 2.8 (glucose elevated c/w premedication decadron)  Studies/Results:  No results found.  Medications: I have reviewed the patient's current medications. She is tolerating oral Hemocyte. She did take premed decadron last pm. Neupogen 300 to be given today.    Assessment/Plan: 1. serous gyn carcinoma: large pelvic mass with right ureteral obstruction, small bowel fistula into the mass and adenopathy: hold day 15 cycle 2 today to allow PRBC transfusion. I will see her on 10-20 and will treat 10-22 either with "day 15" taxol or with day 1 cycle 3 carbo + taxol depending on counts. As she is quite symptomatic even with this degree of anemia and with ongoing chemo, we will give 2 units PRBCs today. Neupogen x1 also today. 2.Ulcerative colitis: post total colectomy 1990 with ileostomy  3.Right ureteral obstruction from gyn malignancy,  stent placed by Dr Retta Diones [phone], very difficult procedure. His note received and reviewed from follow up visit this week, requests to be notified when she has next CT. 4.multiple blood transfusions for ulcerative colitis problems previously 5. PAC in by IR  6.herpetic lesion lip resolved: acyclovir available if recurs 7.hx HTN. I have requested that she hold antihypertensive if BP < ~ 120 to 130 systolic  8.rash to PCN  9. Flu vaccine not given yet: may be able to do this with visit 03-12-13   Patient and daughter are in agreement with plan and know to call if needed prior to next scheduled appointment     Shelden Raborn P, MD   03/07/2013, 8:50 AM

## 2013-03-08 LAB — TYPE AND SCREEN
ABO/RH(D): O POS
DAT, IgG: NEGATIVE
Donor AG Type: NEGATIVE
Unit division: 0
Unit division: 0

## 2013-03-08 LAB — CA 125: CA 125: 2704.8 U/mL — ABNORMAL HIGH (ref 0.0–30.2)

## 2013-03-09 ENCOUNTER — Telehealth: Payer: Self-pay

## 2013-03-09 NOTE — Telephone Encounter (Signed)
Message copied by Lorine Bears on Fri Mar 09, 2013  4:21 PM ------      Message from: Jama Flavors P      Created: Thu Mar 08, 2013  7:49 PM       Labs seen and need follow up: please let her know marker has gone down to 2700, ~ 50% drop from where we started            Cc LA, TH ------

## 2013-03-09 NOTE — Telephone Encounter (Signed)
Told Kirsten Schroeder the results of her CA-125 as noted below by Dr. Darrold Span. Kirsten Schroeder stated that she felt better and has more energy since her blood  transfusion

## 2013-03-11 ENCOUNTER — Other Ambulatory Visit: Payer: Self-pay | Admitting: Oncology

## 2013-03-12 ENCOUNTER — Ambulatory Visit (HOSPITAL_BASED_OUTPATIENT_CLINIC_OR_DEPARTMENT_OTHER): Payer: BC Managed Care – PPO | Admitting: Oncology

## 2013-03-12 ENCOUNTER — Telehealth: Payer: Self-pay | Admitting: Oncology

## 2013-03-12 ENCOUNTER — Encounter: Payer: Self-pay | Admitting: Oncology

## 2013-03-12 ENCOUNTER — Other Ambulatory Visit (HOSPITAL_BASED_OUTPATIENT_CLINIC_OR_DEPARTMENT_OTHER): Payer: BC Managed Care – PPO | Admitting: Lab

## 2013-03-12 ENCOUNTER — Telehealth: Payer: Self-pay | Admitting: *Deleted

## 2013-03-12 VITALS — BP 108/70 | HR 98 | Temp 98.2°F | Resp 18 | Ht 61.0 in | Wt 142.3 lb

## 2013-03-12 DIAGNOSIS — C801 Malignant (primary) neoplasm, unspecified: Secondary | ICD-10-CM

## 2013-03-12 DIAGNOSIS — C772 Secondary and unspecified malignant neoplasm of intra-abdominal lymph nodes: Secondary | ICD-10-CM

## 2013-03-12 DIAGNOSIS — Z23 Encounter for immunization: Secondary | ICD-10-CM

## 2013-03-12 DIAGNOSIS — K5190 Ulcerative colitis, unspecified, without complications: Secondary | ICD-10-CM

## 2013-03-12 LAB — CBC WITH DIFFERENTIAL/PLATELET
Eosinophils Absolute: 0 10*3/uL (ref 0.0–0.5)
HCT: 36.7 % (ref 34.8–46.6)
HGB: 12.3 g/dL (ref 11.6–15.9)
LYMPH%: 22.2 % (ref 14.0–49.7)
MCHC: 33.5 g/dL (ref 31.5–36.0)
MCV: 87.8 fL (ref 79.5–101.0)
MONO#: 0.7 10*3/uL (ref 0.1–0.9)
MONO%: 12.3 % (ref 0.0–14.0)
NEUT%: 64.3 % (ref 38.4–76.8)
Platelets: 101 10*3/uL — ABNORMAL LOW (ref 145–400)
RBC: 4.18 10*6/uL (ref 3.70–5.45)
RDW: 17.1 % — ABNORMAL HIGH (ref 11.2–14.5)
WBC: 5.7 10*3/uL (ref 3.9–10.3)
lymph#: 1.3 10*3/uL (ref 0.9–3.3)

## 2013-03-12 MED ORDER — INFLUENZA VAC SPLIT QUAD 0.5 ML IM SUSP
0.5000 mL | INTRAMUSCULAR | Status: AC
  Administered 2013-03-12: 0.5 mL via INTRAMUSCULAR
  Filled 2013-03-12: qty 0.5

## 2013-03-12 NOTE — Telephone Encounter (Signed)
Per staff message and POF I have scheduled appts.  JMW  

## 2013-03-12 NOTE — Patient Instructions (Signed)
Continue oral iron as you are doing

## 2013-03-12 NOTE — Progress Notes (Signed)
OFFICE PROGRESS NOTE   03/12/2013   Physicians:ClarkePearson, Reuel Boom; Buckner Malta, Duane Lope, Erie Noe; Dahlstedt, [name]   INTERVAL HISTORY:  Patient is seen, alone for visit, feeling markedly better since 2 units PRBCs on 03-07-13, with day 15 cycle 2 dose dense taxol carbo held on that day due to symptomatic anemia. She is for day 1 cycle 3 on 03-14-13.  She is now tolerating oral ferrous fumarate once daily. She has no peripheral neuropathy symptoms. Appetite and energy have been much better since PRBCs. She has had no gross hematuria for past 3-4 days.   ONCOLOGIC HISTORY atient has history of ulcerative colitis for which she had total colectomy with ileostomy in 1990, and subsequently difficult gyn exams. She had been generally stable until contracting norovirus in May 2014, with prolonged illness and weight loss then. She had yearly PE and first gyn exam by Dr Pennie Rushing in June 2014. In July 2014 she developed RLE swelling and pain, seen by PCP Dr Laury Axon with venous dopplers reportedly negative, however those symptoms persisted. She developed nausea, vomiting and temperature 101 by 8-16- 2014, with abdominal US 01-10-13 which showed mild right hydronephrosis. She was then admitted to [facility] from 8-20 thru 01-17-13; with evaluation including CT AP x2; consultations by Drs Retta Diones, Truett Perna, Cornett and ClarkePearson;right ureteral stent placement; and US biopsy of left inguinal lymph node. 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 She has seen Dr Yolande Jolly since DC from hospital, with recommendation for dose dense taxol carboplatin, with possible interval debulking depending on response after ~ 3 cycles. Cycle 1 day 1 dose dense taxol carboplatin was given on 01-31-13; day 15 cycle 1 was held with platelets 49k. Carboplatin dose was decreased to AUC=4 with cycle 2 on 02-21-13, platelets 101k on day  19 cycle 2 (with day 15 held).   Review of systems as above, also: Not SOB now. Ileostomy functioning as usual. No other bleeding. No problems with PAC. No significant abdominal or pelvic discomfort. No fever or symptoms of infection Remainder of 10 point Review of Systems negative.  Objective:  Vital signs in last 24 hours:  BP 108/70  Pulse 98  Temp(Src) 98.2 F (36.8 C) (Oral)  Resp 18  Ht 5\' 1"  (1.549 m)  Wt 142 lb 4.8 oz (64.547 kg)  BMI 26.9 kg/m2 weight is up ~ 2 lbs.  Alert, oriented and appropriate. Quickly and easily mobile today.  Alopecia  HEENT:PERRL, sclerae not icteric. Oral mucosa moist without lesions, posterior pharynx clear. No active herpetic lesions.  Neck supple. No JVD.  Lymphatics:no cervical,suraclavicular, axillary or inguinal adenopathy Resp: clear to auscultation bilaterally and normal percussion bilaterally Cardio: regular rate and rhythm. No gallop. GI: soft, nontender, no longer any distension, no mass or organomegaly. Normally active bowel sounds. Ileostomy not remarkable Musculoskeletal/ Extremities: without pitting edema, cords, tenderness. Back nontender Neuro/ psych: no peripheral neuropathy. Otherwise nonfocal and as above Skin without rash, ecchymosis, petechiae Breasts: without dominant mass, skin or nipple findings. Axillae benign. Portacath-without erythema or tenderness  Lab Results:  Results for orders placed in visit on 03/12/13  CBC WITH DIFFERENTIAL      Result Value Range   WBC 5.7  3.9 - 10.3 10e3/uL   NEUT# 3.7  1.5 - 6.5 10e3/uL   HGB 12.3  11.6 - 15.9 g/dL   HCT 40.9  81.1 - 91.4 %   Platelets 101 (*) 145 - 400 10e3/uL   MCV 87.8  79.5 - 101.0  fL   MCH 29.4  25.1 - 34.0 pg   MCHC 33.5  31.5 - 36.0 g/dL   RBC 1.61  0.96 - 0.45 10e6/uL   RDW 17.1 (*) 11.2 - 14.5 %   lymph# 1.3  0.9 - 3.3 10e3/uL   MONO# 0.7  0.1 - 0.9 10e3/uL   Eosinophils Absolute 0.0  0.0 - 0.5 10e3/uL   Basophils Absolute 0.0  0.0 - 0.1 10e3/uL    NEUT% 64.3  38.4 - 76.8 %   LYMPH% 22.2  14.0 - 49.7 %   MONO% 12.3  0.0 - 14.0 %   EOS% 0.5  0.0 - 7.0 %   BASO% 0.7  0.0 - 2.0 %   nRBC 0  0 - 0 %    CA 125 from 03-07-13 was 2700, this down from 4130 on 02-19-13 and 5626 as baseline for the chemotherapy.  Studies/Results:  No results found.  Medications: I have reviewed the patient's current medications. Continue po ferrous fumarate on empty stomach with OJ. 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 She will have day 1 cycle 3 on 03-14-13, doses same as cycle 2. She is otherwise tolerating the chemotherapy and is clinically having response. I will see her again 10-29 or sooner if needed. She needs repeat scans and re-evaluation by Dr Yolande Jolly around end of cycle 3. Message sent to gyn oncology in this regard now. 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 Dr Retta Diones requests to be notified when she has next CT.  4.multiple blood transfusions for ulcerative colitis problems previously, and PRBCs 2 units on 03-07-13 with good improvement in symptoms. Continue po iron now.  5. PAC in by IR  6.herpetic lesion lip resolved: acyclovir available if recurs  7.hx HTN. I have requested that she hold antihypertensive if BP < ~ 120 to 130 systolic  8.rash to PCN  9. Flu vaccine given today    Patient was in agreement with plan as above and has had questions answered to her satisfaction.   [name],LENNIS P, MD   03/12/2013, 4:03 PM

## 2013-03-14 ENCOUNTER — Ambulatory Visit (HOSPITAL_BASED_OUTPATIENT_CLINIC_OR_DEPARTMENT_OTHER): Payer: BC Managed Care – PPO

## 2013-03-14 VITALS — BP 107/65 | HR 96 | Temp 98.0°F

## 2013-03-14 DIAGNOSIS — Z5111 Encounter for antineoplastic chemotherapy: Secondary | ICD-10-CM

## 2013-03-14 DIAGNOSIS — C772 Secondary and unspecified malignant neoplasm of intra-abdominal lymph nodes: Secondary | ICD-10-CM

## 2013-03-14 DIAGNOSIS — C801 Malignant (primary) neoplasm, unspecified: Secondary | ICD-10-CM

## 2013-03-14 MED ORDER — DIPHENHYDRAMINE HCL 50 MG/ML IJ SOLN
50.0000 mg | Freq: Once | INTRAMUSCULAR | Status: AC
  Administered 2013-03-14: 50 mg via INTRAVENOUS

## 2013-03-14 MED ORDER — PACLITAXEL CHEMO INJECTION 300 MG/50ML
80.0000 mg/m2 | Freq: Once | INTRAVENOUS | Status: AC
  Administered 2013-03-14: 132 mg via INTRAVENOUS
  Filled 2013-03-14: qty 22

## 2013-03-14 MED ORDER — FAMOTIDINE IN NACL 20-0.9 MG/50ML-% IV SOLN
20.0000 mg | Freq: Once | INTRAVENOUS | Status: AC
  Administered 2013-03-14: 20 mg via INTRAVENOUS

## 2013-03-14 MED ORDER — FAMOTIDINE IN NACL 20-0.9 MG/50ML-% IV SOLN
INTRAVENOUS | Status: AC
  Filled 2013-03-14: qty 50

## 2013-03-14 MED ORDER — DIPHENHYDRAMINE HCL 50 MG/ML IJ SOLN
INTRAMUSCULAR | Status: AC
  Filled 2013-03-14: qty 1

## 2013-03-14 MED ORDER — ONDANSETRON 16 MG/50ML IVPB (CHCC)
INTRAVENOUS | Status: AC
  Filled 2013-03-14: qty 16

## 2013-03-14 MED ORDER — SODIUM CHLORIDE 0.9 % IV SOLN
Freq: Once | INTRAVENOUS | Status: AC
  Administered 2013-03-14: 09:00:00 via INTRAVENOUS

## 2013-03-14 MED ORDER — ONDANSETRON 16 MG/50ML IVPB (CHCC)
16.0000 mg | Freq: Once | INTRAVENOUS | Status: AC
  Administered 2013-03-14: 16 mg via INTRAVENOUS

## 2013-03-14 MED ORDER — DEXAMETHASONE SODIUM PHOSPHATE 20 MG/5ML IJ SOLN
INTRAMUSCULAR | Status: AC
  Filled 2013-03-14: qty 5

## 2013-03-14 MED ORDER — SODIUM CHLORIDE 0.9 % IJ SOLN
10.0000 mL | INTRAMUSCULAR | Status: DC | PRN
  Administered 2013-03-14: 10 mL
  Filled 2013-03-14: qty 10

## 2013-03-14 MED ORDER — SODIUM CHLORIDE 0.9 % IV SOLN
566.5000 mg | Freq: Once | INTRAVENOUS | Status: AC
  Administered 2013-03-14: 570 mg via INTRAVENOUS
  Filled 2013-03-14: qty 57

## 2013-03-14 MED ORDER — DEXAMETHASONE SODIUM PHOSPHATE 20 MG/5ML IJ SOLN
20.0000 mg | Freq: Once | INTRAMUSCULAR | Status: AC
  Administered 2013-03-14: 20 mg via INTRAVENOUS

## 2013-03-14 MED ORDER — HEPARIN SOD (PORK) LOCK FLUSH 100 UNIT/ML IV SOLN
500.0000 [IU] | Freq: Once | INTRAVENOUS | Status: AC | PRN
  Administered 2013-03-14: 500 [IU]
  Filled 2013-03-14: qty 5

## 2013-03-14 NOTE — Patient Instructions (Signed)
[facility] Discharge Instructions for Patients Receiving Chemotherapy  Today you received the following chemotherapy agents:  Taxol and Carboplatin  To help prevent nausea and vomiting after your treatment, we encourage you to take your nausea medication as ordered per MD.   If you develop nausea and vomiting that is not controlled by your nausea medication, call the clinic.   BELOW ARE SYMPTOMS THAT SHOULD BE REPORTED IMMEDIATELY:  *FEVER GREATER THAN 100.5 F  *CHILLS WITH OR WITHOUT FEVER  NAUSEA AND VOMITING THAT IS NOT CONTROLLED WITH YOUR NAUSEA MEDICATION  *UNUSUAL SHORTNESS OF BREATH  *UNUSUAL BRUISING OR BLEEDING  TENDERNESS IN MOUTH AND THROAT WITH OR WITHOUT PRESENCE OF ULCERS  *URINARY PROBLEMS  *BOWEL PROBLEMS  UNUSUAL RASH Items with * indicate a potential emergency and should be followed up as soon as possible.  Feel free to call the clinic you have any questions or concerns. The clinic phone number is [phone].    

## 2013-03-18 ENCOUNTER — Other Ambulatory Visit: Payer: Self-pay | Admitting: Oncology

## 2013-03-21 ENCOUNTER — Telehealth: Payer: Self-pay | Admitting: Oncology

## 2013-03-21 ENCOUNTER — Ambulatory Visit (HOSPITAL_BASED_OUTPATIENT_CLINIC_OR_DEPARTMENT_OTHER): Payer: BC Managed Care – PPO | Admitting: Oncology

## 2013-03-21 ENCOUNTER — Ambulatory Visit (HOSPITAL_BASED_OUTPATIENT_CLINIC_OR_DEPARTMENT_OTHER): Payer: BC Managed Care – PPO

## 2013-03-21 ENCOUNTER — Other Ambulatory Visit: Payer: Self-pay

## 2013-03-21 ENCOUNTER — Telehealth: Payer: Self-pay | Admitting: *Deleted

## 2013-03-21 ENCOUNTER — Encounter: Payer: Self-pay | Admitting: Oncology

## 2013-03-21 ENCOUNTER — Other Ambulatory Visit (HOSPITAL_BASED_OUTPATIENT_CLINIC_OR_DEPARTMENT_OTHER): Payer: BC Managed Care – PPO | Admitting: Lab

## 2013-03-21 VITALS — BP 103/67 | HR 108 | Temp 98.1°F | Resp 18 | Wt 142.4 lb

## 2013-03-21 DIAGNOSIS — Z5111 Encounter for antineoplastic chemotherapy: Secondary | ICD-10-CM

## 2013-03-21 DIAGNOSIS — C801 Malignant (primary) neoplasm, unspecified: Secondary | ICD-10-CM

## 2013-03-21 DIAGNOSIS — C772 Secondary and unspecified malignant neoplasm of intra-abdominal lymph nodes: Secondary | ICD-10-CM

## 2013-03-21 LAB — CBC WITH DIFFERENTIAL/PLATELET
BASO%: 0 % (ref 0.0–2.0)
EOS%: 0 % (ref 0.0–7.0)
LYMPH%: 6.5 % — ABNORMAL LOW (ref 14.0–49.7)
MCH: 29.2 pg (ref 25.1–34.0)
MCHC: 34.2 g/dL (ref 31.5–36.0)
MONO#: 0 10*3/uL — ABNORMAL LOW (ref 0.1–0.9)
Platelets: 304 10*3/uL (ref 145–400)
RBC: 3.9 10*6/uL (ref 3.70–5.45)
WBC: 5.1 10*3/uL (ref 3.9–10.3)
lymph#: 0.3 10*3/uL — ABNORMAL LOW (ref 0.9–3.3)
nRBC: 0 % (ref 0–0)

## 2013-03-21 MED ORDER — FAMOTIDINE IN NACL 20-0.9 MG/50ML-% IV SOLN
INTRAVENOUS | Status: AC
  Filled 2013-03-21: qty 50

## 2013-03-21 MED ORDER — DEXAMETHASONE SODIUM PHOSPHATE 20 MG/5ML IJ SOLN
INTRAMUSCULAR | Status: AC
  Filled 2013-03-21: qty 5

## 2013-03-21 MED ORDER — FAMOTIDINE IN NACL 20-0.9 MG/50ML-% IV SOLN
20.0000 mg | Freq: Once | INTRAVENOUS | Status: AC
  Administered 2013-03-21: 20 mg via INTRAVENOUS

## 2013-03-21 MED ORDER — DIPHENHYDRAMINE HCL 50 MG/ML IJ SOLN
50.0000 mg | Freq: Once | INTRAMUSCULAR | Status: AC
Start: 2013-03-21 — End: 2013-03-21
  Administered 2013-03-21: 50 mg via INTRAVENOUS

## 2013-03-21 MED ORDER — SODIUM CHLORIDE 0.9 % IJ SOLN
10.0000 mL | INTRAMUSCULAR | Status: DC | PRN
  Administered 2013-03-21: 10 mL
  Filled 2013-03-21: qty 10

## 2013-03-21 MED ORDER — ONDANSETRON 8 MG/50ML IVPB (CHCC)
8.0000 mg | Freq: Once | INTRAVENOUS | Status: AC
  Administered 2013-03-21: 8 mg via INTRAVENOUS

## 2013-03-21 MED ORDER — DIPHENHYDRAMINE HCL 50 MG/ML IJ SOLN
INTRAMUSCULAR | Status: AC
  Filled 2013-03-21: qty 1

## 2013-03-21 MED ORDER — ONDANSETRON 8 MG/NS 50 ML IVPB
INTRAVENOUS | Status: AC
  Filled 2013-03-21: qty 8

## 2013-03-21 MED ORDER — HEPARIN SOD (PORK) LOCK FLUSH 100 UNIT/ML IV SOLN
500.0000 [IU] | Freq: Once | INTRAVENOUS | Status: AC | PRN
  Administered 2013-03-21: 500 [IU]
  Filled 2013-03-21: qty 5

## 2013-03-21 MED ORDER — SODIUM CHLORIDE 0.9 % IV SOLN
Freq: Once | INTRAVENOUS | Status: AC
  Administered 2013-03-21: 11:00:00 via INTRAVENOUS

## 2013-03-21 MED ORDER — DEXAMETHASONE SODIUM PHOSPHATE 20 MG/5ML IJ SOLN
20.0000 mg | Freq: Once | INTRAMUSCULAR | Status: AC
  Administered 2013-03-21: 20 mg via INTRAVENOUS

## 2013-03-21 MED ORDER — DEXAMETHASONE 4 MG PO TABS: ORAL_TABLET | ORAL | Status: DC

## 2013-03-21 MED ORDER — PACLITAXEL CHEMO INJECTION 300 MG/50ML
80.0000 mg/m2 | Freq: Once | INTRAVENOUS | Status: AC
  Administered 2013-03-21: 132 mg via INTRAVENOUS
  Filled 2013-03-21: qty 22

## 2013-03-21 NOTE — Telephone Encounter (Signed)
Per staff message I have adjusted 11/26 appt

## 2013-03-21 NOTE — Progress Notes (Signed)
OFFICE PROGRESS NOTE   03/21/2013   Physicians:ClarkePearson, Reuel Boom; Buckner Malta, Duane Lope, Erie Noe; Dahlstedt, [name]   INTERVAL HISTORY:  Patient is seen,  together with daughter, in continuing attention to serous gyn carcinoma for which she is receiving up front chemotherapy with dose dense taxol carboplatin, due day 8 cycle 3 today. She will have re-evaluation by gyn oncology on 04-11-13 and should have repeat CT shortly prior to that visit. She was transfused 2 units of PRBCs for symptomatic anemia on 03-07-13, which was very helpful. She has PAC and right JJ stent in.  She has had less appetite since day 1 cycle 3 carbo taxol on 03-14-13, tho she has continued to eat and to drink fluids. Output from the ileostomy is unchanged. She has not been vomiting. She has had some discomfort intermittently from the right ureteral stent, but no frank abdominal or pelvic pain. Knee high ted hose on RLE have helped swelling this week. She has had no fever or symptoms of infection. She has not had gross hematuria or other bleeding.  ONCOLOGIC HISTORY Patient has history of ulcerative colitis for which she had total colectomy with ileostomy in 1990, and subsequently difficult gyn exams. She had been generally stable until contracting norovirus in May 2014, with prolonged illness and weight loss then. She had yearly PE and first gyn exam by Dr Pennie Rushing in June 2014. In July 2014 she developed RLE swelling and pain, seen by PCP Dr Laury Axon with venous dopplers reportedly negative, however those symptoms persisted. She developed nausea, vomiting and temperature 101 by 8-16- 2014, with abdominal US 01-10-13 which showed mild right hydronephrosis. She was then admitted to [facility] from 8-20 thru 01-17-13; with evaluation including CT AP x2; consultations by Drs Retta Diones, Truett Perna, Cornett and ClarkePearson;right ureteral stent placement; and US biopsy of left inguinal lymph node. 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 Dr Yolande Jolly  recommended  dose dense taxol carboplatin, with possible interval debulking depending on response after ~ 3 cycles. Cycle 1 day 1 dose dense taxol carboplatin was given on 01-31-13; day 15 cycle 1 was held with platelets 49k. Carboplatin dose was decreased to AUC=4 with cycle 2 on 02-21-13; day 15 cycle 2 was held due to symptomatic anemia, transfusion done. Day 1 cycle 3 was given 03-14-13.   Review of systems as above, also: No peripheral neuropathy. Is able to sleep. No mucositis symptoms Remainder of 10 point Review of Systems negative.  Objective:  Vital signs in last 24 hours:  BP 103/67  Pulse 108  Temp(Src) 98.1 F (36.7 C) (Oral)  Resp 18  Wt 142 lb 6.4 oz (64.592 kg)  BMI 26.92 kg/m2 Weight is stable from last visit.  Alert, oriented and appropriate. Ambulatory without difficulty.  Alopecia  HEENT:PERRL, sclerae not icteric. Oral mucosa moist without lesions, posterior pharynx clear.  Neck supple. No JVD.  Lymphatics:no cervical,suraclavicular, axillary or inguinal adenopathy Resp: clear to auscultation bilaterally and normal percussion bilaterally Cardio: regular rate and rhythm. No gallop. GI: soft, nontender, not distended, no organomegaly. Normally active bowel sounds. Ileostomy not remarkable. Musculoskeletal/ Extremities: without pitting edema, cords, tenderness UE or LLE. RLE with knee high Teds, still 1-2+ swelling, no cords or tenderness Neuro: no peripheral neuropathy. Otherwise nonfocal. Psych as above Skin without rash, ecchymosis, petechiae Portacath-without erythema or tenderness  Lab Results:  Results for orders placed in visit on 03/21/13  CBC WITH DIFFERENTIAL      Result Value Range  WBC 5.1  3.9 - 10.3 10e3/uL   NEUT# 4.7  1.5 - 6.5 10e3/uL   HGB 11.4 (*) 11.6 - 15.9 g/dL   HCT 40.9 (*) 81.1 - 91.4 %   Platelets 304  145 - 400  10e3/uL   MCV 85.4  79.5 - 101.0 fL   MCH 29.2  25.1 - 34.0 pg   MCHC 34.2  31.5 - 36.0 g/dL   RBC 7.82  9.56 - 2.13 10e6/uL   RDW 16.6 (*) 11.2 - 14.5 %   lymph# 0.3 (*) 0.9 - 3.3 10e3/uL   MONO# 0.0 (*) 0.1 - 0.9 10e3/uL   Eosinophils Absolute 0.0  0.0 - 0.5 10e3/uL   Basophils Absolute 0.0  0.0 - 0.1 10e3/uL   NEUT% 92.7 (*) 38.4 - 76.8 %   LYMPH% 6.5 (*) 14.0 - 49.7 %   MONO% 0.8  0.0 - 14.0 %   EOS% 0.0  0.0 - 7.0 %   BASO% 0.0  0.0 - 2.0 %   nRBC 0  0 - 0 %  CA 125 from 03-07-13 down to 2700 from 4130 on 02-19-13.   Studies/Results:  No results found. Will need CT AP shortly prior to 04-11-13.  Medications: I have reviewed the patient's current medications. Premedication decadron refilled. Will give neupogen 300 mcg on 03-22-13 in attempt to keep ANC adequate to treat with day 15 this cycle.     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 She will have neupogen 10-30 and I will see her again 11-5 with day15 cycle 3 that day. Repeat CT and gyn oncology assessment after cycle 3, tho we will continue treatment on schedule during this restaging. 2.Ulcerative colitis: post total colectomy 1990 with ileostomy  3.Right ureteral obstruction from gyn malignancy, stent placed by Dr Retta Diones [phone], very difficult procedure. Dr Retta Diones requests to be notified when she has next CT.  4.multiple blood transfusions for ulcerative colitis problems previously, and PRBCs 2 units on 03-07-13 with good improvement in symptoms and tolerating present hemoglobin. Continue po iron.  5. PAC in by IR  6.flu vaccine done 7.hx HTN. I have requested that she hold antihypertensive if BP < ~ 120 to 130 systolic  8.rash to PCN    Patient and daughter are in agreement with plan above     Deshannon Hinchliffe P, MD   03/21/2013, 1:32 PM

## 2013-03-21 NOTE — Patient Instructions (Signed)
[facility] Discharge Instructions for Patients Receiving Chemotherapy  Today you received the following chemotherapy agents:  Taxol  To help prevent nausea and vomiting after your treatment, we encourage you to take your nausea medication as ordered per MD.   If you develop nausea and vomiting that is not controlled by your nausea medication, call the clinic.   BELOW ARE SYMPTOMS THAT SHOULD BE REPORTED IMMEDIATELY:  *FEVER GREATER THAN 100.5 F  *CHILLS WITH OR WITHOUT FEVER  NAUSEA AND VOMITING THAT IS NOT CONTROLLED WITH YOUR NAUSEA MEDICATION  *UNUSUAL SHORTNESS OF BREATH  *UNUSUAL BRUISING OR BLEEDING  TENDERNESS IN MOUTH AND THROAT WITH OR WITHOUT PRESENCE OF ULCERS  *URINARY PROBLEMS  *BOWEL PROBLEMS  UNUSUAL RASH Items with * indicate a potential emergency and should be followed up as soon as possible.  Feel free to call the clinic you have any questions or concerns. The clinic phone number is [phone].    

## 2013-03-21 NOTE — Patient Instructions (Signed)
neupogen shot tomorrow to keep WBC in good range hopefully for chemo again next week

## 2013-03-22 ENCOUNTER — Ambulatory Visit (HOSPITAL_BASED_OUTPATIENT_CLINIC_OR_DEPARTMENT_OTHER): Payer: BC Managed Care – PPO

## 2013-03-22 ENCOUNTER — Other Ambulatory Visit: Payer: Self-pay | Admitting: Oncology

## 2013-03-22 VITALS — BP 133/85 | HR 94 | Temp 98.4°F

## 2013-03-22 DIAGNOSIS — Z5189 Encounter for other specified aftercare: Secondary | ICD-10-CM

## 2013-03-22 DIAGNOSIS — C772 Secondary and unspecified malignant neoplasm of intra-abdominal lymph nodes: Secondary | ICD-10-CM

## 2013-03-22 DIAGNOSIS — C801 Malignant (primary) neoplasm, unspecified: Secondary | ICD-10-CM

## 2013-03-22 MED ORDER — FILGRASTIM 300 MCG/0.5ML IJ SOLN
300.0000 ug | Freq: Once | INTRAMUSCULAR | Status: AC
  Administered 2013-03-22: 300 ug via SUBCUTANEOUS
  Filled 2013-03-22: qty 0.5

## 2013-03-24 ENCOUNTER — Other Ambulatory Visit: Payer: Self-pay | Admitting: Oncology

## 2013-03-26 ENCOUNTER — Telehealth: Payer: Self-pay | Admitting: Oncology

## 2013-03-26 NOTE — Telephone Encounter (Signed)
sw pt verified Ct appt info shh

## 2013-03-28 ENCOUNTER — Other Ambulatory Visit (HOSPITAL_BASED_OUTPATIENT_CLINIC_OR_DEPARTMENT_OTHER): Payer: BC Managed Care – PPO | Admitting: Lab

## 2013-03-28 ENCOUNTER — Ambulatory Visit: Payer: BC Managed Care – PPO

## 2013-03-28 ENCOUNTER — Ambulatory Visit (HOSPITAL_BASED_OUTPATIENT_CLINIC_OR_DEPARTMENT_OTHER): Payer: BC Managed Care – PPO | Admitting: Oncology

## 2013-03-28 ENCOUNTER — Encounter: Payer: Self-pay | Admitting: Oncology

## 2013-03-28 VITALS — BP 130/76 | HR 114 | Temp 97.9°F | Resp 20 | Ht 61.0 in | Wt 142.7 lb

## 2013-03-28 DIAGNOSIS — C772 Secondary and unspecified malignant neoplasm of intra-abdominal lymph nodes: Secondary | ICD-10-CM

## 2013-03-28 DIAGNOSIS — C801 Malignant (primary) neoplasm, unspecified: Secondary | ICD-10-CM

## 2013-03-28 DIAGNOSIS — D6959 Other secondary thrombocytopenia: Secondary | ICD-10-CM

## 2013-03-28 LAB — CBC WITH DIFFERENTIAL/PLATELET
BASO%: 0 % (ref 0.0–2.0)
HCT: 29.1 % — ABNORMAL LOW (ref 34.8–46.6)
HGB: 10 g/dL — ABNORMAL LOW (ref 11.6–15.9)
LYMPH%: 16.3 % (ref 14.0–49.7)
MCH: 29.2 pg (ref 25.1–34.0)
MCHC: 34.4 g/dL (ref 31.5–36.0)
MONO#: 0.1 10*3/uL (ref 0.1–0.9)
NEUT#: 1.9 10*3/uL (ref 1.5–6.5)
NEUT%: 79.4 % — ABNORMAL HIGH (ref 38.4–76.8)
Platelets: 51 10*3/uL — ABNORMAL LOW (ref 145–400)
RDW: 16.3 % — ABNORMAL HIGH (ref 11.2–14.5)
WBC: 2.3 10*3/uL — ABNORMAL LOW (ref 3.9–10.3)

## 2013-03-28 NOTE — Progress Notes (Signed)
OFFICE PROGRESS NOTE   03/28/2013   Physicians:ClarkePearson, Reuel Boom; Buckner Malta, Duane Lope, Einar Crow, [name]   INTERVAL HISTORY:  Patient is seen, together with daughter, in continuing attention to neoadjuvant chemotherapy with dose dense taxol carbo for serous carcinoma of gyn origin. She had day 8 cycle 3 on 03-21-13 with neupogen on 10-30, however platelets are too low at 51k for day 15 cycle 3 today. She is having no bleeding. She had generalized aches on 11-1 and 11-2 from the neupogen, now resolved. Abdomen feels less full and is less tender since she has been on chemo. Ileostomy is functioning well.  She denies peripheral neuropathy. PAC is functioning well.   ONCOLOGIC HISTORY Patient has history of ulcerative colitis for which she had total colectomy with ileostomy in 1990, and subsequently difficult gyn exams. She had been generally stable until contracting norovirus in May 2014, with prolonged illness and weight loss then. She had yearly PE and first gyn exam by Dr Pennie Rushing in June 2014. In July 2014 she developed RLE swelling and pain, seen by PCP Dr Laury Axon with venous dopplers negative. She developed nausea, vomiting and fever, with abdominal US 01-10-13 which showed mild right hydronephrosis. She was then admitted to [facility] from 8-20 thru 01-17-13; with evaluation including CT AP x2; consultations by Drs Retta Diones, Truett Perna, Cornett and ClarkePearson;right ureteral stent placement; and US biopsy of left inguinal lymph node. 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 Dr Yolande Jolly recommended dose dense taxol carboplatin, with possible interval debulking depending on response after ~ 3 cycles. Cycle 1 day 1 dose dense taxol carboplatin was given on 01-31-13; day 15 cycle 1 was held with platelets 49k. Carboplatin dose was decreased to AUC=4 with cycle 2 on 02-21-13; day 15 cycle 2  was held due to symptomatic anemia, transfusion 2 units PRBCs on 03-07-13. Day 1 cycle 3 was given 03-14-13, day 8 on 10-29 + neupogen, and day 15 held 11-5 for platelets 51k.   Review of systems as above, also: No fever or symptoms of infection. Swelling lower right leg stable recently, better than on presentation, using TEDS, venous doppler previously negative for clot. No SOB or other symptoms from slightly lower hemoglobin. No gross hematuria.Ileostomy is functioning well. She can taste fruits and pickles only, but is pushing herself to eat and drink fluids well. Slept well last pm. Remainder of 10 point Review of Systems negative.  Objective:  Vital signs in last 24 hours:  BP 130/76  Pulse 114  Temp(Src) 97.9 F (36.6 C) (Oral)  Resp 20  Ht 5\' 1"  (1.549 m)  Wt 142 lb 11.2 oz (64.728 kg)  BMI 26.98 kg/m2  Weight stable Alert, oriented and appropriate. Ambulatory without difficulty.  Complete alopecia  HEENT:PERRL, sclerae not icteric. Oral mucosa moist without lesions, posterior pharynx clear.  Neck supple. No JVD.  Lymphatics:no cervical,suraclavicular, axillary or inguinal adenopathy Resp: clear to auscultation bilaterally and normal percussion bilaterally Cardio: regular rate and rhythm. No gallop. GI: soft, nontender, not distended, no HSM.  Bowel sounds present. Ileostomy not remarkable Musculoskeletal/ Extremities:RLE with 1-2+ swelling in calf with TED on, otherwise without pitting edema, cords, tenderness Neuro: no peripheral neuropathy. Psych as above Skin without rash, ecchymosis, petechiae. Portacath-without erythema or tenderness  Lab Results:  Results for orders placed in visit on 03/28/13  CBC WITH DIFFERENTIAL      Result Value Range   WBC 2.3 (*) 3.9 - 10.3 10e3/uL   NEUT# 1.9  1.5 - 6.5 10e3/uL   HGB 10.0 (*) 11.6 - 15.9 g/dL   HCT 16.1 (*) 09.6 - 04.5 %   Platelets 51 (*) 145 - 400 10e3/uL   MCV 85.1  79.5 - 101.0 fL   MCH 29.2  25.1 - 34.0 pg    MCHC 34.4  31.5 - 36.0 g/dL   RBC 4.09 (*) 8.11 - 9.14 10e6/uL   RDW 16.3 (*) 11.2 - 14.5 %   lymph# 0.4 (*) 0.9 - 3.3 10e3/uL   MONO# 0.1  0.1 - 0.9 10e3/uL   Eosinophils Absolute 0.0  0.0 - 0.5 10e3/uL   Basophils Absolute 0.0  0.0 - 0.1 10e3/uL   NEUT% 79.4 (*) 38.4 - 76.8 %   LYMPH% 16.3  14.0 - 49.7 %   MONO% 4.3  0.0 - 14.0 %   EOS% 0.0  0.0 - 7.0 %   BASO% 0.0  0.0 - 2.0 %    CA 125 on 03-07-13 was 2700, this having been 5626 in August.  Studies/Results:  No results found.  Medications: I have reviewed the patient's current medications. She is taking no ASA or NSAID.     Assessment/Plan: 1. 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 Repeat CT 11-18 and Dr Yolande Jolly to see 11-19. We will continue treatment with day 1 cycle 4 on 11-12 if counts allow, with decrease in carbo to AUC = 4 and neupogen if so. 2.Ulcerative colitis: post total colectomy 1990 with ileostomy  3.Right ureteral obstruction from gyn malignancy, stent placed by Dr Retta Diones [phone], very difficult procedure. Dr Retta Diones has asked to review upcoming CT.  4.multiple blood transfusions for ulcerative colitis problems previously, and PRBCs 2 units on 03-07-13 with good improvement in symptoms and tolerating present hemoglobin. Continue po iron.  5. PAC in  6.flu vaccine done  7.hx HTN.   8.rash to PCN  I will see her 11-10 with repeat CBC,CMET, CA125. She will call if significant bleeding.      [name],LENNIS P, MD   03/28/2013, 8:39 AM

## 2013-03-29 ENCOUNTER — Telehealth: Payer: Self-pay | Admitting: Oncology

## 2013-03-29 NOTE — Telephone Encounter (Signed)
, °

## 2013-03-30 ENCOUNTER — Other Ambulatory Visit (HOSPITAL_BASED_OUTPATIENT_CLINIC_OR_DEPARTMENT_OTHER): Payer: BC Managed Care – PPO

## 2013-03-30 ENCOUNTER — Ambulatory Visit: Payer: BC Managed Care – PPO

## 2013-03-30 VITALS — BP 112/63 | HR 94 | Temp 97.9°F | Resp 18

## 2013-03-30 DIAGNOSIS — C801 Malignant (primary) neoplasm, unspecified: Secondary | ICD-10-CM

## 2013-03-30 DIAGNOSIS — C772 Secondary and unspecified malignant neoplasm of intra-abdominal lymph nodes: Secondary | ICD-10-CM

## 2013-03-30 DIAGNOSIS — Z95828 Presence of other vascular implants and grafts: Secondary | ICD-10-CM

## 2013-03-30 LAB — CBC WITH DIFFERENTIAL/PLATELET
BASO%: 0.3 % (ref 0.0–2.0)
EOS%: 0.2 % (ref 0.0–7.0)
HCT: 28.4 % — ABNORMAL LOW (ref 34.8–46.6)
LYMPH%: 26.4 % (ref 14.0–49.7)
MCH: 29.3 pg (ref 25.1–34.0)
MCHC: 33.6 g/dL (ref 31.5–36.0)
MONO#: 0.6 10*3/uL (ref 0.1–0.9)
MONO%: 14.8 % — ABNORMAL HIGH (ref 0.0–14.0)
NEUT%: 58.3 % (ref 38.4–76.8)
Platelets: 78 10*3/uL — ABNORMAL LOW (ref 145–400)
RBC: 3.25 10*6/uL — ABNORMAL LOW (ref 3.70–5.45)
lymph#: 1 10*3/uL (ref 0.9–3.3)

## 2013-03-30 LAB — COMPREHENSIVE METABOLIC PANEL (CC13)
ALT: 19 U/L (ref 0–55)
AST: 16 U/L (ref 5–34)
Alkaline Phosphatase: 65 U/L (ref 40–150)
CO2: 25 mEq/L (ref 22–29)
Calcium: 9.7 mg/dL (ref 8.4–10.4)
Creatinine: 0.6 mg/dL (ref 0.6–1.1)
Potassium: 3.4 mEq/L — ABNORMAL LOW (ref 3.5–5.1)
Total Bilirubin: 0.36 mg/dL (ref 0.20–1.20)
Total Protein: 6.3 g/dL — ABNORMAL LOW (ref 6.4–8.3)

## 2013-03-30 MED ORDER — SODIUM CHLORIDE 0.9 % IJ SOLN
10.0000 mL | INTRAMUSCULAR | Status: DC | PRN
  Administered 2013-03-30: 10 mL via INTRAVENOUS
  Filled 2013-03-30: qty 10

## 2013-03-30 MED ORDER — HEPARIN SOD (PORK) LOCK FLUSH 100 UNIT/ML IV SOLN
500.0000 [IU] | Freq: Once | INTRAVENOUS | Status: AC
  Administered 2013-03-30: 500 [IU] via INTRAVENOUS
  Filled 2013-03-30: qty 5

## 2013-03-30 NOTE — Patient Instructions (Signed)
Implanted Port Instructions  An implanted port is a central line that has a round shape and is placed under the skin. It is used for long-term IV (intravenous) access for:  · Medicine.  · Fluids.  · Liquid nutrition, such as TPN (total parenteral nutrition).  · Blood samples.  Ports can be placed:  · In the chest area just below the collarbone (this is the most common place.)  · In the arms.  · In the belly (abdomen) area.  · In the legs.  PARTS OF THE PORT  A port has 2 main parts:  · The reservoir. The reservoir is round, disc-shaped, and will be a small, raised area under your skin.  · The reservoir is the part where a needle is inserted (accessed) to either give medicines or to draw blood.  · The catheter. The catheter is a long, slender tube that extends from the reservoir. The catheter is placed into a large vein.  · Medicine that is inserted into the reservoir goes into the catheter and then into the vein.  INSERTION OF THE PORT  · The port is surgically placed in either an operating room or in a procedural area (interventional radiology).  · Medicine may be given to help you relax during the procedure.  · The skin where the port will be inserted is numbed (local anesthetic).  · 1 or 2 small cuts (incisions) will be made in the skin to insert the port.  · The port can be used after it has been inserted.  INCISION SITE CARE  · The incision site may have small adhesive strips on it. This helps keep the incision site closed. Sometimes, no adhesive strips are placed. Instead of adhesive strips, a special kind of surgical glue is used to keep the incision closed.  · If adhesive strips were placed on the incision sites, do not take them off. They will fall off on their own.  · The incision site may be sore for 1 to 2 days. Pain medicine can help.  · Do not get the incision site wet. Bathe or shower as directed by your caregiver.  · The incision site should heal in 5 to 7 days. A small scar may form after the  incision has healed.  ACCESSING THE PORT  Special steps must be taken to access the port:  · Before the port is accessed, a numbing cream can be placed on the skin. This helps numb the skin over the port site.  · A sterile technique is used to access the port.  · The port is accessed with a needle. Only "non-coring" port needles should be used to access the port. Once the port is accessed, a blood return should be checked. This helps ensure the port is in the vein and is not clogged (clotted).  · If your caregiver believes your port should remain accessed, a clear (transparent) bandage will be placed over the needle site. The bandage and needle will need to be changed every week or as directed by your caregiver.  · Keep the bandage covering the needle clean and dry. Do not get it wet. Follow your caregiver's instructions on how to take a shower or bath when the port is accessed.  · If your port does not need to stay accessed, no bandage is needed over the port.  FLUSHING THE PORT  Flushing the port keeps it from getting clogged. How often the port is flushed depends on:  · If a   constant infusion is running. If a constant infusion is running, the port may not need to be flushed.  · If intermittent medicines are given.  · If the port is not being used.  For intermittent medicines:  · The port will need to be flushed:  · After medicines have been given.  · After blood has been drawn.  · As part of routine maintenance.  · A port is normally flushed with:  · Normal saline.  · Heparin.  · Follow your caregiver's advice on how often, how much, and the type of flush to use on your port.  IMPORTANT PORT INFORMATION  · Tell your caregiver if you are allergic to heparin.  · After your port is placed, you will get a manufacturer's information card. The card has information about your port. Keep this card with you at all times.  · There are many types of ports available. Know what kind of port you have.  · In case of an  emergency, it may be helpful to wear a medical alert bracelet. This can help alert health care workers that you have a port.  · The port can stay in for as long as your caregiver believes it is necessary.  · When it is time for the port to come out, surgery will be done to remove it. The surgery will be similar to how the port was put in.  · If you are in the hospital or clinic:  · Your port will be taken care of and flushed by a nurse.  · If you are at home:  · A home health care nurse may give medicines and take care of the port.  · You or a family member can get special training and directions for giving medicine and taking care of the port at home.  SEEK IMMEDIATE MEDICAL CARE IF:   · Your port does not flush or you are unable to get a blood return.  · New drainage or pus is coming from the incision.  · A bad smell is coming from the incision site.  · You develop swelling or increased redness at the incision site.  · You develop increased swelling or pain at the port site.  · You develop swelling or pain in the surrounding skin near the port.  · You have an oral temperature above 102° F (38.9° C), not controlled by medicine.  MAKE SURE YOU:   · Understand these instructions.  · Will watch your condition.  · Will get help right away if you are not doing well or get worse.  Document Released: 05/10/2005 Document Revised: 08/02/2011 Document Reviewed: 08/01/2008  [employer]® Patient Information ©2014 [employer].

## 2013-04-01 ENCOUNTER — Other Ambulatory Visit: Payer: Self-pay | Admitting: Oncology

## 2013-04-01 DIAGNOSIS — C772 Secondary and unspecified malignant neoplasm of intra-abdominal lymph nodes: Secondary | ICD-10-CM

## 2013-04-02 ENCOUNTER — Other Ambulatory Visit (HOSPITAL_BASED_OUTPATIENT_CLINIC_OR_DEPARTMENT_OTHER): Payer: BC Managed Care – PPO

## 2013-04-02 ENCOUNTER — Telehealth: Payer: Self-pay | Admitting: Oncology

## 2013-04-02 ENCOUNTER — Encounter: Payer: Self-pay | Admitting: Oncology

## 2013-04-02 ENCOUNTER — Ambulatory Visit (HOSPITAL_BASED_OUTPATIENT_CLINIC_OR_DEPARTMENT_OTHER): Payer: BC Managed Care – PPO | Admitting: Oncology

## 2013-04-02 ENCOUNTER — Other Ambulatory Visit: Payer: BC Managed Care – PPO | Admitting: Lab

## 2013-04-02 VITALS — BP 122/83 | HR 120 | Temp 98.4°F | Resp 19 | Ht 61.0 in | Wt 141.2 lb

## 2013-04-02 DIAGNOSIS — C772 Secondary and unspecified malignant neoplasm of intra-abdominal lymph nodes: Secondary | ICD-10-CM

## 2013-04-02 DIAGNOSIS — C801 Malignant (primary) neoplasm, unspecified: Secondary | ICD-10-CM

## 2013-04-02 DIAGNOSIS — K5190 Ulcerative colitis, unspecified, without complications: Secondary | ICD-10-CM

## 2013-04-02 LAB — CBC WITH DIFFERENTIAL/PLATELET
BASO%: 0.4 % (ref 0.0–2.0)
EOS%: 0.2 % (ref 0.0–7.0)
MCH: 29.1 pg (ref 25.1–34.0)
MCHC: 33.2 g/dL (ref 31.5–36.0)
MONO#: 0.6 10*3/uL (ref 0.1–0.9)
NEUT#: 3.1 10*3/uL (ref 1.5–6.5)
NEUT%: 65.9 % (ref 38.4–76.8)
RBC: 3.44 10*6/uL — ABNORMAL LOW (ref 3.70–5.45)
RDW: 17.9 % — ABNORMAL HIGH (ref 11.2–14.5)
WBC: 4.7 10*3/uL (ref 3.9–10.3)
lymph#: 1 10*3/uL (ref 0.9–3.3)
nRBC: 0 % (ref 0–0)

## 2013-04-02 NOTE — Telephone Encounter (Signed)
Medical Oncology  Spoke with patient by phone now to let her know counts good for chemo as planned 04-04-13. Patient appreciated call.  Kirsten Mcgill, MD

## 2013-04-02 NOTE — Patient Instructions (Signed)
Continue with appointments as scheduled. We'll let you know about CBC today

## 2013-04-02 NOTE — Progress Notes (Signed)
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 Last chemo was day 8 cycle 3 on 10-29, with day 15 cycle 3 held the following week due to platelets 51k. She is for day 1 cycle 4 on 04-04-13 as long as counts pending today are adequate. Other than cytopenias, which have necessitated holding day 15 treatments each cycle, she has tolerated the chemotherapy well overall and can tell improvement in abdominal discomfort. CA 125 drawn 03-30-13 was 1775, this having been 2700 on 10-15 and 5626 on 01-16-13. She is for restaging CT on 04-10-13 and will see Dr Yolande Jolly on 04-11-13.  Patient continues very motivated to improve situation, including good diet choices even with taste abnormalities and decreased appetite. Ileostomy function is at baseline. Her abdomen is less sore than prior to chemotherapy. She is not vomiting. She has not had gross hematuria or other bleeding. She has PAC and right JJ stent.   ONCOLOGIC HISTORY Patient has history of ulcerative colitis for which she had total colectomy with ileostomy in 1990, and subsequently difficult gyn exams. She had been generally stable until contracting norovirus in May 2014, with prolonged illness and weight loss then. She had yearly PE and first gyn exam by Dr Pennie Rushing in June 2014. In July 2014 she developed RLE swelling and pain, seen by PCP Dr Laury Axon with venous dopplers negative. She developed nausea, vomiting and fever, with abdominal US 01-10-13 which showed mild right hydronephrosis. She was then admitted to [facility] from 8-20 thru 01-17-13; with evaluation including CT AP x2; consultations by Drs Retta Diones, Truett Perna, Cornett and ClarkePearson;right ureteral stent placement;  and US biopsy of left inguinal lymph node. Pathology [phone] carcinoma with immunohistochemical stains consistent with serous carcinoma of gyn origin. Dr Yolande Jolly recommended dose dense taxol carboplatin, with possible interval debulking depending on response after ~ 3 cycles. Cycle 1 day 1 dose dense taxol carboplatin was given on 01-31-13; day 15 cycle 1 was held with platelets 49k. Carboplatin dose was decreased to AUC=4 with cycle 2 on 02-21-13; day 15 cycle 2 was held due to symptomatic anemia, transfusion 2 units PRBCs on 03-07-13. Day 1 cycle 3 was given 03-14-13, day 8 on 10-29 + neupogen, and day 15 held 11-5 for platelets 51k.   Review of systems as above, also: No fever or symptoms of infection. No increased SOB or other respiratory symptoms. Improvement in RLE swelling, still not completely resolved. Remainder of 10 point Review of Systems negative.  Objective:  Vital signs in last 24 hours:  BP 122/83  Pulse 120  Temp(Src) 98.4 F (36.9 C) (Oral)  Resp 19  Ht 5\' 1"  (1.549 m)  Wt 141 lb 3.2 oz (64.048 kg)  BMI 26.69 kg/m2  Weight is down ~ 1 lb from 03-28-13.  Alert, oriented and appropriate. Ambulatory without assistance. Looks somewhat pale, not icteric,chronically ill but NAD. Total alopecia  HEENT:PERRL, sclerae not icteric. Oral mucosa moist without lesions, posterior pharynx clear.  Neck supple. No JVD.  Lymphatics:no cervical,suraclavicular, axillary or inguinal adenopathy Resp: clear to auscultation bilaterally and normal percussion bilaterally Cardio: regular rate and rhythm. No gallop. GI: soft, nontender, not distended, no organomegaly. Present bowel sounds. Ileostomy not remarkable Musculoskeletal/ Extremities: without pitting edema, cords, tenderness UE or LLE, 1-2+ swelling right calf with knee hi  Teds on, this decreased from last visit. Neuro: no peripheral neuropathy. Otherwise nonfocal. Psych as above Skin without rash, ecchymosis, petechiae   Portacath-without erythema or tenderness  Lab Results: Labs here from 03-30-13: Results for orders placed in visit on 03/30/13  CBC WITH DIFFERENTIAL      Result Value Range   WBC 3.8 (*) 3.9 - 10.3 10e3/uL   NEUT# 2.2  1.5 - 6.5 10e3/uL   HGB 9.5 (*) 11.6 - 15.9 g/dL   HCT 40.9 (*) 81.1 - 91.4 %   Platelets 78 (*) 145 - 400 10e3/uL   MCV 87.2  79.5 - 101.0 fL   MCH 29.3  25.1 - 34.0 pg   MCHC 33.6  31.5 - 36.0 g/dL   RBC 7.82 (*) 9.56 - 2.13 10e6/uL   RDW 19.0 (*) 11.2 - 14.5 %   lymph# 1.0  0.9 - 3.3 10e3/uL   MONO# 0.6  0.1 - 0.9 10e3/uL   Eosinophils Absolute 0.0  0.0 - 0.5 10e3/uL   Basophils Absolute 0.0  0.0 - 0.1 10e3/uL   NEUT% 58.3  38.4 - 76.8 %   LYMPH% 26.4  14.0 - 49.7 %   MONO% 14.8 (*) 0.0 - 14.0 %   EOS% 0.2  0.0 - 7.0 %   BASO% 0.3  0.0 - 2.0 %  COMPREHENSIVE METABOLIC PANEL (CC13)      Result Value Range   Sodium 137  136 - 145 mEq/L   Potassium 3.4 (*) 3.5 - 5.1 mEq/L   Chloride 100  98 - 109 mEq/L   CO2 25  22 - 29 mEq/L   Glucose 108  70 - 140 mg/dl   BUN 08.6  7.0 - 57.8 mg/dL   Creatinine 0.6  0.6 - 1.1 mg/dL   Total Bilirubin 4.69  0.20 - 1.20 mg/dL   Alkaline Phosphatase 65  40 - 150 U/L   AST 16  5 - 34 U/L   ALT 19  0 - 55 U/L   Total Protein 6.3 (*) 6.4 - 8.3 g/dL   Albumin 2.9 (*) 3.5 - 5.0 g/dL   Calcium 9.7  8.4 - 62.9 mg/dL   Anion Gap 12 (*) 3 - 11 mEq/L  CA 125      Result Value Range   CA 125 1777.5 (*) 0.0 - 30.2 U/mL    CBC today available after visit and communicated to patient by phone: WBC 4.7, ANC 3.1, Hgb 10 and plt 111k  Studies/Results:  No results found.  She will have restaging CT 11-18  Medications: I have reviewed the patient's current medications.  Carboplatin dose for day 1 cycle 4 planned 04-04-13 again reduced slightly due to previous thrombocytopenia and platelets just over 100k today.   Assessment/Plan:  1. serous gyn carcinoma: large pelvic mass with right ureteral obstruction, with small bowel fistula  into the mass: clinical improvement with neoadjuvant chemo thus far, tho counts have not tolerated treatment well. Repeat CT 11-18 and Dr Yolande Jolly to see 11-19. We will continue treatment with day 1 cycle 4 on 11-12, with decrease in [place] and neupogen days 2,8,16.  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 Dr Retta Diones has asked to review upcoming CT.  4.multiple blood transfusions for ulcerative colitis problems previously, and PRBCs 2 units on 03-07-13 with good improvement in symptoms and tolerating present hemoglobin. Continue po iron.  5. PAC in  6.flu vaccine  done  7.hx HTN.  8.rash to PCN    Patient and daughter are in agreement with plan.   Per Beagley P, MD   04/02/2013, 9:03 AM

## 2013-04-03 ENCOUNTER — Other Ambulatory Visit: Payer: Self-pay | Admitting: Oncology

## 2013-04-03 ENCOUNTER — Telehealth: Payer: Self-pay

## 2013-04-03 NOTE — Telephone Encounter (Signed)
Told Ms. Macrae the information noted below by Dr. Alessandra Bevels regarding the need for neupogen injections.

## 2013-04-03 NOTE — Telephone Encounter (Signed)
Message copied by Lorine Bears on Tue Apr 03, 2013  6:07 PM ------      Message from: Reece Packer      Created: Tue Apr 03, 2013  1:46 PM       She needs injections (neupogen) 11-13, 11-20 and 11-27. POF sent now.      She does not know I have added these, so need to be sure she is aware.  Thanks            Cc LA, Jacquelin Hawking ------

## 2013-04-04 ENCOUNTER — Ambulatory Visit (HOSPITAL_BASED_OUTPATIENT_CLINIC_OR_DEPARTMENT_OTHER): Payer: BC Managed Care – PPO

## 2013-04-04 ENCOUNTER — Other Ambulatory Visit (HOSPITAL_BASED_OUTPATIENT_CLINIC_OR_DEPARTMENT_OTHER): Payer: BC Managed Care – PPO | Admitting: Lab

## 2013-04-04 ENCOUNTER — Telehealth: Payer: Self-pay | Admitting: *Deleted

## 2013-04-04 DIAGNOSIS — C801 Malignant (primary) neoplasm, unspecified: Secondary | ICD-10-CM

## 2013-04-04 DIAGNOSIS — C772 Secondary and unspecified malignant neoplasm of intra-abdominal lymph nodes: Secondary | ICD-10-CM

## 2013-04-04 DIAGNOSIS — Z5111 Encounter for antineoplastic chemotherapy: Secondary | ICD-10-CM

## 2013-04-04 LAB — CBC WITH DIFFERENTIAL/PLATELET
Basophils Absolute: 0 10*3/uL (ref 0.0–0.1)
EOS%: 0 % (ref 0.0–7.0)
Eosinophils Absolute: 0 10*3/uL (ref 0.0–0.5)
MCHC: 33.6 g/dL (ref 31.5–36.0)
MCV: 87.5 fL (ref 79.5–101.0)
MONO%: 1 % (ref 0.0–14.0)
NEUT#: 5.3 10*3/uL (ref 1.5–6.5)
Platelets: 186 10*3/uL (ref 145–400)
RBC: 3.27 10*6/uL — ABNORMAL LOW (ref 3.70–5.45)
RDW: 17.9 % — ABNORMAL HIGH (ref 11.2–14.5)
nRBC: 0 % (ref 0–0)

## 2013-04-04 MED ORDER — ONDANSETRON 16 MG/50ML IVPB (CHCC)
INTRAVENOUS | Status: AC
  Filled 2013-04-04: qty 16

## 2013-04-04 MED ORDER — FAMOTIDINE IN NACL 20-0.9 MG/50ML-% IV SOLN
20.0000 mg | Freq: Once | INTRAVENOUS | Status: AC
  Administered 2013-04-04: 20 mg via INTRAVENOUS

## 2013-04-04 MED ORDER — FAMOTIDINE IN NACL 20-0.9 MG/50ML-% IV SOLN
INTRAVENOUS | Status: AC
  Filled 2013-04-04: qty 50

## 2013-04-04 MED ORDER — ONDANSETRON 16 MG/50ML IVPB (CHCC)
16.0000 mg | Freq: Once | INTRAVENOUS | Status: AC
  Administered 2013-04-04: 16 mg via INTRAVENOUS

## 2013-04-04 MED ORDER — DIPHENHYDRAMINE HCL 50 MG/ML IJ SOLN
INTRAMUSCULAR | Status: AC
  Filled 2013-04-04: qty 1

## 2013-04-04 MED ORDER — DEXAMETHASONE SODIUM PHOSPHATE 20 MG/5ML IJ SOLN
INTRAMUSCULAR | Status: AC
  Filled 2013-04-04: qty 5

## 2013-04-04 MED ORDER — SODIUM CHLORIDE 0.9 % IV SOLN
510.0000 mg | Freq: Once | INTRAVENOUS | Status: AC
  Administered 2013-04-04: 510 mg via INTRAVENOUS
  Filled 2013-04-04: qty 51

## 2013-04-04 MED ORDER — PACLITAXEL CHEMO INJECTION 300 MG/50ML
80.0000 mg/m2 | Freq: Once | INTRAVENOUS | Status: AC
  Administered 2013-04-04: 132 mg via INTRAVENOUS
  Filled 2013-04-04: qty 22

## 2013-04-04 MED ORDER — DEXAMETHASONE SODIUM PHOSPHATE 20 MG/5ML IJ SOLN
20.0000 mg | Freq: Once | INTRAMUSCULAR | Status: AC
  Administered 2013-04-04: 20 mg via INTRAVENOUS

## 2013-04-04 MED ORDER — SODIUM CHLORIDE 0.9 % IJ SOLN
10.0000 mL | INTRAMUSCULAR | Status: DC | PRN
  Administered 2013-04-04: 10 mL
  Filled 2013-04-04: qty 10

## 2013-04-04 MED ORDER — HEPARIN SOD (PORK) LOCK FLUSH 100 UNIT/ML IV SOLN
500.0000 [IU] | Freq: Once | INTRAVENOUS | Status: AC | PRN
  Administered 2013-04-04: 500 [IU]
  Filled 2013-04-04: qty 5

## 2013-04-04 MED ORDER — SODIUM CHLORIDE 0.9 % IV SOLN
Freq: Once | INTRAVENOUS | Status: AC
  Administered 2013-04-04: 13:00:00 via INTRAVENOUS

## 2013-04-04 MED ORDER — DIPHENHYDRAMINE HCL 50 MG/ML IJ SOLN
50.0000 mg | Freq: Once | INTRAMUSCULAR | Status: AC
  Administered 2013-04-04: 50 mg via INTRAVENOUS

## 2013-04-04 NOTE — Patient Instructions (Addendum)
[facility] Discharge Instructions for Patients Receiving Chemotherapy  Today you received the following chemotherapy agents: taxol, carboplatin  To help prevent nausea and vomiting after your treatment, we encourage you to take your nausea medication.  Take it as often as prescribed.     If you develop nausea and vomiting that is not controlled by your nausea medication, call the clinic. If it is after clinic hours your family physician or the after hours number for the clinic or go to the Emergency Department.   BELOW ARE SYMPTOMS THAT SHOULD BE REPORTED IMMEDIATELY:  *FEVER GREATER THAN 100.5 F  *CHILLS WITH OR WITHOUT FEVER  NAUSEA AND VOMITING THAT IS NOT CONTROLLED WITH YOUR NAUSEA MEDICATION  *UNUSUAL SHORTNESS OF BREATH  *UNUSUAL BRUISING OR BLEEDING  TENDERNESS IN MOUTH AND THROAT WITH OR WITHOUT PRESENCE OF ULCERS  *URINARY PROBLEMS  *BOWEL PROBLEMS  UNUSUAL RASH Items with * indicate a potential emergency and should be followed up as soon as possible.  Feel free to call the clinic you have any questions or concerns. The clinic phone number is [phone].   I have been informed and understand all the instructions given to me. I know to contact the clinic, my physician, or go to the Emergency Department if any problems should occur. I do not have any questions at this time, but understand that I may call the clinic during office hours   should I have any questions or need assistance in obtaining follow up care.    __________________________________________  _____________  __________ Signature of Patient or Authorized Representative            Date                   Time    __________________________________________ Nurse's Signature    

## 2013-04-04 NOTE — Telephone Encounter (Signed)
appts made and printed...td 

## 2013-04-05 ENCOUNTER — Ambulatory Visit (HOSPITAL_BASED_OUTPATIENT_CLINIC_OR_DEPARTMENT_OTHER): Payer: BC Managed Care – PPO

## 2013-04-05 VITALS — BP 135/77 | HR 100 | Temp 98.4°F

## 2013-04-05 DIAGNOSIS — Z5189 Encounter for other specified aftercare: Secondary | ICD-10-CM

## 2013-04-05 DIAGNOSIS — C801 Malignant (primary) neoplasm, unspecified: Secondary | ICD-10-CM

## 2013-04-05 DIAGNOSIS — C772 Secondary and unspecified malignant neoplasm of intra-abdominal lymph nodes: Secondary | ICD-10-CM

## 2013-04-05 MED ORDER — FILGRASTIM 300 MCG/0.5ML IJ SOLN
300.0000 ug | Freq: Once | INTRAMUSCULAR | Status: AC
  Administered 2013-04-05: 300 ug via SUBCUTANEOUS
  Filled 2013-04-05: qty 0.5

## 2013-04-05 NOTE — Patient Instructions (Signed)
Filgrastim, G-CSF injection What is this medicine? FILGRASTIM, G-CSF (fil GRA stim) stimulates the formation of white blood cells. This medicine is given to patients with conditions that may cause a decrease in white blood cells, like those receiving certain types of chemotherapy or bone marrow transplant. It helps the bone marrow recover its ability to produce white blood cells. Increasing the amount of white blood cells helps to decrease the risk of infection and fever. This medicine may be used for other purposes; ask your health care provider or pharmacist if you have questions. COMMON BRAND NAME(S): Neupogen What should I tell my health care provider before I take this medicine? They need to know if you have any of these conditions: -currently receiving radiation therapy -sickle cell disease -an unusual or allergic reaction to filgrastim, E. coli protein, other medicines, foods, dyes, or preservatives -pregnant or trying to get pregnant -breast-feeding How should I use this medicine? This medicine is for injection into a vein or injection under the skin. It is usually given by a health care professional in a hospital or clinic setting. If you get this medicine at home, you will be taught how to prepare and give this medicine. Always change the site for the injection under the skin. Let the solution warm to room temperature before you use it. Do not shake the solution before you withdraw a dose. Throw away any unused portion. Use exactly as directed. Take your medicine at regular intervals. Do not take your medicine more often than directed. It is important that you put your used needles and syringes in a special sharps container. Do not put them in a trash can. If you do not have a sharps container, call your pharmacist or healthcare provider to get one. Talk to your pediatrician regarding the use of this medicine in children. While this medicine may be prescribed for children for selected  conditions, precautions do apply. Overdosage: If you think you have taken too much of this medicine contact a poison control center or emergency room at once. NOTE: This medicine is only for you. Do not share this medicine with others. What if I miss a dose? Try not to miss doses. If you miss a dose take the dose as soon as you remember. If it is almost time for the next dose, do not take double doses unless told to by your doctor or health care professional. What may interact with this medicine? -lithium -medicines for cancer chemotherapy This list may not describe all possible interactions. Give your health care provider a list of all the medicines, herbs, non-prescription drugs, or dietary supplements you use. Also tell them if you smoke, drink alcohol, or use illegal drugs. Some items may interact with your medicine. What should I watch for while using this medicine? Visit your doctor or health care professional for regular checks on your progress. If you get a fever or any sign of infection while you are using this medicine, do not treat yourself. Check with your doctor or health care professional. Bone pain can usually be relieved by mild pain relievers such as acetaminophen or ibuprofen. Check with your doctor or health care professional before taking these medicines as they may hide a fever. Call your doctor or health care professional if the aches and pains are severe or do not go away. What side effects may I notice from receiving this medicine? Side effects that you should report to your doctor or health care professional as soon as possible: -allergic reactions   like skin rash, itching or hives, swelling of the face, lips, or tongue -difficulty breathing, wheezing -fever -pain, redness, or swelling at the injection site -stomach or side pain, or pain at the shoulder Side effects that usually do not require medical attention (report to your doctor or health care professional if they  continue or are bothersome): -bone pain (ribs, lower back, breast bone) -headache -skin rash This list may not describe all possible side effects. Call your doctor for medical advice about side effects. You may report side effects to FDA at 1-800-FDA-1088. Where should I keep my medicine? Keep out of the reach of children. Store in a refrigerator between 2 and 8 degrees C (36 and 46 degrees F). Do not freeze or leave in direct sunlight. If vials or syringes are left out of the refrigerator for more than 24 hours, they must be thrown away. Throw away unused vials after the expiration date on the carton. NOTE: This sheet is a summary. It may not cover all possible information. If you have questions about this medicine, talk to your doctor, pharmacist, or health care provider.  2014, [employer]vier/Gold Standard. (2007-07-26 13:33:21)  

## 2013-04-08 ENCOUNTER — Other Ambulatory Visit: Payer: Self-pay | Admitting: Oncology

## 2013-04-09 ENCOUNTER — Telehealth: Payer: Self-pay | Admitting: Oncology

## 2013-04-09 NOTE — Telephone Encounter (Signed)
, °

## 2013-04-10 ENCOUNTER — Ambulatory Visit (HOSPITAL_COMMUNITY)
Admission: RE | Admit: 2013-04-10 | Discharge: 2013-04-10 | Disposition: A | Discharge status: Home / Self Care | Payer: BC Managed Care – PPO | Source: Ambulatory Visit | Attending: Oncology | Admitting: Oncology

## 2013-04-10 ENCOUNTER — Ambulatory Visit (HOSPITAL_COMMUNITY): Payer: BC Managed Care – PPO

## 2013-04-10 ENCOUNTER — Encounter (HOSPITAL_COMMUNITY): Payer: Self-pay

## 2013-04-10 DIAGNOSIS — Z932 Ileostomy status: Secondary | ICD-10-CM | POA: Insufficient documentation

## 2013-04-10 DIAGNOSIS — C801 Malignant (primary) neoplasm, unspecified: Secondary | ICD-10-CM | POA: Insufficient documentation

## 2013-04-10 DIAGNOSIS — Z79899 Other long term (current) drug therapy: Secondary | ICD-10-CM | POA: Insufficient documentation

## 2013-04-10 DIAGNOSIS — C772 Secondary and unspecified malignant neoplasm of intra-abdominal lymph nodes: Secondary | ICD-10-CM | POA: Insufficient documentation

## 2013-04-10 DIAGNOSIS — IMO0002 Reserved for concepts with insufficient information to code with codable children: Secondary | ICD-10-CM | POA: Insufficient documentation

## 2013-04-10 MED ORDER — IOHEXOL 300 MG/ML  SOLN
100.0000 mL | Freq: Once | INTRAMUSCULAR | Status: AC | PRN
  Administered 2013-04-10: 100 mL via INTRAVENOUS

## 2013-04-11 ENCOUNTER — Other Ambulatory Visit (HOSPITAL_BASED_OUTPATIENT_CLINIC_OR_DEPARTMENT_OTHER): Payer: BC Managed Care – PPO | Admitting: Lab

## 2013-04-11 ENCOUNTER — Ambulatory Visit (HOSPITAL_BASED_OUTPATIENT_CLINIC_OR_DEPARTMENT_OTHER): Payer: BC Managed Care – PPO

## 2013-04-11 ENCOUNTER — Ambulatory Visit: Payer: BC Managed Care – PPO | Attending: Gynecology | Admitting: Gynecology

## 2013-04-11 ENCOUNTER — Encounter: Payer: Self-pay | Admitting: Gynecology

## 2013-04-11 VITALS — BP 109/69 | HR 108 | Temp 98.3°F

## 2013-04-11 VITALS — BP 119/73 | HR 116 | Temp 98.6°F | Resp 18 | Ht 61.0 in | Wt 140.7 lb

## 2013-04-11 DIAGNOSIS — R599 Enlarged lymph nodes, unspecified: Secondary | ICD-10-CM | POA: Insufficient documentation

## 2013-04-11 DIAGNOSIS — Z79899 Other long term (current) drug therapy: Secondary | ICD-10-CM | POA: Insufficient documentation

## 2013-04-11 DIAGNOSIS — C774 Secondary and unspecified malignant neoplasm of inguinal and lower limb lymph nodes: Secondary | ICD-10-CM | POA: Insufficient documentation

## 2013-04-11 DIAGNOSIS — C772 Secondary and unspecified malignant neoplasm of intra-abdominal lymph nodes: Secondary | ICD-10-CM

## 2013-04-11 DIAGNOSIS — Z5111 Encounter for antineoplastic chemotherapy: Secondary | ICD-10-CM

## 2013-04-11 DIAGNOSIS — C801 Malignant (primary) neoplasm, unspecified: Secondary | ICD-10-CM | POA: Insufficient documentation

## 2013-04-11 LAB — CBC WITH DIFFERENTIAL/PLATELET
Basophils Absolute: 0 10*3/uL (ref 0.0–0.1)
EOS%: 0 % (ref 0.0–7.0)
Eosinophils Absolute: 0 10*3/uL (ref 0.0–0.5)
HCT: 28.9 % — ABNORMAL LOW (ref 34.8–46.6)
HGB: 9.8 g/dL — ABNORMAL LOW (ref 11.6–15.9)
LYMPH%: 11 % — ABNORMAL LOW (ref 14.0–49.7)
MCH: 29.5 pg (ref 25.1–34.0)
MCV: 87 fL (ref 79.5–101.0)
MONO#: 0.1 10*3/uL (ref 0.1–0.9)
MONO%: 2.2 % (ref 0.0–14.0)
NEUT#: 3.1 10*3/uL (ref 1.5–6.5)
NEUT%: 86.5 % — ABNORMAL HIGH (ref 38.4–76.8)
Platelets: 333 10*3/uL (ref 145–400)
WBC: 3.6 10*3/uL — ABNORMAL LOW (ref 3.9–10.3)

## 2013-04-11 MED ORDER — DIPHENHYDRAMINE HCL 50 MG/ML IJ SOLN
50.0000 mg | Freq: Once | INTRAMUSCULAR | Status: AC
  Administered 2013-04-11: 50 mg via INTRAVENOUS

## 2013-04-11 MED ORDER — ONDANSETRON 8 MG/50ML IVPB (CHCC)
8.0000 mg | Freq: Once | INTRAVENOUS | Status: AC
  Administered 2013-04-11: 8 mg via INTRAVENOUS

## 2013-04-11 MED ORDER — DIPHENHYDRAMINE HCL 50 MG/ML IJ SOLN
INTRAMUSCULAR | Status: AC
  Filled 2013-04-11: qty 1

## 2013-04-11 MED ORDER — DEXAMETHASONE SODIUM PHOSPHATE 20 MG/5ML IJ SOLN
INTRAMUSCULAR | Status: AC
  Filled 2013-04-11: qty 5

## 2013-04-11 MED ORDER — SODIUM CHLORIDE 0.9 % IV SOLN
Freq: Once | INTRAVENOUS | Status: AC
  Administered 2013-04-11: 12:00:00 via INTRAVENOUS

## 2013-04-11 MED ORDER — FAMOTIDINE IN NACL 20-0.9 MG/50ML-% IV SOLN
20.0000 mg | Freq: Once | INTRAVENOUS | Status: AC
  Administered 2013-04-11: 20 mg via INTRAVENOUS

## 2013-04-11 MED ORDER — SODIUM CHLORIDE 0.9 % IJ SOLN
10.0000 mL | INTRAMUSCULAR | Status: DC | PRN
  Administered 2013-04-11: 10 mL
  Filled 2013-04-11: qty 10

## 2013-04-11 MED ORDER — DEXAMETHASONE SODIUM PHOSPHATE 20 MG/5ML IJ SOLN
20.0000 mg | Freq: Once | INTRAMUSCULAR | Status: AC
  Administered 2013-04-11: 20 mg via INTRAVENOUS

## 2013-04-11 MED ORDER — FAMOTIDINE IN NACL 20-0.9 MG/50ML-% IV SOLN
INTRAVENOUS | Status: AC
  Filled 2013-04-11: qty 50

## 2013-04-11 MED ORDER — ONDANSETRON 8 MG/NS 50 ML IVPB
INTRAVENOUS | Status: AC
Start: 2013-04-11 — End: 2013-04-11
  Filled 2013-04-11: qty 8

## 2013-04-11 MED ORDER — HEPARIN SOD (PORK) LOCK FLUSH 100 UNIT/ML IV SOLN
500.0000 [IU] | Freq: Once | INTRAVENOUS | Status: AC | PRN
  Administered 2013-04-11: 500 [IU]
  Filled 2013-04-11: qty 5

## 2013-04-11 MED ORDER — PACLITAXEL CHEMO INJECTION 300 MG/50ML
80.0000 mg/m2 | Freq: Once | INTRAVENOUS | Status: AC
  Administered 2013-04-11: 132 mg via INTRAVENOUS
  Filled 2013-04-11: qty 22

## 2013-04-11 NOTE — Patient Instructions (Signed)
We will continue through 3 more cycles of chemotherapy. Please keep your appointments as previously scheduled.

## 2013-04-11 NOTE — Progress Notes (Signed)
Consult Note: Gyn-Onc   Kirsten Schroeder 57 y.o. female  Chief Complaint  Patient presents with  . 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 The patient's had an excellent response to neoadjuvant chemotherapy based on the fall in CA 125 as well as improvement on a recent CT scan. The patient is tolerating chemotherapy well.  Plan: We will continue neoadjuvant chemotherapy to 3 more cycles.Maryclare Labrador obtain another CT scan for assessment after 3 more cycles. We'll continue to monitor CA 125 at the initiation of each cycle. Interval History: The patient returns for followup. She has now completed 3 cycles of dose dense carboplatin and Taxol chemotherapy. A recent CT scan shows considerable improvement of her adenopathy as well as the pelvic mass. For example, the pelvic mass now measures 9.5 x 6.9 cm while previously measured 12.7 x 10.3 cm. Other diffuse adenopathy is also smaller. The patient CA 125 value has fallen to 1717 units per mL.  Overall the patient feels much better than she did before reinitiating chemotherapy. She does have some diarrhea today secondary to the oral contrast she received for her CT scan. She is not having any neuropathy. HPI:57 year old white female initially seen in consultation at the request of Dr Mancel Bale regarding a newly diagnosed pelvic mass. The patient was initially admitted for evaluation of hydronephrosis and a UTI. Further evaluation revealed a pelvic mass retroperitoneal adenopathy. From a gynecologic point of view the patient denies any past gynecologic history. She said she had a pelvic examination in June which is relatively normal. 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 Patient denies any pelvic pain. She went through menopause at about age 26 and has not had any bleeding.   Given the extent of disease and initiated  dose dense carboplatin and Taxol in a neoadjuvant fashion.   Review of Systems:10 point review of systems is negative except as noted in interval history.   Vitals: Blood pressure 119/73, pulse 116, temperature 98.6 F (37 C), temperature source Oral, resp. rate 18, height 5\' 1"  (1.549 m), weight 140 lb 11.2 oz (63.821 kg).  Physical Exam: General : The patient is a healthy woman in no acute distress.  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 This is easily reducible. The suprapubic mass seems to have resolved. There is no evidence of ascites.  Pelvic:   Deferred today. At the patient's request.   Allergies  Allergen Reactions  . Penicillins     REACTION: RASH    Past Medical History  Diagnosis Date  . Hypertension   . Colitis     [place]  . GI bleed     Past Surgical History  Procedure Laterality Date  . Ileostomy  12/27/1988    Removal of Large Intestine---Tim Earlene Plater, Careers adviser  . Cholecystectomy  1991-1992  . Cystoscopy w/ ureteral stent placement Right 01/12/2013    Procedure: CYSTOSCOPY WITH RETROGRADE PYELOGRAM/ RIGHT DOUBLE J STENT PLACEMENT;  Surgeon: Marcine Matar, MD;  Location: [facility];  Service: Urology;  Laterality: Right;    Current Outpatient Prescriptions  Medication Sig Dispense Refill  . acyclovir (ZOVIRAX) 200 MG capsule Take 1 capsule (200 mg total) by mouth 3 (three) times daily. For  fever blister  30 capsule  2  . Calcium Carbonate-Vitamin D (CALCIUM + D PO) Take 1 tablet by mouth daily.       Marland Kitchen dexamethasone (DECADRON) 4 MG tablet Take 5 tablets with food 12 hours before Taxol  15 tablet  0  . ferrous fumarate (HEMOCYTE - 106 MG FE) 325 (106 FE) MG TABS tablet Take 1 tablet (106 mg of iron total) by mouth daily.  30 each  3  . FLAXSEED, LINSEED,  PO Take 200 mg by mouth 2 (two) times daily.      Marland Kitchen HYDROcodone-acetaminophen (NORCO/VICODIN) 5-325 MG per tablet Take 1 tablet by mouth every 6 (six) hours as needed.  30 tablet  0  . lidocaine-prilocaine (EMLA) cream Apply topically as needed. Apply 1-2 hours prior to Porta-cath access.  30 g  2  . lisinopril-hydrochlorothiazide (ZESTORETIC) 10-12.5 MG per tablet Take 1 tablet by mouth daily.  90 tablet  3  . LORazepam (ATIVAN) 0.5 MG tablet Take 0.5 mg by mouth every 6 (six) hours as needed (for nausea).      . Multiple Vitamin (MULTIVITAMIN WITH MINERALS) TABS tablet Take 1 tablet by mouth daily.      . ondansetron (ZOFRAN) 8 MG tablet Take 1-2 tablets (8-16 mg total) by mouth every 12 (twelve) hours as needed for nausea.  30 tablet  2  . oxymetazoline (AFRIN) 0.05 % nasal spray Place into the nose as needed for congestion (Helps stop nosebleed if it starts).      . sodium chloride (OCEAN) 0.65 % nasal spray Place 1 spray into the nose. Use every 1-3 hours while awake to keep nose moist, which helps to prevent nosebleeds       No current facility-administered medications for this visit.   Facility-Administered Medications Ordered in Other Visits  Medication Dose Route Frequency Provider Last Rate Last Dose  . sodium chloride 0.9 % injection 10 mL  10 mL Intracatheter PRN Lennis Buzzy Han, MD   10 mL at 01/31/13 1704  . sodium chloride 0.9 % injection 10 mL  10 mL Intravenous PRN Lennis Buzzy Han, MD   10 mL at 02/19/13 0835  . sodium chloride 0.9 % injection 10 mL  10 mL Intracatheter PRN Lennis Buzzy Han, MD   10 mL at 04/04/13 1559    History   Social History  . Marital Status: Divorced    Spouse Name: N/A    Number of Children: 2  . 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 Smoking status: Never Smoker   . Smokeless tobacco: Never Used  . Alcohol Use: No  . Drug Use: No  . Sexual Activity:  No   Other Topics Concern  . Not on file   Social History Narrative   Gets reg exercise    Family History  Problem Relation Age of Onset  . Hypertension Mother   . Arthritis Mother   . Hypertension Father   . Diabetes Father   . Hypertension Brother   . Diabetes Brother       Jeannette Corpus, MD 04/11/2013, 11:22 AM

## 2013-04-11 NOTE — Patient Instructions (Signed)
[facility] Discharge Instructions for Patients Receiving Chemotherapy  Today you received the following chemotherapy agents: Taxol.  To help prevent nausea and vomiting after your treatment, we encourage you to take your nausea medication as prescribed.   If you develop nausea and vomiting that is not controlled by your nausea medication, call the clinic.   BELOW ARE SYMPTOMS THAT SHOULD BE REPORTED IMMEDIATELY:  *FEVER GREATER THAN 100.5 F  *CHILLS WITH OR WITHOUT FEVER  NAUSEA AND VOMITING THAT IS NOT CONTROLLED WITH YOUR NAUSEA MEDICATION  *UNUSUAL SHORTNESS OF BREATH  *UNUSUAL BRUISING OR BLEEDING  TENDERNESS IN MOUTH AND THROAT WITH OR WITHOUT PRESENCE OF ULCERS  *URINARY PROBLEMS  *BOWEL PROBLEMS  UNUSUAL RASH Items with * indicate a potential emergency and should be followed up as soon as possible.  Feel free to call the clinic you have any questions or concerns. The clinic phone number is [phone].    

## 2013-04-12 ENCOUNTER — Other Ambulatory Visit: Payer: Self-pay | Admitting: Oncology

## 2013-04-12 ENCOUNTER — Ambulatory Visit (HOSPITAL_BASED_OUTPATIENT_CLINIC_OR_DEPARTMENT_OTHER): Payer: BC Managed Care – PPO

## 2013-04-12 VITALS — BP 110/73 | HR 111 | Temp 97.7°F | Resp 20

## 2013-04-12 DIAGNOSIS — C801 Malignant (primary) neoplasm, unspecified: Secondary | ICD-10-CM

## 2013-04-12 DIAGNOSIS — Z5189 Encounter for other specified aftercare: Secondary | ICD-10-CM

## 2013-04-12 DIAGNOSIS — C772 Secondary and unspecified malignant neoplasm of intra-abdominal lymph nodes: Secondary | ICD-10-CM

## 2013-04-12 MED ORDER — FILGRASTIM 300 MCG/0.5ML IJ SOLN
300.0000 ug | Freq: Once | INTRAMUSCULAR | Status: AC
  Administered 2013-04-12: 300 ug via SUBCUTANEOUS
  Filled 2013-04-12: qty 0.5

## 2013-04-16 ENCOUNTER — Ambulatory Visit: Payer: BC Managed Care – PPO | Admitting: Oncology

## 2013-04-16 ENCOUNTER — Telehealth: Payer: Self-pay | Admitting: *Deleted

## 2013-04-16 ENCOUNTER — Other Ambulatory Visit: Payer: BC Managed Care – PPO | Admitting: Lab

## 2013-04-16 NOTE — Telephone Encounter (Signed)
Per staff message and POF I have scheduled appts.  JMW  

## 2013-04-18 ENCOUNTER — Encounter: Payer: Self-pay | Admitting: *Deleted

## 2013-04-18 ENCOUNTER — Other Ambulatory Visit (HOSPITAL_BASED_OUTPATIENT_CLINIC_OR_DEPARTMENT_OTHER): Payer: BC Managed Care – PPO | Admitting: Lab

## 2013-04-18 ENCOUNTER — Ambulatory Visit (HOSPITAL_COMMUNITY)
Admission: RE | Admit: 2013-04-18 | Discharge: 2013-04-18 | Disposition: A | Discharge status: Home / Self Care | Payer: BC Managed Care – PPO | Source: Ambulatory Visit | Attending: Oncology | Admitting: Oncology

## 2013-04-18 ENCOUNTER — Ambulatory Visit (HOSPITAL_BASED_OUTPATIENT_CLINIC_OR_DEPARTMENT_OTHER): Payer: BC Managed Care – PPO

## 2013-04-18 ENCOUNTER — Ambulatory Visit (HOSPITAL_BASED_OUTPATIENT_CLINIC_OR_DEPARTMENT_OTHER): Payer: BC Managed Care – PPO | Admitting: Oncology

## 2013-04-18 ENCOUNTER — Other Ambulatory Visit: Payer: Self-pay

## 2013-04-18 ENCOUNTER — Other Ambulatory Visit: Payer: BC Managed Care – PPO | Admitting: Lab

## 2013-04-18 ENCOUNTER — Encounter: Payer: Self-pay | Admitting: Oncology

## 2013-04-18 ENCOUNTER — Ambulatory Visit: Payer: BC Managed Care – PPO | Admitting: Lab

## 2013-04-18 ENCOUNTER — Telehealth: Payer: Self-pay | Admitting: *Deleted

## 2013-04-18 VITALS — BP 104/62 | HR 93 | Temp 97.5°F | Resp 18

## 2013-04-18 VITALS — BP 104/71 | HR 118 | Temp 98.3°F | Resp 18 | Ht 61.0 in | Wt 137.8 lb

## 2013-04-18 DIAGNOSIS — D649 Anemia, unspecified: Secondary | ICD-10-CM

## 2013-04-18 DIAGNOSIS — T451X5A Adverse effect of antineoplastic and immunosuppressive drugs, initial encounter: Secondary | ICD-10-CM | POA: Insufficient documentation

## 2013-04-18 DIAGNOSIS — D6481 Anemia due to antineoplastic chemotherapy: Secondary | ICD-10-CM

## 2013-04-18 DIAGNOSIS — C772 Secondary and unspecified malignant neoplasm of intra-abdominal lymph nodes: Secondary | ICD-10-CM

## 2013-04-18 DIAGNOSIS — C801 Malignant (primary) neoplasm, unspecified: Secondary | ICD-10-CM

## 2013-04-18 DIAGNOSIS — K5190 Ulcerative colitis, unspecified, without complications: Secondary | ICD-10-CM

## 2013-04-18 LAB — COMPREHENSIVE METABOLIC PANEL (CC13)
ALT: 15 U/L (ref 0–55)
AST: 15 U/L (ref 5–34)
Albumin: 3.2 g/dL — ABNORMAL LOW (ref 3.5–5.0)
Alkaline Phosphatase: 65 U/L (ref 40–150)
Calcium: 9.4 mg/dL (ref 8.4–10.4)
Chloride: 96 mEq/L — ABNORMAL LOW (ref 98–109)
Creatinine: 0.8 mg/dL (ref 0.6–1.1)
Glucose: 132 mg/dl (ref 70–140)
Potassium: 3.7 mEq/L (ref 3.5–5.1)
Total Bilirubin: 0.61 mg/dL (ref 0.20–1.20)

## 2013-04-18 LAB — CBC WITH DIFFERENTIAL/PLATELET
BASO%: 0.2 % (ref 0.0–2.0)
EOS%: 0 % (ref 0.0–7.0)
LYMPH%: 8.8 % — ABNORMAL LOW (ref 14.0–49.7)
MCH: 30.1 pg (ref 25.1–34.0)
MCHC: 33.4 g/dL (ref 31.5–36.0)
MONO%: 2.9 % (ref 0.0–14.0)
RDW: 20.6 % — ABNORMAL HIGH (ref 11.2–14.5)
lymph#: 0.3 10*3/uL — ABNORMAL LOW (ref 0.9–3.3)

## 2013-04-18 MED ORDER — HEPARIN SOD (PORK) LOCK FLUSH 100 UNIT/ML IV SOLN
500.0000 [IU] | Freq: Every day | INTRAVENOUS | Status: AC | PRN
Start: 2013-04-18 — End: 2013-04-18
  Administered 2013-04-18: 500 [IU]
  Filled 2013-04-18: qty 5

## 2013-04-18 MED ORDER — SODIUM CHLORIDE 0.9 % IJ SOLN
10.0000 mL | INTRAMUSCULAR | Status: AC | PRN
  Administered 2013-04-18: 10 mL
  Filled 2013-04-18: qty 10

## 2013-04-18 MED ORDER — SODIUM CHLORIDE 0.9 % IV SOLN
250.0000 mL | Freq: Once | INTRAVENOUS | Status: AC
  Administered 2013-04-18: 250 mL via INTRAVENOUS

## 2013-04-18 MED ORDER — DEXAMETHASONE 4 MG PO TABS: ORAL_TABLET | ORAL | Status: DC

## 2013-04-18 MED ORDER — SODIUM CHLORIDE 0.9 % IJ SOLN
10.0000 mL | INTRAMUSCULAR | Status: DC | PRN
  Administered 2013-04-18: 10 mL via INTRAVENOUS
  Filled 2013-04-18: qty 10

## 2013-04-18 MED ORDER — LIDOCAINE-PRILOCAINE 2.5-2.5 % EX CREA: TOPICAL_CREAM | CUTANEOUS | Status: DC | PRN

## 2013-04-18 MED ORDER — DIPHENHYDRAMINE HCL 25 MG PO CAPS
ORAL_CAPSULE | ORAL | Status: AC
  Filled 2013-04-18: qty 1

## 2013-04-18 MED ORDER — ACETAMINOPHEN 325 MG PO TABS
ORAL_TABLET | ORAL | Status: AC
  Filled 2013-04-18: qty 2

## 2013-04-18 MED ORDER — ACETAMINOPHEN 325 MG PO TABS
650.0000 mg | ORAL_TABLET | Freq: Once | ORAL | Status: AC
  Administered 2013-04-18: 650 mg via ORAL

## 2013-04-18 MED ORDER — DIPHENHYDRAMINE HCL 25 MG PO CAPS
25.0000 mg | ORAL_CAPSULE | Freq: Once | ORAL | Status: AC
  Administered 2013-04-18: 25 mg via ORAL

## 2013-04-18 NOTE — Telephone Encounter (Signed)
appts made and printed...td 

## 2013-04-18 NOTE — Patient Instructions (Signed)
Implanted Port Instructions  An implanted port is a central line that has a round shape and is placed under the skin. It is used for long-term IV (intravenous) access for:  · Medicine.  · Fluids.  · Liquid nutrition, such as TPN (total parenteral nutrition).  · Blood samples.  Ports can be placed:  · In the chest area just below the collarbone (this is the most common place.)  · In the arms.  · In the belly (abdomen) area.  · In the legs.  PARTS OF THE PORT  A port has 2 main parts:  · The reservoir. The reservoir is round, disc-shaped, and will be a small, raised area under your skin.  · The reservoir is the part where a needle is inserted (accessed) to either give medicines or to draw blood.  · The catheter. The catheter is a long, slender tube that extends from the reservoir. The catheter is placed into a large vein.  · Medicine that is inserted into the reservoir goes into the catheter and then into the vein.  INSERTION OF THE PORT  · The port is surgically placed in either an operating room or in a procedural area (interventional radiology).  · Medicine may be given to help you relax during the procedure.  · The skin where the port will be inserted is numbed (local anesthetic).  · 1 or 2 small cuts (incisions) will be made in the skin to insert the port.  · The port can be used after it has been inserted.  INCISION SITE CARE  · The incision site may have small adhesive strips on it. This helps keep the incision site closed. Sometimes, no adhesive strips are placed. Instead of adhesive strips, a special kind of surgical glue is used to keep the incision closed.  · If adhesive strips were placed on the incision sites, do not take them off. They will fall off on their own.  · The incision site may be sore for 1 to 2 days. Pain medicine can help.  · Do not get the incision site wet. Bathe or shower as directed by your caregiver.  · The incision site should heal in 5 to 7 days. A small scar may form after the  incision has healed.  ACCESSING THE PORT  Special steps must be taken to access the port:  · Before the port is accessed, a numbing cream can be placed on the skin. This helps numb the skin over the port site.  · A sterile technique is used to access the port.  · The port is accessed with a needle. Only "non-coring" port needles should be used to access the port. Once the port is accessed, a blood return should be checked. This helps ensure the port is in the vein and is not clogged (clotted).  · If your caregiver believes your port should remain accessed, a clear (transparent) bandage will be placed over the needle site. The bandage and needle will need to be changed every week or as directed by your caregiver.  · Keep the bandage covering the needle clean and dry. Do not get it wet. Follow your caregiver's instructions on how to take a shower or bath when the port is accessed.  · If your port does not need to stay accessed, no bandage is needed over the port.  FLUSHING THE PORT  Flushing the port keeps it from getting clogged. How often the port is flushed depends on:  · If a   constant infusion is running. If a constant infusion is running, the port may not need to be flushed.  · If intermittent medicines are given.  · If the port is not being used.  For intermittent medicines:  · The port will need to be flushed:  · After medicines have been given.  · After blood has been drawn.  · As part of routine maintenance.  · A port is normally flushed with:  · Normal saline.  · Heparin.  · Follow your caregiver's advice on how often, how much, and the type of flush to use on your port.  IMPORTANT PORT INFORMATION  · Tell your caregiver if you are allergic to heparin.  · After your port is placed, you will get a manufacturer's information card. The card has information about your port. Keep this card with you at all times.  · There are many types of ports available. Know what kind of port you have.  · In case of an  emergency, it may be helpful to wear a medical alert bracelet. This can help alert health care workers that you have a port.  · The port can stay in for as long as your caregiver believes it is necessary.  · When it is time for the port to come out, surgery will be done to remove it. The surgery will be similar to how the port was put in.  · If you are in the hospital or clinic:  · Your port will be taken care of and flushed by a nurse.  · If you are at home:  · A home health care nurse may give medicines and take care of the port.  · You or a family member can get special training and directions for giving medicine and taking care of the port at home.  SEEK IMMEDIATE MEDICAL CARE IF:   · Your port does not flush or you are unable to get a blood return.  · New drainage or pus is coming from the incision.  · A bad smell is coming from the incision site.  · You develop swelling or increased redness at the incision site.  · You develop increased swelling or pain at the port site.  · You develop swelling or pain in the surrounding skin near the port.  · You have an oral temperature above 102° F (38.9° C), not controlled by medicine.  MAKE SURE YOU:   · Understand these instructions.  · Will watch your condition.  · Will get help right away if you are not doing well or get worse.  Document Released: 05/10/2005 Document Revised: 08/02/2011 Document Reviewed: 08/01/2008  [employer]® Patient Information ©2014 [employer].

## 2013-04-18 NOTE — Patient Instructions (Signed)
Blood Transfusion Information WHAT IS A BLOOD TRANSFUSION? A transfusion is the replacement of blood or some of its parts. Blood is made up of multiple cells which provide different functions.  Red blood cells carry oxygen and are used for blood loss replacement.  White blood cells fight against infection.  Platelets control bleeding.  Plasma helps clot blood.  Other blood products are available for specialized needs, such as hemophilia or other clotting disorders. BEFORE THE TRANSFUSION  Who gives blood for transfusions?   You may be able to donate blood to be used at a later date on yourself (autologous donation).  Relatives can be asked to donate blood. This is generally not any safer than if you have received blood from a stranger. The same precautions are taken to ensure safety when a relative's blood is donated.  Healthy volunteers who are fully evaluated to make sure their blood is safe. This is blood bank blood. Transfusion therapy is the safest it has ever been in the practice of medicine. Before blood is taken from a donor, a complete history is taken to make sure that person has no history of diseases nor engages in risky social behavior (examples are intravenous drug use or sexual activity with multiple partners). The donor's travel history is screened to minimize risk of transmitting infections, such as malaria. The donated blood is tested for signs of infectious diseases, such as HIV and hepatitis. The blood is then tested to be sure it is compatible with you in order to minimize the chance of a transfusion reaction. If you or a relative donates blood, this is often done in anticipation of surgery and is not appropriate for emergency situations. It takes many days to process the donated blood. RISKS AND COMPLICATIONS Although transfusion therapy is very safe and saves many lives, the main dangers of transfusion include:   Getting an infectious disease.  Developing a  transfusion reaction. This is an allergic reaction to something in the blood you were given. Every precaution is taken to prevent this. The decision to have a blood transfusion has been considered carefully by your caregiver before blood is given. Blood is not given unless the benefits outweigh the risks. AFTER THE TRANSFUSION  Right after receiving a blood transfusion, you will usually feel much better and more energetic. This is especially true if your red blood cells have gotten low (anemic). The transfusion raises the level of the red blood cells which carry oxygen, and this usually causes an energy increase.  The nurse administering the transfusion will monitor you carefully for complications. HOME CARE INSTRUCTIONS  No special instructions are needed after a transfusion. You may find your energy is better. Speak with your caregiver about any limitations on activity for underlying diseases you may have. SEEK MEDICAL CARE IF:   Your condition is not improving after your transfusion.  You develop redness or irritation at the intravenous (IV) site. SEEK IMMEDIATE MEDICAL CARE IF:  Any of the following symptoms occur over the next 12 hours:  Shaking chills.  You have a temperature by mouth above 102 F (38.9 C), not controlled by medicine.  Chest, back, or muscle pain.  People around you feel you are not acting correctly or are confused.  Shortness of breath or difficulty breathing.  Dizziness and fainting.  You get a rash or develop hives.  You have a decrease in urine output.  Your urine turns a dark color or changes to pink, red, or brown. Any of the following   symptoms occur over the next 10 days:  You have a temperature by mouth above 102 F (38.9 C), not controlled by medicine.  Shortness of breath.  Weakness after normal activity.  The white part of the eye turns yellow (jaundice).  You have a decrease in the amount of urine or are urinating less often.  Your  urine turns a dark color or changes to pink, red, or brown. Document Released: 05/07/2000 Document Revised: 08/02/2011 Document Reviewed: 12/25/2007 Exit[employer] Patient Information 2014 [employer].  

## 2013-04-18 NOTE — Progress Notes (Signed)
OFFICE PROGRESS NOTE   04/18/2013   Physicians:ClarkePearson, Reuel Boom; Buckner Malta, Duane Lope, Erie Noe; Dahlstedt, [name]   INTERVAL HISTORY:  Patient is seen, together with daughter, in continuing attention to neoadjuvant chemotherapy in progress for papillary serous carcinoma of probable ovarian primary. CT AP 04-10-13 shows improvement in the pelvic mass and adenopathy. She saw Dr Yolande Jolly following the CT, with his exam remarkable for suprapubic mass essentially resolved and easily reducible parastomal hernia. He recommends 3 additional cycles of present chemotherapy then repeat CT, with continued monitoring of CA 125. Patient was due day 15 cycle 4 today, however hemoglobin is down to 7.8 such that she will receive PRBCs instead of chemotherapy now. Patient has been fatigued, tho not as severely as with hemoglobin of 9.2 when transfused on 03-07-13. She has had no overt bleeding. She has been eating and pushing fluids. She has no peripheral neuropathy.   ONCOLOGIC HISTORY Patient has history of ulcerative colitis for which she had total colectomy with ileostomy in 1990, and subsequently difficult gyn exams. She had been generally stable until contracting norovirus in May 2014, with prolonged illness and weight loss then. She had yearly PE and first gyn exam by Dr Pennie Rushing in June 2014. In July 2014 she developed RLE swelling and pain, with venous dopplers negative. She developed nausea, vomiting and fever, with abdominal US 01-10-13 which showed mild right hydronephrosis. She was  admitted to [facility] from 8-20 thru 01-17-13; with evaluation including CT AP x2; consultations by Drs Retta Diones, Truett Perna, Cornett and ClarkePearson;right ureteral stent placement; and US biopsy of left inguinal lymph node. Pathology [phone] carcinoma with immunohistochemical stains consistent with serous carcinoma of gyn origin. Dr Yolande Jolly recommended dose dense taxol carboplatin,  with possible interval debulking depending on response after ~ 3 cycles. Cycle 1 day 1 dose dense taxol carboplatin was given on 01-31-13; day 15 cycle 1 was held with platelets 49k. Carboplatin dose was decreased to AUC=4 with cycle 2 on 02-21-13; day 15 cycle 2 was held due to symptomatic anemia, transfusion 2 units PRBCs on 03-07-13. Day 1 cycle 3 was given 03-14-13, day 8 on 10-29 + neupogen, and day 15 held 11-5 for platelets 51k. CT AP 04-10-13 showed improvement in pelvic mass and adenopathy, with recommendation for 3 additional cycles of chemotherapy before possible debulking surgery. Day 15 cycle 4 was held on 04-18-13 due to hemoglobin 7.8, with PRBC transfusion given.   Review of systems as above, also: No fever or symptoms of infection. Bowels moving regularly via ostomy. No cough or chest pain. PAC ok. Stable swelling RLE, using TEDS. Remainder of 10 point Review of Systems negative.  Objective:  Vital signs in last 24 hours:  BP 104/71  Pulse 118  Temp(Src) 98.3 F (36.8 C) (Oral)  Resp 18  Ht 5\' 1"  (1.549 m)  Wt 137 lb 12.8 oz (62.506 kg)  BMI 26.05 kg/m2  Weight is down 3 lbs.   Alert, oriented and appropriate. Ambulatory without assistance. Pale, not dyspneic at rest, looks tired but NAD Complete alopecia  HEENT:PERRL, sclerae not icteric. Oral mucosa moist without lesions, posterior pharynx clear.  Neck supple. No JVD.  Lymphatics:no cervical,suraclavicular, axillary or inguinal adenopathy Resp: clear to auscultation bilaterally and normal percussion bilaterally Cardio: regular rate and rhythm. No gallop. GI: soft, nontender, not distended, no mass or organomegaly. Normally active bowel sounds. Ostomy. Musculoskeletal/ Extremities: 1-2+ swelling right lower leg without cords or tenderness, otherwise without pitting edema, cords, tenderness other extremities. Neuro: no peripheral neuropathy.  Otherwise nonfocal. Psych as above. Skin without rash, ecchymosis,  petechiae Portacath-without erythema or tenderness  Lab Results:  Results for orders placed in visit on 04/18/13  CBC WITH DIFFERENTIAL      Result Value Range   WBC 3.3 (*) 3.9 - 10.3 10e3/uL   NEUT# 2.9  1.5 - 6.5 10e3/uL   HGB 7.8 (*) 11.6 - 15.9 g/dL   HCT 16.1 (*) 09.6 - 04.5 %   Platelets 105 (*) 145 - 400 10e3/uL   MCV 90.0  79.5 - 101.0 fL   MCH 30.1  25.1 - 34.0 pg   MCHC 33.4  31.5 - 36.0 g/dL   RBC 4.09 (*) 8.11 - 9.14 10e6/uL   RDW 20.6 (*) 11.2 - 14.5 %   lymph# 0.3 (*) 0.9 - 3.3 10e3/uL   MONO# 0.1  0.1 - 0.9 10e3/uL   Eosinophils Absolute 0.0  0.0 - 0.5 10e3/uL   Basophils Absolute 0.0  0.0 - 0.1 10e3/uL   NEUT% 88.1 (*) 38.4 - 76.8 %   LYMPH% 8.8 (*) 14.0 - 49.7 %   MONO% 2.9  0.0 - 14.0 %   EOS% 0.0  0.0 - 7.0 %   BASO% 0.2  0.0 - 2.0 %    CMET available after visit Na 134, CL 96, alb 3.2 otherwise normal CA 125 drawn and pending at time of this transcription  Studies/Results: CT ABDOMEN AND PELVIS WITH CONTRAST 04-10-13 COMPARISON: 01/12/2013.  FINDINGS:  The lung bases are clear. No worrisome pulmonary nodules or acute pulmonary findings. The heart is normal in size. No pericardial effusion.  The liver demonstrates diffuse fatty infiltration but no focal  hepatic lesions or intrahepatic biliary dilatation. Elongated left lobe is noted. The spleen is normal in size. No focal lesions. The  gallbladder is surgically absent. No common bile duct dilatation. The pancreas is normal. The adrenal glands and kidneys are stable. There is a double-J ureteral stent on the right side and minimal hydronephrosis.  The stomach, duodenum and small bowel are unremarkable. The patient  has had a colectomy. 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  The large pelvic mass has decreased in size since the prior study. It previously measured 12.7 x 10.3 cm and now measures 9.5 x 6.9 cm. The nodal mass is of also  decreased in size since the prior study.  The right-sided pelvic nodes had prior maximum transverse diameters  of 19 and 14 mm and are now 6.5 and 10 mm.  The retroperitoneal lymphadenopathy is markedly improved. The 13.5 mm interaortocaval lymph node identified on the prior study is no longer visualized. A left retroperitoneal node previously measured 10.8 mm and now measures 5 mm. In the parastomal hernia other was a  soft tissue mesenteric mass that measured 32 x 27 mm. This now measures 16 x 12 mm.  The inguinal adenopathy has resolved.  No new lesions are identified. The aorta and branch vessels are patent and stable.  Stable sclerotic changes involving the posterior aspect of the sacrum which may be radiation change. No destructive bone lesions.  IMPRESSION:  Interval regression of the pelvic tumor and metastatic  disease/adenopathy.  No new/acute findings.   Medications: I have reviewed the patient's current medications. Will refill EMLA and premed decadron. Will skip day 15 cycle 4 taxol today and resume treatment next week with day 1 cycle 5 on 12-3 as long as ANC >=1.5 and plt >=100k. She will  not need neupogen on 11-28 with Rx held today, will have neupogen still after upcoming treatment days.  DISCUSSION: have discussed progressive anemia and she is in agreement with transfusion of 1 unit PRBCs today. She continues oral iron. She is not eligible for the AMGEN study due to low iron stores.  Assessment/Plan: 1. 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 Will continue chemotherapy as best possible for total 6 (or 7) cycles, then repeat imaging. Next treatment will be day 1 cycle 5 on 04-25-13 if counts adequate, neupogen 12-4, MD visit about day 8. 2.Ulcerative colitis: post total colectomy 1990  with ileostomy  3.Right ureteral obstruction from gyn malignancy, stent placed by Dr Retta Diones [id number], very difficult procedure. Dr Retta Diones has asked to review upcoming CT.  4.multiple blood transfusions for ulcerative colitis problems previously, and PRBCs 2 units on 03-07-13, hemoglobin again below 8 and more symptomatic so will transfuse one unit PRBCs today. Last iron studies 02-19-13 had serum iron 90, %sat 40 5. PAC in  6.flu vaccine done  7.hx HTN.  8.rash to PCN   Patient understands plan and is in agreement, with all questions answered.     [name],LENNIS P, MD   04/18/2013, 9:12 AM

## 2013-04-19 LAB — TYPE AND SCREEN
DAT, IgG: NEGATIVE
Unit division: 0

## 2013-04-19 LAB — CA 125: CA 125: 1515.4 U/mL — ABNORMAL HIGH (ref 0.0–30.2)

## 2013-04-20 ENCOUNTER — Ambulatory Visit: Payer: BC Managed Care – PPO

## 2013-04-25 ENCOUNTER — Other Ambulatory Visit (HOSPITAL_BASED_OUTPATIENT_CLINIC_OR_DEPARTMENT_OTHER): Payer: BC Managed Care – PPO | Admitting: Lab

## 2013-04-25 ENCOUNTER — Other Ambulatory Visit: Payer: BC Managed Care – PPO | Admitting: Lab

## 2013-04-25 ENCOUNTER — Ambulatory Visit (HOSPITAL_BASED_OUTPATIENT_CLINIC_OR_DEPARTMENT_OTHER): Payer: BC Managed Care – PPO

## 2013-04-25 DIAGNOSIS — C801 Malignant (primary) neoplasm, unspecified: Secondary | ICD-10-CM

## 2013-04-25 DIAGNOSIS — C772 Secondary and unspecified malignant neoplasm of intra-abdominal lymph nodes: Secondary | ICD-10-CM

## 2013-04-25 DIAGNOSIS — Z5111 Encounter for antineoplastic chemotherapy: Secondary | ICD-10-CM

## 2013-04-25 DIAGNOSIS — D649 Anemia, unspecified: Secondary | ICD-10-CM

## 2013-04-25 LAB — CBC WITH DIFFERENTIAL/PLATELET
BASO%: 0.2 % (ref 0.0–2.0)
EOS%: 0 % (ref 0.0–7.0)
HCT: 28.7 % — ABNORMAL LOW (ref 34.8–46.6)
MCH: 30.1 pg (ref 25.1–34.0)
MCHC: 33.1 g/dL (ref 31.5–36.0)
NEUT%: 92.5 % — ABNORMAL HIGH (ref 38.4–76.8)
RBC: 3.16 10*6/uL — ABNORMAL LOW (ref 3.70–5.45)
RDW: 18.9 % — ABNORMAL HIGH (ref 11.2–14.5)
WBC: 5.2 10*3/uL (ref 3.9–10.3)
lymph#: 0.3 10*3/uL — ABNORMAL LOW (ref 0.9–3.3)

## 2013-04-25 MED ORDER — ONDANSETRON 16 MG/50ML IVPB (CHCC)
16.0000 mg | Freq: Once | INTRAVENOUS | Status: AC
  Administered 2013-04-25: 16 mg via INTRAVENOUS

## 2013-04-25 MED ORDER — DEXAMETHASONE SODIUM PHOSPHATE 20 MG/5ML IJ SOLN
INTRAMUSCULAR | Status: AC
  Filled 2013-04-25: qty 5

## 2013-04-25 MED ORDER — DIPHENHYDRAMINE HCL 50 MG/ML IJ SOLN
INTRAMUSCULAR | Status: AC
  Filled 2013-04-25: qty 1

## 2013-04-25 MED ORDER — HEPARIN SOD (PORK) LOCK FLUSH 100 UNIT/ML IV SOLN
500.0000 [IU] | Freq: Once | INTRAVENOUS | Status: AC | PRN
  Administered 2013-04-25: 500 [IU]
  Filled 2013-04-25: qty 5

## 2013-04-25 MED ORDER — SODIUM CHLORIDE 0.9 % IJ SOLN
10.0000 mL | INTRAMUSCULAR | Status: DC | PRN
  Administered 2013-04-25: 10 mL
  Filled 2013-04-25: qty 10

## 2013-04-25 MED ORDER — ONDANSETRON 16 MG/50ML IVPB (CHCC)
INTRAVENOUS | Status: AC
  Filled 2013-04-25: qty 16

## 2013-04-25 MED ORDER — SODIUM CHLORIDE 0.9 % IV SOLN
Freq: Once | INTRAVENOUS | Status: AC
  Administered 2013-04-25: 09:00:00 via INTRAVENOUS

## 2013-04-25 MED ORDER — FAMOTIDINE IN NACL 20-0.9 MG/50ML-% IV SOLN
INTRAVENOUS | Status: AC
  Filled 2013-04-25: qty 50

## 2013-04-25 MED ORDER — SODIUM CHLORIDE 0.9 % IV SOLN
460.0000 mg | Freq: Once | INTRAVENOUS | Status: AC
  Administered 2013-04-25: 460 mg via INTRAVENOUS
  Filled 2013-04-25: qty 46

## 2013-04-25 MED ORDER — DEXAMETHASONE SODIUM PHOSPHATE 20 MG/5ML IJ SOLN
20.0000 mg | Freq: Once | INTRAMUSCULAR | Status: AC
  Administered 2013-04-25: 20 mg via INTRAVENOUS

## 2013-04-25 MED ORDER — FAMOTIDINE IN NACL 20-0.9 MG/50ML-% IV SOLN
20.0000 mg | Freq: Once | INTRAVENOUS | Status: AC
  Administered 2013-04-25: 20 mg via INTRAVENOUS

## 2013-04-25 MED ORDER — DIPHENHYDRAMINE HCL 50 MG/ML IJ SOLN
50.0000 mg | Freq: Once | INTRAMUSCULAR | Status: AC
  Administered 2013-04-25: 50 mg via INTRAVENOUS

## 2013-04-25 MED ORDER — PACLITAXEL CHEMO INJECTION 300 MG/50ML
80.0000 mg/m2 | Freq: Once | INTRAVENOUS | Status: AC
  Administered 2013-04-25: 132 mg via INTRAVENOUS
  Filled 2013-04-25: qty 22

## 2013-04-25 NOTE — Patient Instructions (Signed)
[facility] Discharge Instructions for Patients Receiving Chemotherapy  Today you received the following chemotherapy agents Taxol/Carbo  To help prevent nausea and vomiting after your treatment, we encourage you to take your nausea medication as directed   If you develop nausea and vomiting that is not controlled by your nausea medication, call the clinic.   BELOW ARE SYMPTOMS THAT SHOULD BE REPORTED IMMEDIATELY:  *FEVER GREATER THAN 100.5 F  *CHILLS WITH OR WITHOUT FEVER  NAUSEA AND VOMITING THAT IS NOT CONTROLLED WITH YOUR NAUSEA MEDICATION  *UNUSUAL SHORTNESS OF BREATH  *UNUSUAL BRUISING OR BLEEDING  TENDERNESS IN MOUTH AND THROAT WITH OR WITHOUT PRESENCE OF ULCERS  *URINARY PROBLEMS  *BOWEL PROBLEMS  UNUSUAL RASH Items with * indicate a potential emergency and should be followed up as soon as possible.  Feel free to call the clinic you have any questions or concerns. The clinic phone number is [phone].    

## 2013-04-26 ENCOUNTER — Ambulatory Visit (HOSPITAL_BASED_OUTPATIENT_CLINIC_OR_DEPARTMENT_OTHER): Payer: BC Managed Care – PPO

## 2013-04-26 VITALS — BP 119/62 | HR 89 | Temp 98.3°F

## 2013-04-26 DIAGNOSIS — C772 Secondary and unspecified malignant neoplasm of intra-abdominal lymph nodes: Secondary | ICD-10-CM

## 2013-04-26 DIAGNOSIS — C801 Malignant (primary) neoplasm, unspecified: Secondary | ICD-10-CM

## 2013-04-26 DIAGNOSIS — Z5189 Encounter for other specified aftercare: Secondary | ICD-10-CM

## 2013-04-26 MED ORDER — FILGRASTIM 300 MCG/0.5ML IJ SOLN
300.0000 ug | Freq: Once | INTRAMUSCULAR | Status: AC
  Administered 2013-04-26: 300 ug via SUBCUTANEOUS
  Filled 2013-04-26: qty 0.5

## 2013-04-26 NOTE — Patient Instructions (Signed)
Filgrastim, G-CSF injection What is this medicine? FILGRASTIM, G-CSF (fil GRA stim) stimulates the formation of white blood cells. This medicine is given to patients with conditions that may cause a decrease in white blood cells, like those receiving certain types of chemotherapy or bone marrow transplant. It helps the bone marrow recover its ability to produce white blood cells. Increasing the amount of white blood cells helps to decrease the risk of infection and fever. This medicine may be used for other purposes; ask your health care provider or pharmacist if you have questions. COMMON BRAND NAME(S): Neupogen What should I tell my health care provider before I take this medicine? They need to know if you have any of these conditions: -currently receiving radiation therapy -sickle cell disease -an unusual or allergic reaction to filgrastim, E. coli protein, other medicines, foods, dyes, or preservatives -pregnant or trying to get pregnant -breast-feeding How should I use this medicine? This medicine is for injection into a vein or injection under the skin. It is usually given by a health care professional in a hospital or clinic setting. If you get this medicine at home, you will be taught how to prepare and give this medicine. Always change the site for the injection under the skin. Let the solution warm to room temperature before you use it. Do not shake the solution before you withdraw a dose. Throw away any unused portion. Use exactly as directed. Take your medicine at regular intervals. Do not take your medicine more often than directed. It is important that you put your used needles and syringes in a special sharps container. Do not put them in a trash can. If you do not have a sharps container, call your pharmacist or healthcare provider to get one. Talk to your pediatrician regarding the use of this medicine in children. While this medicine may be prescribed for children for selected  conditions, precautions do apply. Overdosage: If you think you have taken too much of this medicine contact a poison control center or emergency room at once. NOTE: This medicine is only for you. Do not share this medicine with others. What if I miss a dose? Try not to miss doses. If you miss a dose take the dose as soon as you remember. If it is almost time for the next dose, do not take double doses unless told to by your doctor or health care professional. What may interact with this medicine? -lithium -medicines for cancer chemotherapy This list may not describe all possible interactions. Give your health care provider a list of all the medicines, herbs, non-prescription drugs, or dietary supplements you use. Also tell them if you smoke, drink alcohol, or use illegal drugs. Some items may interact with your medicine. What should I watch for while using this medicine? Visit your doctor or health care professional for regular checks on your progress. If you get a fever or any sign of infection while you are using this medicine, do not treat yourself. Check with your doctor or health care professional. Bone pain can usually be relieved by mild pain relievers such as acetaminophen or ibuprofen. Check with your doctor or health care professional before taking these medicines as they may hide a fever. Call your doctor or health care professional if the aches and pains are severe or do not go away. What side effects may I notice from receiving this medicine? Side effects that you should report to your doctor or health care professional as soon as possible: -allergic reactions   like skin rash, itching or hives, swelling of the face, lips, or tongue -difficulty breathing, wheezing -fever -pain, redness, or swelling at the injection site -stomach or side pain, or pain at the shoulder Side effects that usually do not require medical attention (report to your doctor or health care professional if they  continue or are bothersome): -bone pain (ribs, lower back, breast bone) -headache -skin rash This list may not describe all possible side effects. Call your doctor for medical advice about side effects. You may report side effects to FDA at 1-800-FDA-1088. Where should I keep my medicine? Keep out of the reach of children. Store in a refrigerator between 2 and 8 degrees C (36 and 46 degrees F). Do not freeze or leave in direct sunlight. If vials or syringes are left out of the refrigerator for more than 24 hours, they must be thrown away. Throw away unused vials after the expiration date on the carton. NOTE: This sheet is a summary. It may not cover all possible information. If you have questions about this medicine, talk to your doctor, pharmacist, or health care provider.  2014, [employer]vier/Gold Standard. (2007-07-26 13:33:21)  

## 2013-04-29 ENCOUNTER — Other Ambulatory Visit: Payer: Self-pay | Admitting: Oncology

## 2013-04-30 ENCOUNTER — Ambulatory Visit (HOSPITAL_BASED_OUTPATIENT_CLINIC_OR_DEPARTMENT_OTHER): Payer: BC Managed Care – PPO | Admitting: Oncology

## 2013-04-30 ENCOUNTER — Other Ambulatory Visit (HOSPITAL_BASED_OUTPATIENT_CLINIC_OR_DEPARTMENT_OTHER): Payer: BC Managed Care – PPO | Admitting: Lab

## 2013-04-30 VITALS — BP 104/69 | HR 115 | Temp 98.1°F | Resp 18 | Ht 61.0 in | Wt 140.9 lb

## 2013-04-30 DIAGNOSIS — C772 Secondary and unspecified malignant neoplasm of intra-abdominal lymph nodes: Secondary | ICD-10-CM

## 2013-04-30 DIAGNOSIS — C801 Malignant (primary) neoplasm, unspecified: Secondary | ICD-10-CM

## 2013-04-30 DIAGNOSIS — C569 Malignant neoplasm of unspecified ovary: Secondary | ICD-10-CM

## 2013-04-30 LAB — CBC WITH DIFFERENTIAL/PLATELET
BASO%: 0.7 % (ref 0.0–2.0)
EOS%: 0.4 % (ref 0.0–7.0)
HCT: 27 % — ABNORMAL LOW (ref 34.8–46.6)
LYMPH%: 24.8 % (ref 14.0–49.7)
MCH: 30.4 pg (ref 25.1–34.0)
MCHC: 33.3 g/dL (ref 31.5–36.0)
MCV: 91.2 fL (ref 79.5–101.0)
MONO#: 0.1 10*3/uL (ref 0.1–0.9)
NEUT%: 69.4 % (ref 38.4–76.8)
Platelets: 196 10*3/uL (ref 145–400)
nRBC: 0 % (ref 0–0)

## 2013-05-01 ENCOUNTER — Telehealth: Payer: Self-pay | Admitting: *Deleted

## 2013-05-01 NOTE — Progress Notes (Signed)
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 Day 15 cycle 4 was held on 04-20-13 due to Hgb down to 7.8, with 1 unit PRBCs given. Anemia symptoms were clearly better after transfusion, with day 1 cycle 5 chemotherapy given 12-3 and neupogen on 12-4. She is due day 8 cycle 5 on 12-10, with neupogen at least 12-11.  Patient tolerated treatment on 12-10 better than usual, with less fatigue afterwards. She has been eating tho tastes only pickles, salsa and peaches. Bowel movements from ileostomy are again at baseline. She had slight tingling in toes which has now resolved. She has PAC.    ONCOLOGIC HISTORY Patient has history of ulcerative colitis for which she had total colectomy with ileostomy in 1990, and subsequently difficult gyn exams. She had been generally stable until contracting norovirus in May 2014, with prolonged illness and weight loss then. She had yearly PE and first gyn exam by Dr Pennie Rushing in June 2014. In July 2014 she developed RLE swelling and pain, with venous dopplers negative. She developed nausea, vomiting and fever, with abdominal US 01-10-13 which showed mild right hydronephrosis. She was admitted to [facility] from 8-20 thru 01-17-13; with evaluation including CT AP x2; consultations by Drs Retta Diones, Truett Perna, Cornett and ClarkePearson;right ureteral stent placement; and US biopsy of left inguinal lymph node. Pathology [phone] carcinoma with immunohistochemical stains consistent with serous carcinoma of gyn origin. Dr Yolande Jolly recommended dose dense taxol carboplatin, with possible interval debulking depending on response after ~ 3 cycles. Cycle 1 day 1 dose dense taxol carboplatin was  given on 01-31-13; day 15 cycle 1 was held with platelets 49k. Carboplatin dose was decreased to AUC=4 with cycle 2 on 02-21-13; day 15 cycle 2 was held due to symptomatic anemia, transfusion 2 units PRBCs on 03-07-13. Day 1 cycle 3 was given 03-14-13, day 8 on 10-29 + neupogen, and day 15 held 11-5 for platelets 51k. CT AP 04-10-13 showed improvement in pelvic mass and adenopathy, with recommendation for 3 additional cycles of chemotherapy before possible debulking surgery. Day 15 cycle 4 was held on 04-18-13 due to hemoglobin 7.8, with PRBC transfusion given.   Review of systems as above, also: No fever or symptoms of infection. No bleeding. No increase in swelling LE. No increase in abdominal distension. Bladder ok. PAC ok. Remainder of 10 point Review of Systems negative.  Objective:  Vital signs in last 24 hours:  BP 104/69  Pulse 115  Temp(Src) 98.1 F (36.7 C) (Oral)  Resp 18  Ht 5\' 1"  (1.549 m)  Wt 140 lb 14.4 oz (63.912 kg)  BMI 26.64 kg/m2  Alert, oriented and appropriate. Ambulatory without difficulty.  Alopecia  HEENT:PERRL, sclerae not icteric. Oral mucosa moist without lesions, posterior pharynx clear.  Neck supple. No JVD.  Lymphatics:no cervical,suraclavicular, axillary or inguinal adenopathy Resp: clear to auscultation bilaterally and normal percussion bilaterally Cardio: regular rate and rhythm. No gallop. GI: soft, nontender, not distended, seems less full with no mass or organomegaly. Normally active bowel sounds. Ileostomy not remarkable. Musculoskeletal/ Extremities: Right lower leg with 1+ swelling, otherwise without pitting edema, cords, tenderness Neuro: no peripheral neuropathy. Otherwise nonfocal Skin without rash, ecchymosis, petechiaen. Portacath-without erythema or tenderness  Lab Results:  Results for orders placed in visit on 04/30/13  CBC WITH DIFFERENTIAL  Result Value Range   WBC 2.7 (*) 3.9 - 10.3 10e3/uL   NEUT# 1.9  1.5 - 6.5 10e3/uL    HGB 9.0 (*) 11.6 - 15.9 g/dL   HCT 16.1 (*) 09.6 - 04.5 %   Platelets 196  145 - 400 10e3/uL   MCV 91.2  79.5 - 101.0 fL   MCH 30.4  25.1 - 34.0 pg   MCHC 33.3  31.5 - 36.0 g/dL   RBC 4.09 (*) 8.11 - 9.14 10e6/uL   RDW 18.1 (*) 11.2 - 14.5 %   lymph# 0.7 (*) 0.9 - 3.3 10e3/uL   MONO# 0.1  0.1 - 0.9 10e3/uL   Eosinophils Absolute 0.0  0.0 - 0.5 10e3/uL   Basophils Absolute 0.0  0.0 - 0.1 10e3/uL   NEUT% 69.4  38.4 - 76.8 %   LYMPH% 24.8  14.0 - 49.7 %   MONO% 4.7  0.0 - 14.0 %   EOS% 0.4  0.0 - 7.0 %   BASO% 0.7  0.0 - 2.0 %   nRBC 0  0 - 0 %    CMET and CA125 to be drawn from [facility] in infusion on 12-10.  Studies/Results:  No results found. Patient has had diarrhea from ileostomy after oral contrast at home for last CTs; she may do better with contrast at radiology department for subsequent scans.  Medications: I have reviewed the patient's current medications and chemotherapy orders.    Assessment/Plan: 1. 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 Will continue chemotherapy as best possible for total 6 (or 7) cycles, then repeat imaging. She will have day 8 cycle 5 on 12-10 as long as ANC >=1.2 and plt >=100k and will have day15 cycle 5 on 12-17 with same parameters. Day 1 cycle 6 will be delayed one week for the Christmas holiday. I will see her shortly prior to day 1 cycle 6. 2.Ulcerative colitis: post total colectomy 1990 with ileostomy . Note extremely sensitive to oral contrast with CTs causing diarrhea, and will need to try the different oral contrast regimen in radiology with next scans. 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 Most recent CT was copied to him.  4.multiple blood transfusions for ulcerative colitis  problems previously, and PRBCs 2 units on 03-07-13, and one unit PRBCs 04-20-13, with improvement. Last iron studies 02-19-13 had serum iron 90, %sat 40  5. PAC in  6.flu vaccine done  7.hx HTN.  8.rash to PCN   Patient and daughter followed conversation well and are in agreement with plan.     Reece Packer, MD

## 2013-05-01 NOTE — Telephone Encounter (Signed)
Per staff message and POF I have scheduled appts.  JMW  

## 2013-05-02 ENCOUNTER — Other Ambulatory Visit (HOSPITAL_BASED_OUTPATIENT_CLINIC_OR_DEPARTMENT_OTHER): Payer: BC Managed Care – PPO

## 2013-05-02 ENCOUNTER — Other Ambulatory Visit: Payer: BC Managed Care – PPO

## 2013-05-02 ENCOUNTER — Encounter: Payer: Self-pay | Admitting: Oncology

## 2013-05-02 ENCOUNTER — Ambulatory Visit (HOSPITAL_BASED_OUTPATIENT_CLINIC_OR_DEPARTMENT_OTHER): Payer: BC Managed Care – PPO

## 2013-05-02 VITALS — BP 129/72 | HR 112 | Temp 97.9°F | Resp 19

## 2013-05-02 DIAGNOSIS — C772 Secondary and unspecified malignant neoplasm of intra-abdominal lymph nodes: Secondary | ICD-10-CM

## 2013-05-02 DIAGNOSIS — C801 Malignant (primary) neoplasm, unspecified: Secondary | ICD-10-CM

## 2013-05-02 DIAGNOSIS — Z5111 Encounter for antineoplastic chemotherapy: Secondary | ICD-10-CM

## 2013-05-02 LAB — CBC WITH DIFFERENTIAL/PLATELET
BASO%: 0 % (ref 0.0–2.0)
Eosinophils Absolute: 0 10*3/uL (ref 0.0–0.5)
HCT: 25.8 % — ABNORMAL LOW (ref 34.8–46.6)
LYMPH%: 10.3 % — ABNORMAL LOW (ref 14.0–49.7)
MCHC: 33.3 g/dL (ref 31.5–36.0)
MCV: 89.9 fL (ref 79.5–101.0)
MONO#: 0 10*3/uL — ABNORMAL LOW (ref 0.1–0.9)
MONO%: 1.1 % (ref 0.0–14.0)
NEUT#: 2.5 10*3/uL (ref 1.5–6.5)
Platelets: 225 10*3/uL (ref 145–400)
RBC: 2.87 10*6/uL — ABNORMAL LOW (ref 3.70–5.45)
RDW: 17.7 % — ABNORMAL HIGH (ref 11.2–14.5)
WBC: 2.8 10*3/uL — ABNORMAL LOW (ref 3.9–10.3)

## 2013-05-02 LAB — COMPREHENSIVE METABOLIC PANEL (CC13)
ALT: 16 U/L (ref 0–55)
AST: 15 U/L (ref 5–34)
BUN: 13.1 mg/dL (ref 7.0–26.0)
Calcium: 9.8 mg/dL (ref 8.4–10.4)
Chloride: 99 mEq/L (ref 98–109)
Creatinine: 0.7 mg/dL (ref 0.6–1.1)
Sodium: 136 mEq/L (ref 136–145)
Total Bilirubin: 0.56 mg/dL (ref 0.20–1.20)

## 2013-05-02 MED ORDER — ONDANSETRON 8 MG/50ML IVPB (CHCC)
8.0000 mg | Freq: Once | INTRAVENOUS | Status: AC
  Administered 2013-05-02: 8 mg via INTRAVENOUS

## 2013-05-02 MED ORDER — DIPHENHYDRAMINE HCL 50 MG/ML IJ SOLN
INTRAMUSCULAR | Status: AC
  Filled 2013-05-02: qty 1

## 2013-05-02 MED ORDER — DEXAMETHASONE SODIUM PHOSPHATE 20 MG/5ML IJ SOLN
20.0000 mg | Freq: Once | INTRAMUSCULAR | Status: AC
  Administered 2013-05-02: 20 mg via INTRAVENOUS

## 2013-05-02 MED ORDER — SODIUM CHLORIDE 0.9 % IV SOLN
80.0000 mg/m2 | Freq: Once | INTRAVENOUS | Status: AC
  Administered 2013-05-02: 132 mg via INTRAVENOUS
  Filled 2013-05-02: qty 22

## 2013-05-02 MED ORDER — DEXAMETHASONE SODIUM PHOSPHATE 20 MG/5ML IJ SOLN
INTRAMUSCULAR | Status: AC
  Filled 2013-05-02: qty 5

## 2013-05-02 MED ORDER — ONDANSETRON 8 MG/NS 50 ML IVPB
INTRAVENOUS | Status: AC
  Filled 2013-05-02: qty 8

## 2013-05-02 MED ORDER — FAMOTIDINE IN NACL 20-0.9 MG/50ML-% IV SOLN
INTRAVENOUS | Status: AC
  Filled 2013-05-02: qty 50

## 2013-05-02 MED ORDER — FAMOTIDINE IN NACL 20-0.9 MG/50ML-% IV SOLN
20.0000 mg | Freq: Once | INTRAVENOUS | Status: AC
  Administered 2013-05-02: 20 mg via INTRAVENOUS

## 2013-05-02 MED ORDER — HEPARIN SOD (PORK) LOCK FLUSH 100 UNIT/ML IV SOLN
500.0000 [IU] | Freq: Once | INTRAVENOUS | Status: AC | PRN
  Administered 2013-05-02: 500 [IU]
  Filled 2013-05-02: qty 5

## 2013-05-02 MED ORDER — DIPHENHYDRAMINE HCL 50 MG/ML IJ SOLN
50.0000 mg | Freq: Once | INTRAMUSCULAR | Status: AC
  Administered 2013-05-02: 50 mg via INTRAVENOUS

## 2013-05-02 MED ORDER — SODIUM CHLORIDE 0.9 % IV SOLN
Freq: Once | INTRAVENOUS | Status: AC
  Administered 2013-05-02: 09:00:00 via INTRAVENOUS

## 2013-05-02 MED ORDER — SODIUM CHLORIDE 0.9 % IJ SOLN
10.0000 mL | INTRAMUSCULAR | Status: DC | PRN
  Administered 2013-05-02: 10 mL
  Filled 2013-05-02: qty 10

## 2013-05-02 NOTE — Patient Instructions (Signed)
[facility] Discharge Instructions for Patients Receiving Chemotherapy  Today you received the following chemotherapy agent Taxol. To help prevent nausea and vomiting after your treatment, we encourage you to take your nausea medication.   If you develop nausea and vomiting that is not controlled by your nausea medication, call the clinic.   BELOW ARE SYMPTOMS THAT SHOULD BE REPORTED IMMEDIATELY:  *FEVER GREATER THAN 100.5 F  *CHILLS WITH OR WITHOUT FEVER  NAUSEA AND VOMITING THAT IS NOT CONTROLLED WITH YOUR NAUSEA MEDICATION  *UNUSUAL SHORTNESS OF BREATH  *UNUSUAL BRUISING OR BLEEDING  TENDERNESS IN MOUTH AND THROAT WITH OR WITHOUT PRESENCE OF ULCERS  *URINARY PROBLEMS  *BOWEL PROBLEMS  UNUSUAL RASH Items with * indicate a potential emergency and should be followed up as soon as possible.  Feel free to call the clinic you have any questions or concerns. The clinic phone number is [phone].    

## 2013-05-03 ENCOUNTER — Ambulatory Visit (HOSPITAL_BASED_OUTPATIENT_CLINIC_OR_DEPARTMENT_OTHER): Payer: BC Managed Care – PPO

## 2013-05-03 ENCOUNTER — Other Ambulatory Visit: Payer: Self-pay | Admitting: Oncology

## 2013-05-03 VITALS — BP 127/72 | HR 114 | Temp 98.4°F

## 2013-05-03 DIAGNOSIS — Z5189 Encounter for other specified aftercare: Secondary | ICD-10-CM

## 2013-05-03 DIAGNOSIS — C772 Secondary and unspecified malignant neoplasm of intra-abdominal lymph nodes: Secondary | ICD-10-CM

## 2013-05-03 DIAGNOSIS — C801 Malignant (primary) neoplasm, unspecified: Secondary | ICD-10-CM

## 2013-05-03 LAB — CA 125: CA 125: 1283.5 U/mL — ABNORMAL HIGH (ref 0.0–30.2)

## 2013-05-03 MED ORDER — FILGRASTIM 300 MCG/0.5ML IJ SOLN
300.0000 ug | Freq: Once | INTRAMUSCULAR | Status: AC
  Administered 2013-05-03: 300 ug via SUBCUTANEOUS
  Filled 2013-05-03: qty 0.5

## 2013-05-04 ENCOUNTER — Telehealth: Payer: Self-pay | Admitting: Oncology

## 2013-05-04 NOTE — Telephone Encounter (Signed)
, °

## 2013-05-09 ENCOUNTER — Ambulatory Visit (HOSPITAL_COMMUNITY): Payer: BC Managed Care – PPO

## 2013-05-09 ENCOUNTER — Encounter (HOSPITAL_COMMUNITY)
Admission: RE | Admit: 2013-05-09 | Discharge: 2013-05-09 | Disposition: A | Discharge status: Home / Self Care | Payer: BC Managed Care – PPO | Source: Ambulatory Visit | Attending: Oncology | Admitting: Oncology

## 2013-05-09 ENCOUNTER — Other Ambulatory Visit: Payer: Self-pay | Admitting: Oncology

## 2013-05-09 ENCOUNTER — Ambulatory Visit (HOSPITAL_BASED_OUTPATIENT_CLINIC_OR_DEPARTMENT_OTHER): Payer: BC Managed Care – PPO | Admitting: Oncology

## 2013-05-09 ENCOUNTER — Other Ambulatory Visit (HOSPITAL_BASED_OUTPATIENT_CLINIC_OR_DEPARTMENT_OTHER): Payer: BC Managed Care – PPO

## 2013-05-09 ENCOUNTER — Other Ambulatory Visit: Payer: Self-pay

## 2013-05-09 ENCOUNTER — Ambulatory Visit (HOSPITAL_BASED_OUTPATIENT_CLINIC_OR_DEPARTMENT_OTHER): Payer: BC Managed Care – PPO

## 2013-05-09 VITALS — BP 149/85 | HR 102 | Temp 98.0°F | Resp 18

## 2013-05-09 DIAGNOSIS — D6481 Anemia due to antineoplastic chemotherapy: Secondary | ICD-10-CM

## 2013-05-09 DIAGNOSIS — D500 Iron deficiency anemia secondary to blood loss (chronic): Secondary | ICD-10-CM

## 2013-05-09 DIAGNOSIS — Z5189 Encounter for other specified aftercare: Secondary | ICD-10-CM

## 2013-05-09 DIAGNOSIS — C801 Malignant (primary) neoplasm, unspecified: Secondary | ICD-10-CM

## 2013-05-09 DIAGNOSIS — C772 Secondary and unspecified malignant neoplasm of intra-abdominal lymph nodes: Secondary | ICD-10-CM

## 2013-05-09 DIAGNOSIS — T451X5A Adverse effect of antineoplastic and immunosuppressive drugs, initial encounter: Secondary | ICD-10-CM

## 2013-05-09 DIAGNOSIS — R5381 Other malaise: Secondary | ICD-10-CM

## 2013-05-09 DIAGNOSIS — R0609 Other forms of dyspnea: Secondary | ICD-10-CM

## 2013-05-09 LAB — CBC WITH DIFFERENTIAL/PLATELET
Basophils Absolute: 0 10*3/uL (ref 0.0–0.1)
Eosinophils Absolute: 0 10*3/uL (ref 0.0–0.5)
HGB: 7.7 g/dL — ABNORMAL LOW (ref 11.6–15.9)
LYMPH%: 19.4 % (ref 14.0–49.7)
MCV: 90.9 fL (ref 79.5–101.0)
MONO#: 0.1 10*3/uL (ref 0.1–0.9)
NEUT#: 1.2 10*3/uL — ABNORMAL LOW (ref 1.5–6.5)
Platelets: 126 10*3/uL — ABNORMAL LOW (ref 145–400)
RBC: 2.52 10*6/uL — ABNORMAL LOW (ref 3.70–5.45)
RDW: 17.6 % — ABNORMAL HIGH (ref 11.2–14.5)
WBC: 1.7 10*3/uL — ABNORMAL LOW (ref 3.9–10.3)
lymph#: 0.3 10*3/uL — ABNORMAL LOW (ref 0.9–3.3)

## 2013-05-09 MED ORDER — ACETAMINOPHEN 325 MG PO TABS
ORAL_TABLET | ORAL | Status: AC
  Filled 2013-05-09: qty 2

## 2013-05-09 MED ORDER — SODIUM CHLORIDE 0.9 % IJ SOLN
3.0000 mL | INTRAMUSCULAR | Status: DC | PRN
  Filled 2013-05-09: qty 10

## 2013-05-09 MED ORDER — FILGRASTIM 300 MCG/0.5ML IJ SOLN
300.0000 ug | Freq: Once | INTRAMUSCULAR | Status: AC
  Administered 2013-05-09: 300 ug via SUBCUTANEOUS
  Filled 2013-05-09: qty 0.5

## 2013-05-09 MED ORDER — HEPARIN SOD (PORK) LOCK FLUSH 100 UNIT/ML IV SOLN
500.0000 [IU] | Freq: Once | INTRAVENOUS | Status: AC
  Administered 2013-05-09: 500 [IU] via INTRAVENOUS
  Filled 2013-05-09: qty 5

## 2013-05-09 MED ORDER — SODIUM CHLORIDE 0.9 % IJ SOLN
10.0000 mL | Freq: Once | INTRAMUSCULAR | Status: AC
  Administered 2013-05-09: 10 mL
  Filled 2013-05-09: qty 10

## 2013-05-09 MED ORDER — SODIUM CHLORIDE 0.9 % IV SOLN
250.0000 mL | Freq: Once | INTRAVENOUS | Status: AC
  Administered 2013-05-09: 250 mL via INTRAVENOUS

## 2013-05-09 MED ORDER — HEPARIN SOD (PORK) LOCK FLUSH 100 UNIT/ML IV SOLN
250.0000 [IU] | INTRAVENOUS | Status: DC | PRN
  Filled 2013-05-09: qty 5

## 2013-05-09 MED ORDER — DEXAMETHASONE 4 MG PO TABS: ORAL_TABLET | ORAL | Status: DC

## 2013-05-09 MED ORDER — ACETAMINOPHEN 325 MG PO TABS
650.0000 mg | ORAL_TABLET | Freq: Once | ORAL | Status: AC
  Administered 2013-05-09: 650 mg via ORAL

## 2013-05-09 NOTE — Progress Notes (Signed)
Patient c/o of pain in Rt. Back flank r/t stent placement in Kidney in August.  Pt. Reports pain of 3 on the 0 to 10 pain scale. Pt states uses Vidodin at home for pain, however pt declines pain medication at this time.  Advised to notify if pain increases or if she wants pain medication. Pt. Verbalized understanding.

## 2013-05-09 NOTE — Patient Instructions (Addendum)
Blood Transfusion Information WHAT IS A BLOOD TRANSFUSION? A transfusion is the replacement of blood or some of its parts. Blood is made up of multiple cells which provide different functions.  Red blood cells carry oxygen and are used for blood loss replacement.  White blood cells fight against infection.  Platelets control bleeding.  Plasma helps clot blood.  Other blood products are available for specialized needs, such as hemophilia or other clotting disorders. BEFORE THE TRANSFUSION  Who gives blood for transfusions?   You may be able to donate blood to be used at a later date on yourself (autologous donation).  Relatives can be asked to donate blood. This is generally not any safer than if you have received blood from a stranger. The same precautions are taken to ensure safety when a relative's blood is donated.  Healthy volunteers who are fully evaluated to make sure their blood is safe. This is blood bank blood. Transfusion therapy is the safest it has ever been in the practice of medicine. Before blood is taken from a donor, a complete history is taken to make sure that person has no history of diseases nor engages in risky social behavior (examples are intravenous drug use or sexual activity with multiple partners). The donor's travel history is screened to minimize risk of transmitting infections, such as malaria. The donated blood is tested for signs of infectious diseases, such as HIV and hepatitis. The blood is then tested to be sure it is compatible with you in order to minimize the chance of a transfusion reaction. If you or a relative donates blood, this is often done in anticipation of surgery and is not appropriate for emergency situations. It takes many days to process the donated blood. RISKS AND COMPLICATIONS Although transfusion therapy is very safe and saves many lives, the main dangers of transfusion include:   Getting an infectious disease.  Developing a  transfusion reaction. This is an allergic reaction to something in the blood you were given. Every precaution is taken to prevent this. The decision to have a blood transfusion has been considered carefully by your caregiver before blood is given. Blood is not given unless the benefits outweigh the risks. AFTER THE TRANSFUSION  Right after receiving a blood transfusion, you will usually feel much better and more energetic. This is especially true if your red blood cells have gotten low (anemic). The transfusion raises the level of the red blood cells which carry oxygen, and this usually causes an energy increase.  The nurse administering the transfusion will monitor you carefully for complications. HOME CARE INSTRUCTIONS  No special instructions are needed after a transfusion. You may find your energy is better. Speak with your caregiver about any limitations on activity for underlying diseases you may have. SEEK MEDICAL CARE IF:   Your condition is not improving after your transfusion.  You develop redness or irritation at the intravenous (IV) site. SEEK IMMEDIATE MEDICAL CARE IF:  Any of the following symptoms occur over the next 12 hours:  Shaking chills.  You have a temperature by mouth above 102 F (38.9 C), not controlled by medicine.  Chest, back, or muscle pain.  People around you feel you are not acting correctly or are confused.  Shortness of breath or difficulty breathing.  Dizziness and fainting.  You get a rash or develop hives.  You have a decrease in urine output.  Your urine turns a dark color or changes to pink, red, or brown. Any of the following   symptoms occur over the next 10 days:  You have a temperature by mouth above 102 F (38.9 C), not controlled by medicine.  Shortness of breath.  Weakness after normal activity.  The white part of the eye turns yellow (jaundice).  You have a decrease in the amount of urine or are urinating less often.  Your  urine turns a dark color or changes to pink, red, or brown. Document Released: 05/07/2000 Document Revised: 08/02/2011 Document Reviewed: 12/25/2007 ExitCare Patient Information 2014 [employer].  Filgrastim, G-CSF injection (Neupogen) What is this medicine? FILGRASTIM, G-CSF (fil GRA stim) stimulates the formation of white blood cells. This medicine is given to patients with conditions that may cause a decrease in white blood cells, like those receiving certain types of chemotherapy or bone marrow transplant. It helps the bone marrow recover its ability to produce white blood cells. Increasing the amount of white blood cells helps to decrease the risk of infection and fever. This medicine may be used for other purposes; ask your health care provider or pharmacist if you have questions. COMMON BRAND NAME(S): Neupogen What should I tell my health care provider before I take this medicine? They need to know if you have any of these conditions: -currently receiving radiation therapy -sickle cell disease -an unusual or allergic reaction to filgrastim, E. coli protein, other medicines, foods, dyes, or preservatives -pregnant or trying to get pregnant -breast-feeding How should I use this medicine? This medicine is for injection into a vein or injection under the skin. It is usually given by a health care professional in a hospital or clinic setting. If you get this medicine at home, you will be taught how to prepare and give this medicine. Always change the site for the injection under the skin. Let the solution warm to room temperature before you use it. Do not shake the solution before you withdraw a dose. Throw away any unused portion. Use exactly as directed. Take your medicine at regular intervals. Do not take your medicine more often than directed. It is important that you put your used needles and syringes in a special sharps container. Do not put them in a trash can. If you do not have a  sharps container, call your pharmacist or healthcare provider to get one. Talk to your pediatrician regarding the use of this medicine in children. While this medicine may be prescribed for children for selected conditions, precautions do apply. Overdosage: If you think you have taken too much of this medicine contact a poison control center or emergency room at once. NOTE: This medicine is only for you. Do not share this medicine with others. What if I miss a dose? Try not to miss doses. If you miss a dose take the dose as soon as you remember. If it is almost time for the next dose, do not take double doses unless told to by your doctor or health care professional. What may interact with this medicine? -lithium -medicines for cancer chemotherapy This list may not describe all possible interactions. Give your health care provider a list of all the medicines, herbs, non-prescription drugs, or dietary supplements you use. Also tell them if you smoke, drink alcohol, or use illegal drugs. Some items may interact with your medicine. What should I watch for while using this medicine? Visit your doctor or health care professional for regular checks on your progress. If you get a fever or any sign of infection while you are using this medicine, do not treat yourself.   Check with your doctor or health care professional. Bone pain can usually be relieved by mild pain relievers such as acetaminophen or ibuprofen. Check with your doctor or health care professional before taking these medicines as they may hide a fever. Call your doctor or health care professional if the aches and pains are severe or do not go away. What side effects may I notice from receiving this medicine? Side effects that you should report to your doctor or health care professional as soon as possible: -allergic reactions like skin rash, itching or hives, swelling of the face, lips, or tongue -difficulty breathing, wheezing -fever -pain,  redness, or swelling at the injection site -stomach or side pain, or pain at the shoulder Side effects that usually do not require medical attention (report to your doctor or health care professional if they continue or are bothersome): -bone pain (ribs, lower back, breast bone) -headache -skin rash This list may not describe all possible side effects. Call your doctor for medical advice about side effects. You may report side effects to FDA at 1-800-FDA-1088. Where should I keep my medicine? Keep out of the reach of children. Store in a refrigerator between 2 and 8 degrees C (36 and 46 degrees F). Do not freeze or leave in direct sunlight. If vials or syringes are left out of the refrigerator for more than 24 hours, they must be thrown away. Throw away unused vials after the expiration date on the carton. NOTE: This sheet is a summary. It may not cover all possible information. If you have questions about this medicine, talk to your doctor, pharmacist, or health care provider.  2014, Elsevier/Gold Standard. (2007-07-26 13:33:21)  

## 2013-05-09 NOTE — Telephone Encounter (Signed)
Kirsten Schroeder is experiencing discomfort from her stent for the past week per Dr. Darrold Span.   Called Dr. Lenoria Chime office on moble phone in infusion and Ms. Leonor Liv spoke with [employer]. the triage nurse and discussed symptoms.

## 2013-05-10 ENCOUNTER — Encounter: Payer: Self-pay | Admitting: Oncology

## 2013-05-10 ENCOUNTER — Ambulatory Visit: Payer: BC Managed Care – PPO

## 2013-05-10 LAB — TYPE AND SCREEN
ABO/RH(D): O POS
Antibody Screen: POSITIVE
DAT, IgG: NEGATIVE
Donor AG Type: NEGATIVE
Unit division: 0

## 2013-05-10 NOTE — Progress Notes (Signed)
OFFICE PROGRESS NOTE   05/10/2013   Physicians:ClarkePearson, Reuel Boom; Buckner Malta, Duane Lope, Erie Noe; Dahlstedt, [name]   INTERVAL HISTORY:  Patient is seen in [place] area for an unscheduled visit, here for planned day 15 cycle 5 dose dense carbo taxol, but hemoglobin down to 7.7 and symptomatic, with other counts borderline for treatment including ANC 1.2 and platelets 127k. She is noticeably more fatigued and is SOB with exertion; she has not had overt bleeding. She has been much more uncomfortable from right ureteral stent for past 2 days, without fever or gross hematuria. She has not contacted urology, which this office may need to do. I have recommended that we transfuse 1 unit PRBCs today instead of planned chemotherapy. RN will follow up and contact urology if needed.   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 Plan neoadjuvant chemotherapy prior to any surgical staging.  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 She had been generally stable until contracting norovirus in May 2014, with prolonged illness and weight loss then. She had yearly PE and first gyn exam by Dr Pennie Rushing in June 2014. In July 2014 she developed RLE swelling and pain, with venous dopplers negative. She developed nausea, vomiting and fever, with abdominal US 01-10-13 which showed mild right hydronephrosis. She was admitted to [facility] from 8-20 thru 01-17-13; with evaluation including CT AP x2; consultations by Drs Retta Diones, Truett Perna, Cornett and ClarkePearson;right ureteral stent placement; and US biopsy of left inguinal lymph node. Pathology [phone] carcinoma with immunohistochemical stains consistent with serous carcinoma of gyn origin. Dr Yolande Jolly recommended dose dense taxol carboplatin, with possible  interval debulking depending on response after ~ 3 cycles. Cycle 1 day 1 dose dense taxol carboplatin was given on 01-31-13; day 15 cycle 1 was held with platelets 49k. Carboplatin dose was decreased to AUC=4 with cycle 2 on 02-21-13; day 15 cycle 2 was held due to symptomatic anemia, transfusion 2 units PRBCs on 03-07-13. Day 1 cycle 3 was given 03-14-13, day 8 on 10-29 + neupogen, and day 15 held 11-5 for platelets 51k. CT AP 04-10-13 showed improvement in pelvic mass and adenopathy, with recommendation for 3 additional cycles of chemotherapy before possible debulking surgery. Day 15 cycle 4 was held on 04-18-13 due to hemoglobin 7.8, with PRBC transfusion given.       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 Generally weak but not acutely SOB and no chest pain. Remainder of 10 point Review of Systems negative.  Objective:  Vital signs in last 24 hours: BP 133/78, HR 97 and regular, resp 18 at rest, temp 97.6  Alert, oriented and appropriate. Ambulatory slowly. Alopecia. Pale. Respirations not labored RA seated. No pedal edema.    Lab Results:  Results for orders placed in visit on 05/09/13  CBC WITH DIFFERENTIAL      Result Value Range   WBC 1.7 (*) 3.9 - 10.3 10e3/uL   NEUT# 1.2 (*) 1.5 - 6.5 10e3/uL   HGB 7.7 (*) 11.6 - 15.9 g/dL   HCT 08.6 (*) 57.8 - 46.9 %   Platelets 126 (*) 145 - 400 10e3/uL   MCV 90.9  79.5 - 101.0 fL   MCH 30.6  25.1 - 34.0  pg   MCHC 33.6  31.5 - 36.0 g/dL   RBC 4.09 (*) 8.11 - 9.14 10e6/uL   RDW 17.6 (*) 11.2 - 14.5 %   lymph# 0.3 (*) 0.9 - 3.3 10e3/uL   MONO# 0.1  0.1 - 0.9 10e3/uL   Eosinophils Absolute 0.0  0.0 - 0.5 10e3/uL   Basophils Absolute 0.0  0.0 - 0.1 10e3/uL   NEUT% 71.5  38.4 - 76.8 %   LYMPH% 19.4  14.0 - 49.7 %   MONO% 7.9  0.0 - 14.0 %   EOS% 0.6  0.0 - 7.0 %   BASO% 0.6  0.0 -  2.0 %   Type and cross to be drawn from [facility] in infusion. CA 125 12-10 1283, having been 2700 in Oct and 4131 in Sept.  CMET drawn day 8 cycle 5 on 05-02-13  Studies/Results:  No results found.  Medications: I have reviewed the patient's current medications. Orders placed now  for 1 unit PRBCs with premedication tylenol. I have spoken directly with infusion RNs about change of orders including neupogen today.    Assessment/Plan: 1.serous gyn carcinoma: neoadjuvant chemotherapy in process with some response to this point by CT after 3 cycles chemo, however counts too low for day 15 cycle 5 today and will instead transfuse 1 unit of PRBCs for symptomatic anemia. She will have neupogen today instead of 12-18 and, as planned, will not have chemo week of Christmas. I will see her at least 12-29 and expect to continue treatment with day 1 cycle 6 on 12-31. Note CA 125 improved but still quite elevated 2.right ureteral stent in place by Dr Retta Diones: patient feels this is significantly more uncomfortable in past 1-2 days. Needs to discuss with urology. 3.ulcerative colitis, post total colectomy 1990 with ileostomy.  4.extremely sensitive to oral contrast with CTs, causing excessive and prolonged diarrhea from ileostomy. 5.multiple blood transfusions for ulcerative colitis in past and total 3 units PRBCs since Oct 2014, with additional 1 unit to be given today      Reece Packer, MD   05/10/2013, 7:29 PM

## 2013-05-11 ENCOUNTER — Telehealth: Payer: Self-pay

## 2013-05-11 NOTE — Telephone Encounter (Signed)
Message copied by Lorine Bears on Fri May 11, 2013  2:09 PM ------      Message from: Jama Flavors P      Created: Thu May 10, 2013  7:30 PM       Please check on her by phone 12-19 - is she feeling better since PRBCs 12-17 and did she get in touch with urologist?            Cc LA, TH ------

## 2013-05-11 NOTE — Telephone Encounter (Signed)
Ms. Kirsten Schroeder states she feels much better.  Not sob. The Urologist called in Oxybutynin 5 mg q 8 hours which has helped tremendously with pain from stent.  She is up and about doing activities as she would like. She is able to sleep at night.  Pt. very pleased.

## 2013-05-13 ENCOUNTER — Other Ambulatory Visit: Payer: Self-pay | Admitting: Oncology

## 2013-05-21 ENCOUNTER — Ambulatory Visit (HOSPITAL_BASED_OUTPATIENT_CLINIC_OR_DEPARTMENT_OTHER): Payer: BC Managed Care – PPO | Admitting: Oncology

## 2013-05-21 ENCOUNTER — Encounter: Payer: Self-pay | Admitting: Oncology

## 2013-05-21 ENCOUNTER — Other Ambulatory Visit (HOSPITAL_BASED_OUTPATIENT_CLINIC_OR_DEPARTMENT_OTHER): Payer: BC Managed Care – PPO

## 2013-05-21 VITALS — BP 121/76 | HR 96 | Temp 98.3°F | Resp 20 | Ht 61.0 in | Wt 146.5 lb

## 2013-05-21 DIAGNOSIS — C801 Malignant (primary) neoplasm, unspecified: Secondary | ICD-10-CM

## 2013-05-21 DIAGNOSIS — C772 Secondary and unspecified malignant neoplasm of intra-abdominal lymph nodes: Secondary | ICD-10-CM

## 2013-05-21 DIAGNOSIS — C569 Malignant neoplasm of unspecified ovary: Secondary | ICD-10-CM

## 2013-05-21 LAB — BASIC METABOLIC PANEL (CC13)
BUN: 7.7 mg/dL (ref 7.0–26.0)
CO2: 27 mEq/L (ref 22–29)
Calcium: 9.5 mg/dL (ref 8.4–10.4)
Chloride: 101 mEq/L (ref 98–109)
Creatinine: 0.7 mg/dL (ref 0.6–1.1)
Sodium: 139 mEq/L (ref 136–145)

## 2013-05-21 LAB — CBC WITH DIFFERENTIAL/PLATELET
BASO%: 0.6 % (ref 0.0–2.0)
EOS%: 0.2 % (ref 0.0–7.0)
Eosinophils Absolute: 0 10*3/uL (ref 0.0–0.5)
LYMPH%: 16.3 % (ref 14.0–49.7)
MCH: 33.7 pg (ref 25.1–34.0)
MCHC: 34.8 g/dL (ref 31.5–36.0)
MCV: 96.9 fL (ref 79.5–101.0)
MONO%: 7.7 % (ref 0.0–14.0)
NEUT%: 75.2 % (ref 38.4–76.8)
Platelets: 183 10*3/uL (ref 145–400)
RBC: 3.06 10*6/uL — ABNORMAL LOW (ref 3.70–5.45)

## 2013-05-21 NOTE — Progress Notes (Signed)
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 Day 15 cycle 5 dose dense carbo taxol was held on 05-09-13 due to symptomatic anemia (hgb 7.7), PRBCs + neupogen given that day (ANC 1.2). She felt much better this week, able to mop the kitchen and not SOB with usual exertion. She started oxybutinin from urology, which has resolved the stent discomfort with just one dose daily. She is tolerating usual 6 small meals daily better than prior to starting chemo, and has had some improvement in taste this week. Bowels are moving as usual via ileostomy. Abdomen is no longer sore, as at presentation; no bleeding, no fever or symptoms of infection, no significant peripheral neuropathy.   ONCOLOGIC HISTORY Serous carcinoma consistent with gyn primary from biopsy of inguinal lymph node 01-15-13. Plan neoadjuvant chemotherapy prior to any surgical staging.  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 She had been generally stable until contracting norovirus in May 2014, with prolonged illness and weight loss then. She had yearly PE and first gyn exam by Dr Pennie Rushing in June 2014. In July 2014 she developed RLE swelling and pain, with venous dopplers negative. She developed nausea, vomiting and fever, with abdominal US 01-10-13 which showed mild right hydronephrosis. She was admitted to [facility] from 8-20 thru 01-17-13; with evaluation including CT AP x2; consultations by Drs Retta Diones, Truett Perna, Cornett and ClarkePearson;right ureteral stent  placement; and US biopsy of left inguinal lymph node. Pathology [phone] carcinoma with immunohistochemical stains consistent with serous carcinoma of gyn origin. Dr Yolande Jolly recommended dose dense taxol carboplatin, with possible interval debulking depending on response after ~ 3 cycles. Cycle 1 day 1 dose dense taxol carboplatin was given on 01-31-13; day 15 cycle 1 was held with platelets 49k. Carboplatin dose was decreased to AUC=4 with cycle 2 on 02-21-13; day 15 cycle 2 was held due to symptomatic anemia, transfusion 2 units PRBCs on 03-07-13. Day 1 cycle 3 was given 03-14-13, day 8 on 10-29 + neupogen, and day 15 held 11-5 for platelets 51k. CT AP 04-10-13 showed improvement in pelvic mass and adenopathy, with recommendation for 3 additional cycles of chemotherapy before possible debulking surgery. Day 15 cycle 4 was held on 04-18-13 due to hemoglobin 7.8, with PRBC transfusion given; day 15 cycle 5 also held and PRBCs given for hgb 7.7.  Review of systems as above, also: No cough, no N/V. PAC ok. No swelling LE. Able to sleep Remainder of 10 point Review of Systems negative.  Objective:  Vital signs in last 24 hours:  BP 121/76  Pulse 96  Temp(Src) 98.3 F (36.8 C) (Oral)  Resp 20  Ht 5\' 1"  (1.549 m)  Wt 146 lb 8 oz (66.452 kg)  BMI 27.70 kg/m2 weight is up 6 lbs  Alert, oriented and appropriate, looks much more comfortable and energetic today. Ambulatory without  difficulty.  Alopecia  HEENT:PERRL, sclerae not icteric. Oral mucosa moist without lesions, posterior pharynx clear. Mucous membranes less pale Neck supple. No JVD.  Lymphatics:no cervical,suraclavicular, axillary or inguinal adenopathy Resp: clear to auscultation bilaterally and normal percussion bilaterally Cardio: regular rate and  rhythm. No gallop. GI: soft, nontender, not distended, no appreciable mass or organomegaly. Normally active bowel sounds. Surgical incision not remarkable. Ileostomy appliance  intact. Musculoskeletal/ Extremities: without pitting edema, cords, tenderness Neuro: no peripheral neuropathy. Otherwise nonfocal Skin without rash, ecchymosis, petechiae Portacath-without erythema or tenderness  Lab Results:  Results for orders placed in visit on 05/21/13  BASIC METABOLIC PANEL (CC13)      Result Value Range   Sodium 139  136 - 145 mEq/L   Potassium 3.8  3.5 - 5.1 mEq/L   Chloride 101  98 - 109 mEq/L   CO2 27  22 - 29 mEq/L   Glucose 99  70 - 140 mg/dl   BUN 7.7  7.0 - 16.1 mg/dL   Creatinine 0.7  0.6 - 1.1 mg/dL   Calcium 9.5  8.4 - 09.6 mg/dL   Anion Gap 10  3 - 11 mEq/L  CBC WITH DIFFERENTIAL      Result Value Range   WBC 5.5  3.9 - 10.3 10e3/uL   NEUT# 4.1  1.5 - 6.5 10e3/uL   HGB 10.3 (*) 11.6 - 15.9 g/dL   HCT 04.5 (*) 40.9 - 81.1 %   Platelets 183  145 - 400 10e3/uL   MCV 96.9  79.5 - 101.0 fL   MCH 33.7  25.1 - 34.0 pg   MCHC 34.8  31.5 - 36.0 g/dL   RBC 9.14 (*) 7.82 - 9.56 10e6/uL   RDW 20.6 (*) 11.2 - 14.5 %   lymph# 0.9  0.9 - 3.3 10e3/uL   MONO# 0.4  0.1 - 0.9 10e3/uL   Eosinophils Absolute 0.0  0.0 - 0.5 10e3/uL   Basophils Absolute 0.0  0.0 - 0.1 10e3/uL   NEUT% 75.2  38.4 - 76.8 %   LYMPH% 16.3  14.0 - 49.7 %   MONO% 7.7  0.0 - 14.0 %   EOS% 0.2  0.0 - 7.0 %   BASO% 0.6  0.0 - 2.0 %   CA 125 most recently 1283 on 05-02-13, having been 1515 on 04-18-13, 2700 on 03-07-13, 4100 on 02-19-13 and 5600 on 01-16-13.  Studies/Results:  No results found.  Medications: I have reviewed the patient's current medications. The oxybutinin can be used up to tid if needed.  DISCUSSION: Plan is still to complete 6 cycles of the dose dense chemo, with day 1 cycle 6 on 05-23-13 and neupogen given day after each treatment. I will see her with day 15 cycle 6 on 06-06-13. She is to see Dr Yolande Jolly on 06-18-13. She will need another CT AP prior to visit back to Dr Yolande Jolly, to assist in decision about debulking surgery then; she may prefer to drink  contrast at radiology for CT, as she does not tolerate this well with ileostomy. I will ask NP to talk with her about this and set up CT however preferable.  Assessment/Plan: 1.serous gyn carcinoma: neoadjuvant chemotherapy in process, to begin cycle 6 on 05-23-13: clinical partial response thus far. Needs gCSF support and has required several PRBC transfusions. Repeat CT after cycle 6 and consideration of surgery then. She is very compliant and motivated to continue treatment. 2.right ureteral stent in place by Dr Retta Diones: much less discomfort now with oxybutinin once daily 3.ulcerative colitis, post total colectomy 1990 with ileostomy.  4.extremely sensitive to oral contrast with CTs, causing excessive and prolonged diarrhea from ileostomy.  5.multiple blood transfusions for ulcerative colitis in past and total 5 units PRBCs since Oct 2014. On oral ferrous fumarate;  last iron studies Sept 2014 ok, B12 normal.      Jennings Corado P, MD   05/21/2013, 11:46 AM

## 2013-05-21 NOTE — Patient Instructions (Signed)
Next appointments as scheduled. We will give you the FMLA papers when you come for chemo on 12-31.

## 2013-05-23 ENCOUNTER — Ambulatory Visit (HOSPITAL_BASED_OUTPATIENT_CLINIC_OR_DEPARTMENT_OTHER): Payer: BC Managed Care – PPO

## 2013-05-23 VITALS — BP 127/67 | HR 109 | Temp 97.8°F | Resp 20

## 2013-05-23 DIAGNOSIS — C801 Malignant (primary) neoplasm, unspecified: Secondary | ICD-10-CM

## 2013-05-23 DIAGNOSIS — C772 Secondary and unspecified malignant neoplasm of intra-abdominal lymph nodes: Secondary | ICD-10-CM

## 2013-05-23 DIAGNOSIS — Z5111 Encounter for antineoplastic chemotherapy: Secondary | ICD-10-CM

## 2013-05-23 MED ORDER — SODIUM CHLORIDE 0.9 % IV SOLN
80.0000 mg/m2 | Freq: Once | INTRAVENOUS | Status: AC
  Administered 2013-05-23: 132 mg via INTRAVENOUS
  Filled 2013-05-23: qty 22

## 2013-05-23 MED ORDER — HEPARIN SOD (PORK) LOCK FLUSH 100 UNIT/ML IV SOLN
500.0000 [IU] | Freq: Once | INTRAVENOUS | Status: AC | PRN
  Administered 2013-05-23: 500 [IU]
  Filled 2013-05-23: qty 5

## 2013-05-23 MED ORDER — FAMOTIDINE IN NACL 20-0.9 MG/50ML-% IV SOLN
20.0000 mg | Freq: Once | INTRAVENOUS | Status: AC
  Administered 2013-05-23: 20 mg via INTRAVENOUS

## 2013-05-23 MED ORDER — DIPHENHYDRAMINE HCL 50 MG/ML IJ SOLN
INTRAMUSCULAR | Status: AC
  Filled 2013-05-23: qty 1

## 2013-05-23 MED ORDER — SODIUM CHLORIDE 0.9 % IV SOLN
Freq: Once | INTRAVENOUS | Status: AC
  Administered 2013-05-23: 15:00:00 via INTRAVENOUS

## 2013-05-23 MED ORDER — DIPHENHYDRAMINE HCL 50 MG/ML IJ SOLN
50.0000 mg | Freq: Once | INTRAMUSCULAR | Status: AC
  Administered 2013-05-23: 50 mg via INTRAVENOUS

## 2013-05-23 MED ORDER — ONDANSETRON 16 MG/50ML IVPB (CHCC)
INTRAVENOUS | Status: AC
  Filled 2013-05-23: qty 16

## 2013-05-23 MED ORDER — SODIUM CHLORIDE 0.9 % IV SOLN
460.0000 mg | Freq: Once | INTRAVENOUS | Status: AC
  Administered 2013-05-23: 460 mg via INTRAVENOUS
  Filled 2013-05-23: qty 46

## 2013-05-23 MED ORDER — DEXAMETHASONE SODIUM PHOSPHATE 20 MG/5ML IJ SOLN
20.0000 mg | Freq: Once | INTRAMUSCULAR | Status: AC
  Administered 2013-05-23: 20 mg via INTRAVENOUS

## 2013-05-23 MED ORDER — DEXAMETHASONE SODIUM PHOSPHATE 20 MG/5ML IJ SOLN
INTRAMUSCULAR | Status: AC
  Filled 2013-05-23: qty 5

## 2013-05-23 MED ORDER — ONDANSETRON 16 MG/50ML IVPB (CHCC)
16.0000 mg | Freq: Once | INTRAVENOUS | Status: AC
  Administered 2013-05-23: 16 mg via INTRAVENOUS

## 2013-05-23 MED ORDER — SODIUM CHLORIDE 0.9 % IJ SOLN
10.0000 mL | INTRAMUSCULAR | Status: DC | PRN
  Administered 2013-05-23: 10 mL
  Filled 2013-05-23: qty 10

## 2013-05-23 MED ORDER — FAMOTIDINE IN NACL 20-0.9 MG/50ML-% IV SOLN
INTRAVENOUS | Status: AC
  Filled 2013-05-23: qty 50

## 2013-05-23 NOTE — Patient Instructions (Signed)
[facility] Discharge Instructions for Patients Receiving Chemotherapy  Today you received the following chemotherapy agents :  Taxol, Carboplatin.  To help prevent nausea and vomiting after your treatment, we encourage you to take your nausea medication as instructed by your physician.   If you develop nausea and vomiting that is not controlled by your nausea medication, call the clinic.   BELOW ARE SYMPTOMS THAT SHOULD BE REPORTED IMMEDIATELY:  *FEVER GREATER THAN 100.5 F  *CHILLS WITH OR WITHOUT FEVER  NAUSEA AND VOMITING THAT IS NOT CONTROLLED WITH YOUR NAUSEA MEDICATION  *UNUSUAL SHORTNESS OF BREATH  *UNUSUAL BRUISING OR BLEEDING  TENDERNESS IN MOUTH AND THROAT WITH OR WITHOUT PRESENCE OF ULCERS  *URINARY PROBLEMS  *BOWEL PROBLEMS  UNUSUAL RASH Items with * indicate a potential emergency and should be followed up as soon as possible.  Feel free to call the clinic you have any questions or concerns. The clinic phone number is [phone].    

## 2013-05-25 ENCOUNTER — Ambulatory Visit (HOSPITAL_BASED_OUTPATIENT_CLINIC_OR_DEPARTMENT_OTHER): Payer: BC Managed Care – PPO

## 2013-05-25 VITALS — BP 125/68 | HR 98 | Temp 97.3°F

## 2013-05-25 DIAGNOSIS — Z5189 Encounter for other specified aftercare: Secondary | ICD-10-CM

## 2013-05-25 DIAGNOSIS — Z95828 Presence of other vascular implants and grafts: Secondary | ICD-10-CM

## 2013-05-25 DIAGNOSIS — C801 Malignant (primary) neoplasm, unspecified: Secondary | ICD-10-CM

## 2013-05-25 DIAGNOSIS — C772 Secondary and unspecified malignant neoplasm of intra-abdominal lymph nodes: Secondary | ICD-10-CM

## 2013-05-25 MED ORDER — SODIUM CHLORIDE 0.9 % IJ SOLN
10.0000 mL | INTRAMUSCULAR | Status: DC | PRN
  Administered 2013-05-25: 10 mL via INTRAVENOUS
  Filled 2013-05-25: qty 10

## 2013-05-25 MED ORDER — FILGRASTIM 300 MCG/0.5ML IJ SOLN
300.0000 ug | Freq: Once | INTRAMUSCULAR | Status: AC
  Administered 2013-05-25: 300 ug via SUBCUTANEOUS
  Filled 2013-05-25: qty 0.5

## 2013-05-25 MED ORDER — HEPARIN SOD (PORK) LOCK FLUSH 100 UNIT/ML IV SOLN
500.0000 [IU] | Freq: Once | INTRAVENOUS | Status: AC
  Administered 2013-05-25: 500 [IU] via INTRAVENOUS
  Filled 2013-05-25: qty 5

## 2013-05-25 NOTE — Patient Instructions (Signed)
Implanted Port Instructions  An implanted port is a central line that has a round shape and is placed under the skin. It is used for long-term IV (intravenous) access for:  · Medicine.  · Fluids.  · Liquid nutrition, such as TPN (total parenteral nutrition).  · Blood samples.  Ports can be placed:  · In the chest area just below the collarbone (this is the most common place.)  · In the arms.  · In the belly (abdomen) area.  · In the legs.  PARTS OF THE PORT  A port has 2 main parts:  · The reservoir. The reservoir is round, disc-shaped, and will be a small, raised area under your skin.  · The reservoir is the part where a needle is inserted (accessed) to either give medicines or to draw blood.  · The catheter. The catheter is a long, slender tube that extends from the reservoir. The catheter is placed into a large vein.  · Medicine that is inserted into the reservoir goes into the catheter and then into the vein.  INSERTION OF THE PORT  · The port is surgically placed in either an operating room or in a procedural area (interventional radiology).  · Medicine may be given to help you relax during the procedure.  · The skin where the port will be inserted is numbed (local anesthetic).  · 1 or 2 small cuts (incisions) will be made in the skin to insert the port.  · The port can be used after it has been inserted.  INCISION SITE CARE  · The incision site may have small adhesive strips on it. This helps keep the incision site closed. Sometimes, no adhesive strips are placed. Instead of adhesive strips, a special kind of surgical glue is used to keep the incision closed.  · If adhesive strips were placed on the incision sites, do not take them off. They will fall off on their own.  · The incision site may be sore for 1 to 2 days. Pain medicine can help.  · Do not get the incision site wet. Bathe or shower as directed by your caregiver.  · The incision site should heal in 5 to 7 days. A small scar may form after the  incision has healed.  ACCESSING THE PORT  Special steps must be taken to access the port:  · Before the port is accessed, a numbing cream can be placed on the skin. This helps numb the skin over the port site.  · A sterile technique is used to access the port.  · The port is accessed with a needle. Only "non-coring" port needles should be used to access the port. Once the port is accessed, a blood return should be checked. This helps ensure the port is in the vein and is not clogged (clotted).  · If your caregiver believes your port should remain accessed, a clear (transparent) bandage will be placed over the needle site. The bandage and needle will need to be changed every week or as directed by your caregiver.  · Keep the bandage covering the needle clean and dry. Do not get it wet. Follow your caregiver's instructions on how to take a shower or bath when the port is accessed.  · If your port does not need to stay accessed, no bandage is needed over the port.  FLUSHING THE PORT  Flushing the port keeps it from getting clogged. How often the port is flushed depends on:  · If a   constant infusion is running. If a constant infusion is running, the port may not need to be flushed.  · If intermittent medicines are given.  · If the port is not being used.  For intermittent medicines:  · The port will need to be flushed:  · After medicines have been given.  · After blood has been drawn.  · As part of routine maintenance.  · A port is normally flushed with:  · Normal saline.  · Heparin.  · Follow your caregiver's advice on how often, how much, and the type of flush to use on your port.  IMPORTANT PORT INFORMATION  · Tell your caregiver if you are allergic to heparin.  · After your port is placed, you will get a manufacturer's information card. The card has information about your port. Keep this card with you at all times.  · There are many types of ports available. Know what kind of port you have.  · In case of an  emergency, it may be helpful to wear a medical alert bracelet. This can help alert health care workers that you have a port.  · The port can stay in for as long as your caregiver believes it is necessary.  · When it is time for the port to come out, surgery will be done to remove it. The surgery will be similar to how the port was put in.  · If you are in the hospital or clinic:  · Your port will be taken care of and flushed by a nurse.  · If you are at home:  · A home health care nurse may give medicines and take care of the port.  · You or a family member can get special training and directions for giving medicine and taking care of the port at home.  SEEK IMMEDIATE MEDICAL CARE IF:   · Your port does not flush or you are unable to get a blood return.  · New drainage or pus is coming from the incision.  · A bad smell is coming from the incision site.  · You develop swelling or increased redness at the incision site.  · You develop increased swelling or pain at the port site.  · You develop swelling or pain in the surrounding skin near the port.  · You have an oral temperature above 102° F (38.9° C), not controlled by medicine.  MAKE SURE YOU:   · Understand these instructions.  · Will watch your condition.  · Will get help right away if you are not doing well or get worse.  Document Released: 05/10/2005 Document Revised: 08/02/2011 Document Reviewed: 08/01/2008  [employer]® Patient Information ©2014 [employer].

## 2013-05-28 ENCOUNTER — Telehealth: Payer: Self-pay | Admitting: *Deleted

## 2013-05-28 ENCOUNTER — Other Ambulatory Visit: Payer: Self-pay | Admitting: Gynecologic Oncology

## 2013-05-28 DIAGNOSIS — C772 Secondary and unspecified malignant neoplasm of intra-abdominal lymph nodes: Secondary | ICD-10-CM

## 2013-05-28 NOTE — Telephone Encounter (Signed)
Call to pt regarding her upcoming CT scan, concerns with contrast/diarrhea.  Discussed with pt per [facility], pt can come in early to drink contrast, may need to change bag more often than not. Pt to expect a call from scheduling regarding her appt for CT. Pt verbalized understanding, no further concerns.

## 2013-05-28 NOTE — Telephone Encounter (Signed)
Message copied by GARNER, Aletha Halim on Mon May 28, 2013 12:45 PM ------      Message from: CROSS, MELISSA D      Created: Mon May 28, 2013 12:44 PM                   ----- Message -----         From: Gordy Levan, MD         Sent: 05/27/2013  10:58 AM           To: Dorothyann Gibbs, NP            Melissa -- she will finish cycle 6 neoadjuvant chemo on 1-14 and is to see Dr Josephina Shih on 1-26. She needs CT AP shortly before his visit, not set up yet. I believe she told me that she needs to drink the contrast at radiology, as she gets bad diarrhea with this and the ileostomy. Could you please ask patient/ radiology what would be best and set up CT however it should be --      Thank you!      Lennis ------

## 2013-05-30 ENCOUNTER — Other Ambulatory Visit (HOSPITAL_BASED_OUTPATIENT_CLINIC_OR_DEPARTMENT_OTHER): Payer: BC Managed Care – PPO

## 2013-05-30 ENCOUNTER — Ambulatory Visit (HOSPITAL_BASED_OUTPATIENT_CLINIC_OR_DEPARTMENT_OTHER): Payer: BC Managed Care – PPO

## 2013-05-30 VITALS — BP 134/73 | HR 90 | Temp 98.6°F

## 2013-05-30 DIAGNOSIS — C801 Malignant (primary) neoplasm, unspecified: Secondary | ICD-10-CM

## 2013-05-30 DIAGNOSIS — Z5111 Encounter for antineoplastic chemotherapy: Secondary | ICD-10-CM

## 2013-05-30 DIAGNOSIS — C772 Secondary and unspecified malignant neoplasm of intra-abdominal lymph nodes: Secondary | ICD-10-CM

## 2013-05-30 DIAGNOSIS — C569 Malignant neoplasm of unspecified ovary: Secondary | ICD-10-CM

## 2013-05-30 LAB — CBC WITH DIFFERENTIAL/PLATELET
BASO%: 0 % (ref 0.0–2.0)
BASOS ABS: 0 10*3/uL (ref 0.0–0.1)
EOS ABS: 0 10*3/uL (ref 0.0–0.5)
EOS%: 0 % (ref 0.0–7.0)
HEMATOCRIT: 28 % — AB (ref 34.8–46.6)
HEMOGLOBIN: 9.4 g/dL — AB (ref 11.6–15.9)
LYMPH%: 12.7 % — AB (ref 14.0–49.7)
MCH: 31.6 pg (ref 25.1–34.0)
MCHC: 33.6 g/dL (ref 31.5–36.0)
MCV: 94.3 fL (ref 79.5–101.0)
MONO#: 0 10*3/uL — ABNORMAL LOW (ref 0.1–0.9)
MONO%: 1 % (ref 0.0–14.0)
NEUT%: 86.3 % — ABNORMAL HIGH (ref 38.4–76.8)
NEUTROS ABS: 2.5 10*3/uL (ref 1.5–6.5)
PLATELETS: 236 10*3/uL (ref 145–400)
RBC: 2.97 10*6/uL — ABNORMAL LOW (ref 3.70–5.45)
RDW: 17.1 % — ABNORMAL HIGH (ref 11.2–14.5)
WBC: 2.9 10*3/uL — AB (ref 3.9–10.3)
lymph#: 0.4 10*3/uL — ABNORMAL LOW (ref 0.9–3.3)

## 2013-05-30 MED ORDER — SODIUM CHLORIDE 0.9 % IJ SOLN
10.0000 mL | INTRAMUSCULAR | Status: DC | PRN
  Administered 2013-05-30: 10 mL
  Filled 2013-05-30: qty 10

## 2013-05-30 MED ORDER — SODIUM CHLORIDE 0.9 % IV SOLN
Freq: Once | INTRAVENOUS | Status: AC
  Administered 2013-05-30: 10:00:00 via INTRAVENOUS

## 2013-05-30 MED ORDER — DIPHENHYDRAMINE HCL 50 MG/ML IJ SOLN
50.0000 mg | Freq: Once | INTRAMUSCULAR | Status: AC
  Administered 2013-05-30: 50 mg via INTRAVENOUS

## 2013-05-30 MED ORDER — ONDANSETRON 8 MG/NS 50 ML IVPB
INTRAVENOUS | Status: AC
  Filled 2013-05-30: qty 8

## 2013-05-30 MED ORDER — FAMOTIDINE IN NACL 20-0.9 MG/50ML-% IV SOLN
INTRAVENOUS | Status: AC
  Filled 2013-05-30: qty 50

## 2013-05-30 MED ORDER — SODIUM CHLORIDE 0.9 % IV SOLN
80.0000 mg/m2 | Freq: Once | INTRAVENOUS | Status: AC
  Administered 2013-05-30: 132 mg via INTRAVENOUS
  Filled 2013-05-30: qty 22

## 2013-05-30 MED ORDER — DIPHENHYDRAMINE HCL 50 MG/ML IJ SOLN
INTRAMUSCULAR | Status: AC
  Filled 2013-05-30: qty 1

## 2013-05-30 MED ORDER — ONDANSETRON 8 MG/50ML IVPB (CHCC)
8.0000 mg | Freq: Once | INTRAVENOUS | Status: AC
  Administered 2013-05-30: 8 mg via INTRAVENOUS

## 2013-05-30 MED ORDER — DEXAMETHASONE SODIUM PHOSPHATE 20 MG/5ML IJ SOLN
INTRAMUSCULAR | Status: AC
  Filled 2013-05-30: qty 5

## 2013-05-30 MED ORDER — FAMOTIDINE IN NACL 20-0.9 MG/50ML-% IV SOLN
20.0000 mg | Freq: Once | INTRAVENOUS | Status: AC
  Administered 2013-05-30: 20 mg via INTRAVENOUS

## 2013-05-30 MED ORDER — DEXAMETHASONE SODIUM PHOSPHATE 20 MG/5ML IJ SOLN
20.0000 mg | Freq: Once | INTRAMUSCULAR | Status: AC
  Administered 2013-05-30: 20 mg via INTRAVENOUS

## 2013-05-30 MED ORDER — HEPARIN SOD (PORK) LOCK FLUSH 100 UNIT/ML IV SOLN
500.0000 [IU] | Freq: Once | INTRAVENOUS | Status: AC | PRN
  Administered 2013-05-30: 500 [IU]
  Filled 2013-05-30: qty 5

## 2013-05-30 NOTE — Patient Instructions (Signed)
[facility] Discharge Instructions for Patients Receiving Chemotherapy  Today you received the following chemotherapy agent: Taxol   To help prevent nausea and vomiting after your treatment, we encourage you to take your nausea medication as prescribed.    If you develop nausea and vomiting that is not controlled by your nausea medication, call the clinic.   BELOW ARE SYMPTOMS THAT SHOULD BE REPORTED IMMEDIATELY:  *FEVER GREATER THAN 100.5 F  *CHILLS WITH OR WITHOUT FEVER  NAUSEA AND VOMITING THAT IS NOT CONTROLLED WITH YOUR NAUSEA MEDICATION  *UNUSUAL SHORTNESS OF BREATH  *UNUSUAL BRUISING OR BLEEDING  TENDERNESS IN MOUTH AND THROAT WITH OR WITHOUT PRESENCE OF ULCERS  *URINARY PROBLEMS  *BOWEL PROBLEMS  UNUSUAL RASH Items with * indicate a potential emergency and should be followed up as soon as possible.  Feel free to call the clinic you have any questions or concerns. The clinic phone number is [phone].    

## 2013-05-31 ENCOUNTER — Ambulatory Visit (HOSPITAL_BASED_OUTPATIENT_CLINIC_OR_DEPARTMENT_OTHER): Payer: BC Managed Care – PPO

## 2013-05-31 VITALS — BP 131/63 | HR 90 | Temp 98.3°F

## 2013-05-31 DIAGNOSIS — C772 Secondary and unspecified malignant neoplasm of intra-abdominal lymph nodes: Secondary | ICD-10-CM

## 2013-05-31 DIAGNOSIS — Z5189 Encounter for other specified aftercare: Secondary | ICD-10-CM

## 2013-05-31 DIAGNOSIS — C801 Malignant (primary) neoplasm, unspecified: Secondary | ICD-10-CM

## 2013-05-31 MED ORDER — FILGRASTIM 300 MCG/0.5ML IJ SOLN
300.0000 ug | Freq: Once | INTRAMUSCULAR | Status: AC
  Administered 2013-05-31: 300 ug via SUBCUTANEOUS
  Filled 2013-05-31: qty 0.5

## 2013-06-03 ENCOUNTER — Other Ambulatory Visit: Payer: Self-pay | Admitting: Oncology

## 2013-06-03 ENCOUNTER — Encounter: Payer: Self-pay | Admitting: Oncology

## 2013-06-03 DIAGNOSIS — C569 Malignant neoplasm of unspecified ovary: Secondary | ICD-10-CM | POA: Insufficient documentation

## 2013-06-06 ENCOUNTER — Ambulatory Visit (HOSPITAL_BASED_OUTPATIENT_CLINIC_OR_DEPARTMENT_OTHER): Payer: BC Managed Care – PPO | Admitting: Oncology

## 2013-06-06 ENCOUNTER — Ambulatory Visit: Payer: BC Managed Care – PPO

## 2013-06-06 ENCOUNTER — Other Ambulatory Visit (HOSPITAL_BASED_OUTPATIENT_CLINIC_OR_DEPARTMENT_OTHER): Payer: BC Managed Care – PPO

## 2013-06-06 ENCOUNTER — Telehealth: Payer: Self-pay | Admitting: Oncology

## 2013-06-06 ENCOUNTER — Encounter: Payer: Self-pay | Admitting: Oncology

## 2013-06-06 VITALS — BP 96/54 | HR 102 | Temp 98.1°F | Resp 18 | Ht 61.0 in | Wt 141.9 lb

## 2013-06-06 DIAGNOSIS — C801 Malignant (primary) neoplasm, unspecified: Secondary | ICD-10-CM

## 2013-06-06 DIAGNOSIS — C772 Secondary and unspecified malignant neoplasm of intra-abdominal lymph nodes: Secondary | ICD-10-CM

## 2013-06-06 DIAGNOSIS — C569 Malignant neoplasm of unspecified ovary: Secondary | ICD-10-CM

## 2013-06-06 DIAGNOSIS — D72819 Decreased white blood cell count, unspecified: Secondary | ICD-10-CM

## 2013-06-06 DIAGNOSIS — E876 Hypokalemia: Secondary | ICD-10-CM

## 2013-06-06 DIAGNOSIS — D6481 Anemia due to antineoplastic chemotherapy: Secondary | ICD-10-CM

## 2013-06-06 DIAGNOSIS — T451X5A Adverse effect of antineoplastic and immunosuppressive drugs, initial encounter: Secondary | ICD-10-CM

## 2013-06-06 DIAGNOSIS — D6959 Other secondary thrombocytopenia: Secondary | ICD-10-CM

## 2013-06-06 LAB — COMPREHENSIVE METABOLIC PANEL (CC13)
ALT: 11 U/L (ref 0–55)
AST: 13 U/L (ref 5–34)
Albumin: 3.3 g/dL — ABNORMAL LOW (ref 3.5–5.0)
Alkaline Phosphatase: 56 U/L (ref 40–150)
Anion Gap: 16 mEq/L — ABNORMAL HIGH (ref 3–11)
BUN: 11.3 mg/dL (ref 7.0–26.0)
CALCIUM: 10.1 mg/dL (ref 8.4–10.4)
CO2: 24 mEq/L (ref 22–29)
Chloride: 97 mEq/L — ABNORMAL LOW (ref 98–109)
Creatinine: 0.8 mg/dL (ref 0.6–1.1)
GLUCOSE: 177 mg/dL — AB (ref 70–140)
POTASSIUM: 3.3 meq/L — AB (ref 3.5–5.1)
Sodium: 136 mEq/L (ref 136–145)
TOTAL PROTEIN: 6.5 g/dL (ref 6.4–8.3)
Total Bilirubin: 0.71 mg/dL (ref 0.20–1.20)

## 2013-06-06 LAB — CBC WITH DIFFERENTIAL/PLATELET
BASO%: 0.1 % (ref 0.0–2.0)
BASOS ABS: 0 10*3/uL (ref 0.0–0.1)
EOS ABS: 0 10*3/uL (ref 0.0–0.5)
EOS%: 0 % (ref 0.0–7.0)
HCT: 24.8 % — ABNORMAL LOW (ref 34.8–46.6)
HEMOGLOBIN: 8.6 g/dL — AB (ref 11.6–15.9)
LYMPH#: 0.3 10*3/uL — AB (ref 0.9–3.3)
LYMPH%: 18 % (ref 14.0–49.7)
MCH: 33.3 pg (ref 25.1–34.0)
MCHC: 34.9 g/dL (ref 31.5–36.0)
MCV: 95.5 fL (ref 79.5–101.0)
MONO#: 0.1 10*3/uL (ref 0.1–0.9)
MONO%: 4.9 % (ref 0.0–14.0)
NEUT%: 77 % — ABNORMAL HIGH (ref 38.4–76.8)
NEUTROS ABS: 1.1 10*3/uL — AB (ref 1.5–6.5)
Platelets: 73 10*3/uL — ABNORMAL LOW (ref 145–400)
RBC: 2.59 10*6/uL — ABNORMAL LOW (ref 3.70–5.45)
RDW: 18.4 % — AB (ref 11.2–14.5)
WBC: 1.5 10*3/uL — ABNORMAL LOW (ref 3.9–10.3)

## 2013-06-06 LAB — CA 125: CA 125: 596.9 U/mL — ABNORMAL HIGH (ref 0.0–30.2)

## 2013-06-06 MED ORDER — FILGRASTIM 300 MCG/0.5ML IJ SOLN
300.0000 ug | Freq: Once | INTRAMUSCULAR | Status: AC
  Administered 2013-06-06: 300 ug via SUBCUTANEOUS
  Filled 2013-06-06: qty 0.5

## 2013-06-06 NOTE — Patient Instructions (Signed)
Go to radiology 2 hours before CT so you can drink the water based contrast there.  Take imodium tablets with you just in case.  Eat and drink high potassium next few days -- include gatorade, also white potatoes, bananas, OJ if possible. OK to use imodium if needed.   Call if you seem more symptomatic from anemia before next labs (will check CBC when you see Dr Josephina Shih), or if diarrhea continues.  Call for Tamiflu if flu symptoms

## 2013-06-06 NOTE — Progress Notes (Signed)
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 She has had some sinus congestion and clear drainage past few days, no fever and those symptoms better today. She has had no bleeding and is not as fatigued as when she has required PRBCs previously. She is for repeat CT on 06-14-13 and to see Dr Josephina Shih on 06-18-13. Due to problems with severe diarrhea from ileostomy x several days after last oral CT contrast, I have spoken with CT staff now and will have patient go to radiology department 2 hrs prior to CT to drink water based oral contrast there instead.  She has PAC.    ONCOLOGIC HISTORY Serous carcinoma consistent with gyn primary from biopsy of inguinal lymph node 01-15-13. Neoadjuvant chemotherapy begun 01-31-13.  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 She had been generally stable until contracting norovirus in May 2014, with prolonged illness and weight loss then. She had yearly PE and first gyn exam by Dr Leo Grosser in June 2014. In July 2014 she developed RLE swelling and pain, with venous dopplers negative. She developed nausea, vomiting and fever, with abdominal US 01-10-13 which showed mild right hydronephrosis. She was admitted to [facility] from 8-20 thru 01-17-13; with evaluation including CT AP x2; consultations by Drs Diona Fanti, Benay Spice, Cornett and ClarkePearson;right ureteral stent placement; and US biopsy of left inguinal lymph node. Pathology [phone] carcinoma with immunohistochemical stains consistent with serous carcinoma of gyn  origin. Dr Josephina Shih recommended dose dense taxol carboplatin, with possible interval debulking depending on response after ~ 3 cycles. Cycle 1 day 1 dose dense taxol carboplatin was given on 01-31-13; day 15 cycle 1 was held with platelets 49k. Carboplatin dose was decreased to AUC=4 with cycle 2 on 02-21-13; day 15 cycle 2 was held due to symptomatic anemia, transfusion 2 units PRBCs on 03-07-13. Day 1 cycle 3 was given 03-14-13, day 8 on 10-29 + neupogen, and day 15 held 11-5 for platelets 51k. CT AP 04-10-13 showed improvement in pelvic mass and adenopathy, with recommendation for 3 additional cycles of chemotherapy before possible debulking surgery. Day 15 cycle 4 was held on 04-18-13 due to hemoglobin 7.8, with PRBC transfusion given; day 15 cycle 5 also held and PRBCs given for hgb 7.7. Counts again were too low for day 15 cycle 6 on 06-06-13, with ANC 1.1 and plt 73k. CA 125 had been 5626 in 12-2012 and decreased to 597 by 06-06-13.  Review of systems as above, also: Increased output from ostomy x several days, which she believes is related to sinus drainage; stools decreased today. No abdominal pain. Is pushing fluids and eating bland diet, will add gatorade and high potassium foods. No change in swelling RLE, no pain there. Slight neuropathy symptoms in feet more noticeable when she is up during day, improve at night; none in hands. No problems with PAC. Remainder of 10 point Review of Systems negative.  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  Objective:  Vital signs in last 24 hours:  BP 96/54  Pulse 102  Temp(Src) 98.1 F (36.7 C) (Oral)  Resp 18  Ht 5\' 1"  (1.549 m)  Wt 141 lb 14.4 oz (64.365 kg)  BMI 26.83 kg/m2  SpO2 99% weight is down 4 lbs from late Dec.  Alert, oriented and appropriate. Pale but no obvious discomfort. Ambulatory without difficulty.  Alopecia  HEENT:PERRL, sclerae not icteric. Oral mucosa moist without lesions, posterior pharynx with  dull erythema, no exudate. Nasal turbinates without purulent drainage. Neck supple. No JVD.  Lymphatics:no cervical,suraclavicular, axillary or inguinal adenopathy Resp: clear to auscultation and normal percussion to bases bilaterally. Respirations not labored RA. Cardio: regular rate and rhythm. No gallop. GI: soft, nontender, not distended, no mass or organomegaly. Normally active bowel sounds. Surgical incision not remarkable. Ileostomy with associated hernia unchanged. Musculoskeletal/ Extremities: UE and LLE without pitting edema, cords, tenderness. RLE 1-2+ swelling lower leg and 1+ to mid thigh. Neuro: peripheral neuropathy as noted, otherwise nonfocal Skin without rash, ecchymosis, petechiae Portacath-without erythema or tenderness  Lab Results:  Results for orders placed in visit on 06/06/13  CBC WITH DIFFERENTIAL      Result Value Range   WBC 1.5 (*) 3.9 - 10.3 10e3/uL   NEUT# 1.1 (*) 1.5 - 6.5 10e3/uL   HGB 8.6 (*) 11.6 - 15.9 g/dL   HCT 24.8 (*) 34.8 - 46.6 %   Platelets 73 (*) 145 - 400 10e3/uL   MCV 95.5  79.5 - 101.0 fL   MCH 33.3  25.1 - 34.0 pg   MCHC 34.9  31.5 - 36.0 g/dL   RBC 2.59 (*) 3.70 - 5.45 10e6/uL   RDW 18.4 (*) 11.2 - 14.5 %   lymph# 0.3 (*) 0.9 - 3.3 10e3/uL   MONO# 0.1  0.1 - 0.9 10e3/uL   Eosinophils Absolute 0.0  0.0 - 0.5 10e3/uL   Basophils Absolute 0.0  0.0 - 0.1 10e3/uL   NEUT% 77.0 (*) 38.4 - 76.8 %   LYMPH% 18.0  14.0 - 49.7 %   MONO% 4.9  0.0 - 14.0 %   EOS% 0.0  0.0 - 7.0 %   BASO% 0.1  0.0 - 2.0 %  COMPREHENSIVE METABOLIC PANEL (MV78)      Result Value Range   Sodium 136  136 - 145 mEq/L   Potassium 3.3 (*) 3.5 - 5.1 mEq/L   Chloride 97 (*) 98 - 109 mEq/L   CO2 24  22 - 29 mEq/L   Glucose 177 (*) 70 - 140 mg/dl   BUN 11.3  7.0 - 26.0 mg/dL   Creatinine 0.8  0.6 - 1.1 mg/dL   Total Bilirubin 0.71  0.20 - 1.20 mg/dL   Alkaline Phosphatase 56  40 - 150 U/L   AST 13  5 - 34 U/L   ALT 11  0 - 55 U/L   Total Protein 6.5  6.4 - 8.3  g/dL   Albumin 3.3 (*) 3.5 - 5.0 g/dL   Calcium 10.1  8.4 - 10.4 mg/dL   Anion Gap 16 (*) 3 - 11 mEq/L    CA 125 available after visit  597, this having been 1283 in Dec, 1777 in Nov and 5626 in Aug.  Studies/Results:  No results found. Plan CT with water based oral contrast at radiology department 06-14-13  Medications: I have reviewed the patient's current medications. OK to use imodium now and if needed with CT contrast. Push gatorade etc, will need oral K+ if diarrhea recurs. Cancel day 15 cycle 6 today due to low counts. Neupogen to be given today instead of 06-07-13.  DISCUSSION: patient is aware that  she may need further chemotherapy before or after possible surgery by gyn oncology, but clearly has had clinical improvement with what chemo she has been able to take thus far.  Assessment/Plan: 1. 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 CA125 marker is improved tho still high, ? If may be related to other GI situation also. 2.Ulcerative colitis: post total colectomy 1990 with ileostomy. Recurrent diarrhea this week, seems to be improving now. Extremely sensitive to oral contrast with CTs causing diarrhea, and will need water based oral contrast regimen in radiology with next scans.  3.Right ureteral obstruction from gyn malignancy, stent placed by Dr Diona Fanti [phone], very difficult procedure. Upcoming CT needs to go to him also..  4.multiple blood transfusions with ulcerative colitis previously, and PRBCs total 4 units during this chemo, from 10-15 thru 05-09-13. Last iron studies 02-19-13 had serum iron 90, %sat 40  5. PAC in: needs flush every 6-8 weeks if not used otherwise. 6.flu vaccine done  7.hx HTN.  8.rash to PCN 9. Leukopenia, anemia, thrombocytopenia related to  chemotherapy. Plan as above. Will recheck CBC with gyn onc visit 1-26 or sooner if needed. Patient aware to call if more anemia symptoms prior to scheduled visits. 10.hypokalemia: related to diarrhea, plan as above.  I will see her at least early Feb, but will adjust this if needed depending on CT results and gyn onc recommendations. Patient and daughter understand and are in agreement with plans.     Ricard Faulkner P, MD   06/06/2013, 1:44 PM

## 2013-06-07 ENCOUNTER — Telehealth: Payer: Self-pay | Admitting: *Deleted

## 2013-06-07 ENCOUNTER — Ambulatory Visit: Payer: BC Managed Care – PPO

## 2013-06-07 NOTE — Telephone Encounter (Signed)
Pt notified of results below. States she is drinking plenty of gatorade and the diarrhea is much better.

## 2013-06-07 NOTE — Telephone Encounter (Signed)
Message copied by Patton Salles on Thu Jun 07, 2013  9:05 AM ------      Message from: Evlyn Clines P      Created: Wed Jun 06, 2013  4:21 PM       Labs seen and need follow up: please let her know ca 125 down to 596. Be sure she is pushing gatorade. If diarrhea increases again she will need oral potassium      Cc LA, TH ------

## 2013-06-14 ENCOUNTER — Ambulatory Visit (HOSPITAL_COMMUNITY)
Admission: RE | Admit: 2013-06-14 | Discharge: 2013-06-14 | Disposition: A | Discharge status: Home / Self Care | Payer: BC Managed Care – PPO | Source: Ambulatory Visit | Attending: Gynecologic Oncology | Admitting: Gynecologic Oncology

## 2013-06-14 ENCOUNTER — Encounter (HOSPITAL_COMMUNITY): Payer: Self-pay

## 2013-06-14 DIAGNOSIS — Z932 Ileostomy status: Secondary | ICD-10-CM | POA: Insufficient documentation

## 2013-06-14 DIAGNOSIS — R1909 Other intra-abdominal and pelvic swelling, mass and lump: Secondary | ICD-10-CM | POA: Insufficient documentation

## 2013-06-14 DIAGNOSIS — IMO0002 Reserved for concepts with insufficient information to code with codable children: Secondary | ICD-10-CM | POA: Insufficient documentation

## 2013-06-14 DIAGNOSIS — N1330 Unspecified hydronephrosis: Secondary | ICD-10-CM | POA: Insufficient documentation

## 2013-06-14 DIAGNOSIS — M47817 Spondylosis without myelopathy or radiculopathy, lumbosacral region: Secondary | ICD-10-CM | POA: Insufficient documentation

## 2013-06-14 DIAGNOSIS — C569 Malignant neoplasm of unspecified ovary: Secondary | ICD-10-CM | POA: Insufficient documentation

## 2013-06-14 DIAGNOSIS — M948X9 Other specified disorders of cartilage, unspecified sites: Secondary | ICD-10-CM | POA: Insufficient documentation

## 2013-06-14 DIAGNOSIS — C772 Secondary and unspecified malignant neoplasm of intra-abdominal lymph nodes: Secondary | ICD-10-CM

## 2013-06-14 MED ORDER — IOHEXOL 300 MG/ML  SOLN
100.0000 mL | Freq: Once | INTRAMUSCULAR | Status: AC | PRN
  Administered 2013-06-14: 100 mL via INTRAVENOUS

## 2013-06-14 MED ORDER — IOHEXOL 300 MG/ML  SOLN
50.0000 mL | Freq: Once | INTRAMUSCULAR | Status: AC | PRN
  Administered 2013-06-14: 50 mL via ORAL

## 2013-06-18 ENCOUNTER — Encounter: Payer: Self-pay | Admitting: Gynecology

## 2013-06-18 ENCOUNTER — Ambulatory Visit: Payer: BC Managed Care – PPO | Attending: Gynecology | Admitting: Gynecology

## 2013-06-18 ENCOUNTER — Telehealth: Payer: Self-pay | Admitting: Dietician

## 2013-06-18 VITALS — HR 90 | Temp 98.5°F | Resp 20

## 2013-06-18 DIAGNOSIS — T451X5A Adverse effect of antineoplastic and immunosuppressive drugs, initial encounter: Secondary | ICD-10-CM | POA: Insufficient documentation

## 2013-06-18 DIAGNOSIS — Y833 Surgical operation with formation of external stoma as the cause of abnormal reaction of the patient, or of later complication, without mention of misadventure at the time of the procedure: Secondary | ICD-10-CM | POA: Insufficient documentation

## 2013-06-18 DIAGNOSIS — IMO0002 Reserved for concepts with insufficient information to code with codable children: Secondary | ICD-10-CM | POA: Insufficient documentation

## 2013-06-18 DIAGNOSIS — I10 Essential (primary) hypertension: Secondary | ICD-10-CM | POA: Insufficient documentation

## 2013-06-18 DIAGNOSIS — C569 Malignant neoplasm of unspecified ovary: Secondary | ICD-10-CM

## 2013-06-18 DIAGNOSIS — R1900 Intra-abdominal and pelvic swelling, mass and lump, unspecified site: Secondary | ICD-10-CM | POA: Insufficient documentation

## 2013-06-18 DIAGNOSIS — Z79899 Other long term (current) drug therapy: Secondary | ICD-10-CM | POA: Insufficient documentation

## 2013-06-18 DIAGNOSIS — R599 Enlarged lymph nodes, unspecified: Secondary | ICD-10-CM | POA: Insufficient documentation

## 2013-06-18 DIAGNOSIS — D6481 Anemia due to antineoplastic chemotherapy: Secondary | ICD-10-CM | POA: Insufficient documentation

## 2013-06-18 NOTE — Progress Notes (Signed)
Consult Note: Gyn-Onc   Kirsten Schroeder 58 y.o. female  Chief Complaint  Patient presents with  . 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 The patient's had an excellent response to neoadjuvant chemotherapy based on the fall in CA 125 as well as improvement on a recent CT scan. The patient is tolerating chemotherapy reasonably well except for recurrent anemia.  Plan: We will continue neoadjuvant chemotherapy to 3 more cycles although I would suggest change in her scheduled to a three-week schedule of carboplatin and Taxol.Burnis Medin obtain another CT scan for assessment after 3 more cycles. We'll continue to monitor CA 125 at the initiation of each cycle.   I will contact her urologist informed him that we do not plan surgery any time soon. Interval History: The patient returns for followup. She has now completed 6 cycles of dose dense carboplatin and Taxol chemotherapy. A recent CT scan shows essentially stable sized the pelvic mass.. A recent CA 125 value was considerably lower than previously (596 as compared to prior value of 1283 units per mL.) Overall the patient feels much better than she did before reinitiating chemotherapy. Her functional status is very good.. She is not having any neuropathy. HPI:58 year old white female initially seen in consultation at the request of Dr Julieanne Manson regarding a newly diagnosed pelvic mass. The patient was initially admitted for evaluation of hydronephrosis and a UTI. Further evaluation revealed a pelvic mass retroperitoneal adenopathy. From a gynecologic point of view the patient denies any past gynecologic history. She said she had a pelvic examination in June which is relatively normal. 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 Patient denies any pelvic pain. She went through menopause at about age 12 and has not had any  bleeding.   Given the extent of disease and initiated dose dense carboplatin and Taxol in a neoadjuvant fashion.   Review of Systems:10 point review of systems is negative except as noted in interval history.   Vitals: Pulse 90, temperature 98.5 F (36.9 C), temperature source Oral, resp. rate 20.  Physical Exam: General : The patient is a healthy woman in no acute distress.  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 This is easily reducible. The suprapubic mass seems to have resolved. There is no evidence of ascites.  Pelvic:   Deferred today. At the patient's request.   Allergies  Allergen Reactions  . Penicillins     REACTION: RASH    Past Medical History  Diagnosis Date  . Hypertension   . Colitis     [place]  . GI bleed     Past Surgical History  Procedure Laterality Date  . Ileostomy  12/27/1988    Removal of Large Intestine---Tim Rosana Hoes, Psychologist, sport and exercise  . Cholecystectomy  1991-1992  . Cystoscopy w/ ureteral stent placement Right 01/12/2013    Procedure: CYSTOSCOPY WITH RETROGRADE PYELOGRAM/ RIGHT DOUBLE J STENT PLACEMENT;  Surgeon: Franchot Gallo, MD;  Location: [facility];  Service: Urology;  Laterality: Right;    Current Outpatient Prescriptions  Medication Sig Dispense Refill  . acyclovir (ZOVIRAX) 200 MG capsule Take 1 capsule (200 mg total) by mouth 3 (three) times daily. For fever blister  30 capsule  2  . Calcium Carbonate-Vitamin D (CALCIUM +  D PO) Take 1 tablet by mouth daily.       Marland Kitchen dexamethasone (DECADRON) 4 MG tablet Take 5 tablets with food 12 hours before Taxol  30 tablet  1  . Fe Fum-FA-B Cmp-C-Zn-Mg-Mn-Cu (FERROCITE PLUS) 106-1 MG TABS       . FLAXSEED, LINSEED, PO Take 200 mg by mouth 2 (two) times daily.      Marland Kitchen HYDROcodone-acetaminophen  (NORCO/VICODIN) 5-325 MG per tablet Take 1 tablet by mouth every 6 (six) hours as needed.  30 tablet  0  . lidocaine-prilocaine (EMLA) cream Apply topically as needed. Apply 1-2 hours prior to Porta-cath access.  30 g  2  . lisinopril-hydrochlorothiazide (ZESTORETIC) 10-12.5 MG per tablet Take 1 tablet by mouth daily.  90 tablet  3  . LORazepam (ATIVAN) 0.5 MG tablet Take 0.5 mg by mouth every 6 (six) hours as needed (for nausea).      . Multiple Vitamin (MULTIVITAMIN WITH MINERALS) TABS tablet Take 1 tablet by mouth daily.      . ondansetron (ZOFRAN) 8 MG tablet Take 1-2 tablets (8-16 mg total) by mouth every 12 (twelve) hours as needed for nausea.  30 tablet  2  . oxybutynin (DITROPAN) 5 MG tablet Take 5 mg by mouth 3 (three) times daily.      Marland Kitchen oxymetazoline (AFRIN) 0.05 % nasal spray Place into the nose as needed for congestion (Helps stop nosebleed if it starts).      . sodium chloride (OCEAN) 0.65 % nasal spray Place 1 spray into the nose. Use every 1-3 hours while awake to keep nose moist, which helps to prevent nosebleeds       No current facility-administered medications for this visit.   Facility-Administered Medications Ordered in Other Visits  Medication Dose Route Frequency Provider Last Rate Last Dose  . sodium chloride 0.9 % injection 10 mL  10 mL Intracatheter PRN Lennis Marion Downer, MD   10 mL at 01/31/13 1704  . sodium chloride 0.9 % injection 10 mL  10 mL Intravenous PRN Lennis Marion Downer, MD   10 mL at 02/19/13 0835  . sodium chloride 0.9 % injection 10 mL  10 mL Intracatheter PRN Lennis Marion Downer, MD   10 mL at 04/04/13 1559    History   Social History  . Marital Status: Divorced    Spouse Name: N/A    Number of Children: 2  . Years of Education: N/A   Occupational History  . [employer] [place] History Main Topics  . Smoking status: Never Smoker   . Smokeless tobacco: Never Used  . Alcohol Use: No  . Drug Use: No  . Sexual  Activity: No   Other Topics Concern  . Not on file   Social History Narrative   Gets reg exercise    Family History  Problem Relation Age of Onset  . Hypertension Mother   . Arthritis Mother   . Hypertension Father   . Diabetes Father   . Hypertension Brother   . Diabetes Brother       Alvino Chapel, MD 06/18/2013, 9:04 AM

## 2013-06-18 NOTE — Patient Instructions (Signed)
Plan to complete three more cycles of chemotherapy.  Please call for any questions or concerns.

## 2013-06-18 NOTE — Telephone Encounter (Signed)
Brief Outpatient Oncology Nutrition Note  Patient has been identified to be at risk on malnutrition screen.  Wt Readings from Last 10 Encounters:  06/06/13 141 lb 14.4 oz (64.365 kg)  05/21/13 146 lb 8 oz (66.452 kg)  04/30/13 140 lb 14.4 oz (63.912 kg)  04/18/13 137 lb 12.8 oz (62.506 kg)  04/11/13 140 lb 11.2 oz (63.821 kg)  04/02/13 141 lb 3.2 oz (64.048 kg)  03/28/13 142 lb 11.2 oz (64.728 kg)  03/21/13 142 lb 6.4 oz (64.592 kg)  03/12/13 142 lb 4.8 oz (64.547 kg)  03/07/13 139 lb 12.8 oz (63.413 kg)   Dx:  Ovarian cancer  Called patient due to variable weight.  Patient reports that she tries to eat well despite having limited taste.  Does try to have a protein with each meal.  We discussed other options to increase protein.  Potassium also discussed with food options high in potassium secondary to low levels.  Patient able to verbalize healthy changes that she has been making.  Encouraged patient to call the [facility] for questions or for an appointment.  Antonieta Iba, RD, LDN

## 2013-06-23 ENCOUNTER — Other Ambulatory Visit: Payer: Self-pay | Admitting: Oncology

## 2013-06-25 ENCOUNTER — Telehealth: Payer: Self-pay | Admitting: *Deleted

## 2013-06-25 ENCOUNTER — Ambulatory Visit (HOSPITAL_BASED_OUTPATIENT_CLINIC_OR_DEPARTMENT_OTHER): Payer: BC Managed Care – PPO | Admitting: Oncology

## 2013-06-25 ENCOUNTER — Encounter: Payer: Self-pay | Admitting: Oncology

## 2013-06-25 ENCOUNTER — Other Ambulatory Visit (HOSPITAL_BASED_OUTPATIENT_CLINIC_OR_DEPARTMENT_OTHER): Payer: BC Managed Care – PPO

## 2013-06-25 VITALS — BP 128/81 | HR 89 | Temp 98.3°F | Resp 18 | Ht 61.0 in | Wt 146.8 lb

## 2013-06-25 DIAGNOSIS — C801 Malignant (primary) neoplasm, unspecified: Secondary | ICD-10-CM

## 2013-06-25 DIAGNOSIS — E876 Hypokalemia: Secondary | ICD-10-CM

## 2013-06-25 DIAGNOSIS — D699 Hemorrhagic condition, unspecified: Secondary | ICD-10-CM

## 2013-06-25 DIAGNOSIS — C569 Malignant neoplasm of unspecified ovary: Secondary | ICD-10-CM

## 2013-06-25 DIAGNOSIS — C772 Secondary and unspecified malignant neoplasm of intra-abdominal lymph nodes: Secondary | ICD-10-CM

## 2013-06-25 DIAGNOSIS — D6481 Anemia due to antineoplastic chemotherapy: Secondary | ICD-10-CM

## 2013-06-25 DIAGNOSIS — T451X5A Adverse effect of antineoplastic and immunosuppressive drugs, initial encounter: Secondary | ICD-10-CM

## 2013-06-25 DIAGNOSIS — R197 Diarrhea, unspecified: Secondary | ICD-10-CM

## 2013-06-25 LAB — CBC WITH DIFFERENTIAL/PLATELET
BASO%: 0.9 % (ref 0.0–2.0)
BASOS ABS: 0 10*3/uL (ref 0.0–0.1)
EOS%: 1.7 % (ref 0.0–7.0)
Eosinophils Absolute: 0.1 10*3/uL (ref 0.0–0.5)
HEMATOCRIT: 32.1 % — AB (ref 34.8–46.6)
HEMOGLOBIN: 10.8 g/dL — AB (ref 11.6–15.9)
LYMPH%: 26 % (ref 14.0–49.7)
MCH: 34.9 pg — AB (ref 25.1–34.0)
MCHC: 33.6 g/dL (ref 31.5–36.0)
MCV: 103.6 fL — AB (ref 79.5–101.0)
MONO#: 0.5 10*3/uL (ref 0.1–0.9)
MONO%: 12.7 % (ref 0.0–14.0)
NEUT#: 2.2 10*3/uL (ref 1.5–6.5)
NEUT%: 58.7 % (ref 38.4–76.8)
PLATELETS: 304 10*3/uL (ref 145–400)
RBC: 3.1 10*6/uL — ABNORMAL LOW (ref 3.70–5.45)
RDW: 21.4 % — AB (ref 11.2–14.5)
WBC: 3.8 10*3/uL — ABNORMAL LOW (ref 3.9–10.3)
lymph#: 1 10*3/uL (ref 0.9–3.3)

## 2013-06-25 MED ORDER — DEXAMETHASONE 4 MG PO TABS: ORAL_TABLET | ORAL | Status: DC

## 2013-06-25 NOTE — Progress Notes (Signed)
OFFICE PROGRESS NOTE   06/25/2013   Physicians:ClarkePearson, Quillian Quince; Evalee Mutton, Marlene Bast, Lorriane Shire; Dahlstedt, Annie Main   INTERVAL HISTORY:  Patient is seen, alone for visit, in continuing attention to papillary serous carcinoma of gyn primary, for which she has received neoadjuvant dose dense taxol carboplatin from 01-31-2013 thru 05-30-2013. Chemo has been complicated by cytopenias, requiring gCSF and PRBC transfusions, however the cancer has improved. She had CT AP 06-14-13, clearly improved from Aug 2014 and at least essentially stable from Nov 2-14. CA 125 has decreased from 5626 in Aug 2014 to 1283 in Dec 2014 and 597 in Jan 2015. She saw Dr Josephina Shih on 06-18-13; he and I discussed situation by phone during that visit, and have decided to continue chemotherapy another 3 cycles then repeat scans, as it is not clear that surgery would be of more benefit at this point (note extensive abdominal surgery previously for ulcerative colitis. She also had renal US by Dr Diona Fanti 06-14-13, which patient understands had good function on right. Dr Diona Fanti is aware that no gyn oncology surgery is planned in near future. He will change the stent in April, evaluating with another [place] prior.  She has PAC in, flushed 05-30-13. She prefers Wed treatments as daughter is off work on Wednesdays.  Patient understands situation as summarized above and is comfortable with plan to do additional 3 cycles of chemotherapy now. We have discussed changing regimen to q 3 week taxol carbo, in hopes that this might be better tolerated now. We have discussed full decadron premedication for taxol, possibility of increased taxol aches and neuropathy, possible cytopenias also with this regimen (will need gCSF).   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 Toes have more numbness, not bothersome when she is active but noticeable when she goes to bed at night; she is able to sleep  without difficulty in spite of this. Energy and appetite are better in this short break off of chemo, including protein with each meal and gatorade.   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 Plan neoadjuvant chemotherapy prior to any surgical staging.  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 She had been generally stable until contracting norovirus in May 2014, with prolonged illness and weight loss then. She had yearly PE and first gyn exam by Dr Leo Grosser in June 2014. In July 2014 she developed RLE swelling and pain, with venous dopplers negative. She developed nausea, vomiting and fever, with abdominal US 01-10-13 which showed mild right hydronephrosis. She was admitted to [facility] from 8-20 thru 01-17-13; with evaluation including CT AP x2; consultations by Drs Diona Fanti, Benay Spice, Cornett and ClarkePearson;right ureteral stent placement; and US biopsy of left inguinal lymph node. Pathology [phone] carcinoma with immunohistochemical stains consistent with serous carcinoma of gyn origin. Dr Josephina Shih recommended dose dense taxol carboplatin, with possible interval debulking depending on response after ~ 3 cycles. Cycle 1 day 1 dose dense taxol carboplatin was given on 01-31-13; day 15 cycle 1 was held with platelets 49k. Carboplatin dose was decreased to AUC=4 with cycle 2 on 02-21-13; day 15 cycle 2 was held due to symptomatic anemia, transfusion 2 units PRBCs on 03-07-13. Day 1 cycle 3 was given 03-14-13, day 8 on 10-29 + neupogen, and day 15 held 11-5 for platelets 51k. CT AP 04-10-13 showed improvement in pelvic mass and  adenopathy, with recommendation for 3 additional cycles of chemotherapy before possible debulking surgery. Day 15 cycle 4 was held on 04-18-13 due to hemoglobin 7.8, with PRBC transfusion  given.       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 No bleeding. Bowels moving daily. No SOB or cough. Hair not growing back yet. Stable mild swelling RLE. No new or different pain. Remainder of 10 point Review of Systems negative.  Objective:  Vital signs in last 24 hours:  BP 128/81  Pulse 89  Temp(Src) 98.3 F (36.8 C) (Oral)  Resp 18  Ht 5\' 1"  (1.549 m)  Wt 146 lb 12.8 oz (66.588 kg)  BMI 27.75 kg/m2 Weight is up 5 lbs. Alert, oriented and appropriate. Ambulatory without difficulty.  Alopecia. Looks generally comfortable, very pleasant as always.  HEENT:PERRL, sclerae not icteric. Oral mucosa moist without lesions, posterior pharynx clear.  Neck supple. No JVD.  Lymphatics:no cervical,suraclavicular adenopathy Resp: clear to auscultation bilaterally and normal percussion bilaterally Cardio: regular rate and rhythm. No gallop. GI: soft, nontender, not distended, no mass or organomegaly. Normally active bowel sounds. Ileostomy on right, parastomal hernia Musculoskeletal/ Extremities: 1-2+ swelling right lower leg and lateral right thigh without cords, tenderness, less swelling left lower leg Neuro: no peripheral neuropathy. Otherwise nonfocal Skin without rash, ecchymosis, petechiae Portacath-without erythema or tenderness  Lab Results:  Results for orders placed in visit on 06/25/13  CBC WITH DIFFERENTIAL      Result Value Range   WBC 3.8 (*) 3.9 - 10.3 10e3/uL   NEUT# 2.2  1.5 - 6.5 10e3/uL   HGB 10.8 (*) 11.6 - 15.9 g/dL   HCT 32.1 (*) 34.8 - 46.6 %   Platelets 304  145 - 400 10e3/uL   MCV 103.6 (*) 79.5 - 101.0 fL   MCH 34.9 (*) 25.1 - 34.0 pg   MCHC 33.6  31.5 - 36.0 g/dL   RBC 3.10 (*) 3.70 - 5.45 10e6/uL   RDW 21.4 (*) 11.2 - 14.5 %   lymph# 1.0  0.9 - 3.3 10e3/uL   MONO# 0.5  0.1 - 0.9 10e3/uL    Eosinophils Absolute 0.1  0.0 - 0.5 10e3/uL   Basophils Absolute 0.0  0.0 - 0.1 10e3/uL   NEUT% 58.7  38.4 - 76.8 %   LYMPH% 26.0  14.0 - 49.7 %   MONO% 12.7  0.0 - 14.0 %   EOS% 1.7  0.0 - 7.0 %   BASO% 0.9  0.0 - 2.0 %    Last chemistries 06-06-13 (last chemo 05-30-13)  Studies/Results: CT ABDOMEN AND PELVIS WITH CONTRAST 06-14-2013   COMPARISON: 04/10/2013  FINDINGS:  The lung bases are clear. There is no pleural or pericardial  effusion identified.  No focal liver abnormality identified. Prior cholecystectomy. No  biliary dilatation. Normal appearance of the spleen.  The adrenal glands are both normal. The left kidney appears normal.  There is right-sided hydronephrosis. Again noted is right-sided  hydronephrosis with nephroureteral stent in place. When compared  with the previous exam there is increased dilatation of the right  renal collecting system with slight asymmetric decreased enhancement  of the right renal parenchyma. The urinary bladder appears normal  for degree of distention.  The abdominal aorta has a normal caliber. There is no  retroperitoneal adenopathy or small bowel mesenteric adenopathy  identified. Right external iliac lymph node  measures 8 mm, image  66/series 2. This is unchanged from previous exam. More distal right  external iliac node measures 7 mm, image 66/series 2. This is also  stable from previous exam. No enlarging pelvic lymph nodes  identified.  Mixed attenuation stress set solid and cystic mass within the  central portion of the pelvis measures 9.8 x 7.2 cm, image 64/series  2. Previously 9.5 x 6.9 cm.  On today's study there is no ascites. No peritoneal nodule or  omental tumor identified. The patient has a right lower quadrant  ileostomy with large peristomal hernia. The hernia contains  nonobstructed loops of small bowel.  Review of the visualized osseous structures shows mild spondylosis  within the lumbar spine. Sclerosus within both  sides of the  posterior sacrum are unchanged from previous exam. Abnormal areas of  sclerosis involving the bilateral inferior pubic rami are stable  from previous exam.  IMPRESSION:  1. Pelvic tumor is slightly increased in size from previous exam.  2. Increase in right-sided hydronephrosis.  3. Stable pelvic lymph nodes. No new or progressive adenopathy  identified.  4. Areas of abnormal sclerosis involving the sacrum and bilateral  inferior pubic rami are unchanged from previous exam   Medications: I have reviewed the patient's current medications. She will use decadron 20 mg 12 hrs and 6 hrs prior to taxol Chemo orders done, using AUC =4 for [place] as this was more tolerable for counts than initial AUC=5, and taxol to start at 135 mg/m2 due to preexisting neuropathy in feet. Carbo test dose added to careplan. Anticipate gCSF with my visit 2-11 in case taxol aches significant with this dosing.   Assessment/Plan:  1. 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 CA125 marker is improved. Will continue another 3 cycles of chemo, trying q 3 week dosing of taxol carboplatin, then repeat scans and follow up with Dr Josephina Shih. First treatment will be 2-4, then expect neulasta or neupogen when I see her back 2-11. As this will be #7 carboplatin, she will need skin test prior to each treatment. 2.Ulcerative colitis: post total colectomy 1990 with ileostomy. Recurrent diarrhea this week, seems to be improving now. Extremely sensitive to oral contrast with CTs causing diarrhea, needs water based oral contrast regimen in radiology.  3.Right ureteral obstruction from gyn malignancy, stent placed by Dr Diona Fanti [id number], very difficult procedure. Plan as above per patient now. 4.multiple  blood transfusions with ulcerative colitis previously, and PRBCs total 4 units during this chemo, from 10-15 thru 05-09-13. Last iron studies 02-19-13 had serum iron 90, %sat 40  5. PAC in 6.flu vaccine done  7.hx HTN.  8.rash to PCN  9. Leukopenia, anemia, thrombocytopenia related to chemotherapy. Improved in bread since treatment 05-30-13. Follow closely, gCSF.  10.hypokalemia: related to diarrhea, on supplements       [name],LENNIS P, MD   06/25/2013, 9:51 AM

## 2013-06-25 NOTE — Telephone Encounter (Signed)
appts made and printed...td 

## 2013-06-25 NOTE — Telephone Encounter (Signed)
Per staff phone call and POF I have schedueld appts.  JMW  

## 2013-06-25 NOTE — Patient Instructions (Signed)
Decadron (dexamethasone) 4 mg:  Five tablets (=20mg ) 12 hrs and 6 hrs prior to taxol, each dose with food.

## 2013-06-26 ENCOUNTER — Telehealth: Payer: Self-pay | Admitting: *Deleted

## 2013-06-26 ENCOUNTER — Other Ambulatory Visit: Payer: Self-pay | Admitting: Oncology

## 2013-06-26 ENCOUNTER — Telehealth: Payer: Self-pay | Admitting: Oncology

## 2013-06-26 NOTE — Telephone Encounter (Signed)
Message copied by Patton Salles on Tue Jun 26, 2013 12:30 PM ------      Message from: Gordy Levan      Created: Tue Jun 26, 2013 10:12 AM       For chemo 2-4, which will be q 3 week dosing of taxol carbo. I did not tell her about [place] skin test prior to each treatment, as this will be #7 [place]. RN please let her know that this will happen.      Also need to tell her that we will give either neupogen or neulasta when I see her a week after chemo. WIll not start this on day 2 as she may have more taxol aches with these doses.      I have sent another POF now for the skin test with chemo 2-4.      thanks      Cc TH, LA ------

## 2013-06-26 NOTE — Telephone Encounter (Signed)
, °

## 2013-06-26 NOTE — Telephone Encounter (Signed)
Per staff message and POF I have scheduled appts.  JMW  

## 2013-06-26 NOTE — Telephone Encounter (Signed)
Called patient to discuss [place] test dose and neupogen/neulasta as described below. Pt verbalized understanding

## 2013-06-26 NOTE — Telephone Encounter (Signed)
Message copied by Patton Salles on Tue Jun 26, 2013 11:05 AM ------      Message from: Gordy Levan      Created: Tue Jun 26, 2013 10:12 AM       For chemo 2-4, which will be q 3 week dosing of taxol carbo. I did not tell her about [place] skin test prior to each treatment, as this will be #7 [place]. RN please let her know that this will happen.      Also need to tell her that we will give either neupogen or neulasta when I see her a week after chemo. WIll not start this on day 2 as she may have more taxol aches with these doses.      I have sent another POF now for the skin test with chemo 2-4.      thanks      Cc TH, LA ------

## 2013-06-27 ENCOUNTER — Ambulatory Visit (HOSPITAL_BASED_OUTPATIENT_CLINIC_OR_DEPARTMENT_OTHER): Payer: BC Managed Care – PPO

## 2013-06-27 VITALS — BP 115/66 | HR 96 | Temp 97.6°F

## 2013-06-27 DIAGNOSIS — C569 Malignant neoplasm of unspecified ovary: Secondary | ICD-10-CM

## 2013-06-27 DIAGNOSIS — C801 Malignant (primary) neoplasm, unspecified: Secondary | ICD-10-CM

## 2013-06-27 DIAGNOSIS — C772 Secondary and unspecified malignant neoplasm of intra-abdominal lymph nodes: Secondary | ICD-10-CM

## 2013-06-27 DIAGNOSIS — Z5111 Encounter for antineoplastic chemotherapy: Secondary | ICD-10-CM

## 2013-06-27 LAB — CBC WITH DIFFERENTIAL/PLATELET
BASO%: 0.2 % (ref 0.0–2.0)
BASOS ABS: 0 10*3/uL (ref 0.0–0.1)
EOS%: 0.1 % (ref 0.0–7.0)
Eosinophils Absolute: 0 10*3/uL (ref 0.0–0.5)
HEMATOCRIT: 32.8 % — AB (ref 34.8–46.6)
HEMOGLOBIN: 11.1 g/dL — AB (ref 11.6–15.9)
LYMPH#: 0.4 10*3/uL — AB (ref 0.9–3.3)
LYMPH%: 9.7 % — AB (ref 14.0–49.7)
MCH: 34.8 pg — AB (ref 25.1–34.0)
MCHC: 33.9 g/dL (ref 31.5–36.0)
MCV: 102.6 fL — ABNORMAL HIGH (ref 79.5–101.0)
MONO#: 0.1 10*3/uL (ref 0.1–0.9)
MONO%: 1.3 % (ref 0.0–14.0)
NEUT#: 4 10*3/uL (ref 1.5–6.5)
NEUT%: 88.7 % — ABNORMAL HIGH (ref 38.4–76.8)
Platelets: 360 10*3/uL (ref 145–400)
RBC: 3.2 10*6/uL — ABNORMAL LOW (ref 3.70–5.45)
RDW: 20.5 % — ABNORMAL HIGH (ref 11.2–14.5)
WBC: 4.5 10*3/uL (ref 3.9–10.3)

## 2013-06-27 LAB — BASIC METABOLIC PANEL (CC13)
Anion Gap: 10 mEq/L (ref 3–11)
BUN: 12 mg/dL (ref 7.0–26.0)
CALCIUM: 10.3 mg/dL (ref 8.4–10.4)
CHLORIDE: 101 meq/L (ref 98–109)
CO2: 27 mEq/L (ref 22–29)
Creatinine: 0.7 mg/dL (ref 0.6–1.1)
Glucose: 141 mg/dl — ABNORMAL HIGH (ref 70–140)
Potassium: 4 mEq/L (ref 3.5–5.1)
Sodium: 138 mEq/L (ref 136–145)

## 2013-06-27 MED ORDER — DEXAMETHASONE SODIUM PHOSPHATE 20 MG/5ML IJ SOLN
INTRAMUSCULAR | Status: AC
  Filled 2013-06-27: qty 5

## 2013-06-27 MED ORDER — SODIUM CHLORIDE 0.9 % IV SOLN
422.4000 mg | Freq: Once | INTRAVENOUS | Status: AC
  Administered 2013-06-27: 420 mg via INTRAVENOUS
  Filled 2013-06-27: qty 42

## 2013-06-27 MED ORDER — ONDANSETRON 16 MG/50ML IVPB (CHCC)
INTRAVENOUS | Status: AC
  Filled 2013-06-27: qty 16

## 2013-06-27 MED ORDER — SODIUM CHLORIDE 0.9 % IJ SOLN
10.0000 mL | INTRAMUSCULAR | Status: DC | PRN
  Administered 2013-06-27: 10 mL
  Filled 2013-06-27: qty 10

## 2013-06-27 MED ORDER — ONDANSETRON 16 MG/50ML IVPB (CHCC)
16.0000 mg | Freq: Once | INTRAVENOUS | Status: AC
  Administered 2013-06-27: 16 mg via INTRAVENOUS

## 2013-06-27 MED ORDER — FAMOTIDINE IN NACL 20-0.9 MG/50ML-% IV SOLN
INTRAVENOUS | Status: AC
  Filled 2013-06-27: qty 50

## 2013-06-27 MED ORDER — DIPHENHYDRAMINE HCL 50 MG/ML IJ SOLN
INTRAMUSCULAR | Status: AC
  Filled 2013-06-27: qty 1

## 2013-06-27 MED ORDER — DIPHENHYDRAMINE HCL 50 MG/ML IJ SOLN
25.0000 mg | Freq: Once | INTRAMUSCULAR | Status: AC
  Administered 2013-06-27: 25 mg via INTRAVENOUS

## 2013-06-27 MED ORDER — HEPARIN SOD (PORK) LOCK FLUSH 100 UNIT/ML IV SOLN
500.0000 [IU] | Freq: Once | INTRAVENOUS | Status: AC | PRN
  Administered 2013-06-27: 500 [IU]
  Filled 2013-06-27: qty 5

## 2013-06-27 MED ORDER — SODIUM CHLORIDE 0.9 % IV SOLN
Freq: Once | INTRAVENOUS | Status: AC
  Administered 2013-06-27: 14:00:00 via INTRAVENOUS

## 2013-06-27 MED ORDER — SODIUM CHLORIDE 0.9 % IV SOLN
135.0000 mg/m2 | Freq: Once | INTRAVENOUS | Status: AC
  Administered 2013-06-27: 228 mg via INTRAVENOUS
  Filled 2013-06-27: qty 38

## 2013-06-27 MED ORDER — CARBOPLATIN CHEMO INTRADERMAL TEST DOSE 100MCG/0.02ML
100.0000 ug | Freq: Once | INTRADERMAL | Status: AC
  Administered 2013-06-27: 100 ug via INTRADERMAL
  Filled 2013-06-27: qty 0.01

## 2013-06-27 MED ORDER — FAMOTIDINE IN NACL 20-0.9 MG/50ML-% IV SOLN
20.0000 mg | Freq: Once | INTRAVENOUS | Status: AC
  Administered 2013-06-27: 20 mg via INTRAVENOUS

## 2013-06-27 MED ORDER — DEXAMETHASONE SODIUM PHOSPHATE 20 MG/5ML IJ SOLN
20.0000 mg | Freq: Once | INTRAMUSCULAR | Status: AC
  Administered 2013-06-27: 20 mg via INTRAVENOUS

## 2013-06-27 NOTE — Patient Instructions (Signed)
[facility] Discharge Instructions for Patients Receiving Chemotherapy  Today you received the following chemotherapy agents taxol, carboplatin  To help prevent nausea and vomiting after your treatment, we encourage you to take your nausea medication as needed   If you develop nausea and vomiting that is not controlled by your nausea medication, call the clinic.   BELOW ARE SYMPTOMS THAT SHOULD BE REPORTED IMMEDIATELY:  *FEVER GREATER THAN 100.5 F  *CHILLS WITH OR WITHOUT FEVER  NAUSEA AND VOMITING THAT IS NOT CONTROLLED WITH YOUR NAUSEA MEDICATION  *UNUSUAL SHORTNESS OF BREATH  *UNUSUAL BRUISING OR BLEEDING  TENDERNESS IN MOUTH AND THROAT WITH OR WITHOUT PRESENCE OF ULCERS  *URINARY PROBLEMS  *BOWEL PROBLEMS  UNUSUAL RASH Items with * indicate a potential emergency and should be followed up as soon as possible.  Feel free to call the clinic you have any questions or concerns. The clinic phone number is [phone].    

## 2013-06-27 NOTE — Progress Notes (Signed)
Carbo Skin Test: 1300--started 1305--negative 1315--negative 1330-negative   No redness, induration or irritation noted at side (right forearm).  SLJ

## 2013-06-30 ENCOUNTER — Other Ambulatory Visit: Payer: Self-pay | Admitting: Oncology

## 2013-07-04 ENCOUNTER — Telehealth: Payer: Self-pay | Admitting: Oncology

## 2013-07-04 ENCOUNTER — Other Ambulatory Visit (HOSPITAL_BASED_OUTPATIENT_CLINIC_OR_DEPARTMENT_OTHER): Payer: BC Managed Care – PPO

## 2013-07-04 ENCOUNTER — Encounter: Payer: Self-pay | Admitting: Oncology

## 2013-07-04 ENCOUNTER — Ambulatory Visit (HOSPITAL_BASED_OUTPATIENT_CLINIC_OR_DEPARTMENT_OTHER): Payer: BC Managed Care – PPO | Admitting: Oncology

## 2013-07-04 VITALS — BP 140/74 | HR 88 | Temp 98.9°F | Resp 18 | Ht 61.0 in | Wt 146.7 lb

## 2013-07-04 DIAGNOSIS — D6959 Other secondary thrombocytopenia: Secondary | ICD-10-CM

## 2013-07-04 DIAGNOSIS — N390 Urinary tract infection, site not specified: Secondary | ICD-10-CM

## 2013-07-04 DIAGNOSIS — C772 Secondary and unspecified malignant neoplasm of intra-abdominal lymph nodes: Secondary | ICD-10-CM

## 2013-07-04 DIAGNOSIS — R350 Frequency of micturition: Secondary | ICD-10-CM

## 2013-07-04 DIAGNOSIS — D6481 Anemia due to antineoplastic chemotherapy: Secondary | ICD-10-CM

## 2013-07-04 DIAGNOSIS — Z5189 Encounter for other specified aftercare: Secondary | ICD-10-CM

## 2013-07-04 DIAGNOSIS — G622 Polyneuropathy due to other toxic agents: Secondary | ICD-10-CM

## 2013-07-04 DIAGNOSIS — C801 Malignant (primary) neoplasm, unspecified: Secondary | ICD-10-CM

## 2013-07-04 DIAGNOSIS — T451X5A Adverse effect of antineoplastic and immunosuppressive drugs, initial encounter: Secondary | ICD-10-CM

## 2013-07-04 DIAGNOSIS — C569 Malignant neoplasm of unspecified ovary: Secondary | ICD-10-CM

## 2013-07-04 LAB — COMPREHENSIVE METABOLIC PANEL (CC13)
ALBUMIN: 3.5 g/dL (ref 3.5–5.0)
ALK PHOS: 69 U/L (ref 40–150)
ALT: 15 U/L (ref 0–55)
AST: 22 U/L (ref 5–34)
Anion Gap: 8 mEq/L (ref 3–11)
BUN: 8.7 mg/dL (ref 7.0–26.0)
CO2: 28 mEq/L (ref 22–29)
Calcium: 9.3 mg/dL (ref 8.4–10.4)
Chloride: 104 mEq/L (ref 98–109)
Creatinine: 0.7 mg/dL (ref 0.6–1.1)
Glucose: 104 mg/dl (ref 70–140)
POTASSIUM: 3.5 meq/L (ref 3.5–5.1)
SODIUM: 140 meq/L (ref 136–145)
Total Bilirubin: 0.44 mg/dL (ref 0.20–1.20)
Total Protein: 5.9 g/dL — ABNORMAL LOW (ref 6.4–8.3)

## 2013-07-04 LAB — CBC WITH DIFFERENTIAL/PLATELET
BASO%: 1.6 % (ref 0.0–2.0)
Basophils Absolute: 0 10*3/uL (ref 0.0–0.1)
EOS%: 1.6 % (ref 0.0–7.0)
Eosinophils Absolute: 0 10*3/uL (ref 0.0–0.5)
HCT: 30 % — ABNORMAL LOW (ref 34.8–46.6)
HGB: 10.1 g/dL — ABNORMAL LOW (ref 11.6–15.9)
LYMPH%: 37.9 % (ref 14.0–49.7)
MCH: 34.4 pg — AB (ref 25.1–34.0)
MCHC: 33.5 g/dL (ref 31.5–36.0)
MCV: 102.4 fL — AB (ref 79.5–101.0)
MONO#: 0 10*3/uL — ABNORMAL LOW (ref 0.1–0.9)
MONO%: 2.1 % (ref 0.0–14.0)
NEUT#: 1.1 10*3/uL — ABNORMAL LOW (ref 1.5–6.5)
NEUT%: 56.8 % (ref 38.4–76.8)
Platelets: 151 10*3/uL (ref 145–400)
RBC: 2.93 10*6/uL — AB (ref 3.70–5.45)
RDW: 17.8 % — AB (ref 11.2–14.5)
WBC: 2 10*3/uL — ABNORMAL LOW (ref 3.9–10.3)
lymph#: 0.7 10*3/uL — ABNORMAL LOW (ref 0.9–3.3)

## 2013-07-04 MED ORDER — CIPROFLOXACIN HCL 250 MG PO TABS: 250.0000 mg | ORAL_TABLET | Freq: Two times a day (BID) | ORAL | Status: DC

## 2013-07-04 MED ORDER — FILGRASTIM 300 MCG/0.5ML IJ SOLN: 300.0000 ug | Freq: Once | INTRAMUSCULAR | Status: DC

## 2013-07-04 MED ORDER — TBO-FILGRASTIM 300 MCG/0.5ML ~~LOC~~ SOSY
300.0000 ug | PREFILLED_SYRINGE | Freq: Once | SUBCUTANEOUS | Status: AC
  Administered 2013-07-04: 300 ug via SUBCUTANEOUS
  Filled 2013-07-04: qty 0.5

## 2013-07-04 MED ORDER — HYDROCODONE-ACETAMINOPHEN 5-325 MG PO TABS: ORAL_TABLET | ORAL | Status: DC

## 2013-07-04 NOTE — Telephone Encounter (Signed)
, °

## 2013-07-04 NOTE — Progress Notes (Signed)
OFFICE PROGRESS NOTE   07/04/2013   Physicians:ClarkePearson, Quillian Quince; Evalee Mutton, Marlene Bast, Lorriane Shire; Dahlstedt, Annie Main   INTERVAL HISTORY:  Patient is seen, together with daughter, in follow up of ongoing neoadjuvant chemotherapy for gyn malignancy, having had first q 3 week dosing of carboplatin and taxol on 06-27-13, which we were trying instead of dose dense regimen of taxol carboplatin that she had previously, in attempt to improve tolerance. However, she has had a difficult time with severe taxol aches, more nausea and increase in peripheral neuropathy symptoms in feet since most recent chemotherapy. Additionally, she may have UTI, with urinary frequency and severe bladder pressure symptoms for past ~ 2 days. She has not had fever. Counts are dropping, tho she is not quite neutropenic today, and she will begin neupogen now (this chosen in preference to neulasta due to taxol aches).  Feet are "better in AMs" but numb by later in the day to the point that she has to listen for sound of slippers hitting the floor to know when she has stepped. I have told her that we will not continue taxol with this degree of peripheral neuropathy.  Last antibiotics for UTI were nitrofurantoin by Dr Diona Fanti in late Jan.  She has PAC.   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 Plan neoadjuvant chemotherapy prior to any surgical staging.  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 She had been generally stable until contracting norovirus in May 2014, with prolonged illness and weight loss then. She had yearly PE and first gyn exam by Dr Leo Grosser in June 2014. In July 2014 she developed RLE swelling and pain, with venous dopplers negative. She developed nausea, vomiting and fever, with abdominal US 01-10-13 which showed  mild right hydronephrosis. She was admitted to [facility] from 8-20 thru 01-17-13; with evaluation including CT AP x2; consultations by Drs Diona Fanti, Benay Spice, Cornett and ClarkePearson;right ureteral stent placement; and US biopsy of left inguinal lymph node. Pathology [phone] carcinoma with immunohistochemical stains consistent with serous carcinoma of gyn origin. Dr Josephina Shih recommended dose dense taxol carboplatin, with possible interval debulking depending on response after ~ 3 cycles. Cycle 1 day 1 dose dense taxol carboplatin was given on 01-31-13; day 15 cycle 1 was held with platelets 49k. Carboplatin dose was decreased to AUC=4 with cycle 2 on 02-21-13; day 15 cycle 2 was held due to symptomatic anemia, transfusion 2 units PRBCs on 03-07-13. Day 1 cycle 3 was given 03-14-13, day 8 on 10-29 + neupogen, and day 15 held 11-5 for platelets 51k. CT AP 04-10-13 showed improvement in pelvic mass and adenopathy, with recommendation for 3 additional cycles of chemotherapy before possible debulking surgery. Day 15 cycle 4 was held on 04-18-13 due to hemoglobin 7.8, with PRBC transfusion given.       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 She is drinking fluids including gatorade, has not eaten much. Hydrocodone x total 3 doses helped with aches.  Remainder of 10 point Review of Systems negative.  Objective:  Vital signs in last 24 hours:  BP 140/74  Pulse 88  Temp(Src)  98.9 F (37.2 C) (Oral)  Resp 18  Ht 5\' 1"  (1.549 m)  Wt 146 lb 11.2 oz (66.543 kg)  BMI 27.73 kg/m2 Crying from bladder pain as exam underway. Able to void only a few cc's. Looks uncomfortable but not in acute distress. Alert, oriented and appropriate.  Ambulatory without assistance.  Alopecia  HEENT:PERRL, sclerae not icteric. Oral mucosa moist without lesions, posterior pharynx clear.  Neck supple. No JVD.  Lymphatics:no cervical,suraclavicular, axillary or inguinal adenopathy Resp: clear to auscultation bilaterally and normal percussion bilaterally Cardio: regular rate and rhythm. No gallop. GI: soft, nontender, not distended, no mass or organomegaly. Normally active bowel sounds. Right sided ileostomy with parastomal hernia soft, liquid stool in bag. Musculoskeletal/ Extremities: tight swelling RLE, no cords or tenderness, minimal swelling LLE all stable. Neuro: peripheral neuropathy feet as noted.. Otherwise nonfocal Skin without rash, ecchymosis, petechiae. Portacath-without erythema or tenderness  Lab Results:  Results for orders placed in visit on 07/04/13  CBC WITH DIFFERENTIAL      Result Value Ref Range   WBC 2.0 (*) 3.9 - 10.3 10e3/uL   NEUT# 1.1 (*) 1.5 - 6.5 10e3/uL   HGB 10.1 (*) 11.6 - 15.9 g/dL   HCT 30.0 (*) 34.8 - 46.6 %   Platelets 151  145 - 400 10e3/uL   MCV 102.4 (*) 79.5 - 101.0 fL   MCH 34.4 (*) 25.1 - 34.0 pg   MCHC 33.5  31.5 - 36.0 g/dL   RBC 2.93 (*) 3.70 - 5.45 10e6/uL   RDW 17.8 (*) 11.2 - 14.5 %   lymph# 0.7 (*) 0.9 - 3.3 10e3/uL   MONO# 0.0 (*) 0.1 - 0.9 10e3/uL   Eosinophils Absolute 0.0  0.0 - 0.5 10e3/uL   Basophils Absolute 0.0  0.0 - 0.1 10e3/uL   NEUT% 56.8  38.4 - 76.8 %   LYMPH% 37.9  14.0 - 49.7 %   MONO% 2.1  0.0 - 14.0 %   EOS% 1.6  0.0 - 7.0 %   BASO% 1.6  0.0 - 2.0 %  COMPREHENSIVE METABOLIC PANEL (UX32)      Result Value Ref Range   Sodium 140  136 - 145 mEq/L   Potassium 3.5  3.5 - 5.1 mEq/L   Chloride 104  98 - 109 mEq/L   CO2 28  22 - 29 mEq/L   Glucose 104  70 - 140 mg/dl   BUN 8.7  7.0 - 26.0 mg/dL   Creatinine 0.7  0.6 - 1.1 mg/dL   Total Bilirubin 0.44  0.20 - 1.20 mg/dL   Alkaline Phosphatase 69  40 - 150 U/L   AST 22  5 - 34 U/L   ALT 15  0 - 55 U/L   Total  Protein 5.9 (*) 6.4 - 8.3 g/dL   Albumin 3.5  3.5 - 5.0 g/dL   Calcium 9.3  8.4 - 10.4 mg/dL   Anion Gap 8  3 - 11 mEq/L    CMET resulted after visit. Not sufficient urine for UA, but culture sent.  Studies/Results:  No results found.  Medications: I have reviewed the patient's current medications. She is given neupogen 300 mcg today and will have this 2-12; MD will dose further based on repeat CBC 2-13. Hydrocodone 5-325 refilled as this has helped with aches and bladder symptoms, patient just finishing prescription for #30 written Aug 2014. Cipro 250 mg bid x 7 days to start empirically today, following urine culture. Peripheral neuropathy symptoms in feet will no longer allow  taxol. I have told patient that we will change to taxotere for future chemo; she has not had teaching on taxotere yet. We may do single agent [place] only for next treatment, not discussed yet.  DISCUSSION: Patient understands all support interventions begun now and is aware that we will make adjustments to further treatment cycles based on these problems. She knows to call if needed before next visits. She will have neupogen injection at this office tomorrow and CBC/ possible further neupogen 2-13. Next chemo due March 18. I will see her again at least 2-16.  Assessment/Plan:  1. 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 CA125 marker is improved. Plan 2 additional cycles beyond 06-27-13 treatment, then repeat imaging and follow up with Dr Josephina Shih. Needs skin testing now prior to each carboplatin. Chemo side effects unfortunately much worse with first q 3 week dosing of taxol and carboplatin on 06-27-13, plan as above. 2.peripheral neuropathy in feet now worse to point that we will not use further taxol.  Plan as above. 3.Probable UTI by symptoms: Cipro awaiting culture information. Right JJ stent in. 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 Stent to remain in for now  5.multiple blood transfusions with ulcerative colitis previously, and PRBCs total 4 units during this chemo, from 10-15 thru 05-09-13. Last iron studies 02-19-13 had serum iron 90, %sat 40  6. PAC in  7.flu vaccine done   8.rash to PCN  9. Leukopenia, anemia, thrombocytopenia related to chemotherapy.  10.Ulcerative colitis: post total colectomy 1990 with ileostomy.  Extremely sensitive to oral contrast with CTs causing diarrhea, needs water based oral contrast regimen in radiology. Hypokalemia: related to ileostomy output, improved  11.RLE swelling: no DVT by prior evaluations      Elfreida Heggs P, MD   07/04/2013, 5:02 PM

## 2013-07-05 ENCOUNTER — Ambulatory Visit (HOSPITAL_BASED_OUTPATIENT_CLINIC_OR_DEPARTMENT_OTHER): Payer: BC Managed Care – PPO

## 2013-07-05 ENCOUNTER — Telehealth: Payer: Self-pay

## 2013-07-05 VITALS — BP 146/73 | HR 111 | Temp 98.2°F

## 2013-07-05 DIAGNOSIS — Z5189 Encounter for other specified aftercare: Secondary | ICD-10-CM

## 2013-07-05 DIAGNOSIS — C801 Malignant (primary) neoplasm, unspecified: Secondary | ICD-10-CM

## 2013-07-05 DIAGNOSIS — C772 Secondary and unspecified malignant neoplasm of intra-abdominal lymph nodes: Secondary | ICD-10-CM

## 2013-07-05 LAB — CA 125: CA 125: 566.7 U/mL — ABNORMAL HIGH (ref 0.0–30.2)

## 2013-07-05 MED ORDER — FILGRASTIM 300 MCG/0.5ML IJ SOLN: 300.0000 ug | Freq: Once | INTRAMUSCULAR | Status: DC

## 2013-07-05 MED ORDER — TBO-FILGRASTIM 300 MCG/0.5ML ~~LOC~~ SOSY
300.0000 ug | PREFILLED_SYRINGE | Freq: Once | SUBCUTANEOUS | Status: AC
  Administered 2013-07-05: 300 ug via SUBCUTANEOUS
  Filled 2013-07-05: qty 0.5

## 2013-07-05 NOTE — Telephone Encounter (Signed)
Ms. Mcnicholas states that her pain with urination is much less with the 2 doses of cipro.  She only took 1 pain pill last evening. Urine culture has not incubated long enough to be read.  Needs at least 25 hours.  It was received last evening 1730. Will follow up 07-06-13 afternoon with the lab to see if there is any growth on the culture.

## 2013-07-06 ENCOUNTER — Ambulatory Visit (HOSPITAL_BASED_OUTPATIENT_CLINIC_OR_DEPARTMENT_OTHER): Payer: BC Managed Care – PPO

## 2013-07-06 ENCOUNTER — Telehealth: Payer: Self-pay | Admitting: Oncology

## 2013-07-06 ENCOUNTER — Other Ambulatory Visit: Payer: Self-pay | Admitting: Oncology

## 2013-07-06 ENCOUNTER — Telehealth: Payer: Self-pay | Admitting: *Deleted

## 2013-07-06 VITALS — BP 120/61 | HR 91 | Temp 98.8°F

## 2013-07-06 DIAGNOSIS — C801 Malignant (primary) neoplasm, unspecified: Secondary | ICD-10-CM

## 2013-07-06 DIAGNOSIS — C772 Secondary and unspecified malignant neoplasm of intra-abdominal lymph nodes: Secondary | ICD-10-CM

## 2013-07-06 DIAGNOSIS — C569 Malignant neoplasm of unspecified ovary: Secondary | ICD-10-CM

## 2013-07-06 DIAGNOSIS — Z5189 Encounter for other specified aftercare: Secondary | ICD-10-CM

## 2013-07-06 LAB — CBC WITH DIFFERENTIAL/PLATELET
BASO%: 1.1 % (ref 0.0–2.0)
Basophils Absolute: 0 10*3/uL (ref 0.0–0.1)
EOS%: 1 % (ref 0.0–7.0)
Eosinophils Absolute: 0 10*3/uL (ref 0.0–0.5)
HEMATOCRIT: 29.4 % — AB (ref 34.8–46.6)
HGB: 9.9 g/dL — ABNORMAL LOW (ref 11.6–15.9)
LYMPH#: 0.6 10*3/uL — AB (ref 0.9–3.3)
LYMPH%: 31 % (ref 14.0–49.7)
MCH: 34.2 pg — ABNORMAL HIGH (ref 25.1–34.0)
MCHC: 33.5 g/dL (ref 31.5–36.0)
MCV: 102.2 fL — ABNORMAL HIGH (ref 79.5–101.0)
MONO#: 0.3 10*3/uL (ref 0.1–0.9)
MONO%: 15.7 % — ABNORMAL HIGH (ref 0.0–14.0)
NEUT#: 1 10*3/uL — ABNORMAL LOW (ref 1.5–6.5)
NEUT%: 51.2 % (ref 38.4–76.8)
Platelets: 110 10*3/uL — ABNORMAL LOW (ref 145–400)
RBC: 2.88 10*6/uL — ABNORMAL LOW (ref 3.70–5.45)
RDW: 17.6 % — AB (ref 11.2–14.5)
WBC: 1.9 10*3/uL — AB (ref 3.9–10.3)

## 2013-07-06 LAB — URINE CULTURE

## 2013-07-06 MED ORDER — FILGRASTIM 300 MCG/0.5ML IJ SOLN: 300.0000 ug | Freq: Once | INTRAMUSCULAR | Status: DC

## 2013-07-06 MED ORDER — TBO-FILGRASTIM 300 MCG/0.5ML ~~LOC~~ SOSY
300.0000 ug | PREFILLED_SYRINGE | Freq: Once | SUBCUTANEOUS | Status: AC
  Administered 2013-07-06: 300 ug via SUBCUTANEOUS
  Filled 2013-07-06: qty 0.5

## 2013-07-06 NOTE — Telephone Encounter (Signed)
sw pt gv appt for 07/07/13@ 11:15am. Pt is aware...td

## 2013-07-06 NOTE — Telephone Encounter (Signed)
Medical Oncology  Let patient know that urine culture from 07-04-13 does show infection (E coli > 100K) sensitive to present Cipro.  Bladder symptoms much better, no fever. She is aware of neupogen injection tomorrow. Godfrey Pick, MD

## 2013-07-07 ENCOUNTER — Ambulatory Visit (HOSPITAL_BASED_OUTPATIENT_CLINIC_OR_DEPARTMENT_OTHER): Payer: BC Managed Care – PPO

## 2013-07-07 VITALS — BP 128/57 | HR 94 | Temp 98.6°F | Resp 18

## 2013-07-07 DIAGNOSIS — C801 Malignant (primary) neoplasm, unspecified: Secondary | ICD-10-CM

## 2013-07-07 DIAGNOSIS — Z5189 Encounter for other specified aftercare: Secondary | ICD-10-CM

## 2013-07-07 DIAGNOSIS — C772 Secondary and unspecified malignant neoplasm of intra-abdominal lymph nodes: Secondary | ICD-10-CM

## 2013-07-07 DIAGNOSIS — C569 Malignant neoplasm of unspecified ovary: Secondary | ICD-10-CM

## 2013-07-07 MED ORDER — TBO-FILGRASTIM 300 MCG/0.5ML ~~LOC~~ SOSY
300.0000 ug | PREFILLED_SYRINGE | Freq: Once | SUBCUTANEOUS | Status: AC
  Administered 2013-07-07: 300 ug via SUBCUTANEOUS

## 2013-07-07 NOTE — Patient Instructions (Signed)
Tbo-Filgrastim injection What is this medicine? TBO-FILGRASTIM is used to help decrease the time you have low amounts of white blood cells after cancer treatment. It helps the body make more white blood cells. Increasing the amount of white blood cells helps to decrease the risk of infection and fever. This medicine may be used for other purposes; ask your health care provider or pharmacist if you have questions. COMMON BRAND NAME(S): Granix What should I tell my health care provider before I take this medicine? They need to know if you have any of these conditions: -history of blood diseases, like sickle cell anemia or leukemia -an unusual or allergic reaction to tbo-filgrastim, filgrastim, pegfilgrastim, other medicines, foods, dyes, or preservatives -pregnant or trying to get pregnant -breast-feeding How should I use this medicine? This medicine is for injection under the skin. It is given by a health care professional in a hospital or clinic setting. Talk to your pediatrician regarding the use of this medicine in children. Special care may be needed. Overdosage: If you think you've taken too much of this medicine contact a poison control center or emergency room at once. Overdosage: If you think you have taken too much of this medicine contact a poison control center or emergency room at once. NOTE: This medicine is only for you. Do not share this medicine with others. What if I miss a dose? Keep appointments for follow-up doses as directed. It is important not to miss your dose. Call your doctor or health care professional if you are unable to keep an appointment. What may interact with this medicine? -lithium This list may not describe all possible interactions. Give your health care provider a list of all the medicines, herbs, non-prescription drugs, or dietary supplements you use. Also tell them if you smoke, drink alcohol, or use illegal drugs. Some items may interact with your  medicine. What should I watch for while using this medicine? Your condition will be monitored carefully while you are receiving this medicine. You may need blood work done while you are taking this medicine. Avoid taking products that contain aspirin, acetaminophen, ibuprofen, naproxen, or ketoprofen unless instructed by your doctor. These medicines may hide a fever. Call your doctor or health care professional for advice if you get a fever, chills or sore throat, or other symptoms of a cold or flu. Do not treat yourself. What side effects may I notice from receiving this medicine? Side effects that you should report to your doctor or health care professional as soon as possible: -allergic reactions like skin rash, itching or hives, swelling of the face, lips, or tongue -breathing problems -fever -stomach pain  Side effects that usually do not require medical attention (Report these to your doctor or health care professional if they continue or are bothersome.): -bone pain This list may not describe all possible side effects. Call your doctor for medical advice about side effects. You may report side effects to FDA at 1-800-FDA-1088. Where should I keep my medicine? This drug is given in a hospital or clinic and will not be stored at home. NOTE: This sheet is a summary. It may not cover all possible information. If you have questions about this medicine, talk to your doctor, pharmacist, or health care provider.  2014, Elsevier/Gold Standard. (2011-01-27 16:41:24)  

## 2013-07-09 ENCOUNTER — Telehealth: Payer: Self-pay | Admitting: Oncology

## 2013-07-09 ENCOUNTER — Encounter: Payer: Self-pay | Admitting: Oncology

## 2013-07-09 ENCOUNTER — Ambulatory Visit (HOSPITAL_BASED_OUTPATIENT_CLINIC_OR_DEPARTMENT_OTHER): Payer: BC Managed Care – PPO | Admitting: Oncology

## 2013-07-09 ENCOUNTER — Other Ambulatory Visit (HOSPITAL_BASED_OUTPATIENT_CLINIC_OR_DEPARTMENT_OTHER): Payer: BC Managed Care – PPO

## 2013-07-09 ENCOUNTER — Telehealth: Payer: Self-pay | Admitting: *Deleted

## 2013-07-09 VITALS — BP 118/59 | HR 88 | Temp 98.9°F | Resp 18 | Ht 61.0 in | Wt 147.2 lb

## 2013-07-09 DIAGNOSIS — G609 Hereditary and idiopathic neuropathy, unspecified: Secondary | ICD-10-CM

## 2013-07-09 DIAGNOSIS — D6481 Anemia due to antineoplastic chemotherapy: Secondary | ICD-10-CM

## 2013-07-09 DIAGNOSIS — C569 Malignant neoplasm of unspecified ovary: Secondary | ICD-10-CM

## 2013-07-09 DIAGNOSIS — D6959 Other secondary thrombocytopenia: Secondary | ICD-10-CM

## 2013-07-09 DIAGNOSIS — N390 Urinary tract infection, site not specified: Secondary | ICD-10-CM

## 2013-07-09 DIAGNOSIS — T451X5A Adverse effect of antineoplastic and immunosuppressive drugs, initial encounter: Secondary | ICD-10-CM

## 2013-07-09 DIAGNOSIS — M7989 Other specified soft tissue disorders: Secondary | ICD-10-CM

## 2013-07-09 DIAGNOSIS — C782 Secondary malignant neoplasm of pleura: Secondary | ICD-10-CM

## 2013-07-09 DIAGNOSIS — C801 Malignant (primary) neoplasm, unspecified: Secondary | ICD-10-CM

## 2013-07-09 DIAGNOSIS — D72819 Decreased white blood cell count, unspecified: Secondary | ICD-10-CM

## 2013-07-09 DIAGNOSIS — B952 Enterococcus as the cause of diseases classified elsewhere: Secondary | ICD-10-CM

## 2013-07-09 LAB — COMPREHENSIVE METABOLIC PANEL (CC13)
ALT: 12 U/L (ref 0–55)
ANION GAP: 10 meq/L (ref 3–11)
AST: 20 U/L (ref 5–34)
Albumin: 3.4 g/dL — ABNORMAL LOW (ref 3.5–5.0)
Alkaline Phosphatase: 72 U/L (ref 40–150)
BILIRUBIN TOTAL: 0.36 mg/dL (ref 0.20–1.20)
BUN: 6.6 mg/dL — ABNORMAL LOW (ref 7.0–26.0)
CHLORIDE: 102 meq/L (ref 98–109)
CO2: 30 meq/L — AB (ref 22–29)
Calcium: 9.8 mg/dL (ref 8.4–10.4)
Creatinine: 0.7 mg/dL (ref 0.6–1.1)
GLUCOSE: 107 mg/dL (ref 70–140)
Potassium: 3.3 mEq/L — ABNORMAL LOW (ref 3.5–5.1)
SODIUM: 141 meq/L (ref 136–145)
Total Protein: 6.1 g/dL — ABNORMAL LOW (ref 6.4–8.3)

## 2013-07-09 LAB — CBC WITH DIFFERENTIAL/PLATELET
BASO%: 0.3 % (ref 0.0–2.0)
Basophils Absolute: 0 10*3/uL (ref 0.0–0.1)
EOS ABS: 0 10*3/uL (ref 0.0–0.5)
EOS%: 0.4 % (ref 0.0–7.0)
HEMATOCRIT: 31.1 % — AB (ref 34.8–46.6)
HGB: 10.4 g/dL — ABNORMAL LOW (ref 11.6–15.9)
LYMPH#: 1 10*3/uL (ref 0.9–3.3)
LYMPH%: 16.5 % (ref 14.0–49.7)
MCH: 34.2 pg — ABNORMAL HIGH (ref 25.1–34.0)
MCHC: 33.5 g/dL (ref 31.5–36.0)
MCV: 102 fL — ABNORMAL HIGH (ref 79.5–101.0)
MONO#: 0.8 10*3/uL (ref 0.1–0.9)
MONO%: 14 % (ref 0.0–14.0)
NEUT%: 68.8 % (ref 38.4–76.8)
NEUTROS ABS: 4.1 10*3/uL (ref 1.5–6.5)
Platelets: 90 10*3/uL — ABNORMAL LOW (ref 145–400)
RBC: 3.05 10*6/uL — AB (ref 3.70–5.45)
RDW: 18.3 % — ABNORMAL HIGH (ref 11.2–14.5)
WBC: 6 10*3/uL (ref 3.9–10.3)

## 2013-07-09 NOTE — Progress Notes (Signed)
OFFICE PROGRESS NOTE   07/09/2013   Physicians:ClarkePearson, Quillian Quince; Evalee Mutton, Marlene Bast, Floy Sabina, Annie Main   INTERVAL HISTORY:  Patient is seen, alone for visit, in follow up of ongoing neoadjuvant chemotherapy for gyn malignancy. 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 Counts are improving from taxol carboplatin given 06-27-13, and E coli UTI symptoms have resolved on cipro, which will complete 07-10-13. Nausea is better, appetite better, no fever, energy improved today.   She has PAC.   ONCOLOGIC HISTORY Patient presented with RLE swelling negative for DVT July 2014, and right hydronephrosis. She has hx ulcerative colitis with total colectomy and ileostomy 1990. Biopsy of left inguinal node ([id number]) found carcinoma consistent with serous gyn primary. After consultations with multiple specialists, she began neoadjuvant dose dense taxol carboplatin 01-31-13. That treatment was complicated by cytopenias requiring PRBCs, gCSF and dose reductions, but scans after 3 cycles showed improvement in pelvic mass and adenopathy. She had 3 additional cycles, with ongoing problems with cytopenias but progressive decrease in CA 125 from 5626 in 12-2012 to 596 by Jan 2015. CT 06-14-13 had ~ stable pelvic mass and adenopathy. Dr Josephina Shih recommended another 3 cycles chemotherapy, regimen changed to q 3 week taxol carboplatin on 06-27-13, which unfortunately caused worsening of peripheral neuropathy and severe taxol aches. CA 125 was 495 after that treatment.Taxotere will be substituted for taxol beginning 07-18-13 due to neuropathy.    Review of systems as above, also: No fever, no dysuria, no hematuria or other bleeding. Ileostomy output at baseline, empties bag 4-5x daily. No significant nausea now. Some feeling in feet, but still listens for her own footsteps to assist ambulating. Remainder of 10 point Review of  Systems negative.  Objective:  Vital signs in last 24 hours:  BP 118/59  Pulse 88  Temp(Src) 98.9 F (37.2 C) (Oral)  Resp 18  Ht 5\' 1"  (1.549 m)  Wt 147 lb 3.2 oz (66.769 kg)  BMI 27.83 kg/m2 Weight stable. Alert, oriented, appropriate, very pleasant as always. Ambulatory without assistance.  Alopecia  HEENT:PERRL, sclerae not icteric. Oral mucosa moist without lesions, posterior pharynx clear.  Neck supple. No JVD.  Lymphatics:no cervical,suraclavicular or inguinal adenopathy Resp: clear to auscultation bilaterally and normal percussion bilaterally Cardio: regular rate and rhythm. No gallop. GI: soft, nontender, not distended, no mass or organomegaly. Normally active bowel sounds. Brown liquid in ileostomy bag, parastomal hernia. Musculoskeletal/ Extremities: 2+ swelling RLE as previously, no cords or tenderness Neuro: peripheral neuropathy feet bilaterally. Skin without rash, ecchymosis, petechiae Portacath-without erythema or tenderness  Lab Results:  Results for orders placed in visit on 07/09/13  CBC WITH DIFFERENTIAL      Result Value Ref Range   WBC 6.0  3.9 - 10.3 10e3/uL   NEUT# 4.1  1.5 - 6.5 10e3/uL   HGB 10.4 (*) 11.6 - 15.9 g/dL   HCT 31.1 (*) 34.8 - 46.6 %   Platelets 90 (*) 145 - 400 10e3/uL   MCV 102.0 (*) 79.5 - 101.0 fL   MCH 34.2 (*) 25.1 - 34.0 pg   MCHC 33.5  31.5 - 36.0 g/dL   RBC 3.05 (*) 3.70 - 5.45 10e6/uL   RDW 18.3 (*) 11.2 - 14.5 %   lymph# 1.0  0.9 - 3.3 10e3/uL   MONO# 0.8  0.1 - 0.9 10e3/uL   Eosinophils Absolute 0.0  0.0 - 0.5 10e3/uL   Basophils Absolute 0.0  0.0 - 0.1 10e3/uL   NEUT% 68.8  38.4 - 76.8 %   LYMPH% 16.5  14.0 - 49.7 %   MONO% 14.0  0.0 - 14.0 %   EOS% 0.4  0.0 - 7.0 %   BASO% 0.3  0.0 - 2.0 %  COMPREHENSIVE METABOLIC PANEL (IE33)      Result Value Ref Range   Sodium 141  136 - 145 mEq/L   Potassium 3.3 (*) 3.5 - 5.1 mEq/L   Chloride 102  98 - 109 mEq/L   CO2 30 (*) 22 - 29 mEq/L   Glucose 107  70 - 140 mg/dl    BUN 6.6 (*) 7.0 - 26.0 mg/dL   Creatinine 0.7  0.6 - 1.1 mg/dL   Total Bilirubin 0.36  0.20 - 1.20 mg/dL   Alkaline Phosphatase 72  40 - 150 U/L   AST 20  5 - 34 U/L   ALT 12  0 - 55 U/L   Total Protein 6.1 (*) 6.4 - 8.3 g/dL   Albumin 3.4 (*) 3.5 - 5.0 g/dL   Calcium 9.8  8.4 - 10.4 mg/dL   Anion Gap 10  3 - 11 mEq/L    CA 125 available after visit 495  Will repeat urine culture 07-18-13.  Studies/Results:  No results found.  Medications: I have reviewed the patient's current medications. She will complete cipro tomorrow. She has had taxotere teaching by RN now, including use of decadron 8 mg bid x 3 days beginning day prior to taxotere and use of neulasta day after taxotere. She needs carbo skin test prior to each treatment and I have added EMEND due to nausea symptoms.  DISCUSSION: Will treat with taxotere and carboplatin 07-18-13 as long as ANC >=1.5 and plt >=100k, with usual decadron and neulasta. She will see PA with counts on ~ 07-25-13 and I will see her at least 3-16 prior to next chemo 3-18. We plan repeat scans and to see Dr Josephina Shih after 3-18 treatment. Patient has had all questions answered and is in agreement with plan.  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 Plan 2 cycles taxotere carboplatin then reevaluation as above. 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 3.E coli UTI 07-04-13, sensitive to Cipro, clinically much improved. Recheck culture 07-18-13.  Right JJ stent to remain in for now (very difficult placement initially)  4.post total colectomy for ulcerative colitis 1990, with ileostomy 5.multiple blood transfusions with ulcerative colitis previously, and PRBCs total 4 units during this chemo, from 10-15 thru 05-09-13. Last  iron studies 02-19-13 had serum iron 90, %sat 40  6. PAC in  7.flu vaccine done  8.rash to PCN  9. Leukopenia, anemia, thrombocytopenia related to chemotherapy.  10. Extremely sensitive to oral contrast with CTs causing diarrhea, needs water based oral contrast regimen in radiology. Hypokalemia: related to ileostomy output, generally manages with diet. 11.RLE swelling: no DVT by prior evaluations    Emmy Keng P, MD   07/09/2013, 2:54 PM

## 2013-07-09 NOTE — Telephone Encounter (Signed)
, °

## 2013-07-09 NOTE — Telephone Encounter (Signed)
Per staff message and POF I have scheduled appts.  JMW  

## 2013-07-10 ENCOUNTER — Other Ambulatory Visit: Payer: Self-pay | Admitting: Oncology

## 2013-07-10 LAB — CA 125: CA 125: 494.8 U/mL — AB (ref 0.0–30.2)

## 2013-07-13 ENCOUNTER — Ambulatory Visit: Payer: BC Managed Care – PPO | Admitting: Oncology

## 2013-07-13 ENCOUNTER — Other Ambulatory Visit: Payer: BC Managed Care – PPO

## 2013-07-18 ENCOUNTER — Other Ambulatory Visit (HOSPITAL_BASED_OUTPATIENT_CLINIC_OR_DEPARTMENT_OTHER): Payer: BC Managed Care – PPO

## 2013-07-18 ENCOUNTER — Ambulatory Visit (HOSPITAL_BASED_OUTPATIENT_CLINIC_OR_DEPARTMENT_OTHER): Payer: BC Managed Care – PPO

## 2013-07-18 VITALS — BP 113/68 | HR 80 | Temp 98.3°F | Resp 18

## 2013-07-18 DIAGNOSIS — C801 Malignant (primary) neoplasm, unspecified: Secondary | ICD-10-CM

## 2013-07-18 DIAGNOSIS — B952 Enterococcus as the cause of diseases classified elsewhere: Secondary | ICD-10-CM

## 2013-07-18 DIAGNOSIS — C569 Malignant neoplasm of unspecified ovary: Secondary | ICD-10-CM

## 2013-07-18 DIAGNOSIS — N390 Urinary tract infection, site not specified: Secondary | ICD-10-CM

## 2013-07-18 DIAGNOSIS — Z5111 Encounter for antineoplastic chemotherapy: Secondary | ICD-10-CM

## 2013-07-18 DIAGNOSIS — C772 Secondary and unspecified malignant neoplasm of intra-abdominal lymph nodes: Secondary | ICD-10-CM

## 2013-07-18 LAB — CBC WITH DIFFERENTIAL/PLATELET
BASO%: 0 % (ref 0.0–2.0)
BASOS ABS: 0 10*3/uL (ref 0.0–0.1)
EOS%: 0 % (ref 0.0–7.0)
Eosinophils Absolute: 0 10*3/uL (ref 0.0–0.5)
HCT: 33.2 % — ABNORMAL LOW (ref 34.8–46.6)
HEMOGLOBIN: 11 g/dL — AB (ref 11.6–15.9)
LYMPH#: 0.4 10*3/uL — AB (ref 0.9–3.3)
LYMPH%: 5.3 % — ABNORMAL LOW (ref 14.0–49.7)
MCH: 33.4 pg (ref 25.1–34.0)
MCHC: 33.1 g/dL (ref 31.5–36.0)
MCV: 100.9 fL (ref 79.5–101.0)
MONO#: 0.3 10*3/uL (ref 0.1–0.9)
MONO%: 3.7 % (ref 0.0–14.0)
NEUT#: 6.5 10*3/uL (ref 1.5–6.5)
NEUT%: 91 % — ABNORMAL HIGH (ref 38.4–76.8)
Platelets: 141 10*3/uL — ABNORMAL LOW (ref 145–400)
RBC: 3.29 10*6/uL — ABNORMAL LOW (ref 3.70–5.45)
RDW: 16.9 % — AB (ref 11.2–14.5)
WBC: 7.1 10*3/uL (ref 3.9–10.3)
nRBC: 0 % (ref 0–0)

## 2013-07-18 MED ORDER — DEXAMETHASONE SODIUM PHOSPHATE 20 MG/5ML IJ SOLN
INTRAMUSCULAR | Status: AC
  Filled 2013-07-18: qty 5

## 2013-07-18 MED ORDER — SODIUM CHLORIDE 0.9 % IV SOLN
60.0000 mg/m2 | Freq: Once | INTRAVENOUS | Status: AC
  Administered 2013-07-18: 100 mg via INTRAVENOUS
  Filled 2013-07-18: qty 10

## 2013-07-18 MED ORDER — HEPARIN SOD (PORK) LOCK FLUSH 100 UNIT/ML IV SOLN
500.0000 [IU] | Freq: Once | INTRAVENOUS | Status: AC | PRN
  Administered 2013-07-18: 500 [IU]
  Filled 2013-07-18: qty 5

## 2013-07-18 MED ORDER — SODIUM CHLORIDE 0.9 % IV SOLN
460.0000 mg | Freq: Once | INTRAVENOUS | Status: AC
  Administered 2013-07-18: 460 mg via INTRAVENOUS
  Filled 2013-07-18: qty 46

## 2013-07-18 MED ORDER — DEXAMETHASONE SODIUM PHOSPHATE 20 MG/5ML IJ SOLN
20.0000 mg | Freq: Once | INTRAMUSCULAR | Status: AC
Start: 2013-07-18 — End: 2013-07-18
  Administered 2013-07-18: 20 mg via INTRAVENOUS

## 2013-07-18 MED ORDER — SODIUM CHLORIDE 0.9 % IJ SOLN
3.0000 mL | INTRAMUSCULAR | Status: DC | PRN
  Filled 2013-07-18: qty 10

## 2013-07-18 MED ORDER — CARBOPLATIN CHEMO INTRADERMAL TEST DOSE 100MCG/0.02ML
100.0000 ug | Freq: Once | INTRADERMAL | Status: AC
  Administered 2013-07-18: 100 ug via INTRADERMAL
  Filled 2013-07-18: qty 0.01

## 2013-07-18 MED ORDER — FOSAPREPITANT DIMEGLUMINE INJECTION 150 MG
150.0000 mg | Freq: Once | INTRAVENOUS | Status: AC
  Administered 2013-07-18: 150 mg via INTRAVENOUS
  Filled 2013-07-18: qty 5

## 2013-07-18 MED ORDER — ONDANSETRON 16 MG/50ML IVPB (CHCC)
INTRAVENOUS | Status: AC
  Filled 2013-07-18: qty 16

## 2013-07-18 MED ORDER — SODIUM CHLORIDE 0.9 % IV SOLN
Freq: Once | INTRAVENOUS | Status: AC
Start: 2013-07-18 — End: 2013-07-18
  Administered 2013-07-18: 12:00:00 via INTRAVENOUS

## 2013-07-18 MED ORDER — ONDANSETRON 16 MG/50ML IVPB (CHCC)
16.0000 mg | Freq: Once | INTRAVENOUS | Status: AC
Start: 2013-07-18 — End: 2013-07-18
  Administered 2013-07-18: 16 mg via INTRAVENOUS

## 2013-07-18 MED ORDER — SODIUM CHLORIDE 0.9 % IJ SOLN
10.0000 mL | INTRAMUSCULAR | Status: DC | PRN
  Administered 2013-07-18: 10 mL
  Filled 2013-07-18: qty 10

## 2013-07-19 ENCOUNTER — Telehealth: Payer: Self-pay | Admitting: *Deleted

## 2013-07-19 ENCOUNTER — Ambulatory Visit: Payer: BC Managed Care – PPO

## 2013-07-19 NOTE — Telephone Encounter (Signed)
Message copied by Cherylynn Ridges on Thu Jul 19, 2013 12:44 PM ------      Message from: DODD, [facility]      Created: Wed Jul 18, 2013  4:38 PM      Regarding: new drug follow up       Patient previously was getting taxol/carbo.            New regimen of taxetere/carbo given 07/18/2013-            Note pt will get neulasta on Friday due to weather - and not having transportation ------

## 2013-07-19 NOTE — Telephone Encounter (Signed)
Called Kirsten Schroeder for chemotherapy F/U.  Patient is doing well.  Denies n/v.  Denies any new side effects or symptoms.  Bowel and bladder is functioning well.  Eating and drinking well and I instructed to drink 64 oz minimum daily or at least the day before, of and after treatment.  Asked what to do if she can't get out her driveway tomorrow.  Instructed to call before 4:30 pm to reschedule for Saturday injection appointment time.  Denies further questions at this time and encouraged to call if needed.  Reviewed how to call after hours in the case of an emergency.

## 2013-07-20 ENCOUNTER — Other Ambulatory Visit: Payer: Self-pay | Admitting: Physician Assistant

## 2013-07-20 ENCOUNTER — Ambulatory Visit: Payer: BC Managed Care – PPO

## 2013-07-20 ENCOUNTER — Telehealth: Payer: Self-pay | Admitting: *Deleted

## 2013-07-20 ENCOUNTER — Telehealth: Payer: Self-pay

## 2013-07-20 DIAGNOSIS — N390 Urinary tract infection, site not specified: Secondary | ICD-10-CM

## 2013-07-20 LAB — URINE CULTURE

## 2013-07-20 MED ORDER — LEVOFLOXACIN 500 MG PO TABS: 500.0000 mg | ORAL_TABLET | Freq: Every day | ORAL | Status: DC

## 2013-07-20 NOTE — Telephone Encounter (Signed)
Pt called stating she can not get off her street and to cancel her inj for today and to rs for tomorrow. gv appt for 07/21/13@ 11am. Pt is aware...td

## 2013-07-20 NOTE — Telephone Encounter (Signed)
Left message for Kirsten Schroeder to pick up Levaquin ATB as her UTI has improved but not completely cleared.  Prescription was sent to her pharmacy.  Levaquin 500 mg daily x 5 to begin today per [employer].

## 2013-07-21 ENCOUNTER — Ambulatory Visit (HOSPITAL_BASED_OUTPATIENT_CLINIC_OR_DEPARTMENT_OTHER): Payer: BC Managed Care – PPO

## 2013-07-21 VITALS — BP 123/74 | HR 110 | Temp 98.2°F | Resp 20

## 2013-07-21 DIAGNOSIS — C772 Secondary and unspecified malignant neoplasm of intra-abdominal lymph nodes: Secondary | ICD-10-CM

## 2013-07-21 DIAGNOSIS — C801 Malignant (primary) neoplasm, unspecified: Secondary | ICD-10-CM

## 2013-07-21 DIAGNOSIS — Z5189 Encounter for other specified aftercare: Secondary | ICD-10-CM

## 2013-07-21 MED ORDER — PEGFILGRASTIM INJECTION 6 MG/0.6ML
6.0000 mg | Freq: Once | SUBCUTANEOUS | Status: AC
  Administered 2013-07-21: 6 mg via SUBCUTANEOUS

## 2013-07-21 NOTE — Patient Instructions (Signed)

## 2013-07-25 ENCOUNTER — Other Ambulatory Visit (HOSPITAL_BASED_OUTPATIENT_CLINIC_OR_DEPARTMENT_OTHER): Payer: BC Managed Care – PPO

## 2013-07-25 ENCOUNTER — Ambulatory Visit: Payer: BC Managed Care – PPO | Admitting: Physician Assistant

## 2013-07-25 ENCOUNTER — Ambulatory Visit (HOSPITAL_BASED_OUTPATIENT_CLINIC_OR_DEPARTMENT_OTHER): Payer: BC Managed Care – PPO | Admitting: Physician Assistant

## 2013-07-25 ENCOUNTER — Encounter: Payer: Self-pay | Admitting: Physician Assistant

## 2013-07-25 ENCOUNTER — Other Ambulatory Visit: Payer: BC Managed Care – PPO

## 2013-07-25 VITALS — BP 120/65 | HR 104 | Temp 98.8°F | Resp 19 | Ht 61.0 in | Wt 144.3 lb

## 2013-07-25 DIAGNOSIS — T451X5A Adverse effect of antineoplastic and immunosuppressive drugs, initial encounter: Secondary | ICD-10-CM

## 2013-07-25 DIAGNOSIS — D701 Agranulocytosis secondary to cancer chemotherapy: Secondary | ICD-10-CM

## 2013-07-25 DIAGNOSIS — D649 Anemia, unspecified: Secondary | ICD-10-CM

## 2013-07-25 DIAGNOSIS — C569 Malignant neoplasm of unspecified ovary: Secondary | ICD-10-CM

## 2013-07-25 DIAGNOSIS — D702 Other drug-induced agranulocytosis: Secondary | ICD-10-CM

## 2013-07-25 DIAGNOSIS — D696 Thrombocytopenia, unspecified: Secondary | ICD-10-CM

## 2013-07-25 DIAGNOSIS — G609 Hereditary and idiopathic neuropathy, unspecified: Secondary | ICD-10-CM

## 2013-07-25 LAB — CBC WITH DIFFERENTIAL/PLATELET
BASO%: 0 % (ref 0.0–2.0)
Basophils Absolute: 0 10*3/uL (ref 0.0–0.1)
EOS%: 2.8 % (ref 0.0–7.0)
Eosinophils Absolute: 0 10*3/uL (ref 0.0–0.5)
HCT: 30 % — ABNORMAL LOW (ref 34.8–46.6)
HGB: 10.2 g/dL — ABNORMAL LOW (ref 11.6–15.9)
LYMPH%: 73.2 % — AB (ref 14.0–49.7)
MCH: 33.3 pg (ref 25.1–34.0)
MCHC: 34 g/dL (ref 31.5–36.0)
MCV: 98 fL (ref 79.5–101.0)
MONO#: 0.2 10*3/uL (ref 0.1–0.9)
MONO%: 21.1 % — ABNORMAL HIGH (ref 0.0–14.0)
NEUT#: 0 10*3/uL — CL (ref 1.5–6.5)
NEUT%: 2.9 % — ABNORMAL LOW (ref 38.4–76.8)
PLATELETS: 82 10*3/uL — AB (ref 145–400)
RBC: 3.06 10*6/uL — ABNORMAL LOW (ref 3.70–5.45)
RDW: 14.8 % — ABNORMAL HIGH (ref 11.2–14.5)
WBC: 0.7 10*3/uL — CL (ref 3.9–10.3)
lymph#: 0.5 10*3/uL — ABNORMAL LOW (ref 0.9–3.3)

## 2013-07-25 MED ORDER — CIPROFLOXACIN HCL 500 MG PO TABS: 500.0000 mg | ORAL_TABLET | Freq: Two times a day (BID) | ORAL | Status: DC

## 2013-07-25 NOTE — Progress Notes (Signed)
OFFICE PROGRESS NOTE   07/25/2013   Physicians:ClarkePearson, Quillian Quince; Evalee Mutton, Marlene Bast, Floy Sabina, Annie Main   INTERVAL HISTORY:  Patient is seen, alone for visit, in follow up of ongoing neoadjuvant chemotherapy for gyn malignancy. 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 Counts are improving from taxol carboplatin given 06-27-13, and E coli UTI symptoms have resolved on cipro, which will completed 07-10-13. She reports decreased appetite. She denies any fever, chills, nausea, vomiting, diarrhea or constipation. She denied any significant weight loss or night sweats.   She has PAC.   ONCOLOGIC HISTORY Patient presented with RLE swelling negative for DVT July 2014, and right hydronephrosis. She has hx ulcerative colitis with total colectomy and ileostomy 1990. 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 After consultations with multiple specialists, she began neoadjuvant dose dense taxol carboplatin 01-31-13. That treatment was complicated by cytopenias requiring PRBCs, gCSF and dose reductions, but scans after 3 cycles showed improvement in pelvic mass and adenopathy. She had 3 additional cycles, with ongoing problems with cytopenias but progressive decrease in CA 125 from 5626 in 12-2012 to 596 by Jan 2015. CT 06-14-13 had ~ stable pelvic mass and adenopathy. Dr Josephina Shih recommended another 3 cycles chemotherapy, regimen changed to q 3 week taxol carboplatin on 06-27-13, which unfortunately caused worsening of peripheral neuropathy and severe taxol aches. CA 125 was 495 after that treatment.Taxotere will be substituted for taxol beginning 07-18-13 due to neuropathy.    Review of systems as above, also: No fever, no dysuria, no hematuria or other bleeding. Ileostomy output at baseline, empties bag 4-5x daily. No significant nausea now. Some improved feeling in  feet, but still listens for her own footsteps to assist ambulating. Remainder of 10 point Review of Systems negative.  Objective:  Vital signs in last 24 hours:  BP 120/65  Pulse 104  Temp(Src) 98.8 F (37.1 C) (Oral)  Resp 19  Ht 5\' 1"  (1.549 m)  Wt 144 lb 4.8 oz (65.454 kg)  BMI 27.28 kg/m2 Weight stable. Alert, oriented, appropriate, very pleasant as always. Ambulatory without assistance.  Alopecia  HEENT:PERRL, sclerae not icteric. Oral mucosa moist without lesions, posterior pharynx clear.  Neck supple. No JVD.  Lymphatics:no cervical,suraclavicular or inguinal adenopathy Resp: clear to auscultation bilaterally and normal percussion bilaterally Cardio: regular rate and rhythm. No gallop. GI: soft, nontender, not distended, no mass or organomegaly. Normally active bowel sounds. Brown liquid in ileostomy bag, parastomal hernia. Musculoskeletal/ Extremities: 2+ swelling RLE as previously, no cords or tenderness Neuro: peripheral neuropathy feet bilaterally. Skin without rash, ecchymosis, petechiae Portacath-without erythema or tenderness  Lab Results:  Results for orders placed in visit on 07/25/13  CBC WITH DIFFERENTIAL      Result Value Ref Range   WBC 0.7 (*) 3.9 - 10.3 10e3/uL   NEUT# 0.0 (*) 1.5 - 6.5 10e3/uL   HGB 10.2 (*) 11.6 - 15.9 g/dL   HCT 30.0 (*) 34.8 - 46.6 %   Platelets 82 (*) 145 - 400 10e3/uL   MCV 98.0  79.5 - 101.0 fL   MCH 33.3  25.1 - 34.0 pg   MCHC 34.0  31.5 - 36.0 g/dL   RBC 3.06 (*) 3.70 - 5.45 10e6/uL   RDW 14.8 (*) 11.2 - 14.5 %   lymph# 0.5 (*) 0.9 - 3.3 10e3/uL   MONO# 0.2  0.1 - 0.9 10e3/uL   Eosinophils Absolute 0.0  0.0 - 0.5 10e3/uL  Basophils Absolute 0.0  0.0 - 0.1 10e3/uL   NEUT% 2.9 (*) 38.4 - 76.8 %   LYMPH% 73.2 (*) 14.0 - 49.7 %   MONO% 21.1 (*) 0.0 - 14.0 %   EOS% 2.8  0.0 - 7.0 %   BASO% 0.0  0.0 - 2.0 %    CA 125 available after visit 495  Will repeat urine culture 07-18-13.  Studies/Results:  No results  found.  Medications: I have reviewed the patient's current medications.  DISCUSSION: Overall Ms. ALT is tolerating her chemotherapy relatively well. However today she is significantly neutropenic and having received chemotherapy with carboplatin/Taxotere on 07/18/2013, with Neulasta given on 07/21/2013 due to the increment with her. Neutropenic cautions were discussed with the patient and her daughter. We'll place her five-day course of Cipro at 500 mg by mouth twice daily for empiric coverage. This was sent her pharmacy of record via E. scribed. She will followup with Dr. Marko Plume as previously scheduled on 08/06/2013 prior to the next scheduled chemotherapy on 08/08/2013. Will treat with taxotere and carboplatin 08-08-13 as long as ANC >=1.5 and plt >=100k, with usual decadron and neulasta.  We plan repeat scans and to see Dr Josephina Shih after 3-18 treatment. Patient has had all questions answered and is in agreement with plan.  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 Plan 2 cycles taxotere carboplatin then reevaluation as above. 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 3.E coli UTI 07-04-13, sensitive to Cipro, clinically much improved. Recheck culture 07-18-13.  Right JJ stent to remain in for now (very difficult placement initially) this infection still persisted with 40,000 colonies of Escherichia coli on the urine culture result of 07/18/2013. She is now on an additional 5 days of Cipro at 500 mg by mouth twice daily before the neutropenia however they should also help with residual  Escherichia coli UTI. 4.post total colectomy for ulcerative colitis 1990, with ileostomy 5.multiple blood transfusions with ulcerative colitis previously, and PRBCs total 4 units  during this chemo, from 10-15 thru 05-09-13. Last iron studies 02-19-13 had serum iron 90, %sat 40  6. PAC in  7.flu vaccine done  8.rash to PCN  9. Leukopenia, anemia, thrombocytopenia related to chemotherapy.  10. Extremely sensitive to oral contrast with CTs causing diarrhea, needs water based oral contrast regimen in radiology. Hypokalemia: related to ileostomy output, generally manages with diet. 11.RLE swelling: no DVT by prior evaluations    Carlton Adam, PA-C   07/25/2013, 5:03 PM

## 2013-07-30 NOTE — Patient Instructions (Signed)
Complete your antibiotics as prescribed and follow neutropenic precautions as discussed Followup with Dr. Marko Plume on 08/06/2013 as scheduled

## 2013-08-01 ENCOUNTER — Telehealth: Payer: Self-pay

## 2013-08-01 ENCOUNTER — Other Ambulatory Visit: Payer: Self-pay

## 2013-08-01 DIAGNOSIS — C772 Secondary and unspecified malignant neoplasm of intra-abdominal lymph nodes: Secondary | ICD-10-CM

## 2013-08-01 MED ORDER — FERROCITE PLUS 106-1 MG PO TABS: 1.0000 | ORAL_TABLET | Freq: Every day | ORAL | Status: DC

## 2013-08-01 NOTE — Telephone Encounter (Signed)
Ms. Kirsten Schroeder is doing better.  She is still tired but today was a good day.  She is afebrile.  She decreased the Cipro from 500 mg bid to 500 mg daily as she developed diarrhea.  She has one more dose to take of Cipro tomorrow.  Diarrhea resolving.  Kirsten Schroeder has a ileostomy. Ms. Hulon appreciated the phone call.

## 2013-08-01 NOTE — Telephone Encounter (Signed)
Message copied by Baruch Merl on Wed Aug 01, 2013  4:22 PM ------      Message from: Evlyn Clines P      Created: Wed Aug 01, 2013  3:32 PM       Labs seen and need follow up: she saw Adrena with these counts on 3-4; had already had neulasta and was put on Cipro.  She is to see me on 3-16. Please check on her by phone to be sure ok now. ------

## 2013-08-05 ENCOUNTER — Other Ambulatory Visit: Payer: Self-pay | Admitting: Oncology

## 2013-08-06 ENCOUNTER — Telehealth: Payer: Self-pay | Admitting: Oncology

## 2013-08-06 ENCOUNTER — Encounter: Payer: Self-pay | Admitting: Oncology

## 2013-08-06 ENCOUNTER — Other Ambulatory Visit (HOSPITAL_BASED_OUTPATIENT_CLINIC_OR_DEPARTMENT_OTHER): Payer: BC Managed Care – PPO

## 2013-08-06 ENCOUNTER — Ambulatory Visit (HOSPITAL_BASED_OUTPATIENT_CLINIC_OR_DEPARTMENT_OTHER): Payer: BC Managed Care – PPO | Admitting: Oncology

## 2013-08-06 VITALS — BP 121/77 | HR 96 | Temp 98.4°F | Ht 61.0 in | Wt 146.3 lb

## 2013-08-06 DIAGNOSIS — C801 Malignant (primary) neoplasm, unspecified: Secondary | ICD-10-CM

## 2013-08-06 DIAGNOSIS — D6959 Other secondary thrombocytopenia: Secondary | ICD-10-CM

## 2013-08-06 DIAGNOSIS — D6481 Anemia due to antineoplastic chemotherapy: Secondary | ICD-10-CM

## 2013-08-06 DIAGNOSIS — C569 Malignant neoplasm of unspecified ovary: Secondary | ICD-10-CM

## 2013-08-06 DIAGNOSIS — C772 Secondary and unspecified malignant neoplasm of intra-abdominal lymph nodes: Secondary | ICD-10-CM

## 2013-08-06 DIAGNOSIS — T451X5A Adverse effect of antineoplastic and immunosuppressive drugs, initial encounter: Secondary | ICD-10-CM

## 2013-08-06 LAB — COMPREHENSIVE METABOLIC PANEL (CC13)
ALK PHOS: 72 U/L (ref 40–150)
ALT: 7 U/L (ref 0–55)
AST: 16 U/L (ref 5–34)
Albumin: 3.4 g/dL — ABNORMAL LOW (ref 3.5–5.0)
Anion Gap: 10 mEq/L (ref 3–11)
BUN: 6.1 mg/dL — AB (ref 7.0–26.0)
CALCIUM: 9.5 mg/dL (ref 8.4–10.4)
CHLORIDE: 104 meq/L (ref 98–109)
CO2: 28 mEq/L (ref 22–29)
Creatinine: 0.6 mg/dL (ref 0.6–1.1)
Glucose: 108 mg/dl (ref 70–140)
Potassium: 3.5 mEq/L (ref 3.5–5.1)
Sodium: 143 mEq/L (ref 136–145)
Total Bilirubin: 0.24 mg/dL (ref 0.20–1.20)
Total Protein: 5.8 g/dL — ABNORMAL LOW (ref 6.4–8.3)

## 2013-08-06 LAB — CBC WITH DIFFERENTIAL/PLATELET
BASO%: 0.5 % (ref 0.0–2.0)
Basophils Absolute: 0 10*3/uL (ref 0.0–0.1)
EOS%: 0.2 % (ref 0.0–7.0)
Eosinophils Absolute: 0 10*3/uL (ref 0.0–0.5)
HEMATOCRIT: 30.7 % — AB (ref 34.8–46.6)
HEMOGLOBIN: 10.4 g/dL — AB (ref 11.6–15.9)
LYMPH%: 14 % (ref 14.0–49.7)
MCH: 34.8 pg — AB (ref 25.1–34.0)
MCHC: 33.9 g/dL (ref 31.5–36.0)
MCV: 102.9 fL — AB (ref 79.5–101.0)
MONO#: 0.5 10*3/uL (ref 0.1–0.9)
MONO%: 7.4 % (ref 0.0–14.0)
NEUT#: 5.2 10*3/uL (ref 1.5–6.5)
NEUT%: 77.9 % — AB (ref 38.4–76.8)
Platelets: 165 10*3/uL (ref 145–400)
RBC: 2.98 10*6/uL — ABNORMAL LOW (ref 3.70–5.45)
RDW: 17.2 % — ABNORMAL HIGH (ref 11.2–14.5)
WBC: 6.7 10*3/uL (ref 3.9–10.3)
lymph#: 0.9 10*3/uL (ref 0.9–3.3)

## 2013-08-06 NOTE — Progress Notes (Signed)
OFFICE PROGRESS NOTE   08/06/2013   Physicians:ClarkePearson, Quillian Quince; Evalee Mutton, Marlene Bast, Lorriane Shire; Dahlstedt, Annie Main   INTERVAL HISTORY:  Patient is seen, together with daughter, in continuing attention to neoadjuvant chemotherapy for serous gyn carcinoma, regimen changed to carboplatin + taxotere with cycle 7 on 07-18-13 due to significant worsening of peripheral neuropathy in feet from previous taxol. She was neutropenic (ANC 0.0) by day 8 cycle 7, despite neulasta (given day 4 due to weather), but fortunately did not develop fever or infection. She was placed on cipro prophylactically when neutropenic, which seemed to cause diarrhea by end of that course. The diarrhea has resolved now. She was very tired when counts were low, but has felt better for past several days. Peripheral neuropathy in feet has improved, now numbness just in toes; hands are ok. Blood draws were difficult peripheral today; PAC has functioned well including drawing blood.  Plan is for one more cycle of carbo + taxotere scheduled 08-08-13, then repeat scans prior to seeing Dr Josephina Shih again on 08-24-13.    ONCOLOGIC HISTORY Patient presented with RLE swelling negative for DVT July 2014, and right hydronephrosis.  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 She had 3 additional cycles, with progressive decrease in CA 125 from 5626 in 12-2012 to 596 by Jan 2015. CT 06-14-13 had ~ stable pelvic mass and adenopathy. Dr Josephina Shih recommended another 3 cycles chemotherapy, regimen changed to q 3 week taxol carboplatin on 06-27-13, which unfortunately caused worsening of peripheral neuropathy and severe taxol aches. CA 125 was 495 after that treatment.Taxotere was substituted for taxol beginning cycle 8 due to neuropathy in feet.     Review of systems as above, also: Energy better, able to do laundry today. Appetite and taste better now, no nausea. Ileostomy function back to baseline. Swelling RLE back to baseline "worse right after chemo". No SOB. No complaints of excessive lacrimation or nail changes. No bleeding. No abdominal or pelvic pain. Remainder of 10 point Review of Systems negative.  Objective:  Vital signs in last 24 hours:  BP 121/77  Pulse 96  Temp(Src) 98.4 F (36.9 C) (Oral)  Ht 5\' 1"  (1.549 m)  Wt 146 lb 4.8 oz (66.361 kg)  BMI 27.66 kg/m2  SpO2 98% Weight is up 2 lbs. Respirations not labored RA. Looks comfortable and in good spirits today. Alert, oriented and appropriate. Ambulatory without difficulty.  Alopecia  HEENT:PERRL, sclerae not icteric. Oral mucosa moist without lesions, posterior pharynx clear.  Neck supple. No JVD.  Lymphatics:no cervical,suraclavicular or inguinal adenopathy Resp: clear to auscultation bilaterally and normal percussion bilaterally Cardio: regular rate and rhythm. No gallop. GI: soft, nontender, not distended, no mass or organomegaly. Normally active bowel sounds. Ostomy. Musculoskeletal/ Extremities: RLE tightly swollen to knee with 1-2+ swelling halfway up thigh, no cords or tenderness. 1+ left pedal edema Neuro: sensory peripheral neuropathy distal feet bilaterally. Otherwise nonfocal. Psych normal mood and affect Skin without rash, ecchymosis, petechiae Portacath-without erythema or tenderness  Lab Results:  Results for orders placed in visit on 08/06/13  CBC WITH DIFFERENTIAL      Result Value Ref Range   WBC 6.7  3.9 - 10.3 10e3/uL   NEUT# 5.2  1.5 - 6.5 10e3/uL   HGB 10.4 (*) 11.6 - 15.9 g/dL   HCT 30.7 (*) 34.8 - 46.6 %   Platelets 165  145 -  400 10e3/uL   MCV 102.9 (*) 79.5 - 101.0 fL   MCH 34.8 (*) 25.1 - 34.0 pg   MCHC 33.9  31.5 - 36.0 g/dL   RBC 2.98 (*) 3.70 - 5.45 10e6/uL   RDW 17.2 (*) 11.2 - 14.5 %   lymph# 0.9  0.9 - 3.3 10e3/uL    MONO# 0.5  0.1 - 0.9 10e3/uL   Eosinophils Absolute 0.0  0.0 - 0.5 10e3/uL   Basophils Absolute 0.0  0.0 - 0.1 10e3/uL   NEUT% 77.9 (*) 38.4 - 76.8 %   LYMPH% 14.0  14.0 - 49.7 %   MONO% 7.4  0.0 - 14.0 %   EOS% 0.2  0.0 - 7.0 %   BASO% 0.5  0.0 - 2.0 %  COMPREHENSIVE METABOLIC PANEL (JW11)      Result Value Ref Range   Sodium 143  136 - 145 mEq/L   Potassium 3.5  3.5 - 5.1 mEq/L   Chloride 104  98 - 109 mEq/L   CO2 28  22 - 29 mEq/L   Glucose 108  70 - 140 mg/dl   BUN 6.1 (*) 7.0 - 26.0 mg/dL   Creatinine 0.6  0.6 - 1.1 mg/dL   Total Bilirubin 0.24  0.20 - 1.20 mg/dL   Alkaline Phosphatase 72  40 - 150 U/L   AST 16  5 - 34 U/L   ALT 7  0 - 55 U/L   Total Protein 5.8 (*) 6.4 - 8.3 g/dL   Albumin 3.4 (*) 3.5 - 5.0 g/dL   Calcium 9.5  8.4 - 10.4 mg/dL   Anion Gap 10  3 - 11 mEq/L    CA 125 sent and pending now.  Studies/Results:  No results found. CT AP to be repeated shortly prior to gyn oncology follow up visit  Medications: I have reviewed the patient's current medications. She understands that she is to take decadron 8 mg bid x 3 days beginning 08-07-13 for upcoming chemotherapy. Will decrease doses due to nadir ANC 0 despite neulasta, and will give neulasta on day 2 this cycle. Carbo skin testing prior to each carboplatin.  DISCUSSION: she tolerates water based po contrast at radiology for CT scans better than other oral contrast, due to ileostomy.  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 Plan one additional cycle taxotere carboplatin on 08-08-13, with neulasta; I will see her back about a week after treatment, then repeat CT AP and reevaluation by Dr Josephina Shih. Not clear if surgery will be a possibility. 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  3.right JJ stent 4.post total colectomy for ulcerative colitis 1990, with ileostomy  5.multiple blood transfusions with ulcerative colitis previously, and PRBCs total 4 units during this chemo, from 10-15 thru 05-09-13. Last iron studies 02-19-13 had serum iron 90, %sat 40  6. PAC in  7.flu vaccine done  8.rash to PCN  9. Leukopenia, anemia, thrombocytopenia related to chemotherapy. ANC 0 after first taxotere despite neulasta. 10. Extremely sensitive to oral contrast with CTs causing diarrhea, needs water based oral contrast regimen in radiology. Hypokalemia: related to ileostomy output, generally manages with diet.  11.RLE swelling: no DVT by prior evaluations 12.previous E coli UTI: no symptoms presently   Patient is in agreement with plan  above   [name],LENNIS P, MD   08/06/2013, 3:01 PM

## 2013-08-07 ENCOUNTER — Other Ambulatory Visit: Payer: Self-pay | Admitting: Oncology

## 2013-08-07 ENCOUNTER — Telehealth: Payer: Self-pay | Admitting: Oncology

## 2013-08-07 LAB — CA 125: CA 125: 732 U/mL — ABNORMAL HIGH (ref 0.0–30.2)

## 2013-08-07 NOTE — Telephone Encounter (Signed)
, °

## 2013-08-08 ENCOUNTER — Ambulatory Visit (HOSPITAL_BASED_OUTPATIENT_CLINIC_OR_DEPARTMENT_OTHER): Payer: BC Managed Care – PPO

## 2013-08-08 VITALS — BP 127/70 | HR 85 | Temp 98.1°F | Resp 18

## 2013-08-08 DIAGNOSIS — C772 Secondary and unspecified malignant neoplasm of intra-abdominal lymph nodes: Secondary | ICD-10-CM

## 2013-08-08 DIAGNOSIS — C801 Malignant (primary) neoplasm, unspecified: Secondary | ICD-10-CM

## 2013-08-08 DIAGNOSIS — Z5111 Encounter for antineoplastic chemotherapy: Secondary | ICD-10-CM

## 2013-08-08 DIAGNOSIS — C569 Malignant neoplasm of unspecified ovary: Secondary | ICD-10-CM

## 2013-08-08 MED ORDER — ONDANSETRON 16 MG/50ML IVPB (CHCC)
INTRAVENOUS | Status: AC
  Filled 2013-08-08: qty 16

## 2013-08-08 MED ORDER — SODIUM CHLORIDE 0.9 % IJ SOLN
10.0000 mL | INTRAMUSCULAR | Status: DC | PRN
  Administered 2013-08-08: 10 mL
  Filled 2013-08-08: qty 10

## 2013-08-08 MED ORDER — SODIUM CHLORIDE 0.9 % IV SOLN
45.0000 mg/m2 | Freq: Once | INTRAVENOUS | Status: AC
  Administered 2013-08-08: 80 mg via INTRAVENOUS
  Filled 2013-08-08: qty 8

## 2013-08-08 MED ORDER — DEXAMETHASONE SODIUM PHOSPHATE 20 MG/5ML IJ SOLN
INTRAMUSCULAR | Status: AC
  Filled 2013-08-08: qty 5

## 2013-08-08 MED ORDER — SODIUM CHLORIDE 0.9 % IV SOLN
423.2000 mg | Freq: Once | INTRAVENOUS | Status: AC
  Administered 2013-08-08: 420 mg via INTRAVENOUS
  Filled 2013-08-08: qty 42

## 2013-08-08 MED ORDER — ONDANSETRON 16 MG/50ML IVPB (CHCC)
16.0000 mg | Freq: Once | INTRAVENOUS | Status: AC
  Administered 2013-08-08: 16 mg via INTRAVENOUS

## 2013-08-08 MED ORDER — DEXAMETHASONE SODIUM PHOSPHATE 20 MG/5ML IJ SOLN
12.0000 mg | Freq: Once | INTRAMUSCULAR | Status: AC
  Administered 2013-08-08: 12 mg via INTRAVENOUS

## 2013-08-08 MED ORDER — HEPARIN SOD (PORK) LOCK FLUSH 100 UNIT/ML IV SOLN
500.0000 [IU] | Freq: Once | INTRAVENOUS | Status: AC | PRN
  Administered 2013-08-08: 500 [IU]
  Filled 2013-08-08: qty 5

## 2013-08-08 MED ORDER — CARBOPLATIN CHEMO INTRADERMAL TEST DOSE 100MCG/0.02ML
100.0000 ug | Freq: Once | INTRADERMAL | Status: AC
  Administered 2013-08-08: 100 ug via INTRADERMAL
  Filled 2013-08-08: qty 0.01

## 2013-08-08 MED ORDER — SODIUM CHLORIDE 0.9 % IV SOLN
Freq: Once | INTRAVENOUS | Status: AC
  Administered 2013-08-08: 12:00:00 via INTRAVENOUS

## 2013-08-08 MED ORDER — SODIUM CHLORIDE 0.9 % IV SOLN
150.0000 mg | Freq: Once | INTRAVENOUS | Status: AC
  Administered 2013-08-08: 150 mg via INTRAVENOUS
  Filled 2013-08-08: qty 5

## 2013-08-08 NOTE — Patient Instructions (Signed)
[facility] Discharge Instructions for Patients Receiving Chemotherapy  Today you received the following chemotherapy agents carboplatin, taxotere.  To help prevent nausea and vomiting after your treatment, we encourage you to take your nausea medication zofran, lorazepam.   If you develop nausea and vomiting that is not controlled by your nausea medication, call the clinic.   BELOW ARE SYMPTOMS THAT SHOULD BE REPORTED IMMEDIATELY:  *FEVER GREATER THAN 100.5 F  *CHILLS WITH OR WITHOUT FEVER  NAUSEA AND VOMITING THAT IS NOT CONTROLLED WITH YOUR NAUSEA MEDICATION  *UNUSUAL SHORTNESS OF BREATH  *UNUSUAL BRUISING OR BLEEDING  TENDERNESS IN MOUTH AND THROAT WITH OR WITHOUT PRESENCE OF ULCERS  *URINARY PROBLEMS  *BOWEL PROBLEMS  UNUSUAL RASH Items with * indicate a potential emergency and should be followed up as soon as possible.  Feel free to call the clinic you have any questions or concerns. The clinic phone number is [phone].

## 2013-08-08 NOTE — Progress Notes (Signed)
Carbo test dose given to left, mid-outer forearm at 1209.   5 minute check at 1215: left arm clean, dry without redness or raised bumps. 15 minute check at 1225: left arm wnl 30 minute check at 1240: Skin test negative. Site unremarkable.

## 2013-08-08 NOTE — Progress Notes (Signed)
Discharged at 1552 with daughter, ambulatory in no distress to home.

## 2013-08-09 ENCOUNTER — Ambulatory Visit (HOSPITAL_BASED_OUTPATIENT_CLINIC_OR_DEPARTMENT_OTHER): Payer: BC Managed Care – PPO

## 2013-08-09 VITALS — BP 122/71 | HR 85 | Temp 98.7°F

## 2013-08-09 DIAGNOSIS — C772 Secondary and unspecified malignant neoplasm of intra-abdominal lymph nodes: Secondary | ICD-10-CM

## 2013-08-09 DIAGNOSIS — C801 Malignant (primary) neoplasm, unspecified: Secondary | ICD-10-CM

## 2013-08-09 DIAGNOSIS — C569 Malignant neoplasm of unspecified ovary: Secondary | ICD-10-CM

## 2013-08-09 DIAGNOSIS — Z5189 Encounter for other specified aftercare: Secondary | ICD-10-CM

## 2013-08-09 MED ORDER — PEGFILGRASTIM INJECTION 6 MG/0.6ML
6.0000 mg | Freq: Once | SUBCUTANEOUS | Status: AC
  Administered 2013-08-09: 6 mg via SUBCUTANEOUS
  Filled 2013-08-09: qty 0.6

## 2013-08-14 ENCOUNTER — Other Ambulatory Visit: Payer: Self-pay | Admitting: Oncology

## 2013-08-15 ENCOUNTER — Ambulatory Visit (HOSPITAL_COMMUNITY): Payer: BC Managed Care – PPO

## 2013-08-17 ENCOUNTER — Ambulatory Visit (HOSPITAL_BASED_OUTPATIENT_CLINIC_OR_DEPARTMENT_OTHER): Payer: BC Managed Care – PPO | Admitting: Oncology

## 2013-08-17 ENCOUNTER — Ambulatory Visit (HOSPITAL_COMMUNITY): Payer: BC Managed Care – PPO

## 2013-08-17 ENCOUNTER — Encounter: Payer: Self-pay | Admitting: Oncology

## 2013-08-17 ENCOUNTER — Other Ambulatory Visit (HOSPITAL_BASED_OUTPATIENT_CLINIC_OR_DEPARTMENT_OTHER): Payer: BC Managed Care – PPO

## 2013-08-17 ENCOUNTER — Encounter: Payer: BC Managed Care – PPO | Admitting: Nutrition

## 2013-08-17 ENCOUNTER — Telehealth: Payer: Self-pay | Admitting: Oncology

## 2013-08-17 VITALS — BP 132/80 | HR 98 | Temp 98.4°F | Resp 18 | Ht 61.0 in | Wt 140.5 lb

## 2013-08-17 DIAGNOSIS — D6481 Anemia due to antineoplastic chemotherapy: Secondary | ICD-10-CM

## 2013-08-17 DIAGNOSIS — C801 Malignant (primary) neoplasm, unspecified: Secondary | ICD-10-CM

## 2013-08-17 DIAGNOSIS — D6959 Other secondary thrombocytopenia: Secondary | ICD-10-CM

## 2013-08-17 DIAGNOSIS — T451X5A Adverse effect of antineoplastic and immunosuppressive drugs, initial encounter: Secondary | ICD-10-CM

## 2013-08-17 DIAGNOSIS — C778 Secondary and unspecified malignant neoplasm of lymph nodes of multiple regions: Secondary | ICD-10-CM

## 2013-08-17 DIAGNOSIS — R634 Abnormal weight loss: Secondary | ICD-10-CM

## 2013-08-17 DIAGNOSIS — Z9189 Other specified personal risk factors, not elsewhere classified: Secondary | ICD-10-CM

## 2013-08-17 DIAGNOSIS — R21 Rash and other nonspecific skin eruption: Secondary | ICD-10-CM

## 2013-08-17 DIAGNOSIS — C569 Malignant neoplasm of unspecified ovary: Secondary | ICD-10-CM

## 2013-08-17 DIAGNOSIS — E876 Hypokalemia: Secondary | ICD-10-CM

## 2013-08-17 DIAGNOSIS — B3749 Other urogenital candidiasis: Secondary | ICD-10-CM

## 2013-08-17 DIAGNOSIS — M7989 Other specified soft tissue disorders: Secondary | ICD-10-CM

## 2013-08-17 DIAGNOSIS — D72819 Decreased white blood cell count, unspecified: Secondary | ICD-10-CM

## 2013-08-17 LAB — CBC WITH DIFFERENTIAL/PLATELET
BASO%: 0.8 % (ref 0.0–2.0)
Basophils Absolute: 0.1 10*3/uL (ref 0.0–0.1)
EOS%: 0.1 % (ref 0.0–7.0)
Eosinophils Absolute: 0 10*3/uL (ref 0.0–0.5)
HCT: 29.4 % — ABNORMAL LOW (ref 34.8–46.6)
HEMOGLOBIN: 10 g/dL — AB (ref 11.6–15.9)
LYMPH%: 9.3 % — ABNORMAL LOW (ref 14.0–49.7)
MCH: 35.1 pg — AB (ref 25.1–34.0)
MCHC: 34.1 g/dL (ref 31.5–36.0)
MCV: 102.7 fL — AB (ref 79.5–101.0)
MONO#: 1.3 10*3/uL — ABNORMAL HIGH (ref 0.1–0.9)
MONO%: 12.4 % (ref 0.0–14.0)
NEUT#: 8.2 10*3/uL — ABNORMAL HIGH (ref 1.5–6.5)
NEUT%: 77.4 % — ABNORMAL HIGH (ref 38.4–76.8)
PLATELETS: 114 10*3/uL — AB (ref 145–400)
RBC: 2.86 10*6/uL — ABNORMAL LOW (ref 3.70–5.45)
RDW: 16.7 % — AB (ref 11.2–14.5)
WBC: 10.7 10*3/uL — ABNORMAL HIGH (ref 3.9–10.3)
lymph#: 1 10*3/uL (ref 0.9–3.3)

## 2013-08-17 LAB — URINALYSIS, MICROSCOPIC - CHCC
BILIRUBIN (URINE): NEGATIVE
Glucose: NEGATIVE mg/dL
Ketones: NEGATIVE mg/dL
Nitrite: NEGATIVE
PROTEIN: 300 mg/dL
Specific Gravity, Urine: 1.03 (ref 1.003–1.035)
pH: 6 (ref 4.6–8.0)

## 2013-08-17 MED ORDER — FLUCONAZOLE 100 MG PO TABS: 100.0000 mg | ORAL_TABLET | Freq: Every day | ORAL | Status: DC

## 2013-08-17 MED ORDER — LORAZEPAM 0.5 MG PO TABS: 0.5000 mg | ORAL_TABLET | Freq: Four times a day (QID) | ORAL | Status: AC | PRN

## 2013-08-17 MED ORDER — CIPROFLOXACIN HCL 250 MG PO TABS: 250.0000 mg | ORAL_TABLET | Freq: Two times a day (BID) | ORAL | Status: DC

## 2013-08-17 NOTE — Telephone Encounter (Signed)
, °

## 2013-08-17 NOTE — Progress Notes (Signed)
OFFICE PROGRESS NOTE   08/17/2013   Physicians:ClarkePearson, Quillian Quince; Evalee Mutton, Marlene Bast, Floy Sabina, Annie Main   INTERVAL HISTORY:  Patient is seen, alone for visit, in continuing attention to neoadjuvant chemotherapy for serous gyn carcinoma. She has now had 7 cycles of carbo + taxol from 01-31-2013 thru 06-27-2013 (initially weekly regimen then one cycle of q 3 week dosing), then 2 cycles of carbo + taxotere 07-18-13 and 08-08-13 due to peripheral neuropathy. She had neulasta after each taxotere.  Patient had aches from neulasta, but no worsening of peripheral neuropathy since change to taxotere. Nausea has been controlled tho appetite poor especially with taste changes, has lost several more pounds. She is drinking gatorade well. Fatigue after last treatment is beginning to improve. She did not have severe diarrhea this time. She denies abdominal or pelvic pain or fullness now. She has had more dysuria past 24 hours, feels like start of another UTI.  She has PAC and right JJ stent.  ONCOLOGIC HISTORY Patient presented with RLE swelling negative for DVT July 2014, and right hydronephrosis. 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 She had 3 additional cycles, with progressive decrease in CA 125 from 5626 in 12-2012 to 596 by Jan 2015. CT 06-14-13 had ~ stable pelvic mass and adenopathy. Dr Josephina Shih recommended another 3 cycles chemotherapy, regimen changed to q 3 week taxol carboplatin on 06-27-13, which unfortunately caused worsening of peripheral neuropathy and severe taxol aches.Taxotere was substituted for taxol due to neuropathy in feet for cycles 8 and 9, completed 08-08-13. CA 125 improved to low of 494 in Feb 2015, then 732 just prior to cycle 9.  Review of systems as above, also: No fever  or symptoms of infection. No overt bleeding. Swelling RLE a little better. No increased SOB or cough. No new or different pain. Blood pressures at home ~ 009 systolic such that she has held lisinopril (which she had been on x years). Remainder of 10 point Review of Systems negative.  Objective:  Vital signs in last 24 hours:  BP 132/80  Pulse 98  Temp(Src) 98.4 F (36.9 C) (Oral)  Resp 18  Ht 5\' 1"  (1.549 m)  Wt 140 lb 8 oz (63.73 kg)  BMI 26.56 kg/m2  Weight is down 6 lbs from 08-06-13.  Alert, oriented and appropriate. Ambulatory without assistance.  Alopecia. Pale, not icteric. Respirations not labored RA. Very pleasant as always.  HEENT:PERRL, sclerae not icteric. Oral mucosa moist without lesions, posterior pharynx clear.  Neck supple. No JVD.  Lymphatics:no cervical,suraclavicular adenopathy Resp: clear to auscultation bilaterally and normal percussion bilaterally Cardio: regular rate and rhythm. No gallop. GI: soft, nontender, not distended, no appreciable mass or organomegaly. Normally active bowel sounds. Ileostomy bag intact, large.parastomal hernia stable. Musculoskeletal/ Extremities: Swelling RLE not as tight in calf and minimal today in thigh,without cords, tenderness. Trace swelling LLE Neuro: no peripheral neuropathy. Otherwise nonfocal Skin without rash, ecchymosis, petechiae. Portacath-without erythema or tenderness  Lab Results:  Results for orders placed in visit on 08/17/13  CBC WITH DIFFERENTIAL      Result Value Ref Range   WBC 10.7 (*) 3.9 - 10.3 10e3/uL   NEUT# 8.2 (*) 1.5 - 6.5 10e3/uL   HGB 10.0 (*) 11.6 - 15.9 g/dL   HCT 29.4 (*) 34.8 - 46.6 %   Platelets 114 (*) 145 - 400 10e3/uL  MCV 102.7 (*) 79.5 - 101.0 fL   MCH 35.1 (*) 25.1 - 34.0 pg   MCHC 34.1  31.5 - 36.0 g/dL   RBC 2.86 (*) 3.70 - 5.45 10e6/uL   RDW 16.7 (*) 11.2 - 14.5 %   lymph# 1.0  0.9 - 3.3 10e3/uL   MONO# 1.3 (*) 0.1 - 0.9 10e3/uL   Eosinophils Absolute 0.0  0.0 - 0.5 10e3/uL    Basophils Absolute 0.1  0.0 - 0.1 10e3/uL   NEUT% 77.4 (*) 38.4 - 76.8 %   LYMPH% 9.3 (*) 14.0 - 49.7 %   MONO% 12.4  0.0 - 14.0 %   EOS% 0.1  0.0 - 7.0 %   BASO% 0.8  0.0 - 2.0 %  URINALYSIS, MICROSCOPIC - [facility]      Result Value Ref Range   Glucose Negative  Negative mg/dL   Bilirubin (Urine) Negative  Negative   Ketones Negative  Negative mg/dL   Specific Gravity, Urine 1.030  1.003 - 1.035   Blood Large  Negative   pH 6.0  4.6 - 8.0   Protein 300  Negative- <30 mg/dL   Urobilinogen, UR Color Interference  0.2 - 1 mg/dL   Nitrite Negative  Negative   Leukocyte Esterase Moderate  Negative   RBC / HPF Field Obscured by WBCs  0 - 2   WBC, UA TNTC  0 - 2   Bacteria, UA Many  Negative- Trace   Epithelial Cells Occasional  Negative- Few   Yeast, UA Many with hyphae  Negative   UA resulted after visit, culture pending. Empiric cipro and added diflucan after yeast noted on UA.  CA 125 a little higher at 732 on 08-06-13, having been at low of 494 in Feb 2015 (5626 in Aug 2014)  Studies/Results:  No results found. For CT AP 08-22-13 (contrast at radiology due to ileostomy)  Medications: I have reviewed the patient's current medications. Begin cipro 250 mg bid + diflucan. Ativan refilled.   DISCUSSION: will follow up urine culture pending. Will repeat CBC when she is here either for CT on 4-1 or gyn onc visit on 4-3.  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 Dr Josephina Shih to consider debulking surgery based on upcoming scan and his reevaluation. Next appointment to this office pending that reevaluation. I believe at least some break from chemo would be helpful now. 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  3.right JJ stent: upcoming CT information to help direct management by Dr Diona Fanti 4.post total colectomy for ulcerative colitis 1990, with ileostomy  5.multiple blood transfusions with ulcerative colitis previously, and PRBCs total 4 units during this chemo, from 10-15 thru 05-09-13. Last iron studies 02-19-13 had serum iron 90, %sat 40  6. PAC in  7.symptoms suggest UTI: culture pending as difficult to interpret just UA due to stent. Empiric cipro and diflucan due to budding yeast. 8.rash to PCN  9. Leukopenia, anemia, thrombocytopenia related to chemotherapy. ANC 0 after first taxotere despite neulasta. Recheck counts next week. 10. Extremely sensitive to oral contrast with CTs causing diarrhea, needs water based oral contrast regimen in radiology. Hypokalemia: related to ileostomy output, generally manages with diet.  11.RLE swelling: no DVT by prior evaluations  12.weight loss: dietician aware  and patient motivated to address.        Ryin Ambrosius P, MD   08/17/2013, 11:33 AM

## 2013-08-17 NOTE — Patient Instructions (Signed)
We will send prescription for antibiotic to cover until the urine culture results  We will check blood counts when you are here either for the CT on 4-1 or for gyn onc visit on 4-3  Dr Marko Plume will set up next visit depending on what we need after CT/ after Dr Josephina Shih sees you  They should use portacath for CT. It needs to be flushed every 6-8 weeks when not otherwise used.

## 2013-08-19 LAB — URINE CULTURE

## 2013-08-20 ENCOUNTER — Telehealth: Payer: Self-pay | Admitting: *Deleted

## 2013-08-20 ENCOUNTER — Telehealth: Payer: Self-pay | Admitting: Nutrition

## 2013-08-20 ENCOUNTER — Encounter: Payer: BC Managed Care – PPO | Admitting: Nutrition

## 2013-08-20 NOTE — Telephone Encounter (Signed)
I was asked by nursing to contact patient by telephone this week.  Patient reports she is feeling much better since she is on medication for her infection.  She reports 6 pound weight loss likely reflective of decreased edema.  Patient is consuming 3 meals with 3 small snacks daily including high sources of protein.  Patient's son is a Biomedical scientist for nursing home and has a good understanding of protein containing foods.  He is helping his mother with menus.  Encouraged patient to continue high-protein diet with adequate calories to minimize further weight loss.  Recommended patient add in juice-based supplements such as resource breeze is she is unable to maintain weight.  Patient is agreeable to plan and reports she will contact me with further needs.

## 2013-08-20 NOTE — Telephone Encounter (Signed)
Pt notified of results below. States she started Cipro on Saturday and is feeling better

## 2013-08-20 NOTE — Telephone Encounter (Signed)
Message copied by Sharlynn Oliphant A on Mon Aug 20, 2013  1:28 PM ------      Message from: Evlyn Clines P      Created: Mon Aug 20, 2013  1:05 PM       Labs seen and need follow up: please let her know urine culture does show infection, sensitive to quinolones (=cipro family, cipro begun 3-30).  How is she now? ------

## 2013-08-22 ENCOUNTER — Encounter (HOSPITAL_COMMUNITY): Payer: Self-pay

## 2013-08-22 ENCOUNTER — Ambulatory Visit (HOSPITAL_COMMUNITY)
Admission: RE | Admit: 2013-08-22 | Discharge: 2013-08-22 | Disposition: A | Discharge status: Home / Self Care | Payer: BC Managed Care – PPO | Source: Ambulatory Visit | Attending: Oncology | Admitting: Oncology

## 2013-08-22 DIAGNOSIS — K439 Ventral hernia without obstruction or gangrene: Secondary | ICD-10-CM | POA: Insufficient documentation

## 2013-08-22 DIAGNOSIS — C7951 Secondary malignant neoplasm of bone: Secondary | ICD-10-CM | POA: Insufficient documentation

## 2013-08-22 DIAGNOSIS — C569 Malignant neoplasm of unspecified ovary: Secondary | ICD-10-CM | POA: Insufficient documentation

## 2013-08-22 DIAGNOSIS — C7952 Secondary malignant neoplasm of bone marrow: Secondary | ICD-10-CM

## 2013-08-22 DIAGNOSIS — M949 Disorder of cartilage, unspecified: Secondary | ICD-10-CM

## 2013-08-22 DIAGNOSIS — N1330 Unspecified hydronephrosis: Secondary | ICD-10-CM | POA: Insufficient documentation

## 2013-08-22 DIAGNOSIS — Z9221 Personal history of antineoplastic chemotherapy: Secondary | ICD-10-CM | POA: Insufficient documentation

## 2013-08-22 DIAGNOSIS — M899 Disorder of bone, unspecified: Secondary | ICD-10-CM | POA: Insufficient documentation

## 2013-08-22 MED ORDER — IOHEXOL 300 MG/ML  SOLN
50.0000 mL | Freq: Once | INTRAMUSCULAR | Status: AC | PRN
  Administered 2013-08-22: 50 mL via ORAL

## 2013-08-22 MED ORDER — IOHEXOL 300 MG/ML  SOLN
100.0000 mL | Freq: Once | INTRAMUSCULAR | Status: AC | PRN
  Administered 2013-08-22: 100 mL via INTRAVENOUS

## 2013-08-24 ENCOUNTER — Other Ambulatory Visit (HOSPITAL_BASED_OUTPATIENT_CLINIC_OR_DEPARTMENT_OTHER): Payer: BC Managed Care – PPO

## 2013-08-24 ENCOUNTER — Ambulatory Visit: Payer: BC Managed Care – PPO | Attending: Gynecology | Admitting: Gynecology

## 2013-08-24 ENCOUNTER — Encounter: Payer: Self-pay | Admitting: Gynecology

## 2013-08-24 VITALS — BP 133/67 | HR 84 | Temp 98.4°F | Resp 16 | Ht 61.0 in | Wt 140.2 lb

## 2013-08-24 DIAGNOSIS — D61818 Other pancytopenia: Secondary | ICD-10-CM

## 2013-08-24 DIAGNOSIS — Z9049 Acquired absence of other specified parts of digestive tract: Secondary | ICD-10-CM | POA: Insufficient documentation

## 2013-08-24 DIAGNOSIS — C801 Malignant (primary) neoplasm, unspecified: Secondary | ICD-10-CM

## 2013-08-24 DIAGNOSIS — Z932 Ileostomy status: Secondary | ICD-10-CM | POA: Insufficient documentation

## 2013-08-24 DIAGNOSIS — T451X5A Adverse effect of antineoplastic and immunosuppressive drugs, initial encounter: Secondary | ICD-10-CM

## 2013-08-24 DIAGNOSIS — R971 Elevated cancer antigen 125 [CA 125]: Secondary | ICD-10-CM | POA: Insufficient documentation

## 2013-08-24 DIAGNOSIS — Z9189 Other specified personal risk factors, not elsewhere classified: Secondary | ICD-10-CM

## 2013-08-24 DIAGNOSIS — Z880 Allergy status to penicillin: Secondary | ICD-10-CM | POA: Insufficient documentation

## 2013-08-24 DIAGNOSIS — C778 Secondary and unspecified malignant neoplasm of lymph nodes of multiple regions: Secondary | ICD-10-CM

## 2013-08-24 DIAGNOSIS — N390 Urinary tract infection, site not specified: Secondary | ICD-10-CM | POA: Insufficient documentation

## 2013-08-24 DIAGNOSIS — C569 Malignant neoplasm of unspecified ovary: Secondary | ICD-10-CM | POA: Insufficient documentation

## 2013-08-24 DIAGNOSIS — Z9221 Personal history of antineoplastic chemotherapy: Secondary | ICD-10-CM | POA: Insufficient documentation

## 2013-08-24 DIAGNOSIS — I10 Essential (primary) hypertension: Secondary | ICD-10-CM | POA: Insufficient documentation

## 2013-08-24 DIAGNOSIS — Z79899 Other long term (current) drug therapy: Secondary | ICD-10-CM | POA: Insufficient documentation

## 2013-08-24 DIAGNOSIS — Z8249 Family history of ischemic heart disease and other diseases of the circulatory system: Secondary | ICD-10-CM | POA: Insufficient documentation

## 2013-08-24 DIAGNOSIS — Z833 Family history of diabetes mellitus: Secondary | ICD-10-CM | POA: Insufficient documentation

## 2013-08-24 DIAGNOSIS — D6481 Anemia due to antineoplastic chemotherapy: Secondary | ICD-10-CM

## 2013-08-24 LAB — CBC WITH DIFFERENTIAL/PLATELET
BASO%: 0.5 % (ref 0.0–2.0)
BASOS ABS: 0 10*3/uL (ref 0.0–0.1)
EOS%: 0.1 % (ref 0.0–7.0)
Eosinophils Absolute: 0 10*3/uL (ref 0.0–0.5)
HCT: 28.7 % — ABNORMAL LOW (ref 34.8–46.6)
HGB: 9.7 g/dL — ABNORMAL LOW (ref 11.6–15.9)
LYMPH%: 12.3 % — ABNORMAL LOW (ref 14.0–49.7)
MCH: 34.6 pg — AB (ref 25.1–34.0)
MCHC: 33.8 g/dL (ref 31.5–36.0)
MCV: 102.3 fL — AB (ref 79.5–101.0)
MONO#: 0.4 10*3/uL (ref 0.1–0.9)
MONO%: 5.4 % (ref 0.0–14.0)
NEUT#: 5.5 10*3/uL (ref 1.5–6.5)
NEUT%: 81.7 % — ABNORMAL HIGH (ref 38.4–76.8)
Platelets: 109 10*3/uL — ABNORMAL LOW (ref 145–400)
RBC: 2.8 10*6/uL — AB (ref 3.70–5.45)
RDW: 16.6 % — ABNORMAL HIGH (ref 11.2–14.5)
WBC: 6.7 10*3/uL (ref 3.9–10.3)
lymph#: 0.8 10*3/uL — ABNORMAL LOW (ref 0.9–3.3)

## 2013-08-24 NOTE — Patient Instructions (Signed)
Plan to follow up with Dr. Marko Plume and Dr. Fermin Schwab in two months.  You will be scheduled for a echocardiogram to evaluate cardiac function.  Please call for any questions or concerns.  Doxorubicin Liposomal injection What is this medicine? LIPOSOMAL DOXORUBICIN (LIP oh som al dox oh ROO bi sin) is a chemotherapy drug. This medicine is used to treat many kinds of cancer like Kaposi's sarcoma, multiple myeloma, and ovarian cancer. This medicine may be used for other purposes; ask your health care provider or pharmacist if you have questions. COMMON BRAND NAME(S): Doxil, Lipodox What should I tell my health care provider before I take this medicine? They need to know if you have any of these conditions: -blood disorders -heart disease -infection (especially a virus infection such as chickenpox, cold sores, or herpes) -liver disease -recent or ongoing radiation therapy -an unusual or allergic reaction to doxorubicin, other chemotherapy agents, soybeans, other medicines, foods, dyes, or preservatives -pregnant or trying to get pregnant -breast-feeding How should I use this medicine? This drug is given as an infusion into a vein. It is administered in a hospital or clinic by a specially trained health care professional. If you have pain, swelling, burning or any unusual feeling around the site of your injection, tell your health care professional right away. Talk to your pediatrician regarding the use of this medicine in children. Special care may be needed. Overdosage: If you think you have taken too much of this medicine contact a poison control center or emergency room at once. NOTE: This medicine is only for you. Do not share this medicine with others. What if I miss a dose? It is important not to miss your dose. Call your doctor or health care professional if you are unable to keep an appointment. What may interact with this medicine? Do not take this medicine with any of the following  medications: -zidovudine This medicine may also interact with the following medications: -medicines to increase blood counts like filgrastim, pegfilgrastim, sargramostim -vaccines Talk to your doctor or health care professional before taking any of these medicines: -acetaminophen -aspirin -ibuprofen -ketoprofen -naproxen This list may not describe all possible interactions. Give your health care provider a list of all the medicines, herbs, non-prescription drugs, or dietary supplements you use. Also tell them if you smoke, drink alcohol, or use illegal drugs. Some items may interact with your medicine. What should I watch for while using this medicine? Your condition will be monitored carefully while you are receiving this medicine. You will need important blood work done while you are taking this medicine. This drug may make you feel generally unwell. This is not uncommon, as chemotherapy can affect healthy cells as well as cancer cells. Report any side effects. Continue your course of treatment even though you feel ill unless your doctor tells you to stop. Your urine may turn orange-red for a few days after your dose. This is not blood. If your urine is dark or brown, call your doctor. In some cases, you may be given additional medicines to help with side effects. Follow all directions for their use. Call your doctor or health care professional for advice if you get a fever (100.5 degrees F or higher), chills or sore throat, or other symptoms of a cold or flu. Do not treat yourself. This drug decreases your body's ability to fight infections. Try to avoid being around people who are sick. This medicine may increase your risk to bruise or bleed. Call your doctor or health  care professional if you notice any unusual bleeding. Be careful brushing and flossing your teeth or using a toothpick because you may get an infection or bleed more easily. If you have any dental work done, tell your dentist  you are receiving this medicine. Avoid taking products that contain aspirin, acetaminophen, ibuprofen, naproxen, or ketoprofen unless instructed by your doctor. These medicines may hide a fever. Men and women of childbearing age should use effective birth control methods while using taking this medicine. Do not become pregnant while taking this medicine. There is a potential for serious side effects to an unborn child. Talk to your health care professional or pharmacist for more information. Do not breast-feed an infant while taking this medicine. Talk to your doctor about your risk of cancer. You may be more at risk for certain types of cancers if you take this medicine. What side effects may I notice from receiving this medicine? Side effects that you should report to your doctor or health care professional as soon as possible: -allergic reactions like skin rash, itching or hives, swelling of the face, lips, or tongue -low blood counts - this medicine may decrease the number of white blood cells, red blood cells and platelets. You may be at increased risk for infections and bleeding. -signs of hand-foot syndrome - tingling or burning, redness, flaking, swelling, small blisters, or small sores on the palms of your hands or the soles of your feet -signs of infection - fever or chills, cough, sore throat, pain or difficulty passing urine -signs of decreased platelets or bleeding - bruising, pinpoint red spots on the skin, black, tarry stools, blood in the urine -signs of decreased red blood cells - unusually weak or tired, fainting spells, lightheadedness -back pain, chills, facial flushing, fever, headache, tightness in the chest or throat during the infusion -breathing problems -chest pain -fast, irregular heartbeat -mouth pain, redness, sores -pain, swelling, redness at site where injected -pain, tingling, numbness in the hands or feet -swelling of ankles, feet, or hands -vomiting Side effects  that usually do not require medical attention (report to your doctor or health care professional if they continue or are bothersome): -diarrhea -hair loss -loss of appetite -nail discoloration or damage -nausea -red or watery eyes -red colored urine -stomach upset This list may not describe all possible side effects. Call your doctor for medical advice about side effects. You may report side effects to FDA at 1-800-FDA-1088. Where should I keep my medicine? This drug is given in a hospital or clinic and will not be stored at home. NOTE: This sheet is a summary. It may not cover all possible information. If you have questions about this medicine, talk to your doctor, pharmacist, or health care provider.  2014, Elsevier/Gold Standard. (2012-01-28 10:12:56)

## 2013-08-24 NOTE — Progress Notes (Signed)
Consult Note: Gyn-Onc   Deno Etienne 58 y.o. female  Chief Complaint  Patient presents with  . 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 The patient is now received 9 cycles of carboplatin and Taxol and based on rising CA 125 value is having progressive disease. Currently being treated for urinary tract infection. Plan:  The current situation was reviewed with the patient and her sister. The CT scan while stable has not improved over the last 3 cycles of chemotherapy. Further, the patient CA 125 has increased from 494 units per mL on February 16 2 732 units per mL on March 16. Treatment options were discussed with the patient. All things considered I recommend she switched to Doxil. The Gen. side effects of Doxil including PPE were discussed with the patient and her sister. We'll obtain a MUGA scan to assure her cardiac function. The patient return to see Dr. Bobby Rumpf in the near future to initiate chemotherapy. I will contact her urologist informed him that we do not plan surgery and that he should go ahead with stent exchange.. Interval History: The patient returns for followup. She has now completed 9 cycles of dose dense carboplatin and Taxol chemotherapy. A recent CT scan shows essentially stable sized the pelvic mass.. A recent CA 125 value was elevated since first initiating chemotherapy (732 versus prior value of 494 units per mL) she denies any GI or GU symptoms. Overall her functional status is good. She is currently being treated for urinary tract infection and needs to have her stent exchange. Overall the patient feels much better than she did before reinitiating chemotherapy. Her functional status is very good.. She is not having any neuropathy. HPI:58 year old white female initially seen in consultation at the request of Dr Julieanne Manson regarding a newly diagnosed pelvic mass. The patient was initially admitted for evaluation of  hydronephrosis and a UTI. Further evaluation revealed a pelvic mass retroperitoneal adenopathy. From a gynecologic point of view the patient denies any past gynecologic history. She said she had a pelvic examination in June which is relatively normal. 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 Patient denies any pelvic pain. She went through menopause at about age 37 and has not had any bleeding.   Given the extent of disease and initiated dose dense carboplatin and Taxol in a neoadjuvant fashion. Overall she received 9 cycles of carboplatin and Taxol. And initiation of therapy her CA 125 was 5626 and felt to a low value of 494 on every 16 2015. However, on 08/06/2013 CA 125 Rose to 732 units per mL. CT scan at that time showed stable disease over the prior 3 months.   Review of Systems:10 point review of systems is negative except as noted in interval history.   Vitals: Blood pressure 133/67, pulse 84, temperature 98.4 F (36.9 C), temperature source Oral, resp. rate 16, height 5\' 1"  (1.549 m), weight 140 lb 3.2 oz (63.594 kg).  Physical Exam: General : The patient is a healthy woman in no acute distress.  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 This is easily reducible. The suprapubic mass seems to have resolved. There is no evidence of ascites.  Pelvic:  Deferred today. At the patient's request.   Allergies  Allergen Reactions  . Penicillins     REACTION: RASH    Past Medical History  Diagnosis Date  . Hypertension   . Colitis     [place]  . GI bleed   . ovarian ca dx'd 12/2012    Past Surgical History  Procedure Laterality Date  . Ileostomy  12/27/1988    Removal of Large Intestine---Tim  Rosana Hoes, Psychologist, sport and exercise  . Cholecystectomy  1991-1992  . Cystoscopy w/ ureteral stent placement Right 01/12/2013    Procedure: CYSTOSCOPY WITH RETROGRADE PYELOGRAM/ RIGHT DOUBLE J STENT PLACEMENT;  Surgeon: Franchot Gallo, MD;  Location: [facility];  Service: Urology;  Laterality: Right;    Current Outpatient Prescriptions  Medication Sig Dispense Refill  . acyclovir (ZOVIRAX) 200 MG capsule Take 1 capsule (200 mg total) by mouth 3 (three) times daily. For fever blister  30 capsule  2  . Calcium Carbonate-Vitamin D (CALCIUM + D PO) Take 1 tablet by mouth daily.       Marland Kitchen dexamethasone (DECADRON) 4 MG tablet Take 2 tablets with food twice a day for 3 days.  Begin day prior to Taxotere  Chemotherapy      . Fe Fum-FA-B Cmp-C-Zn-Mg-Mn-Cu (FERROCITE PLUS) 106-1 MG TABS Take 1 tablet by mouth daily.  30 each  5  . fluconazole (DIFLUCAN) 100 MG tablet Take 1 tablet (100 mg total) by mouth daily.  7 tablet  0  . HYDROcodone-acetaminophen (NORCO/VICODIN) 5-325 MG per tablet Take 1 tablet every 4-6 hours as needed for pain  30 tablet  0  . lidocaine-prilocaine (EMLA) cream Apply topically as needed. Apply 1-2 hours prior to Porta-cath access.  30 g  2  . LORazepam (ATIVAN) 0.5 MG tablet Take 1 tablet (0.5 mg total) by mouth every 6 (six) hours as needed (for nausea).  30 tablet  0  . Multiple Vitamin (MULTIVITAMIN WITH MINERALS) TABS tablet Take 1 tablet by mouth daily.      . ondansetron (ZOFRAN) 8 MG tablet Take 1-2 tablets (8-16 mg total) by mouth every 12 (twelve) hours as needed for nausea.  30 tablet  2  . oxybutynin (DITROPAN) 5 MG tablet Take 5 mg by mouth 3 (three) times daily.      . sodium chloride (OCEAN) 0.65 % nasal spray Place 1 spray into the nose. Use every 1-3 hours while awake to keep nose moist, which helps to prevent nosebleeds      . ciprofloxacin (CIPRO) 250 MG tablet Take 1 tablet (250 mg total) by mouth 2 (two) times daily.  14 tablet  0  . lisinopril-hydrochlorothiazide (ZESTORETIC) 10-12.5  MG per tablet Take 1 tablet by mouth daily.  90 tablet  3  . oxymetazoline (AFRIN) 0.05 % nasal spray Place into the nose as needed for congestion (Helps stop nosebleed if it starts).       No current facility-administered medications for this visit.   Facility-Administered Medications Ordered in Other Visits  Medication Dose Route Frequency Provider Last Rate Last Dose  . sodium chloride 0.9 % injection 10 mL  10 mL Intravenous PRN Gordy Levan, MD   10 mL at 02/19/13 8119    History   Social History  . Marital Status: Divorced    Spouse Name: N/A    Number of Children: 2  . Years of Education: N/A   Occupational History  . [employer] [place] History Main Topics  . Smoking status: Never Smoker   .  Smokeless tobacco: Never Used  . Alcohol Use: No  . Drug Use: No  . Sexual Activity: No   Other Topics Concern  . Not on file   Social History Narrative   Gets reg exercise    Family History  Problem Relation Age of Onset  . Hypertension Mother   . Arthritis Mother   . Hypertension Father   . Diabetes Father   . Hypertension Brother   . Diabetes Brother       Alvino Chapel, MD 08/24/2013, 11:17 AM

## 2013-08-27 ENCOUNTER — Other Ambulatory Visit: Payer: Self-pay | Admitting: Oncology

## 2013-08-27 ENCOUNTER — Other Ambulatory Visit: Payer: Self-pay | Admitting: *Deleted

## 2013-08-27 DIAGNOSIS — C569 Malignant neoplasm of unspecified ovary: Secondary | ICD-10-CM

## 2013-08-28 ENCOUNTER — Telehealth: Payer: Self-pay | Admitting: Oncology

## 2013-08-28 NOTE — Progress Notes (Signed)
Reviewed Doxil Chemotherapy and side effect management handouts with Kirsten Schroeder.  Pt. Verbalized understanding.

## 2013-08-28 NOTE — Telephone Encounter (Signed)
, °

## 2013-09-04 ENCOUNTER — Other Ambulatory Visit: Payer: Self-pay | Admitting: Urology

## 2013-09-06 ENCOUNTER — Encounter (HOSPITAL_COMMUNITY)
Admission: RE | Admit: 2013-09-06 | Discharge: 2013-09-06 | Disposition: A | Discharge status: Home / Self Care | Payer: BC Managed Care – PPO | Source: Ambulatory Visit | Attending: Gynecology | Admitting: Gynecology

## 2013-09-06 DIAGNOSIS — C569 Malignant neoplasm of unspecified ovary: Secondary | ICD-10-CM | POA: Insufficient documentation

## 2013-09-06 MED ORDER — TECHNETIUM TC 99M-LABELED RED BLOOD CELLS IV KIT
23.7000 | PACK | Freq: Once | INTRAVENOUS | Status: AC | PRN
  Administered 2013-09-06: 24 via INTRAVENOUS

## 2013-09-07 ENCOUNTER — Other Ambulatory Visit: Payer: Self-pay | Admitting: Oncology

## 2013-09-10 ENCOUNTER — Encounter: Payer: Self-pay | Admitting: Oncology

## 2013-09-10 ENCOUNTER — Ambulatory Visit (HOSPITAL_BASED_OUTPATIENT_CLINIC_OR_DEPARTMENT_OTHER): Payer: BC Managed Care – PPO

## 2013-09-10 ENCOUNTER — Telehealth: Payer: Self-pay | Admitting: *Deleted

## 2013-09-10 ENCOUNTER — Telehealth: Payer: Self-pay | Admitting: Oncology

## 2013-09-10 ENCOUNTER — Ambulatory Visit (HOSPITAL_BASED_OUTPATIENT_CLINIC_OR_DEPARTMENT_OTHER): Payer: BC Managed Care – PPO | Admitting: Oncology

## 2013-09-10 VITALS — BP 144/77 | HR 87 | Temp 98.8°F | Resp 17 | Ht 61.0 in | Wt 145.7 lb

## 2013-09-10 DIAGNOSIS — M949 Disorder of cartilage, unspecified: Secondary | ICD-10-CM

## 2013-09-10 DIAGNOSIS — M899 Disorder of bone, unspecified: Secondary | ICD-10-CM

## 2013-09-10 DIAGNOSIS — C569 Malignant neoplasm of unspecified ovary: Secondary | ICD-10-CM

## 2013-09-10 DIAGNOSIS — G609 Hereditary and idiopathic neuropathy, unspecified: Secondary | ICD-10-CM

## 2013-09-10 DIAGNOSIS — C801 Malignant (primary) neoplasm, unspecified: Secondary | ICD-10-CM

## 2013-09-10 DIAGNOSIS — M7989 Other specified soft tissue disorders: Secondary | ICD-10-CM

## 2013-09-10 DIAGNOSIS — E876 Hypokalemia: Secondary | ICD-10-CM

## 2013-09-10 DIAGNOSIS — N1330 Unspecified hydronephrosis: Secondary | ICD-10-CM

## 2013-09-10 DIAGNOSIS — R21 Rash and other nonspecific skin eruption: Secondary | ICD-10-CM

## 2013-09-10 LAB — CBC WITH DIFFERENTIAL/PLATELET
BASO%: 0.9 % (ref 0.0–2.0)
Basophils Absolute: 0 10*3/uL (ref 0.0–0.1)
EOS%: 2.5 % (ref 0.0–7.0)
Eosinophils Absolute: 0.1 10*3/uL (ref 0.0–0.5)
HCT: 34.6 % — ABNORMAL LOW (ref 34.8–46.6)
HGB: 11.5 g/dL — ABNORMAL LOW (ref 11.6–15.9)
LYMPH%: 17.7 % (ref 14.0–49.7)
MCH: 34.3 pg — AB (ref 25.1–34.0)
MCHC: 33.4 g/dL (ref 31.5–36.0)
MCV: 102.7 fL — AB (ref 79.5–101.0)
MONO#: 0.5 10*3/uL (ref 0.1–0.9)
MONO%: 10.4 % (ref 0.0–14.0)
NEUT#: 3.4 10*3/uL (ref 1.5–6.5)
NEUT%: 68.5 % (ref 38.4–76.8)
Platelets: 186 10*3/uL (ref 145–400)
RBC: 3.37 10*6/uL — AB (ref 3.70–5.45)
RDW: 16.8 % — AB (ref 11.2–14.5)
WBC: 4.9 10*3/uL (ref 3.9–10.3)
lymph#: 0.9 10*3/uL (ref 0.9–3.3)

## 2013-09-10 NOTE — Telephone Encounter (Signed)
Per staff message and POF I have scheduled appts.  JMW  

## 2013-09-10 NOTE — Telephone Encounter (Signed)
, °

## 2013-09-10 NOTE — Progress Notes (Signed)
OFFICE PROGRESS NOTE   09/10/2013   Physicians:ClarkePearson, Quillian Quince; Evalee Mutton, Marlene Bast, Lorriane Shire; Dahlstedt, Annie Main   INTERVAL HISTORY:  Patient is seen, alone for visit, in follow up of papillary serous gyn malignancy (likely ovarian), as plan is now to continue chemotherapy with a different regimen rather than any attempt at debulking surgery. She completed 9 cycles of carboplatin + taxane from 01-31-2013 thru 08-08-2013. CT AP 08-22-2013 showed pelvic mass slightly smaller at 5.4 x 5.5 cm, encasing right ureter (with mild increase in right hydronephrosis despite stent), mild right iliac adenopathy, stable sclerotic bone lesions in pelvis and right hip (no path confirmation of bone areas); CA 125 increased from 494 in Feb to 732 in March.She saw Dr Josephina Shih 08-24-2013, with situation not appropriate for surgery. Patient understands that treatment will be in attempt to improve or control the disease as best possible, but that we do not expect that this can be curative. She will see Dr Josephina Shih again in ~ June. She had MUGA 09-06-13 with EF 56% and no wall motion abnormalities. She had doxil teaching by RN prior to this visit.  She is for right ureteral stent change by Dr Diona Fanti later this week. She is completing a course of nitrofurantoin for UTI by culture at urology office, apparently not symptomatic when recheck done. She is tolerating this antibiotic without difficulty. She has felt gradually better in break off of chemotherapy, with taste improved within the past week and appetite much better since then. Output from ileostomy is at usual baseline. Energy is some better. She has no abdominal or pelvic pain, no bleeding, no fever.  She has PAC and right JJ stent  She prefers treatments on Wednesdays as possible, as daughter is off work on Wednesdays.   ONCOLOGIC HISTORY Patient presented with RLE swelling negative for DVT July 2014, and right hydronephrosis.  Biopsy of left inguinal node ([id number]) found carcinoma consistent with serous gyn primary.Neoadjuvant dose dense taxol carboplatin began 01-31-13, with improvement in pelvic mass and adenopathy by scans after 3 cycles.. She had 3 additional cycles, with progressive decrease in CA 125 from 5626 in 12-2012 to 596 by Jan 2015. CT 06-14-13 had ~ stable pelvic mass and adenopathy. Dr Josephina Shih recommended another 3 cycles chemotherapy, regimen changed to q 3 week taxol carboplatin on 06-27-13, which unfortunately caused worsening of peripheral neuropathy and severe taxol aches.Taxotere was substituted for taxol due to neuropathy in feet for cycles 8 and 9, completed 08-08-13. CA 125 improved to low of 494 in Feb 2015, then 732 just prior to cycle 9.    Review of systems as above, also: No SOB or cough with activity in office now. Is generally able to sleep. No problems with PAC. She is claustrophobic (MUGA difficult but managed). Increased swelling RLE as she believes she gets when ureteral stent needs change (?) Remainder of 10 point Review of Systems negative.  Objective:  Vital signs in last 24 hours:  BP 144/77  Pulse 87  Temp(Src) 98.8 F (37.1 C) (Oral)  Resp 17  Ht 5\' 1"  (1.549 m)  Wt 145 lb 11.2 oz (66.089 kg)  BMI 27.54 kg/m2 Weight is up 5 lbs. Alert, oriented and appropriate. Ambulatory without difficulty.  Alopecia  HEENT:PERRL, sclerae not icteric. Oral mucosa moist without lesions, posterior pharynx clear.  Neck supple. No JVD.  Lymphatics:no cervical,suraclavicular, axillary or inguinal adenopathy Resp: clear to auscultation bilaterally and normal percussion bilaterally Cardio: regular rate and rhythm. No gallop. GI: soft, nontender, not distended, no  mass or organomegaly. Normally active bowel sounds. Surgical incision not remarkable.Ileostomy unchanged. Musculoskeletal/ Extremities: tight swelling lower right leg and lateral right thigh > left, no cords or  tenderness. Neuro: no change peripheral neuropathy. Otherwise nonfocal Skin without rash, ecchymosis, petechiae Portacath-without erythema or tenderness  Lab Results:  Results for orders placed in visit on 09/10/13  CBC WITH DIFFERENTIAL      Result Value Ref Range   WBC 4.9  3.9 - 10.3 10e3/uL   NEUT# 3.4  1.5 - 6.5 10e3/uL   HGB 11.5 (*) 11.6 - 15.9 g/dL   HCT 34.6 (*) 34.8 - 46.6 %   Platelets 186  145 - 400 10e3/uL   MCV 102.7 (*) 79.5 - 101.0 fL   MCH 34.3 (*) 25.1 - 34.0 pg   MCHC 33.4  31.5 - 36.0 g/dL   RBC 3.37 (*) 3.70 - 5.45 10e6/uL   RDW 16.8 (*) 11.2 - 14.5 %   lymph# 0.9  0.9 - 3.3 10e3/uL   MONO# 0.5  0.1 - 0.9 10e3/uL   Eosinophils Absolute 0.1  0.0 - 0.5 10e3/uL   Basophils Absolute 0.0  0.0 - 0.1 10e3/uL   NEUT% 68.5  38.4 - 76.8 %   LYMPH% 17.7  14.0 - 49.7 %   MONO% 10.4  0.0 - 14.0 %   EOS% 2.5  0.0 - 7.0 %   BASO% 0.9  0.0 - 2.0 %    Last CMET 08-06-13 noted. CMET and CA125 ordered from [facility] day of chemo 09-19-13.  Studies/Results: CT ABDOMEN AND PELVIS WITH CONTRAST 08-22-2013   COMPARISON: 06/14/2013  FINDINGS:  Soft tissue mass in the central pelvis and right adnexal region  shows mild decrease in size, currently measuring 5.4 x 5.5 cm on  image 62 compared to 6.1 x 6.0 cm previously. This mass continues to  encompass the distal right ureter, with right ureteral stent  remaining in appropriate position. Mild increase in moderate right  hydronephrosis is seen, and ureteral stent malfunction cannot be  excluded.  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 Ill-defined area of decreased enhancement is  seen in the anterior midpole of the right kidney which is new since  previous study and suspicious for pyelonephritis. No evidence of  renal neoplasm. No evidence of left-sided hydronephrosis.  Mild right iliac lymphadenopathy is seen at the level of the iliac  bifurcation on image 63. This measures 1.4 cm in  is stable since  previous study. No other lymphadenopathy identified within the  pelvis. Tiny less than 1 cm retroperitoneal lymph nodes in the  aortocaval space are stable. No pathologically enlarged lymph nodes  are seen within the abdomen.  No other soft tissue masses are identified and there is no evidence  of ascites. The liver, pancreas, spleen, and adrenal glands are  normal in appearance. Surgical clips again seen from prior  cholecystectomy. No evidence of inflammatory process or abscess.  Patient is status post total colectomy. Right lower quadrant  ileostomy is again seen with a large parastomal hernia containing  multiple loops of small bowel. No evidence of small bowel  obstruction or ischemia. A small suprapubic ventral hernia is also  seen near the midline containing only fat which is new compared to  prior exam.  Multiple sclerotic bone lesions involving the pelvis and right hip  appear stable, consistent with bone metastases.  IMPRESSION:  Mild decrease in size of soft tissue mass in the central pelvis and  right adnexa.  Stable mild right iliac lymphadenopathy. Stable sclerotic pelvic  bone metastases.  No new or progressive metastatic disease identified within the  abdomen or pelvis.  Mild increase in moderate right hydronephrosis. Right ureteral stent  remains in appropriate position, however stent malfunction cannot be  excluded by imaging.  New focus of decreased enhancement in the anterior midpole the right  kidney, consistent with pyelonephritis.     NUCLEAR MEDICINE CARDIAC BLOOD POOL IMAGING (MUGA)  09-06-13 TECHNIQUE:  Cardiac multi-gated acquisition was performed at rest following  intravenous injection of Tc-40m labeled red blood cells.  RADIOPHARMACEUTICALS: 24MILLI CURIE ULTRATAG TECHNETIUM TC  30M-LABELED RED BLOOD CELLS IV KITmCiTc-68m in-vitro labeled red  blood cells.  COMPARISON: None  FINDINGS:  No focal wall motion abnormality of the left  ventricle. . Calculated  left ventricular ejection fraction equals 56 %  IMPRESSION:  1. Left ventricular ejection fraction equals 56%.  2. No wall motion abnormality.   MUGA findings discussed now.  Medications: I have reviewed the patient's current medications.  DISCUSSION: we have discussed CT information, her conversation with Dr Josephina Shih, recommendation for doxil q 4 weeks and review of side effects including skin problems. She has had all questions answered and is in agreement with this plan, and expect cycle 1 on Wed. 09-19-13.   Assessment/Plan: 1. serous gyn carcinoma: large pelvic mass with right ureteral obstruction, and small bowel fistula into the mass Aug 2014: clinical improvement with carbo/taxane chemo thus far, tho some increase in CA 125 with 9th cycle. She appears to have reached maximum benefit from these agents and is not surgical candidate. Will change regimen to doxil beginning 09-19-13. Will need to be aware of ileostomy dressings with the doxil skin sensitivity, may need to discuss with ostomy RN. 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 Last iron studies 02-19-13 had serum iron 90, %sat 40  6. PAC in  7.completing antibiotics for UTI per Dr Diona Fanti  8.rash to PCN  9. Chemo related cytopenias all improved. Will check counts ~ 2 weeks after first doxil and add gCSF then if needed. 10. Extremely sensitive to oral contrast with CTs causing diarrhea, needs water based oral contrast regimen in radiology. Hypokalemia: related to ileostomy output, generally manages with diet.  11.RLE swelling: no DVT by prior evaluations   12.weight loss: dietician aware, appetite better with improvement in taste.    Chemo orders completed. Consent will be signed prior to first treatment. Antiemetics as noted.    Gordy Levan, MD   09/10/2013, 12:29 PM

## 2013-09-11 ENCOUNTER — Telehealth: Payer: Self-pay | Admitting: Oncology

## 2013-09-11 ENCOUNTER — Other Ambulatory Visit: Payer: Self-pay | Admitting: Oncology

## 2013-09-11 NOTE — Progress Notes (Signed)
[facility] END OF TREATMENT   Name: SIRIA CALANDRO Date: 09/11/2013 MRN: [id number] DOB: 19-Mar-1956   TREATMENT DATES: 01-31-2013 thru 08-08-13.   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 Cytopenias requiring gCSF and transfusion PRBCs. Taxol aches. Taste changes and decreased appetite. Weakness, fatigue   REASON TREATMENT STOPPED: maximum benefit   PERFORMANCE STATUS AT END: 1-2   ONGOING PROBLEMS: peripheral neuropathy, cytopenias, fatigue, taste change   FOLLOW UP PLANS: CT AP and follow up with gyn oncology and medical oncology. Possible surgery vs further chemo depending on results of CT.

## 2013-09-12 ENCOUNTER — Encounter (HOSPITAL_BASED_OUTPATIENT_CLINIC_OR_DEPARTMENT_OTHER): Payer: Self-pay | Admitting: *Deleted

## 2013-09-12 NOTE — Progress Notes (Addendum)
NPO AFTER MN WITH EXCEPTION CLEAR LIQUIDS UNTIL 0830 (NO CREAM/ MILK PRODUCTS).  ARRIVE AT 1315. NEEDS HG. CURRENT EKG IN CHART AND EPIC. WILL TAKE OXYBUTYNIN AM DOS W/ SIP OF WATER. LAST CHEMO 08-09-2013.

## 2013-09-13 ENCOUNTER — Encounter (HOSPITAL_BASED_OUTPATIENT_CLINIC_OR_DEPARTMENT_OTHER)
Admission: RE | Disposition: A | Discharge status: Home / Self Care | Payer: Self-pay | Source: Ambulatory Visit | Attending: Urology

## 2013-09-13 ENCOUNTER — Encounter (HOSPITAL_BASED_OUTPATIENT_CLINIC_OR_DEPARTMENT_OTHER): Payer: Self-pay

## 2013-09-13 ENCOUNTER — Encounter (HOSPITAL_BASED_OUTPATIENT_CLINIC_OR_DEPARTMENT_OTHER): Payer: BC Managed Care – PPO | Admitting: Anesthesiology

## 2013-09-13 ENCOUNTER — Ambulatory Visit (HOSPITAL_BASED_OUTPATIENT_CLINIC_OR_DEPARTMENT_OTHER): Payer: BC Managed Care – PPO | Admitting: Anesthesiology

## 2013-09-13 ENCOUNTER — Ambulatory Visit (HOSPITAL_BASED_OUTPATIENT_CLINIC_OR_DEPARTMENT_OTHER)
Admission: RE | Admit: 2013-09-13 | Discharge: 2013-09-13 | Disposition: A | Discharge status: Home / Self Care | Payer: BC Managed Care – PPO | Source: Ambulatory Visit | Attending: Urology | Admitting: Urology

## 2013-09-13 DIAGNOSIS — C7952 Secondary malignant neoplasm of bone marrow: Secondary | ICD-10-CM

## 2013-09-13 DIAGNOSIS — N289 Disorder of kidney and ureter, unspecified: Secondary | ICD-10-CM | POA: Insufficient documentation

## 2013-09-13 DIAGNOSIS — Z9089 Acquired absence of other organs: Secondary | ICD-10-CM | POA: Insufficient documentation

## 2013-09-13 DIAGNOSIS — N1330 Unspecified hydronephrosis: Secondary | ICD-10-CM

## 2013-09-13 DIAGNOSIS — I10 Essential (primary) hypertension: Secondary | ICD-10-CM | POA: Insufficient documentation

## 2013-09-13 DIAGNOSIS — C7951 Secondary malignant neoplasm of bone: Secondary | ICD-10-CM | POA: Insufficient documentation

## 2013-09-13 DIAGNOSIS — Z79899 Other long term (current) drug therapy: Secondary | ICD-10-CM | POA: Insufficient documentation

## 2013-09-13 DIAGNOSIS — C569 Malignant neoplasm of unspecified ovary: Secondary | ICD-10-CM

## 2013-09-13 DIAGNOSIS — D649 Anemia, unspecified: Secondary | ICD-10-CM | POA: Insufficient documentation

## 2013-09-13 HISTORY — DX: Presence of spectacles and contact lenses: Z97.3

## 2013-09-13 HISTORY — DX: Abnormal result of cardiovascular function study, unspecified: R94.30

## 2013-09-13 HISTORY — DX: Secondary malignant neoplasm of bone: C79.51

## 2013-09-13 HISTORY — DX: Presence of other vascular implants and grafts: Z95.828

## 2013-09-13 HISTORY — DX: Reserved for concepts with insufficient information to code with codable children: IMO0002

## 2013-09-13 HISTORY — DX: Unspecified hydronephrosis: N13.30

## 2013-09-13 HISTORY — PX: CYSTOSCOPY W/ URETERAL STENT PLACEMENT: SHX1429

## 2013-09-13 HISTORY — DX: Ulcerative colitis, unspecified, without complications: K51.90

## 2013-09-13 HISTORY — DX: Malignant neoplasm of unspecified ovary: C56.9

## 2013-09-13 LAB — POCT HEMOGLOBIN-HEMACUE: Hemoglobin: 11.4 g/dL — ABNORMAL LOW (ref 12.0–15.0)

## 2013-09-13 SURGERY — CYSTOSCOPY, FLEXIBLE, WITH STENT REPLACEMENT
Anesthesia: General | Site: Ureter | Laterality: Right

## 2013-09-13 MED ORDER — OXYCODONE HCL 5 MG/5ML PO SOLN
5.0000 mg | Freq: Once | ORAL | Status: DC | PRN
  Filled 2013-09-13: qty 5

## 2013-09-13 MED ORDER — ACETAMINOPHEN 650 MG RE SUPP
650.0000 mg | RECTAL | Status: DC | PRN
  Filled 2013-09-13: qty 1

## 2013-09-13 MED ORDER — STERILE WATER FOR IRRIGATION IR SOLN
Status: DC | PRN
  Administered 2013-09-13: 3000 mL via INTRAVESICAL

## 2013-09-13 MED ORDER — FENTANYL CITRATE 0.05 MG/ML IJ SOLN
INTRAMUSCULAR | Status: DC | PRN
  Administered 2013-09-13: 50 ug via INTRAVENOUS

## 2013-09-13 MED ORDER — PROMETHAZINE HCL 25 MG/ML IJ SOLN
6.2500 mg | INTRAMUSCULAR | Status: DC | PRN
  Filled 2013-09-13: qty 1

## 2013-09-13 MED ORDER — BELLADONNA ALKALOIDS-OPIUM 16.2-60 MG RE SUPP
RECTAL | Status: AC
  Filled 2013-09-13: qty 1

## 2013-09-13 MED ORDER — CIPROFLOXACIN IN D5W 400 MG/200ML IV SOLN
400.0000 mg | INTRAVENOUS | Status: AC
  Administered 2013-09-13: 400 mg via INTRAVENOUS
  Filled 2013-09-13: qty 200

## 2013-09-13 MED ORDER — LACTATED RINGERS IV SOLN
INTRAVENOUS | Status: DC
  Administered 2013-09-13: 11:00:00 via INTRAVENOUS
  Filled 2013-09-13: qty 1000

## 2013-09-13 MED ORDER — SODIUM CHLORIDE 0.9 % IJ SOLN
3.0000 mL | Freq: Two times a day (BID) | INTRAMUSCULAR | Status: DC
  Filled 2013-09-13: qty 3

## 2013-09-13 MED ORDER — ONDANSETRON HCL 4 MG/2ML IJ SOLN
INTRAMUSCULAR | Status: DC | PRN
  Administered 2013-09-13: 4 mg via INTRAVENOUS

## 2013-09-13 MED ORDER — MIDAZOLAM HCL 5 MG/5ML IJ SOLN
INTRAMUSCULAR | Status: DC | PRN
  Administered 2013-09-13: 2 mg via INTRAVENOUS

## 2013-09-13 MED ORDER — FENTANYL CITRATE 0.05 MG/ML IJ SOLN
INTRAMUSCULAR | Status: AC
  Filled 2013-09-13: qty 2

## 2013-09-13 MED ORDER — SODIUM CHLORIDE 0.9 % IJ SOLN
3.0000 mL | INTRAMUSCULAR | Status: DC | PRN
  Filled 2013-09-13: qty 3

## 2013-09-13 MED ORDER — ACETAMINOPHEN 325 MG PO TABS
650.0000 mg | ORAL_TABLET | ORAL | Status: DC | PRN
  Filled 2013-09-13: qty 2

## 2013-09-13 MED ORDER — HYDROMORPHONE HCL PF 1 MG/ML IJ SOLN
0.2500 mg | INTRAMUSCULAR | Status: DC | PRN
  Filled 2013-09-13: qty 1

## 2013-09-13 MED ORDER — OXYCODONE HCL 5 MG PO TABS
5.0000 mg | ORAL_TABLET | ORAL | Status: DC | PRN
  Filled 2013-09-13: qty 2

## 2013-09-13 MED ORDER — MIDAZOLAM HCL 2 MG/2ML IJ SOLN
INTRAMUSCULAR | Status: AC
  Filled 2013-09-13: qty 2

## 2013-09-13 MED ORDER — DEXAMETHASONE SODIUM PHOSPHATE 4 MG/ML IJ SOLN
INTRAMUSCULAR | Status: DC | PRN
  Administered 2013-09-13: 10 mg via INTRAVENOUS

## 2013-09-13 MED ORDER — SULFAMETHOXAZOLE-TMP DS 800-160 MG PO TABS: 1.0000 | ORAL_TABLET | Freq: Two times a day (BID) | ORAL | Status: DC

## 2013-09-13 MED ORDER — PROPOFOL 10 MG/ML IV BOLUS
INTRAVENOUS | Status: DC | PRN
  Administered 2013-09-13: 150 mg via INTRAVENOUS

## 2013-09-13 MED ORDER — MEPERIDINE HCL 25 MG/ML IJ SOLN
6.2500 mg | INTRAMUSCULAR | Status: DC | PRN
  Filled 2013-09-13: qty 1

## 2013-09-13 MED ORDER — SODIUM CHLORIDE 0.9 % IV SOLN
250.0000 mL | INTRAVENOUS | Status: DC | PRN
  Filled 2013-09-13: qty 250

## 2013-09-13 MED ORDER — OXYCODONE HCL 5 MG PO TABS
5.0000 mg | ORAL_TABLET | Freq: Once | ORAL | Status: DC | PRN
  Filled 2013-09-13: qty 1

## 2013-09-13 MED ORDER — LIDOCAINE HCL (CARDIAC) 20 MG/ML IV SOLN
INTRAVENOUS | Status: DC | PRN
  Administered 2013-09-13: 50 mg via INTRAVENOUS

## 2013-09-13 SURGICAL SUPPLY — 23 items
ADAPTER CATH URET PLST 4-6FR (CATHETERS) IMPLANT
BAG DRAIN URO-CYSTO SKYTR STRL (DRAIN) ×3 IMPLANT
CANISTER SUCT LVC 12 LTR MEDI- (MISCELLANEOUS) ×3 IMPLANT
CATH INTERMIT  6FR 70CM (CATHETERS) IMPLANT
CLOTH BEACON ORANGE TIMEOUT ST (SAFETY) ×3 IMPLANT
DRAPE CAMERA CLOSED 9X96 (DRAPES) ×3 IMPLANT
GLOVE BIO SURGEON STRL SZ8 (GLOVE) ×3 IMPLANT
GLOVE BIOGEL M 6.5 STRL (GLOVE) ×3 IMPLANT
GLOVE BIOGEL M STER SZ 6 (GLOVE) ×3 IMPLANT
GLOVE BIOGEL PI IND STRL 6.5 (GLOVE) ×2 IMPLANT
GLOVE BIOGEL PI INDICATOR 6.5 (GLOVE) ×4
GOWN PREVENTION PLUS LG XLONG (DISPOSABLE) IMPLANT
GOWN STRL REIN XL XLG (GOWN DISPOSABLE) IMPLANT
GOWN STRL REUS W/TWL LRG LVL3 (GOWN DISPOSABLE) ×3 IMPLANT
GOWN STRL REUS W/TWL XL LVL3 (GOWN DISPOSABLE) ×3 IMPLANT
GUIDEWIRE 0.038 PTFE COATED (WIRE) IMPLANT
GUIDEWIRE ANG ZIPWIRE 038X150 (WIRE) IMPLANT
GUIDEWIRE STR DUAL SENSOR (WIRE) ×3 IMPLANT
NS IRRIG 500ML POUR BTL (IV SOLUTION) IMPLANT
PACK CYSTOSCOPY (CUSTOM PROCEDURE TRAY) ×3 IMPLANT
STENT 6X24 (STENTS) IMPLANT
STENT URET 6FRX24 CONTOUR (STENTS) ×3 IMPLANT
WATER STERILE IRR 3000ML UROMA (IV SOLUTION) ×3 IMPLANT

## 2013-09-13 NOTE — H&P (Signed)
  H&P  Chief Complaint: Blocked right kidney  History of Present Illness: Kirsten Schroeder is a 58 y.o. year old female who presents for exchange of a right ureteral stent placed for malignant hydronephrosis.  She underwent stent placement on the right in August, 2014 for treatment of malignant right hydronephrosis.   She has tolerated her stent well. Most recent CT scan showed perhaps increasing hydronephrosis on the right. Her tumor markers have actually increased somewhat, despite smaller tumor size on CT scan. She will be on Doxil in the near future as a new chemotherapy.    Past Medical History  Diagnosis Date  . Ulcerative colitis   . Hydronephrosis, right   . Ejection fraction < 50%     56%  AND NORMAL WALL MOTION --  PER  MUGA  09-06-2013  . 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 Wears glasses   . Hypertension     PT CURRENT NOT TAKING BP MEDICATION PER PCP SINCE BP IS LOW SECONDARY TO CHEMO  . 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 Bone metastasis     MILD ---  SECONDARY OVARIAN CANCER    Past Surgical History  Procedure Laterality Date  . Cystoscopy w/ ureteral stent placement Right 01/12/2013    Procedure: CYSTOSCOPY WITH RETROGRADE PYELOGRAM/ RIGHT DOUBLE J STENT PLACEMENT;  Surgeon: Franchot Gallo, MD;  Location: [facility];  Service: Urology;  Laterality: Right;  . Cesarean section    . Cholecystectomy  1992  . Total colectomy w/ permanent ileostomy  1990  (2 PART SURGERY)    [facility]    Home Medications:  No prescriptions prior to admission    Allergies:  Allergies  Allergen Reactions  . Penicillins Rash    Family History  Problem Relation Age of Onset  . Hypertension Mother   . Arthritis Mother   . Hypertension Father   . Diabetes Father   . Hypertension Brother   . Diabetes Brother     Social History:  reports that she has never smoked. She has never used  smokeless tobacco. She reports that she does not drink alcohol or use illicit drugs.  ROS: A complete review of systems was performed.  All systems are negative except for pertinent findings as noted.  Physical Exam:  Vital signs in last 24 hours:   General:  Alert and oriented, No acute distress HEENT: Normocephalic, atraumatic Neck: No JVD or lymphadenopathy Cardiovascular: Regular rate and rhythm Lungs: Clear bilaterally Abdomen: Soft, nontender, nondistended, no abdominal masses Back: No CVA tenderness Extremities: No edema Neurologic: Grossly intact  Laboratory Data:  No results found for this or any previous visit (from the past 24 hour(s)). No results found for this or any previous visit (from the past 240 hour(s)). Creatinine: No results found for this basename: CREATININE,  in the last 168 hours  Radiologic Imaging: No results found.  Impression/Assessment:  Malignant right hydronephrosis  Plan:  Cystoscopy, right J2 stent change  Franchot Gallo 09/13/2013, 5:58 AM  [name]. Mychal Durio MD

## 2013-09-13 NOTE — Discharge Instructions (Signed)
1. You may see some blood in the urine and may have some burning with urination for 48-72 hours. You also may notice that you have to urinate more frequently or urgently after your procedure which is normal.  °2. You should call should you develop an inability urinate, fever > 101, persistent nausea and vomiting that prevents you from eating or drinking to stay hydrated.  °3. If you have a stent, you will likely urinate more frequently and urgently until the stent is removed and you may experience some discomfort/pain in the lower abdomen and flank especially when urinating. You may take pain medication prescribed to you if needed for pain. You may also intermittently have blood in the urine until the stent is removed. °4. If you have a catheter, you will be taught how to take care of the catheter by the nursing staff prior to discharge from the hospital.  You may periodically feel a strong urge to void with the catheter in place.  This is a bladder spasm and most often can occur when having a bowel movement or moving around. It is typically self-limited and usually will stop after a few minutes.  You may use some Vaseline or Neosporin around the tip of the catheter to reduce friction at the tip of the penis. You may also see some blood in the urine.  A very small amount of blood can make the urine look quite red.  As long as the catheter is draining well, there usually is not a problem.  However, if the catheter is not draining well and is bloody, you should call the office ([phone]) to notify us. ° ° °Post Anesthesia Home Care Instructions ° °Activity: °Get plenty of rest for the remainder of the day. A responsible adult should stay with you for 24 hours following the procedure.  °For the next 24 hours, DO NOT: °-Drive a car °-Operate machinery °-Drink alcoholic beverages °-Take any medication unless instructed by your physician °-Make any legal decisions or sign important papers. ° °Meals: °Start with liquid  foods such as gelatin or soup. Progress to regular foods as tolerated. Avoid greasy, spicy, heavy foods. If nausea and/or vomiting occur, drink only clear liquids until the nausea and/or vomiting subsides. Call your physician if vomiting continues. ° °Special Instructions/Symptoms: °Your throat may feel dry or sore from the anesthesia or the breathing tube placed in your throat during surgery. If this causes discomfort, gargle with warm salt water. The discomfort should disappear within 24 hours. ° ° °

## 2013-09-13 NOTE — Anesthesia Procedure Notes (Signed)
Procedure Name: LMA Insertion Date/Time: 09/13/2013 11:46 AM Performed by: Bethena Roys T Pre-anesthesia Checklist: Patient identified, Emergency Drugs available, Suction available and Patient being monitored Patient Re-evaluated:Patient Re-evaluated prior to inductionOxygen Delivery Method: Circle System Utilized Preoxygenation: Pre-oxygenation with 100% oxygen Intubation Type: IV induction Ventilation: Mask ventilation without difficulty LMA: LMA inserted LMA Size: 4.0 Number of attempts: 1 Airway Equipment and Method: bite block Placement Confirmation: positive ETCO2 Dental Injury: Teeth and Oropharynx as per pre-operative assessment

## 2013-09-13 NOTE — Op Note (Signed)
PATIENT:  Kirsten Schroeder  PRE-OPERATIVE DIAGNOSIS: Right hydronephrosis, malignant  POST-OPERATIVE DIAGNOSIS: Same  PROCEDURE: Cystoscopy, double-J stent change  SURGEON:  [name]. Cicely Ortner, M.D.  ANESTHESIA:  General  EBL:  Minimal  DRAINS: 6 [place] by 24 cm contour double-J stent without string  LOCAL MEDICATIONS USED:  None  SPECIMEN:  None  INDICATION: Kirsten Schroeder is a 58 year old female with malignant hydronephrosis secondary to recurrent gynecologic cancer. Her initial stent was placed over 6 months ago. She presents at this time for stent change.  Description of procedure: The patient was properly identified and marked (if applicable) in the holding area. They were then  taken to the operating room and placed on the table in a supine position. General anesthesia was then administered. Once fully anesthetized the patient was moved to the dorsolithotomy position and the genitalia and perineum were sterilely prepped and draped in standard fashion. An official timeout was then performed.  A 22 French panendoscope was advanced into her bladder. The double-J stent was identified. There were several encrustations on it. I extracted through the urethra. A guidewire was advanced through the stent. The guidewire was somewhat difficult to navigate through the distal ureter, secondary to mild ureteral narrowing. I felt this point that rather than perform a retrograde I would just go ahead and replace a stent as there was still ureteral narrowing. The stent was totally removed, and over top of the guidewire which was then passed into the right renal pelvis, I placed a 24 cm x 6 French contour stent. Following removal of the guidewire, a good proximal and distal curls were seen. The bladder was drained and the scope removed. The patient was awakened and taken to PACU in stable condition.    PLAN OF CARE: Discharge to home after PACU  PATIENT DISPOSITION:  PACU - hemodynamically stable.

## 2013-09-13 NOTE — Transfer of Care (Signed)
Immediate Anesthesia Transfer of Care Note  Patient: Kirsten Schroeder  Procedure(s) Performed: Procedure(s): CYSTOSCOPY WITH STENT REPLACEMENT (Right)  Patient Location: PACU  Anesthesia Type:General  Level of Consciousness: sedated  Airway & Oxygen Therapy: Patient Spontanous Breathing and Patient connected to nasal cannula oxygen  Post-op Assessment: Report given to PACU RN  Post vital signs: Reviewed and stable  Complications: No apparent anesthesia complications

## 2013-09-13 NOTE — Anesthesia Preprocedure Evaluation (Signed)
Anesthesia Evaluation  Patient identified by MRN, date of birth, ID band Patient awake    Reviewed: Allergy & Precautions, H&P , NPO status , Patient's Chart, lab work & pertinent test results  Airway Mallampati: II TM Distance: >3 FB Neck ROM: full    Dental  (+) Edentulous Upper, Dental Advisory Given   Pulmonary neg pulmonary ROS,  breath sounds clear to auscultation  Pulmonary exam normal       Cardiovascular Exercise Tolerance: Good hypertension, Pt. on medications Rhythm:regular Rate:Normal     Neuro/Psych negative neurological ROS  negative psych ROS   GI/Hepatic Neg liver ROS, PUD,   Endo/Other  negative endocrine ROS  Renal/GU Renal diseasenegative Renal ROS  negative genitourinary   Musculoskeletal   Abdominal   Peds  Hematology negative hematology ROS (+) anemia , hgb 10   Anesthesia Other Findings Metastatic cancer  Reproductive/Obstetrics negative OB ROS                           Anesthesia Physical  Anesthesia Plan  ASA: III  Anesthesia Plan: General   Post-op Pain Management:    Induction: Intravenous  Airway Management Planned: LMA  Additional Equipment:   Intra-op Plan:   Post-operative Plan: Extubation in OR  Informed Consent: I have reviewed the patients History and Physical, chart, labs and discussed the procedure including the risks, benefits and alternatives for the proposed anesthesia with the patient or authorized representative who has indicated his/her understanding and acceptance.   Dental Advisory Given  Plan Discussed with: CRNA and Surgeon  Anesthesia Plan Comments:         Anesthesia Quick Evaluation

## 2013-09-14 ENCOUNTER — Encounter (HOSPITAL_BASED_OUTPATIENT_CLINIC_OR_DEPARTMENT_OTHER): Payer: Self-pay | Admitting: Urology

## 2013-09-14 NOTE — Anesthesia Postprocedure Evaluation (Signed)
Anesthesia Post Note  Patient: Kirsten Schroeder  Procedure(s) Performed: Procedure(s) (LRB): CYSTOSCOPY WITH STENT REPLACEMENT (Right)  Anesthesia type: General  Patient location: PACU  Post pain: Pain level controlled  Post assessment: Post-op Vital signs reviewed  Last Vitals: BP 145/77  Pulse 69  Temp(Src) 36.1 C (Oral)  Resp 16  Wt 147 lb 8 oz (66.906 kg)  SpO2 99%  Post vital signs: Reviewed  Level of consciousness: sedated  Complications: No apparent anesthesia complications

## 2013-09-19 ENCOUNTER — Other Ambulatory Visit (HOSPITAL_BASED_OUTPATIENT_CLINIC_OR_DEPARTMENT_OTHER): Payer: BC Managed Care – PPO

## 2013-09-19 ENCOUNTER — Other Ambulatory Visit: Payer: Self-pay | Admitting: Oncology

## 2013-09-19 ENCOUNTER — Ambulatory Visit (HOSPITAL_BASED_OUTPATIENT_CLINIC_OR_DEPARTMENT_OTHER): Payer: BC Managed Care – PPO

## 2013-09-19 ENCOUNTER — Other Ambulatory Visit: Payer: BC Managed Care – PPO

## 2013-09-19 VITALS — BP 135/80 | HR 73 | Temp 98.1°F | Resp 20

## 2013-09-19 DIAGNOSIS — C778 Secondary and unspecified malignant neoplasm of lymph nodes of multiple regions: Secondary | ICD-10-CM

## 2013-09-19 DIAGNOSIS — C569 Malignant neoplasm of unspecified ovary: Secondary | ICD-10-CM

## 2013-09-19 DIAGNOSIS — Z5111 Encounter for antineoplastic chemotherapy: Secondary | ICD-10-CM

## 2013-09-19 DIAGNOSIS — C801 Malignant (primary) neoplasm, unspecified: Secondary | ICD-10-CM

## 2013-09-19 LAB — CBC WITH DIFFERENTIAL/PLATELET
BASO%: 0.7 % (ref 0.0–2.0)
Basophils Absolute: 0 10*3/uL (ref 0.0–0.1)
EOS ABS: 0.2 10*3/uL (ref 0.0–0.5)
EOS%: 2.9 % (ref 0.0–7.0)
HCT: 36.8 % (ref 34.8–46.6)
HGB: 12.2 g/dL (ref 11.6–15.9)
LYMPH%: 13.7 % — AB (ref 14.0–49.7)
MCH: 33.8 pg (ref 25.1–34.0)
MCHC: 33 g/dL (ref 31.5–36.0)
MCV: 102.5 fL — ABNORMAL HIGH (ref 79.5–101.0)
MONO#: 0.4 10*3/uL (ref 0.1–0.9)
MONO%: 6.5 % (ref 0.0–14.0)
NEUT%: 76.2 % (ref 38.4–76.8)
NEUTROS ABS: 5.1 10*3/uL (ref 1.5–6.5)
PLATELETS: 193 10*3/uL (ref 145–400)
RBC: 3.59 10*6/uL — ABNORMAL LOW (ref 3.70–5.45)
RDW: 15.4 % — ABNORMAL HIGH (ref 11.2–14.5)
WBC: 6.7 10*3/uL (ref 3.9–10.3)
lymph#: 0.9 10*3/uL (ref 0.9–3.3)

## 2013-09-19 LAB — COMPREHENSIVE METABOLIC PANEL (CC13)
ALBUMIN: 3.5 g/dL (ref 3.5–5.0)
ALT: 7 U/L (ref 0–55)
AST: 16 U/L (ref 5–34)
Alkaline Phosphatase: 82 U/L (ref 40–150)
Anion Gap: 9 mEq/L (ref 3–11)
BILIRUBIN TOTAL: 0.45 mg/dL (ref 0.20–1.20)
BUN: 10.1 mg/dL (ref 7.0–26.0)
CALCIUM: 9.9 mg/dL (ref 8.4–10.4)
CO2: 27 mEq/L (ref 22–29)
Chloride: 107 mEq/L (ref 98–109)
Creatinine: 0.9 mg/dL (ref 0.6–1.1)
GLUCOSE: 98 mg/dL (ref 70–140)
POTASSIUM: 4 meq/L (ref 3.5–5.1)
Sodium: 143 mEq/L (ref 136–145)
TOTAL PROTEIN: 6.1 g/dL — AB (ref 6.4–8.3)

## 2013-09-19 MED ORDER — ONDANSETRON 8 MG/NS 50 ML IVPB
INTRAVENOUS | Status: AC
  Filled 2013-09-19: qty 8

## 2013-09-19 MED ORDER — DOXORUBICIN HCL LIPOSOMAL CHEMO INJECTION 2 MG/ML
40.0000 mg/m2 | Freq: Once | INTRAVENOUS | Status: AC
  Administered 2013-09-19: 68 mg via INTRAVENOUS
  Filled 2013-09-19: qty 34

## 2013-09-19 MED ORDER — SODIUM CHLORIDE 0.9 % IJ SOLN
10.0000 mL | INTRAMUSCULAR | Status: DC | PRN
  Administered 2013-09-19: 10 mL
  Filled 2013-09-19: qty 10

## 2013-09-19 MED ORDER — DEXAMETHASONE SODIUM PHOSPHATE 10 MG/ML IJ SOLN
INTRAMUSCULAR | Status: AC
  Filled 2013-09-19: qty 1

## 2013-09-19 MED ORDER — DEXAMETHASONE SODIUM PHOSPHATE 10 MG/ML IJ SOLN
10.0000 mg | Freq: Once | INTRAMUSCULAR | Status: AC
  Administered 2013-09-19: 10 mg via INTRAVENOUS

## 2013-09-19 MED ORDER — ONDANSETRON 8 MG/50ML IVPB (CHCC)
8.0000 mg | Freq: Once | INTRAVENOUS | Status: AC
  Administered 2013-09-19: 8 mg via INTRAVENOUS

## 2013-09-19 MED ORDER — SODIUM CHLORIDE 0.9 % IV SOLN
Freq: Once | INTRAVENOUS | Status: AC
  Administered 2013-09-19: 12:00:00 via INTRAVENOUS

## 2013-09-19 MED ORDER — HEPARIN SOD (PORK) LOCK FLUSH 100 UNIT/ML IV SOLN
500.0000 [IU] | Freq: Once | INTRAVENOUS | Status: AC | PRN
  Administered 2013-09-19: 500 [IU]
  Filled 2013-09-19: qty 5

## 2013-09-19 NOTE — Patient Instructions (Signed)
[facility] Discharge Instructions for Patients Receiving Chemotherapy  Today you received the following chemotherapy agent : Doxil  To help prevent nausea and vomiting after your treatment, we encourage you to take your nausea medications as directed:   Ativan 0.5 mg every 6 hours as needed  Zofran 8-16 mg (1-2 tablets) every 12 hours as needed If you develop nausea and vomiting that is not controlled by your nausea medication, call the clinic.   BELOW ARE SYMPTOMS THAT SHOULD BE REPORTED IMMEDIATELY:  *FEVER GREATER THAN 100.5 F  *CHILLS WITH OR WITHOUT FEVER  NAUSEA AND VOMITING THAT IS NOT CONTROLLED WITH YOUR NAUSEA MEDICATION  *UNUSUAL SHORTNESS OF BREATH  *UNUSUAL BRUISING OR BLEEDING  TENDERNESS IN MOUTH AND THROAT WITH OR WITHOUT PRESENCE OF ULCERS  *URINARY PROBLEMS  *BOWEL PROBLEMS  UNUSUAL RASH Items with * indicate a potential emergency and should be followed up as soon as possible.  Feel free to call the clinic should you have any questions or concerns. The clinic phone number is [phone].  It has been a pleasure to serve you today!

## 2013-09-20 ENCOUNTER — Ambulatory Visit: Payer: BC Managed Care – PPO

## 2013-09-20 ENCOUNTER — Telehealth: Payer: Self-pay | Admitting: *Deleted

## 2013-09-20 LAB — CA 125: CA 125: 573.8 U/mL — AB (ref 0.0–30.2)

## 2013-09-20 NOTE — Telephone Encounter (Signed)
Message copied by Cherylynn Ridges on Thu Sep 20, 2013  4:48 PM ------      Message from: Tania Ade      Created: Wed Sep 19, 2013  4:58 PM      Regarding: Chemo Follow Up Call       1st Doxil on 09/19/13-Dr. Marko Plume            Thanks ------

## 2013-09-20 NOTE — Telephone Encounter (Signed)
Called Kirsten Schroeder for chemotherapy F/U.  Patient is doing well.  Denies n/v.  Denies any new side effects or symptoms.  Bowel and bladder is functioning well.  Eating and drinking well and I instructed to drink 64 oz minimum daily or at least the day before, of and after treatment.  Denies questions at this time and encouraged to call if needed.  Reviewed how to call after hours in the case of an emergency.   

## 2013-09-25 ENCOUNTER — Other Ambulatory Visit: Payer: Self-pay

## 2013-09-25 DIAGNOSIS — N390 Urinary tract infection, site not specified: Secondary | ICD-10-CM

## 2013-09-25 DIAGNOSIS — C772 Secondary and unspecified malignant neoplasm of intra-abdominal lymph nodes: Secondary | ICD-10-CM

## 2013-09-25 MED ORDER — HYDROCODONE-ACETAMINOPHEN 5-325 MG PO TABS: ORAL_TABLET | ORAL | Status: DC

## 2013-09-25 NOTE — Telephone Encounter (Signed)
Prescription with Injection Nurse for patient to pick up Wed. Sep 26, 2013.

## 2013-09-30 ENCOUNTER — Other Ambulatory Visit: Payer: Self-pay | Admitting: Oncology

## 2013-10-03 ENCOUNTER — Other Ambulatory Visit (HOSPITAL_BASED_OUTPATIENT_CLINIC_OR_DEPARTMENT_OTHER): Payer: BC Managed Care – PPO

## 2013-10-03 ENCOUNTER — Telehealth: Payer: Self-pay | Admitting: Oncology

## 2013-10-03 ENCOUNTER — Telehealth: Payer: Self-pay | Admitting: *Deleted

## 2013-10-03 ENCOUNTER — Encounter: Payer: Self-pay | Admitting: Oncology

## 2013-10-03 ENCOUNTER — Ambulatory Visit (HOSPITAL_BASED_OUTPATIENT_CLINIC_OR_DEPARTMENT_OTHER): Payer: BC Managed Care – PPO | Admitting: Oncology

## 2013-10-03 VITALS — BP 120/80 | HR 93 | Temp 99.1°F | Resp 17 | Ht 61.0 in | Wt 140.1 lb

## 2013-10-03 DIAGNOSIS — R5381 Other malaise: Secondary | ICD-10-CM

## 2013-10-03 DIAGNOSIS — R5383 Other fatigue: Secondary | ICD-10-CM

## 2013-10-03 DIAGNOSIS — R109 Unspecified abdominal pain: Secondary | ICD-10-CM

## 2013-10-03 DIAGNOSIS — C569 Malignant neoplasm of unspecified ovary: Secondary | ICD-10-CM

## 2013-10-03 DIAGNOSIS — R21 Rash and other nonspecific skin eruption: Secondary | ICD-10-CM

## 2013-10-03 DIAGNOSIS — N390 Urinary tract infection, site not specified: Secondary | ICD-10-CM

## 2013-10-03 DIAGNOSIS — C801 Malignant (primary) neoplasm, unspecified: Secondary | ICD-10-CM

## 2013-10-03 DIAGNOSIS — M7989 Other specified soft tissue disorders: Secondary | ICD-10-CM

## 2013-10-03 DIAGNOSIS — G609 Hereditary and idiopathic neuropathy, unspecified: Secondary | ICD-10-CM

## 2013-10-03 DIAGNOSIS — E876 Hypokalemia: Secondary | ICD-10-CM

## 2013-10-03 DIAGNOSIS — R634 Abnormal weight loss: Secondary | ICD-10-CM

## 2013-10-03 DIAGNOSIS — B001 Herpesviral vesicular dermatitis: Secondary | ICD-10-CM

## 2013-10-03 LAB — COMPREHENSIVE METABOLIC PANEL (CC13)
ALK PHOS: 59 U/L (ref 40–150)
ALT: 10 U/L (ref 0–55)
AST: 13 U/L (ref 5–34)
Albumin: 2.9 g/dL — ABNORMAL LOW (ref 3.5–5.0)
Anion Gap: 14 mEq/L — ABNORMAL HIGH (ref 3–11)
BUN: 7.2 mg/dL (ref 7.0–26.0)
CALCIUM: 10.2 mg/dL (ref 8.4–10.4)
CHLORIDE: 99 meq/L (ref 98–109)
CO2: 26 mEq/L (ref 22–29)
Creatinine: 0.7 mg/dL (ref 0.6–1.1)
Glucose: 114 mg/dl (ref 70–140)
Potassium: 3.8 mEq/L (ref 3.5–5.1)
Sodium: 139 mEq/L (ref 136–145)
Total Bilirubin: 0.44 mg/dL (ref 0.20–1.20)
Total Protein: 6.6 g/dL (ref 6.4–8.3)

## 2013-10-03 LAB — CBC WITH DIFFERENTIAL/PLATELET
BASO%: 0 % (ref 0.0–2.0)
Basophils Absolute: 0 10*3/uL (ref 0.0–0.1)
EOS%: 0.3 % (ref 0.0–7.0)
Eosinophils Absolute: 0 10*3/uL (ref 0.0–0.5)
HCT: 29.1 % — ABNORMAL LOW (ref 34.8–46.6)
HGB: 9.9 g/dL — ABNORMAL LOW (ref 11.6–15.9)
LYMPH#: 0.5 10*3/uL — AB (ref 0.9–3.3)
LYMPH%: 16.5 % (ref 14.0–49.7)
MCH: 32.8 pg (ref 25.1–34.0)
MCHC: 34 g/dL (ref 31.5–36.0)
MCV: 96.4 fL (ref 79.5–101.0)
MONO#: 0.2 10*3/uL (ref 0.1–0.9)
MONO%: 5.5 % (ref 0.0–14.0)
NEUT#: 2.4 10*3/uL (ref 1.5–6.5)
NEUT%: 77.7 % — ABNORMAL HIGH (ref 38.4–76.8)
Platelets: 98 10*3/uL — ABNORMAL LOW (ref 145–400)
RBC: 3.02 10*6/uL — ABNORMAL LOW (ref 3.70–5.45)
RDW: 13 % (ref 11.2–14.5)
WBC: 3.1 10*3/uL — ABNORMAL LOW (ref 3.9–10.3)

## 2013-10-03 LAB — URINALYSIS, MICROSCOPIC - CHCC
Bilirubin (Urine): NEGATIVE
GLUCOSE UR CHCC: NEGATIVE mg/dL
Ketones: NEGATIVE mg/dL
NITRITE: NEGATIVE
PH: 6 (ref 4.6–8.0)
Protein: 100 mg/dL
SPECIFIC GRAVITY, URINE: 1.01 (ref 1.003–1.035)
Urobilinogen, UR: 0.2 mg/dL (ref 0.2–1)

## 2013-10-03 MED ORDER — ACYCLOVIR 200 MG PO CAPS: 200.0000 mg | ORAL_CAPSULE | Freq: Three times a day (TID) | ORAL | Status: DC | PRN

## 2013-10-03 MED ORDER — CIPROFLOXACIN HCL 250 MG PO TABS: 250.0000 mg | ORAL_TABLET | Freq: Two times a day (BID) | ORAL | Status: DC

## 2013-10-03 NOTE — Telephone Encounter (Signed)
Per staff message and POF I have scheduled appts.  JMW  

## 2013-10-03 NOTE — Telephone Encounter (Signed)
, °

## 2013-10-03 NOTE — Progress Notes (Signed)
OFFICE PROGRESS NOTE   10/03/2013   Physicians:ClarkePearson, Quillian Quince; Evalee Mutton, Marlene Bast, Lorriane Shire; Dahlstedt, Annie Main   INTERVAL HISTORY:   Patient is seen, together with daughter, in continuing attention to advanced papillary serous gyn carcinoma (likely ovarian), which unfortunately did not improve sufficiently with neoadjuvant taxol and carboplatin chemotherapy to allow surgical debulking.  Chemotherapy now changed to doxil, cycle 1 given 09-19-13; she has not had gCSF as yet with the doxil. She has tolerated doxil much better thus far than taxol/carbo, with no significant nausea and no skin reaction.  She is to see Dr Josephina Shih next on 11-02-13. Last imaging was CT AP 08-22-13.  She had right JJ stent exchanged by Dr Diona Fanti on 09-13-13, able to use larger stent this time due to improvement in the tumor involvement. She was more comfortable with new stent initially, with noticeable diuresis, and was on sulfa antibiotic x3 days after new stent. For past 4-5 days, however, she has had right flank pain and has felt fatigued and generally unwell, tho no fever and no chills. This is similar, but not as severe, as previous UTIs.  She has PAC and right JJ stent.  ONCOLOGIC HISTORY Patient presented with RLE swelling negative for DVT July 2014, and right hydronephrosis. Biopsy of left inguinal node ([id number]) found carcinoma consistent with serous gyn primary.Neoadjuvant dose dense taxol carboplatin began 01-31-13, with improvement in pelvic mass and adenopathy by scans after 3 cycles.. She had 3 additional cycles, with progressive decrease in CA 125 from 5626 in 12-2012 to 596 by Jan 2015. CT 06-14-13 had ~ stable pelvic mass and adenopathy. Dr Josephina Shih recommended another 3 cycles chemotherapy, regimen changed to q 3 week taxol carboplatin on 06-27-13, which unfortunately caused worsening of peripheral neuropathy and severe taxol aches.Taxotere was substituted for taxol due to  neuropathy in feet for cycles 8 and 9, completed 08-08-13. CA 125 improved to low of 494 in Feb 2015, then 732 just prior to cycle 9. CT AP 08-22-13 had minimal improvement, still not adequate to consider debulking surgery. Regimen was changed to doxil, cycle 1 on 09-19-13.     Review of systems as above, also: No bleeding. Appetite good until possible UTI symptoms past few days, with taste not as bad as previous chemo. A little less swelling RLE since stent exchange. Bowels moving normally via ostomy. No increased SOB with non-strenuous activity Remainder of 10 point Review of Systems negative.  Objective:  Vital signs in last 24 hours:  BP 120/80  Pulse 93  Temp(Src) 99.1 F (37.3 C) (Oral)  Resp 17  Ht 5\' 1"  (1.549 m)  Wt 140 lb 1.6 oz (63.549 kg)  BMI 26.49 kg/m2  SpO2 97%  Alert, oriented and appropriate. Ambulatory without difficulty.  Hair is beginning to grow back  HEENT:PERRL, sclerae not icteric. Oral mucosa moist without lesions, posterior pharynx clear.  Neck supple. No JVD.  Lymphatics:no cervical,suraclavicular adenopathy Resp: clear to auscultation bilaterally and normal percussion bilaterally Cardio: regular rate and rhythm. No gallop. GI: soft, nontender, not distended, no mass or organomegaly. Normal bowel sounds. Surgical incision not remarkable. Ostomy. Musculoskeletal/ Extremities: RLE 2+ swelling lower leg, no cords or tenderness; LLE without pitting edema, cords, tenderness Neuro: no change peripheral neuropathy. Otherwise nonfocal Skin without rash, ecchymosis, petechiae Portacath-without erythema or tenderness  Lab Results:  Results for orders placed in visit on 10/03/13  CBC WITH DIFFERENTIAL      Result Value Ref Range   WBC 3.1 (*) 3.9 - 10.3 10e3/uL  NEUT# 2.4  1.5 - 6.5 10e3/uL   HGB 9.9 (*) 11.6 - 15.9 g/dL   HCT 29.1 (*) 34.8 - 46.6 %   Platelets 98 (*) 145 - 400 10e3/uL   MCV 96.4  79.5 - 101.0 fL   MCH 32.8  25.1 - 34.0 pg   MCHC 34.0   31.5 - 36.0 g/dL   RBC 3.02 (*) 3.70 - 5.45 10e6/uL   RDW 13.0  11.2 - 14.5 %   lymph# 0.5 (*) 0.9 - 3.3 10e3/uL   MONO# 0.2  0.1 - 0.9 10e3/uL   Eosinophils Absolute 0.0  0.0 - 0.5 10e3/uL   Basophils Absolute 0.0  0.0 - 0.1 10e3/uL   NEUT% 77.7 (*) 38.4 - 76.8 %   LYMPH% 16.5  14.0 - 49.7 %   MONO% 5.5  0.0 - 14.0 %   EOS% 0.3  0.0 - 7.0 %   BASO% 0.0  0.0 - 2.0 %  COMPREHENSIVE METABOLIC PANEL (WU98)      Result Value Ref Range   Sodium 139  136 - 145 mEq/L   Potassium 3.8  3.5 - 5.1 mEq/L   Chloride 99  98 - 109 mEq/L   CO2 26  22 - 29 mEq/L   Glucose 114  70 - 140 mg/dl   BUN 7.2  7.0 - 26.0 mg/dL   Creatinine 0.7  0.6 - 1.1 mg/dL   Total Bilirubin 0.44  0.20 - 1.20 mg/dL   Alkaline Phosphatase 59  40 - 150 U/L   AST 13  5 - 34 U/L   ALT 10  0 - 55 U/L   Total Protein 6.6  6.4 - 8.3 g/dL   Albumin 2.9 (*) 3.5 - 5.0 g/dL   Calcium 10.2  8.4 - 10.4 mg/dL   Anion Gap 14 (*) 3 - 11 mEq/L  URINALYSIS, MICROSCOPIC - [facility]      Result Value Ref Range   Glucose Negative  Negative mg/dL   Bilirubin (Urine) Negative  Negative   Ketones Negative  Negative mg/dL   Specific Gravity, Urine 1.010  1.003 - 1.035   Blood Large  Negative   pH 6.0  4.6 - 8.0   Protein 100  Negative- <30 mg/dL   Urobilinogen, UR 0.2  0.2 - 1 mg/dL   Nitrite Negative  Negative   Leukocyte Esterase Large  Negative   RBC / HPF TNTC  0 - 2   WBC, UA TNTC  0 - 2   Bacteria, UA Many  Negative- Trace   Epithelial Cells Occasional  Negative- Few   Yeast, UA Moderate  Negative   Urine culture sent  Studies/Results:  No results found.  Medications: I have reviewed the patient's current medications. Begin cipro 250 mg bid until results of culture available. With finding of yeast also in urine, will add diflucan now. Continue po iron.  DISCUSSION: Evaluation and coverage for probable UTI as above. Will need follow up culture to be sure cleared if infection is documented. Will check CBC (with CMET and ca125)  next week; she is aware that nadir for doxil can be 3-4 weeks. If counts ok, will be due cycle 2 on 10-17-13.  Assessment/Plan:  1. serous gyn carcinoma: large pelvic mass with right ureteral obstruction, and small bowel fistula into the mass Aug 2014: clinical improvement with carbo/taxane chemo thus far, tho some increase in CA 125 with 9th cycle. She appears to have reached maximum benefit from these agents and is not surgical candidate.  Will change regimen to doxil beginning 09-19-13. Will need to be aware of ileostomy dressings with the doxil skin sensitivity, may need to discuss with ostomy RN.  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 PAC in  7.probable UTI: culture pending, empiric cipro. WIll add diflucan due to yeast also in urine. 8.rash to PCN  9. Chemo related cytopenias: repeat CBC next week. No gCSF with doxil to date. 10. Extremely sensitive to oral contrast with CTs causing diarrhea, needs water based oral contrast regimen in radiology. Hypokalemia: related to ileostomy output, generally manages with diet.  11.RLE swelling: no DVT by prior evaluations  12.weight loss: dietician aware, appetite better with improvement in taste.       Gordy Levan, MD   10/03/2013, 11:07 AM

## 2013-10-05 ENCOUNTER — Telehealth: Payer: Self-pay | Admitting: *Deleted

## 2013-10-05 ENCOUNTER — Other Ambulatory Visit: Payer: Self-pay | Admitting: *Deleted

## 2013-10-05 DIAGNOSIS — N390 Urinary tract infection, site not specified: Secondary | ICD-10-CM

## 2013-10-05 DIAGNOSIS — B952 Enterococcus as the cause of diseases classified elsewhere: Secondary | ICD-10-CM

## 2013-10-05 DIAGNOSIS — C569 Malignant neoplasm of unspecified ovary: Secondary | ICD-10-CM

## 2013-10-05 LAB — URINE CULTURE

## 2013-10-05 MED ORDER — FLUCONAZOLE 100 MG PO TABS: 100.0000 mg | ORAL_TABLET | Freq: Every day | ORAL | Status: DC

## 2013-10-05 MED ORDER — NITROFURANTOIN MONOHYD MACRO 100 MG PO CAPS: 100.0000 mg | ORAL_CAPSULE | Freq: Two times a day (BID) | ORAL | Status: DC

## 2013-10-05 NOTE — Telephone Encounter (Signed)
Patient notified of MD order to change antibiotic due to sensitivity results and she also needs diflucan for yeast in her urine. Patient understands and agrees. Reminded her also of lab appointment on 10/10/13.

## 2013-10-05 NOTE — Telephone Encounter (Signed)
Message copied by Tania Ade on Fri Oct 05, 2013  3:12 PM ------      Message from: Gordy Levan      Created: Fri Oct 05, 2013  3:02 PM      Regarding: RE: Urine Culture       Please change cipro to macrobid 100 mg twice daily #14      Yes, please do diflucan also      thanks                  ----- Message -----         From: Tania Ade, RN         Sent: 10/05/2013   2:21 PM           To: Lennis Marion Downer, MD      Subject: Urine Culture                                            Urine culture final with >100,000 colonies enterococcus. I'm not skilled at interpretation of sensitivity.      She is on cipro. Do you still want me to call in Fluconazole 100mg /day X 7 days?            Thanks,      Merceda Elks, Triage       ------

## 2013-10-10 ENCOUNTER — Other Ambulatory Visit (HOSPITAL_BASED_OUTPATIENT_CLINIC_OR_DEPARTMENT_OTHER): Payer: BC Managed Care – PPO

## 2013-10-10 DIAGNOSIS — C569 Malignant neoplasm of unspecified ovary: Secondary | ICD-10-CM

## 2013-10-10 DIAGNOSIS — R5381 Other malaise: Secondary | ICD-10-CM

## 2013-10-10 DIAGNOSIS — R5383 Other fatigue: Secondary | ICD-10-CM

## 2013-10-10 DIAGNOSIS — C801 Malignant (primary) neoplasm, unspecified: Secondary | ICD-10-CM

## 2013-10-10 LAB — COMPREHENSIVE METABOLIC PANEL (CC13)
ALBUMIN: 3 g/dL — AB (ref 3.5–5.0)
ALT: 9 U/L (ref 0–55)
ANION GAP: 16 meq/L — AB (ref 3–11)
AST: 13 U/L (ref 5–34)
Alkaline Phosphatase: 56 U/L (ref 40–150)
BUN: 6.7 mg/dL — ABNORMAL LOW (ref 7.0–26.0)
CHLORIDE: 98 meq/L (ref 98–109)
CO2: 28 meq/L (ref 22–29)
Calcium: 10.1 mg/dL (ref 8.4–10.4)
Creatinine: 0.7 mg/dL (ref 0.6–1.1)
GLUCOSE: 118 mg/dL (ref 70–140)
POTASSIUM: 3.5 meq/L (ref 3.5–5.1)
SODIUM: 141 meq/L (ref 136–145)
TOTAL PROTEIN: 6.9 g/dL (ref 6.4–8.3)
Total Bilirubin: 0.45 mg/dL (ref 0.20–1.20)

## 2013-10-10 LAB — CBC WITH DIFFERENTIAL/PLATELET
BASO%: 0.7 % (ref 0.0–2.0)
Basophils Absolute: 0 10*3/uL (ref 0.0–0.1)
EOS ABS: 0 10*3/uL (ref 0.0–0.5)
EOS%: 0.5 % (ref 0.0–7.0)
HCT: 29.7 % — ABNORMAL LOW (ref 34.8–46.6)
HGB: 10 g/dL — ABNORMAL LOW (ref 11.6–15.9)
LYMPH#: 0.4 10*3/uL — AB (ref 0.9–3.3)
LYMPH%: 13.1 % — ABNORMAL LOW (ref 14.0–49.7)
MCH: 33 pg (ref 25.1–34.0)
MCHC: 33.7 g/dL (ref 31.5–36.0)
MCV: 97.8 fL (ref 79.5–101.0)
MONO#: 0.5 10*3/uL (ref 0.1–0.9)
MONO%: 16.2 % — ABNORMAL HIGH (ref 0.0–14.0)
NEUT%: 69.5 % (ref 38.4–76.8)
NEUTROS ABS: 2 10*3/uL (ref 1.5–6.5)
Platelets: 281 10*3/uL (ref 145–400)
RBC: 3.04 10*6/uL — AB (ref 3.70–5.45)
RDW: 13.9 % (ref 11.2–14.5)
WBC: 2.9 10*3/uL — AB (ref 3.9–10.3)

## 2013-10-10 LAB — CA 125: CA 125: 925.1 U/mL — AB (ref 0.0–30.2)

## 2013-10-16 ENCOUNTER — Other Ambulatory Visit: Payer: Self-pay | Admitting: *Deleted

## 2013-10-16 DIAGNOSIS — C569 Malignant neoplasm of unspecified ovary: Secondary | ICD-10-CM

## 2013-10-17 ENCOUNTER — Other Ambulatory Visit (HOSPITAL_BASED_OUTPATIENT_CLINIC_OR_DEPARTMENT_OTHER): Payer: BC Managed Care – PPO

## 2013-10-17 ENCOUNTER — Ambulatory Visit (HOSPITAL_BASED_OUTPATIENT_CLINIC_OR_DEPARTMENT_OTHER): Payer: BC Managed Care – PPO

## 2013-10-17 VITALS — BP 120/63 | HR 72 | Temp 98.4°F | Resp 18

## 2013-10-17 DIAGNOSIS — C801 Malignant (primary) neoplasm, unspecified: Secondary | ICD-10-CM

## 2013-10-17 DIAGNOSIS — C569 Malignant neoplasm of unspecified ovary: Secondary | ICD-10-CM

## 2013-10-17 DIAGNOSIS — C778 Secondary and unspecified malignant neoplasm of lymph nodes of multiple regions: Secondary | ICD-10-CM

## 2013-10-17 DIAGNOSIS — Z5111 Encounter for antineoplastic chemotherapy: Secondary | ICD-10-CM

## 2013-10-17 LAB — COMPREHENSIVE METABOLIC PANEL (CC13)
ALK PHOS: 57 U/L (ref 40–150)
ALT: 9 U/L (ref 0–55)
AST: 15 U/L (ref 5–34)
Albumin: 3 g/dL — ABNORMAL LOW (ref 3.5–5.0)
Anion Gap: 11 mEq/L (ref 3–11)
BILIRUBIN TOTAL: 0.21 mg/dL (ref 0.20–1.20)
BUN: 7.8 mg/dL (ref 7.0–26.0)
CO2: 29 mEq/L (ref 22–29)
Calcium: 10.4 mg/dL (ref 8.4–10.4)
Chloride: 103 mEq/L (ref 98–109)
Creatinine: 0.7 mg/dL (ref 0.6–1.1)
Glucose: 97 mg/dl (ref 70–140)
Potassium: 3.9 mEq/L (ref 3.5–5.1)
Sodium: 143 mEq/L (ref 136–145)
TOTAL PROTEIN: 6.5 g/dL (ref 6.4–8.3)

## 2013-10-17 LAB — CBC WITH DIFFERENTIAL/PLATELET
BASO%: 0.9 % (ref 0.0–2.0)
Basophils Absolute: 0 10*3/uL (ref 0.0–0.1)
EOS%: 0.6 % (ref 0.0–7.0)
Eosinophils Absolute: 0 10*3/uL (ref 0.0–0.5)
HCT: 31.3 % — ABNORMAL LOW (ref 34.8–46.6)
HGB: 10.1 g/dL — ABNORMAL LOW (ref 11.6–15.9)
LYMPH%: 23.3 % (ref 14.0–49.7)
MCH: 31.7 pg (ref 25.1–34.0)
MCHC: 32.3 g/dL (ref 31.5–36.0)
MCV: 98.1 fL (ref 79.5–101.0)
MONO#: 0.5 10*3/uL (ref 0.1–0.9)
MONO%: 14.9 % — AB (ref 0.0–14.0)
NEUT#: 2 10*3/uL (ref 1.5–6.5)
NEUT%: 60.3 % (ref 38.4–76.8)
PLATELETS: 325 10*3/uL (ref 145–400)
RBC: 3.19 10*6/uL — AB (ref 3.70–5.45)
RDW: 13.5 % (ref 11.2–14.5)
WBC: 3.4 10*3/uL — AB (ref 3.9–10.3)
lymph#: 0.8 10*3/uL — ABNORMAL LOW (ref 0.9–3.3)

## 2013-10-17 MED ORDER — DEXAMETHASONE SODIUM PHOSPHATE 10 MG/ML IJ SOLN
10.0000 mg | Freq: Once | INTRAMUSCULAR | Status: AC
  Administered 2013-10-17: 10 mg via INTRAVENOUS

## 2013-10-17 MED ORDER — HEPARIN SOD (PORK) LOCK FLUSH 100 UNIT/ML IV SOLN
500.0000 [IU] | Freq: Once | INTRAVENOUS | Status: AC | PRN
  Administered 2013-10-17: 500 [IU]
  Filled 2013-10-17: qty 5

## 2013-10-17 MED ORDER — DOXORUBICIN HCL LIPOSOMAL CHEMO INJECTION 2 MG/ML
40.0000 mg/m2 | Freq: Once | INTRAVENOUS | Status: AC
  Administered 2013-10-17: 68 mg via INTRAVENOUS
  Filled 2013-10-17: qty 34

## 2013-10-17 MED ORDER — ONDANSETRON 8 MG/50ML IVPB (CHCC)
8.0000 mg | Freq: Once | INTRAVENOUS | Status: AC
  Administered 2013-10-17: 8 mg via INTRAVENOUS

## 2013-10-17 MED ORDER — SODIUM CHLORIDE 0.9 % IV SOLN
Freq: Once | INTRAVENOUS | Status: AC
  Administered 2013-10-17: 10:00:00 via INTRAVENOUS

## 2013-10-17 MED ORDER — SODIUM CHLORIDE 0.9 % IJ SOLN
10.0000 mL | INTRAMUSCULAR | Status: DC | PRN
  Administered 2013-10-17: 10 mL
  Filled 2013-10-17: qty 10

## 2013-10-17 MED ORDER — ONDANSETRON 8 MG/NS 50 ML IVPB
INTRAVENOUS | Status: AC
  Filled 2013-10-17: qty 8

## 2013-10-17 MED ORDER — DEXAMETHASONE SODIUM PHOSPHATE 10 MG/ML IJ SOLN
INTRAMUSCULAR | Status: AC
  Filled 2013-10-17: qty 1

## 2013-10-17 NOTE — Patient Instructions (Signed)
[facility] Discharge Instructions for Patients Receiving Chemotherapy  Today you received the following chemotherapy agents Doxil.  To help prevent nausea and vomiting after your treatment, we encourage you to take your nausea medication as prescribed.   If you develop nausea and vomiting that is not controlled by your nausea medication, call the clinic.   BELOW ARE SYMPTOMS THAT SHOULD BE REPORTED IMMEDIATELY:  *FEVER GREATER THAN 100.5 F  *CHILLS WITH OR WITHOUT FEVER  NAUSEA AND VOMITING THAT IS NOT CONTROLLED WITH YOUR NAUSEA MEDICATION  *UNUSUAL SHORTNESS OF BREATH  *UNUSUAL BRUISING OR BLEEDING  TENDERNESS IN MOUTH AND THROAT WITH OR WITHOUT PRESENCE OF ULCERS  *URINARY PROBLEMS  *BOWEL PROBLEMS  UNUSUAL RASH Items with * indicate a potential emergency and should be followed up as soon as possible.  Feel free to call the clinic you have any questions or concerns. The clinic phone number is [phone].    

## 2013-10-17 NOTE — Progress Notes (Signed)
Dr. Marko Plume reviewed all lab results including CA 125 results done 10/10/13.  Proceed with chemo as ordered.  MD will monitor CA 125 as indicated.

## 2013-10-28 ENCOUNTER — Other Ambulatory Visit: Payer: Self-pay | Admitting: Oncology

## 2013-10-28 DIAGNOSIS — C569 Malignant neoplasm of unspecified ovary: Secondary | ICD-10-CM

## 2013-10-31 ENCOUNTER — Encounter: Payer: Self-pay | Admitting: Oncology

## 2013-10-31 ENCOUNTER — Other Ambulatory Visit (HOSPITAL_BASED_OUTPATIENT_CLINIC_OR_DEPARTMENT_OTHER): Payer: BC Managed Care – PPO

## 2013-10-31 ENCOUNTER — Ambulatory Visit (HOSPITAL_BASED_OUTPATIENT_CLINIC_OR_DEPARTMENT_OTHER): Payer: BC Managed Care – PPO | Admitting: Oncology

## 2013-10-31 VITALS — BP 139/82 | HR 82 | Temp 98.1°F | Resp 17 | Ht 61.0 in | Wt 143.7 lb

## 2013-10-31 DIAGNOSIS — R634 Abnormal weight loss: Secondary | ICD-10-CM

## 2013-10-31 DIAGNOSIS — M7989 Other specified soft tissue disorders: Secondary | ICD-10-CM

## 2013-10-31 DIAGNOSIS — C569 Malignant neoplasm of unspecified ovary: Secondary | ICD-10-CM

## 2013-10-31 DIAGNOSIS — G609 Hereditary and idiopathic neuropathy, unspecified: Secondary | ICD-10-CM

## 2013-10-31 DIAGNOSIS — R21 Rash and other nonspecific skin eruption: Secondary | ICD-10-CM

## 2013-10-31 DIAGNOSIS — C801 Malignant (primary) neoplasm, unspecified: Secondary | ICD-10-CM

## 2013-10-31 DIAGNOSIS — E876 Hypokalemia: Secondary | ICD-10-CM

## 2013-10-31 DIAGNOSIS — L270 Generalized skin eruption due to drugs and medicaments taken internally: Secondary | ICD-10-CM

## 2013-10-31 LAB — COMPREHENSIVE METABOLIC PANEL (CC13)
ALBUMIN: 3.1 g/dL — AB (ref 3.5–5.0)
ALT: 10 U/L (ref 0–55)
ANION GAP: 9 meq/L (ref 3–11)
AST: 15 U/L (ref 5–34)
Alkaline Phosphatase: 56 U/L (ref 40–150)
BUN: 7 mg/dL (ref 7.0–26.0)
CO2: 30 meq/L — AB (ref 22–29)
Calcium: 9.8 mg/dL (ref 8.4–10.4)
Chloride: 104 mEq/L (ref 98–109)
Creatinine: 0.7 mg/dL (ref 0.6–1.1)
GLUCOSE: 103 mg/dL (ref 70–140)
POTASSIUM: 3.6 meq/L (ref 3.5–5.1)
Sodium: 143 mEq/L (ref 136–145)
Total Bilirubin: 0.48 mg/dL (ref 0.20–1.20)
Total Protein: 6.4 g/dL (ref 6.4–8.3)

## 2013-10-31 LAB — CBC WITH DIFFERENTIAL/PLATELET
BASO%: 0.8 % (ref 0.0–2.0)
BASOS ABS: 0 10*3/uL (ref 0.0–0.1)
EOS ABS: 0 10*3/uL (ref 0.0–0.5)
EOS%: 1.1 % (ref 0.0–7.0)
HCT: 32.5 % — ABNORMAL LOW (ref 34.8–46.6)
HGB: 10.9 g/dL — ABNORMAL LOW (ref 11.6–15.9)
LYMPH%: 15.1 % (ref 14.0–49.7)
MCH: 32.9 pg (ref 25.1–34.0)
MCHC: 33.5 g/dL (ref 31.5–36.0)
MCV: 98.1 fL (ref 79.5–101.0)
MONO#: 0.3 10*3/uL (ref 0.1–0.9)
MONO%: 7.1 % (ref 0.0–14.0)
NEUT%: 75.9 % (ref 38.4–76.8)
NEUTROS ABS: 3 10*3/uL (ref 1.5–6.5)
PLATELETS: 180 10*3/uL (ref 145–400)
RBC: 3.31 10*6/uL — ABNORMAL LOW (ref 3.70–5.45)
RDW: 15.9 % — ABNORMAL HIGH (ref 11.2–14.5)
WBC: 3.9 10*3/uL (ref 3.9–10.3)
lymph#: 0.6 10*3/uL — ABNORMAL LOW (ref 0.9–3.3)

## 2013-10-31 LAB — CA 125: CA 125: 945.6 U/mL — AB (ref 0.0–30.2)

## 2013-10-31 MED ORDER — VITAMIN B-6 50 MG PO TABS: ORAL_TABLET | ORAL | Status: DC

## 2013-10-31 NOTE — Progress Notes (Signed)
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 She is to see Dr Ardelle Lesches again on 11-02-13. Ca125 is pending today, this having been 573 as baseline for first doxil on 09-19-13 and was 925 prior to cycle 2.  Patient felt well first week after treatment and is feeling well again now, tho the second week after chemo is more difficult, with increased nausea, poor appetite and more fatigue. She has had no symptoms of UTI since last treatment mid May, is to see Dr Diona Fanti next month in follow up of the right JJ stent. She has some minimal erythema of hands, no other skin irritation noticeable; we will start B6 supplement.   She has PAC and right JJ stent.  Case also discussed during visit with [facility] pharmacist.   ONCOLOGIC HISTORY  Patient presented with RLE swelling negative for DVT July 2014, and right hydronephrosis. Biopsy of left inguinal node ([id number]) found carcinoma consistent with serous gyn primary.Neoadjuvant dose dense taxol carboplatin began 01-31-13, with improvement in pelvic mass and adenopathy by scans after 3 cycles.. She had 3 additional cycles, with progressive decrease in CA 125 from 5626 in 12-2012 to 596 by Jan 2015. CT 06-14-13 had ~ stable pelvic mass and adenopathy. Dr Josephina Shih recommended another 3 cycles chemotherapy, regimen changed to q 3 week taxol carboplatin on 06-27-13, which unfortunately caused worsening of peripheral neuropathy and severe taxol aches.Taxotere was substituted for taxol due to neuropathy in feet for cycles 8 and 9, completed 08-08-13. CA 125 improved to low of 494 in Feb 2015, then 732 just prior to cycle 9. CT AP 08-22-13 had minimal improvement, still  not adequate to consider debulking surgery. Regimen was changed to doxil, cycle 1 on 09-19-13.    Review of systems as above, also: BP no higher than 120 still off lisinopril / HCTZ. Neuropathy from previous taxol improved, now numbness in tips of toes only. Ostomy output at baseline. No bleeding. No cough or increased SOB. No new or different abdominal or pelvic pain. No symptoms of UTI or other infection including no fever.  Remainder of 10 point Review of Systems negative.  Objective:  Vital signs in last 24 hours:  BP 139/82  Pulse 82  Temp(Src) 98.1 F (36.7 C) (Oral)  Resp 17  Ht 5\' 1"  (1.549 m)  Wt 143 lb 11.2 oz (65.182 kg)  BMI 27.17 kg/m2  SpO2 97% Weight is up 3 lbs. Alert, oriented and appropriate. Ambulatory without difficulty.  Hair is starting to grow back.  HEENT:PERRL, sclerae not icteric. Oral mucosa moist without lesions, posterior pharynx clear.  Neck supple. No JVD.  Lymphatics:no cervical,suraclavicular, axillary or inguinal adenopathy Resp: clear to auscultation bilaterally and normal percussion bilaterally Cardio: regular rate and rhythm. No gallop. GI: soft, nontender, not distended, no mass or organomegaly. Normally active bowel sounds.Ostomy site not remarkable. Musculoskeletal/ Extremities: RLE with tight swelling lower leg and swelling also noticeable lateral right thigh, without cords or tenderness; this swelling slightly inceased from my last exam. LLE and UE without pitting edema, cords, tenderness Neuro: minimal peripheral neuropathy toes now. Otherwise nonfocal. PSYCH: mood and affect appropriate. Skin: palms with slight erythema and slight puffiness, otherwise without rash, ecchymosis, petechiae Portacath-without erythema or tenderness  Lab Results:  Results for orders  placed in visit on 10/31/13  CBC WITH DIFFERENTIAL      Result Value Ref Range   WBC 3.9  3.9 - 10.3 10e3/uL   NEUT# 3.0  1.5 - 6.5 10e3/uL   HGB 10.9 (*) 11.6 - 15.9 g/dL    HCT 32.5 (*) 34.8 - 46.6 %   Platelets 180  145 - 400 10e3/uL   MCV 98.1  79.5 - 101.0 fL   MCH 32.9  25.1 - 34.0 pg   MCHC 33.5  31.5 - 36.0 g/dL   RBC 3.31 (*) 3.70 - 5.45 10e6/uL   RDW 15.9 (*) 11.2 - 14.5 %   lymph# 0.6 (*) 0.9 - 3.3 10e3/uL   MONO# 0.3  0.1 - 0.9 10e3/uL   Eosinophils Absolute 0.0  0.0 - 0.5 10e3/uL   Basophils Absolute 0.0  0.0 - 0.1 10e3/uL   NEUT% 75.9  38.4 - 76.8 %   LYMPH% 15.1  14.0 - 49.7 %   MONO% 7.1  0.0 - 14.0 %   EOS% 1.1  0.0 - 7.0 %   BASO% 0.8  0.0 - 2.0 %    CA 125 available after visit essentially stable at 945, this having been 925 just prior to cycle 2 doxil.  Studies/Results:  No results found. Last CT AP was 08-22-13  Medications: I have reviewed the patient's current medications. Will add Vit B6 100 mg AM and 50 mg PM for hand/ foot syndrome. She will try OTC zantac or pepcid at least week 2 of next cycle if gastritis symptoms noticeable, and we can change this to prescription strength if needed. Fine to use zofran daily during the second week after doxil. She remains off antihypertensive, BPs 161W systolic and does not need diuretic.   DISCUSSION:we will let her know CA125 results as above; hopefully leveling off of the marker now is early indication of response to present doxil. We discussed medication changes and additions as above.  Assessment/Plan:  1. 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 Doxil begun 09-19-13, tolerating adequately, cycle 3 planned 11-14-13 as long as Leal >=1.5 and plt >=100k. She will see Dr Josephina Shih later this week. I will see her again ~ 2 weeks after # 3 Doxil. Add B6 to try to lessen symptoms of hand foot syndrome, as she has slight changes in palms now. 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 PAC in  7.enterococcus UTI + yeast 5-15, resolved with nitrofurantoin and diflucan 8.rash to PCN  9. Chemo related cytopenias:  No gCSF with doxil to date.  10. Extremely sensitive to oral contrast with CTs causing diarrhea, needs water based oral contrast regimen in radiology. Hypokalemia: related to ileostomy output, generally manages with diet.  11.RLE swelling: no DVT by prior evaluations  12.weight loss: some improvement, dietician aware, appetite better with improvement in taste.   Patient and daughter understand all of discussion and are in agreement with plan.     Vonnetta Akey P, MD   10/31/2013, 9:26 AM

## 2013-11-01 ENCOUNTER — Telehealth: Payer: Self-pay | Admitting: *Deleted

## 2013-11-01 NOTE — Telephone Encounter (Signed)
Pt notified of message below.

## 2013-11-01 NOTE — Telephone Encounter (Signed)
Message copied by Sharlynn Oliphant A on Thu Nov 01, 2013 11:00 AM ------      Message from: Gordy Levan      Created: Thu Nov 01, 2013 10:26 AM       Labs seen and need follow up: please let her know the ca125 marker is basically unchanged as of yesterday, which hopefully means we are starting to see some response to this new chemo, as marker can level out before it starts to drop ------

## 2013-11-02 ENCOUNTER — Ambulatory Visit: Payer: BC Managed Care – PPO | Attending: Gynecology | Admitting: Gynecology

## 2013-11-02 ENCOUNTER — Encounter: Payer: Self-pay | Admitting: Gynecology

## 2013-11-02 VITALS — BP 131/72 | HR 99 | Temp 98.7°F | Resp 99 | Ht 61.0 in | Wt 141.6 lb

## 2013-11-02 DIAGNOSIS — I10 Essential (primary) hypertension: Secondary | ICD-10-CM | POA: Insufficient documentation

## 2013-11-02 DIAGNOSIS — C801 Malignant (primary) neoplasm, unspecified: Secondary | ICD-10-CM

## 2013-11-02 DIAGNOSIS — C7951 Secondary malignant neoplasm of bone: Secondary | ICD-10-CM | POA: Insufficient documentation

## 2013-11-02 DIAGNOSIS — Z9049 Acquired absence of other specified parts of digestive tract: Secondary | ICD-10-CM

## 2013-11-02 DIAGNOSIS — IMO0002 Reserved for concepts with insufficient information to code with codable children: Secondary | ICD-10-CM

## 2013-11-02 DIAGNOSIS — C569 Malignant neoplasm of unspecified ovary: Secondary | ICD-10-CM | POA: Insufficient documentation

## 2013-11-02 DIAGNOSIS — C7952 Secondary malignant neoplasm of bone marrow: Secondary | ICD-10-CM

## 2013-11-02 DIAGNOSIS — C772 Secondary and unspecified malignant neoplasm of intra-abdominal lymph nodes: Secondary | ICD-10-CM

## 2013-11-02 DIAGNOSIS — Z79899 Other long term (current) drug therapy: Secondary | ICD-10-CM | POA: Insufficient documentation

## 2013-11-02 NOTE — Progress Notes (Signed)
Consult Note: Gyn-Onc   Kirsten Schroeder 58 y.o. female  Chief Complaint  Patient presents with  . 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 The patient is currently receiving Doxil. Clinically she is doing very well.. Plan:  The current situation was reviewed with the patient and her son. She will continue receiving Doxil. The next dose is scheduled for 11/14/2013. Precautions regarding preventing PPE were reviewed. The patient will return to see me at the discretion of Dr. Marko Plume Interval History: The patient returns for followup . After our last visit, the patient was started on Doxil received her first dose on 09/19/2013. At that time the CA 125 value was 573 units per mL. Patient received her second cycle on May 27. At that time CA 125 has risen to 925 units per mL. However on June 10 a repeat CA 125 is relatively stable (945 units per mL).  Overall the patient feels very well. She denies any abdominal pain or pressure. She did have ureteral stent replaced recently. She is tolerating the Doxil well and has had no difficulty with PPE. She has no GI or GU symptoms.  History of present illness:.The patient was initially seen in consultation at the request of Dr Julieanne Manson regarding a newly diagnosed pelvic mass. The patient was initially admitted for evaluation of hydronephrosis and a UTI. Further evaluation revealed a pelvic mass, and retroperitoneal adenopathy. From a gynecologic point of view the patient denies any past gynecologic history. She said she had a pelvic examination in June which is relatively normal. She indicates that her gynecologist had been concern that because of her total colectomy the uterus is tilted posteriorly. Patient denies any pelvic pain. She went through menopause at about age 19 and has not had any bleeding.   Given the extent of disease we initiated dose dense carboplatin and Taxol in a neoadjuvant  fashion. Overall she received 9 cycles of carboplatin and Taxol. And initiation of therapy her CA 125 was 5626 and fell to a low value of 494 units per mL on July 09 2013. However, on 08/06/2013 CA 125 rose to 732 units per mL. CT scan at that time showed stable disease over the prior 3 months.  In April 2015 the patient's regimen was changed to Doxil.   Review of Systems:10 point review of systems is negative except as noted in interval history.   Vitals: Blood pressure 131/72, pulse 99, temperature 98.7 F (37.1 C), temperature source Oral, resp. rate 99, height 5\' 1"  (1.549 m), weight 141 lb 9.6 oz (64.229 kg).  Physical Exam: General : The patient is a healthy woman in no acute distress.  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 This is easily reducible. The suprapubic mass seems to have resolved. There is no evidence of ascites.  Pelvic:   Deferred today. At the patient's request.   Allergies  Allergen Reactions  . Penicillins Rash    Past Medical History  Diagnosis Date  . Ulcerative colitis   . Hydronephrosis, right   . Ejection fraction < 50%     56%  AND NORMAL WALL MOTION --  PER  MUGA  09-06-2013  . 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 Wears glasses   . Hypertension  PT CURRENT NOT TAKING BP MEDICATION PER PCP SINCE BP IS LOW SECONDARY TO CHEMO  . 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 Bone metastasis     MILD ---  SECONDARY OVARIAN CANCER    Past Surgical History  Procedure Laterality Date  . Cystoscopy w/ ureteral stent placement Right 01/12/2013    Procedure: CYSTOSCOPY WITH RETROGRADE PYELOGRAM/ RIGHT DOUBLE J STENT PLACEMENT;  Surgeon: Franchot Gallo,  MD;  Location: [facility];  Service: Urology;  Laterality: Right;  . Cesarean section    . Cholecystectomy  1992  . Total colectomy w/ permanent ileostomy  1990  (2 PART SURGERY)    SEVERE UC  . Cystoscopy w/ ureteral stent placement Right 09/13/2013    Procedure: CYSTOSCOPY WITH STENT REPLACEMENT;  Surgeon: Franchot Gallo, MD;  Location: [facility];  Service: Urology;  Laterality: Right;    Current Outpatient Prescriptions  Medication Sig Dispense Refill  . acyclovir (ZOVIRAX) 200 MG capsule Take 1 capsule (200 mg total) by mouth 3 (three) times daily as needed. For fever blister  30 capsule  1  . Calcium Carbonate-Vitamin D (CALCIUM + D PO) Take 1 tablet by mouth daily.       . Fe Fum-FA-B Cmp-C-Zn-Mg-Mn-Cu (FERROCITE PLUS) 106-1 MG TABS Take 1 tablet by mouth daily.  30 each  5  . HYDROcodone-acetaminophen (NORCO/VICODIN) 5-325 MG per tablet Take 1 tablet every 4-6 hours as needed for pain  30 tablet  0  . lidocaine-prilocaine (EMLA) cream Apply topically as needed. Apply 1-2 hours prior to Porta-cath access.  30 g  2  . LORazepam (ATIVAN) 0.5 MG tablet Take 1 tablet (0.5 mg total) by mouth every 6 (six) hours as needed (for nausea).  30 tablet  0  . Multiple Vitamin (MULTIVITAMIN WITH MINERALS) TABS tablet Take 1 tablet by mouth daily.      . ondansetron (ZOFRAN) 8 MG tablet Take 1-2 tablets (8-16 mg total) by mouth every 12 (twelve) hours as needed for nausea.  30 tablet  2  . oxybutynin (DITROPAN) 5 MG tablet Take 5 mg by mouth 3 (three) times daily.      Marland Kitchen oxymetazoline (AFRIN) 0.05 % nasal spray Place into the nose as needed for congestion (Helps stop nosebleed if it starts).      . pyridOXINE (VITAMIN B-6) 50 MG tablet Take 2 tabs in am and 1 in evening  for Doxil hand/foot syndrome.  100 tablet  3  . sodium chloride (OCEAN) 0.65 % nasal spray Place 1 spray into the nose as needed. Use every 1-3 hours while awake to keep nose moist, which helps to prevent nosebleeds        No current facility-administered medications for this visit.   Facility-Administered Medications Ordered in Other Visits  Medication Dose Route Frequency Provider Last Rate Last Dose  . sodium chloride 0.9 % injection 10 mL  10 mL Intravenous PRN Gordy Levan, MD   10 mL at 02/19/13 0938    History   Social History  . Marital Status: Divorced    Spouse Name: N/A    Number of Children: 2  . Years of Education: N/A   Occupational History  . [employer] History Main Topics  . Smoking status: Never Smoker   . Smokeless tobacco: Never Used  . Alcohol Use: No  . Drug Use: No  .  Sexual Activity: Not on file   Other Topics Concern  . Not on file   Social History Narrative   Gets reg exercise    Family History  Problem Relation Age of Onset  . Hypertension Mother   . Arthritis Mother   . Hypertension Father   . Diabetes Father   . Hypertension Brother   . Diabetes Brother       Alvino Chapel, MD 11/02/2013, 9:48 AM

## 2013-11-02 NOTE — Patient Instructions (Signed)
Follow up with Dr. Marko Plume. Contact our office if you have an concerns.

## 2013-11-03 ENCOUNTER — Telehealth: Payer: Self-pay | Admitting: Oncology

## 2013-11-03 NOTE — Telephone Encounter (Signed)
, °

## 2013-11-13 ENCOUNTER — Other Ambulatory Visit: Payer: Self-pay | Admitting: *Deleted

## 2013-11-13 DIAGNOSIS — R300 Dysuria: Secondary | ICD-10-CM

## 2013-11-13 NOTE — Progress Notes (Signed)
Patient reports some burning with urination and lower right back pain. Temp is normal and is able to void. She is pushing fluids and this helps. Requesting urine specimen be checked tomorrow when she is here.  Order added.

## 2013-11-14 ENCOUNTER — Ambulatory Visit (HOSPITAL_BASED_OUTPATIENT_CLINIC_OR_DEPARTMENT_OTHER): Payer: BC Managed Care – PPO

## 2013-11-14 ENCOUNTER — Other Ambulatory Visit: Payer: Self-pay | Admitting: Oncology

## 2013-11-14 ENCOUNTER — Other Ambulatory Visit: Payer: Self-pay

## 2013-11-14 ENCOUNTER — Telehealth: Payer: Self-pay

## 2013-11-14 ENCOUNTER — Other Ambulatory Visit (HOSPITAL_BASED_OUTPATIENT_CLINIC_OR_DEPARTMENT_OTHER): Payer: BC Managed Care – PPO

## 2013-11-14 VITALS — BP 127/60 | HR 83 | Temp 98.9°F | Resp 18

## 2013-11-14 DIAGNOSIS — N390 Urinary tract infection, site not specified: Secondary | ICD-10-CM

## 2013-11-14 DIAGNOSIS — C569 Malignant neoplasm of unspecified ovary: Secondary | ICD-10-CM

## 2013-11-14 DIAGNOSIS — C778 Secondary and unspecified malignant neoplasm of lymph nodes of multiple regions: Secondary | ICD-10-CM

## 2013-11-14 DIAGNOSIS — R300 Dysuria: Secondary | ICD-10-CM

## 2013-11-14 DIAGNOSIS — Z5111 Encounter for antineoplastic chemotherapy: Secondary | ICD-10-CM

## 2013-11-14 DIAGNOSIS — C801 Malignant (primary) neoplasm, unspecified: Secondary | ICD-10-CM

## 2013-11-14 LAB — CBC WITH DIFFERENTIAL/PLATELET
BASO%: 0.3 % (ref 0.0–2.0)
Basophils Absolute: 0 10*3/uL (ref 0.0–0.1)
EOS%: 0.2 % (ref 0.0–7.0)
Eosinophils Absolute: 0 10*3/uL (ref 0.0–0.5)
HEMATOCRIT: 33 % — AB (ref 34.8–46.6)
HGB: 10.9 g/dL — ABNORMAL LOW (ref 11.6–15.9)
LYMPH%: 10.4 % — AB (ref 14.0–49.7)
MCH: 30.9 pg (ref 25.1–34.0)
MCHC: 33 g/dL (ref 31.5–36.0)
MCV: 93.5 fL (ref 79.5–101.0)
MONO#: 1.1 10*3/uL — ABNORMAL HIGH (ref 0.1–0.9)
MONO%: 18.9 % — ABNORMAL HIGH (ref 0.0–14.0)
NEUT#: 4.2 10*3/uL (ref 1.5–6.5)
NEUT%: 70.2 % (ref 38.4–76.8)
PLATELETS: 202 10*3/uL (ref 145–400)
RBC: 3.53 10*6/uL — ABNORMAL LOW (ref 3.70–5.45)
RDW: 15.1 % — ABNORMAL HIGH (ref 11.2–14.5)
WBC: 6 10*3/uL (ref 3.9–10.3)
lymph#: 0.6 10*3/uL — ABNORMAL LOW (ref 0.9–3.3)

## 2013-11-14 LAB — URINALYSIS, MICROSCOPIC - CHCC
Bilirubin (Urine): NEGATIVE
Glucose: NEGATIVE mg/dL
Ketones: NEGATIVE mg/dL
Nitrite: NEGATIVE
PROTEIN: 300 mg/dL
Specific Gravity, Urine: 1.03 (ref 1.003–1.035)
UROBILINOGEN UR: 0.2 mg/dL (ref 0.2–1)
pH: 6 (ref 4.6–8.0)

## 2013-11-14 MED ORDER — ONDANSETRON 8 MG/50ML IVPB (CHCC)
8.0000 mg | Freq: Once | INTRAVENOUS | Status: AC
  Administered 2013-11-14: 8 mg via INTRAVENOUS

## 2013-11-14 MED ORDER — SODIUM CHLORIDE 0.9 % IV SOLN
Freq: Once | INTRAVENOUS | Status: AC
  Administered 2013-11-14: 10:00:00 via INTRAVENOUS

## 2013-11-14 MED ORDER — SODIUM CHLORIDE 0.9 % IJ SOLN
10.0000 mL | INTRAMUSCULAR | Status: DC | PRN
  Administered 2013-11-14: 10 mL
  Filled 2013-11-14: qty 10

## 2013-11-14 MED ORDER — ONDANSETRON 8 MG/NS 50 ML IVPB
INTRAVENOUS | Status: AC
  Filled 2013-11-14: qty 8

## 2013-11-14 MED ORDER — HEPARIN SOD (PORK) LOCK FLUSH 100 UNIT/ML IV SOLN
500.0000 [IU] | Freq: Once | INTRAVENOUS | Status: AC | PRN
  Administered 2013-11-14: 500 [IU]
  Filled 2013-11-14: qty 5

## 2013-11-14 MED ORDER — DEXAMETHASONE SODIUM PHOSPHATE 10 MG/ML IJ SOLN
INTRAMUSCULAR | Status: AC
  Filled 2013-11-14: qty 1

## 2013-11-14 MED ORDER — CIPROFLOXACIN HCL 250 MG PO TABS: 250.0000 mg | ORAL_TABLET | Freq: Two times a day (BID) | ORAL | Status: DC

## 2013-11-14 MED ORDER — DOXORUBICIN HCL LIPOSOMAL CHEMO INJECTION 2 MG/ML
40.0000 mg/m2 | Freq: Once | INTRAVENOUS | Status: AC
  Administered 2013-11-14: 68 mg via INTRAVENOUS
  Filled 2013-11-14: qty 34

## 2013-11-14 MED ORDER — DEXAMETHASONE SODIUM PHOSPHATE 10 MG/ML IJ SOLN
10.0000 mg | Freq: Once | INTRAMUSCULAR | Status: AC
  Administered 2013-11-14: 10 mg via INTRAVENOUS

## 2013-11-14 NOTE — Patient Instructions (Signed)
[facility] Discharge Instructions for Patients Receiving Chemotherapy  Today you received the following chemotherapy agents: doxil  To help prevent nausea and vomiting after your treatment, we encourage you to take your nausea medication.  Take it as often as prescribed.     If you develop nausea and vomiting that is not controlled by your nausea medication, call the clinic. If it is after clinic hours your family physician or the after hours number for the clinic or go to the Emergency Department.   BELOW ARE SYMPTOMS THAT SHOULD BE REPORTED IMMEDIATELY:  *FEVER GREATER THAN 100.5 F  *CHILLS WITH OR WITHOUT FEVER  NAUSEA AND VOMITING THAT IS NOT CONTROLLED WITH YOUR NAUSEA MEDICATION  *UNUSUAL SHORTNESS OF BREATH  *UNUSUAL BRUISING OR BLEEDING  TENDERNESS IN MOUTH AND THROAT WITH OR WITHOUT PRESENCE OF ULCERS  *URINARY PROBLEMS  *BOWEL PROBLEMS  UNUSUAL RASH Items with * indicate a potential emergency and should be followed up as soon as possible.  Feel free to call the clinic you have any questions or concerns. The clinic phone number is [phone].   I have been informed and understand all the instructions given to me. I know to contact the clinic, my physician, or go to the Emergency Department if any problems should occur. I do not have any questions at this time, but understand that I may call the clinic during office hours   should I have any questions or need assistance in obtaining follow up care.    __________________________________________  _____________  __________ Signature of Patient or Authorized Representative            Date                   Time    __________________________________________ Nurse's Signature

## 2013-11-14 NOTE — Telephone Encounter (Signed)
Message copied by Baruch Merl on Wed Nov 14, 2013  5:26 PM ------      Message from: Evlyn Clines P      Created: Wed Nov 14, 2013 12:28 PM       Labs seen and need follow up: UTI symptoms per phone note, UA looks likely infected tho she does have stent. Lab notified by LL to do culture also. Please start cipro 250 bid #[phone] and RN follow up culture until final  thanks ------

## 2013-11-14 NOTE — Telephone Encounter (Signed)
Discussed urine results as noted below by Dr. Marko Plume.  Pt. Verbalized understanding. Sent cipro prescription to [facility].

## 2013-11-16 LAB — URINE CULTURE

## 2013-11-16 MED ORDER — NITROFURANTOIN MONOHYD MACRO 100 MG PO CAPS: 100.0000 mg | ORAL_CAPSULE | Freq: Two times a day (BID) | ORAL | Status: DC

## 2013-11-16 NOTE — Telephone Encounter (Signed)
Told Kirsten Schroeder that the urine culture came back and is positive for an E. Coli infection.  The organism is more sensitive to macrobid then the cipro.  Stop the Cipro and begin the macrobid.  A Prescription  for Macrobid was sent to her pharmacy.  Patient verbalized understanding.

## 2013-11-16 NOTE — Addendum Note (Signed)
Addended by: Baruch Merl on: 11/16/2013 04:47 PM   Modules accepted: Orders

## 2013-11-25 ENCOUNTER — Other Ambulatory Visit: Payer: Self-pay | Admitting: Oncology

## 2013-11-25 DIAGNOSIS — B9620 Unspecified Escherichia coli [E. coli] as the cause of diseases classified elsewhere: Secondary | ICD-10-CM

## 2013-11-25 DIAGNOSIS — N390 Urinary tract infection, site not specified: Principal | ICD-10-CM

## 2013-11-26 ENCOUNTER — Telehealth: Payer: Self-pay

## 2013-11-26 DIAGNOSIS — B379 Candidiasis, unspecified: Secondary | ICD-10-CM

## 2013-11-26 MED ORDER — FLUCONAZOLE 100 MG PO TABS
100.0000 mg | ORAL_TABLET | Freq: Every day | ORAL | Status: DC
Start: 2013-11-26 — End: 2013-12-28

## 2013-11-26 NOTE — Telephone Encounter (Signed)
Ms. Jedlicka called stating that she has developed a vaginal yeast infection from the recent ATB for UTI. Would Dr. Marko Plume prescribe medication for this?  She has an appointment with Dr. Marko Plume on 11-28-13.

## 2013-11-26 NOTE — Telephone Encounter (Signed)
Told Kirsten Schroeder that Dr. Marko Plume called in [place] to her pharmacy for 7 days.  Dr. Marko Plume said that she could stop the medication after 3-5 days if all her symptoms cleared.  Ms. Varone verbalized understanding.

## 2013-11-28 ENCOUNTER — Telehealth: Payer: Self-pay | Admitting: Oncology

## 2013-11-28 ENCOUNTER — Other Ambulatory Visit (HOSPITAL_BASED_OUTPATIENT_CLINIC_OR_DEPARTMENT_OTHER): Payer: BC Managed Care – PPO

## 2013-11-28 ENCOUNTER — Telehealth: Payer: Self-pay | Admitting: *Deleted

## 2013-11-28 ENCOUNTER — Ambulatory Visit (HOSPITAL_BASED_OUTPATIENT_CLINIC_OR_DEPARTMENT_OTHER): Payer: BC Managed Care – PPO | Admitting: Oncology

## 2013-11-28 ENCOUNTER — Encounter: Payer: Self-pay | Admitting: Oncology

## 2013-11-28 VITALS — BP 140/82 | HR 95 | Temp 99.2°F | Resp 18 | Ht 61.0 in | Wt 140.4 lb

## 2013-11-28 DIAGNOSIS — N390 Urinary tract infection, site not specified: Secondary | ICD-10-CM

## 2013-11-28 DIAGNOSIS — B9620 Unspecified Escherichia coli [E. coli] as the cause of diseases classified elsewhere: Secondary | ICD-10-CM

## 2013-11-28 DIAGNOSIS — C569 Malignant neoplasm of unspecified ovary: Secondary | ICD-10-CM

## 2013-11-28 DIAGNOSIS — G609 Hereditary and idiopathic neuropathy, unspecified: Secondary | ICD-10-CM

## 2013-11-28 DIAGNOSIS — C801 Malignant (primary) neoplasm, unspecified: Secondary | ICD-10-CM

## 2013-11-28 DIAGNOSIS — E876 Hypokalemia: Secondary | ICD-10-CM

## 2013-11-28 LAB — CBC WITH DIFFERENTIAL/PLATELET
BASO%: 0.4 % (ref 0.0–2.0)
Basophils Absolute: 0 10*3/uL (ref 0.0–0.1)
EOS%: 0.8 % (ref 0.0–7.0)
Eosinophils Absolute: 0 10*3/uL (ref 0.0–0.5)
HCT: 34.2 % — ABNORMAL LOW (ref 34.8–46.6)
HGB: 11.3 g/dL — ABNORMAL LOW (ref 11.6–15.9)
LYMPH%: 11.6 % — AB (ref 14.0–49.7)
MCH: 31.4 pg (ref 25.1–34.0)
MCHC: 33.1 g/dL (ref 31.5–36.0)
MCV: 95.1 fL (ref 79.5–101.0)
MONO#: 0.2 10*3/uL (ref 0.1–0.9)
MONO%: 4.4 % (ref 0.0–14.0)
NEUT#: 4.4 10*3/uL (ref 1.5–6.5)
NEUT%: 82.8 % — ABNORMAL HIGH (ref 38.4–76.8)
Platelets: 189 10*3/uL (ref 145–400)
RBC: 3.59 10*6/uL — ABNORMAL LOW (ref 3.70–5.45)
RDW: 17 % — AB (ref 11.2–14.5)
WBC: 5.3 10*3/uL (ref 3.9–10.3)
lymph#: 0.6 10*3/uL — ABNORMAL LOW (ref 0.9–3.3)

## 2013-11-28 LAB — COMPREHENSIVE METABOLIC PANEL (CC13)
ALBUMIN: 3.4 g/dL — AB (ref 3.5–5.0)
ALK PHOS: 75 U/L (ref 40–150)
ALT: 15 U/L (ref 0–55)
ANION GAP: 10 meq/L (ref 3–11)
AST: 19 U/L (ref 5–34)
BUN: 7 mg/dL (ref 7.0–26.0)
CALCIUM: 10.7 mg/dL — AB (ref 8.4–10.4)
CO2: 29 meq/L (ref 22–29)
Chloride: 102 mEq/L (ref 98–109)
Creatinine: 0.8 mg/dL (ref 0.6–1.1)
GLUCOSE: 132 mg/dL (ref 70–140)
POTASSIUM: 3.6 meq/L (ref 3.5–5.1)
Sodium: 141 mEq/L (ref 136–145)
TOTAL PROTEIN: 6.9 g/dL (ref 6.4–8.3)
Total Bilirubin: 0.46 mg/dL (ref 0.20–1.20)

## 2013-11-28 NOTE — Progress Notes (Signed)
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 She had cycle 3 doxil on 11-15-23 and is due cycle 4 on 12-12-13. She saw Dr Josephina Shih on 11-02-13, with recommendation to continue present doxil, due to tolerating this well and clinically stable; he will see her back when requested.  last imaging was CT AP on 08-22-13.  She has had difficult week as elderly father was diagnosed with stage 3 lung cancer and has begun treatment by Dr Julien Nordmann. Her siblings are assisting with the father's care. She had another UTI 11-14-13 with Enterococcus sensitive to macrobid; symptoms have resolved and repeat culture sent to day to be sure cleared. She continues diflucan for vaginal yeast since UTI treatment, improved but not quite resolved. Patient otherwise if feeling reasonably well, without any significant skin reaction from doxil. Po intake fairly good, bowels moving as usual via colostomy.   Patient's 57yo father was diagnosed with lung cancer this week, now seeing Dr Julien Nordmann and started on chemo; patient's sisters and brother are assisting with father.  She has spoken with [employer], probably will take early retirement.  She has PAC and right JJ stent  ONCOLOGIC HISTORY Patient presented with RLE swelling negative for DVT July 2014, and right hydronephrosis. Biopsy of left inguinal node ([id number]) found carcinoma consistent with serous gyn primary.Neoadjuvant dose dense taxol carboplatin began 01-31-13, with improvement in pelvic mass and adenopathy by scans after 3 cycles.. She had 3 additional cycles, with progressive decrease in CA 125 from 5626 in 12-2012 to 596 by Jan 2015. CT 06-14-13 had ~ stable pelvic mass and  adenopathy. Dr Josephina Shih recommended another 3 cycles chemotherapy, regimen changed to q 3 week taxol carboplatin on 06-27-13, which unfortunately caused worsening of peripheral neuropathy and severe taxol aches.Taxotere was substituted for taxol due to neuropathy in feet for cycles 8 and 9, completed 08-08-13. CA 125 improved to low of 494 in Feb 2015, then 732 just prior to cycle 9. CT AP 08-22-13 had minimal improvement, still not adequate to consider debulking surgery. Regimen was changed to doxil, cycle 1 on 09-19-13.     Review of systems as above, also: No respiratory symptoms. No new or different pain. Stable swelling RLE. No bleeding. No problems with PAC. Peripheral neuropathy now only in toes bilaterally (improved) Remainder of 10 point Review of Systems negative.  Objective:  Vital signs in last 24 hours:  BP 140/82  Pulse 95  Temp(Src) 99.2 F (37.3 C) (Oral)  Resp 18  Ht 5\' 1"  (1.549 m)  Wt 140 lb 6.4 oz (63.685 kg)  BMI 26.54 kg/m2 Weight is down 1.5 lbs. Alert, oriented and appropriate. Ambulatory without difficulty. Looks more fatigued, very pleasant as always; daughter tells me that patient has been very upset about her father's illness. Alopecia  HEENT:PERRL, sclerae not icteric. Oral mucosa moist without lesions, posterior pharynx clear.  Neck supple. No JVD.  Lymphatics:no cervical,suraclavicular or inguinal adenopathy Resp: clear to auscultation bilaterally and normal percussion bilaterally Cardio: regular rate and rhythm. No gallop. GI: soft, nontender, not distended, no mass or organomegaly. Normally active bowel sounds. Surgical incisions not remarkable. Soft stool in ostomy bag. Musculoskeletal/ Extremities: 2-3+ swelling RLE stable without cords or tenderness; LLE without pitting edema, cords, tenderness Neuro: no increased  peripheral neuropathy. Otherwise nonfocal Skin without rash, ecchymosis, petechiae. Minimal erythema without swelling or desquamation of  hands, and no concerns around ostomy Portacath-without erythema or tenderness  Lab Results:  Results for orders placed in visit on 11/28/13  CBC WITH DIFFERENTIAL      Result Value Ref Range   WBC 5.3  3.9 - 10.3 10e3/uL   NEUT# 4.4  1.5 - 6.5 10e3/uL   HGB 11.3 (*) 11.6 - 15.9 g/dL   HCT 34.2 (*) 34.8 - 46.6 %   Platelets 189  145 - 400 10e3/uL   MCV 95.1  79.5 - 101.0 fL   MCH 31.4  25.1 - 34.0 pg   MCHC 33.1  31.5 - 36.0 g/dL   RBC 3.59 (*) 3.70 - 5.45 10e6/uL   RDW 17.0 (*) 11.2 - 14.5 %   lymph# 0.6 (*) 0.9 - 3.3 10e3/uL   MONO# 0.2  0.1 - 0.9 10e3/uL   Eosinophils Absolute 0.0  0.0 - 0.5 10e3/uL   Basophils Absolute 0.0  0.0 - 0.1 10e3/uL   NEUT% 82.8 (*) 38.4 - 76.8 %   LYMPH% 11.6 (*) 14.0 - 49.7 %   MONO% 4.4  0.0 - 14.0 %   EOS% 0.8  0.0 - 7.0 %   BASO% 0.4  0.0 - 2.0 %  COMPREHENSIVE METABOLIC PANEL (NF62)      Result Value Ref Range   Sodium 141  136 - 145 mEq/L   Potassium 3.6  3.5 - 5.1 mEq/L   Chloride 102  98 - 109 mEq/L   CO2 29  22 - 29 mEq/L   Glucose 132  70 - 140 mg/dl   BUN 7.0  7.0 - 26.0 mg/dL   Creatinine 0.8  0.6 - 1.1 mg/dL   Total Bilirubin 0.46  0.20 - 1.20 mg/dL   Alkaline Phosphatase 75  40 - 150 U/L   AST 19  5 - 34 U/L   ALT 15  0 - 55 U/L   Total Protein 6.9  6.4 - 8.3 g/dL   Albumin 3.4 (*) 3.5 - 5.0 g/dL   Calcium 10.7 (*) 8.4 - 10.4 mg/dL   Anion Gap 10  3 - 11 mEq/L    CA 125 available after visit is lower at 839, having been 945 in June and 925 in May.  Studies/Results:  No results found.  Medications: I have reviewed the patient's current medications.No changes in present regimen.  DISCUSSION: Patient understands that we are treating to control disease and that frequently with doxil we see marker increase initially prior to improvement.  Assessment/Plan: 1. 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 Doxil begun 09-19-13, tolerating adequately, cycle 4 planned 12-12-13 as long as ANC >=1.5 and plt >=100k. Continue good skin care and supplemental B6. I will see her back with labs at least 01-02-14. 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 PAC in  7.enterococcus UTI again 6-24, resolved with nitrofurantoin, and vaginal yeast now being treated with diflucan  8.rash to PCN  9. Chemo related cytopenias: has not needed gCSF with doxil  10. Extremely sensitive to oral contrast with CTs causing diarrhea, needs  water based oral contrast regimen in radiology. Hypokalemia: related to ileostomy output, generally manages with diet.  11.RLE swelling: no DVT by prior evaluations  12.weight fairly stable now 13.social stress with new lung cancer diagnosis in elderly father   All questions answered. Chemo orders confirmed. Patient is in agreement with all of plan.     Taraoluwa Thakur P, MD   11/28/2013, 1:28 PM

## 2013-11-28 NOTE — Telephone Encounter (Signed)
Per staff message and POF I have scheduled appts. Advised scheduler of appts. JMW  

## 2013-11-28 NOTE — Telephone Encounter (Signed)
, °

## 2013-11-29 ENCOUNTER — Other Ambulatory Visit: Payer: Self-pay | Admitting: Oncology

## 2013-11-29 ENCOUNTER — Telehealth: Payer: Self-pay | Admitting: *Deleted

## 2013-11-29 DIAGNOSIS — C569 Malignant neoplasm of unspecified ovary: Secondary | ICD-10-CM

## 2013-11-29 LAB — URINE CULTURE

## 2013-11-29 LAB — CA 125: CA 125: 839.1 U/mL — AB (ref 0.0–30.2)

## 2013-11-29 NOTE — Telephone Encounter (Signed)
Pt notified of results below 

## 2013-11-29 NOTE — Telephone Encounter (Signed)
Message copied by Patton Salles on Thu Nov 29, 2013  2:33 PM ------      Message from: Evlyn Clines P      Created: Thu Nov 29, 2013  2:14 PM       Labs seen and need follow up: please let her know ca 125 is a bit lower, at 839.  RN please also watch for urine culture result from 7-8 as we need to let her know about that  thanks ------

## 2013-11-30 ENCOUNTER — Telehealth: Payer: Self-pay | Admitting: *Deleted

## 2013-11-30 NOTE — Telephone Encounter (Signed)
Message copied by Sharlynn Oliphant A on Fri Nov 30, 2013  1:24 PM ------      Message from: Gordy Levan      Created: Fri Nov 30, 2013  8:14 AM       Labs seen and need follow up please let her know no infection apparent now ------

## 2013-11-30 NOTE — Telephone Encounter (Signed)
Left message with results below 

## 2013-12-12 ENCOUNTER — Other Ambulatory Visit (HOSPITAL_BASED_OUTPATIENT_CLINIC_OR_DEPARTMENT_OTHER): Payer: BC Managed Care – PPO

## 2013-12-12 ENCOUNTER — Ambulatory Visit (HOSPITAL_BASED_OUTPATIENT_CLINIC_OR_DEPARTMENT_OTHER): Payer: BC Managed Care – PPO

## 2013-12-12 VITALS — BP 139/71 | HR 78 | Temp 98.3°F | Resp 17

## 2013-12-12 DIAGNOSIS — C801 Malignant (primary) neoplasm, unspecified: Secondary | ICD-10-CM

## 2013-12-12 DIAGNOSIS — Z5111 Encounter for antineoplastic chemotherapy: Secondary | ICD-10-CM

## 2013-12-12 DIAGNOSIS — C569 Malignant neoplasm of unspecified ovary: Secondary | ICD-10-CM

## 2013-12-12 DIAGNOSIS — C778 Secondary and unspecified malignant neoplasm of lymph nodes of multiple regions: Secondary | ICD-10-CM

## 2013-12-12 LAB — COMPREHENSIVE METABOLIC PANEL (CC13)
ALBUMIN: 3 g/dL — AB (ref 3.5–5.0)
ALT: 10 U/L (ref 0–55)
AST: 16 U/L (ref 5–34)
Alkaline Phosphatase: 70 U/L (ref 40–150)
Anion Gap: 11 mEq/L (ref 3–11)
BUN: 5.7 mg/dL — ABNORMAL LOW (ref 7.0–26.0)
CALCIUM: 9.9 mg/dL (ref 8.4–10.4)
CHLORIDE: 103 meq/L (ref 98–109)
CO2: 28 mEq/L (ref 22–29)
Creatinine: 0.8 mg/dL (ref 0.6–1.1)
Glucose: 104 mg/dl (ref 70–140)
POTASSIUM: 3.5 meq/L (ref 3.5–5.1)
SODIUM: 143 meq/L (ref 136–145)
Total Bilirubin: 0.36 mg/dL (ref 0.20–1.20)
Total Protein: 6.6 g/dL (ref 6.4–8.3)

## 2013-12-12 LAB — CBC WITH DIFFERENTIAL/PLATELET
BASO%: 0.8 % (ref 0.0–2.0)
Basophils Absolute: 0 10*3/uL (ref 0.0–0.1)
EOS%: 0.7 % (ref 0.0–7.0)
Eosinophils Absolute: 0 10*3/uL (ref 0.0–0.5)
HCT: 32.3 % — ABNORMAL LOW (ref 34.8–46.6)
HEMOGLOBIN: 10.7 g/dL — AB (ref 11.6–15.9)
LYMPH#: 0.6 10*3/uL — AB (ref 0.9–3.3)
LYMPH%: 13.1 % — ABNORMAL LOW (ref 14.0–49.7)
MCH: 30.8 pg (ref 25.1–34.0)
MCHC: 33 g/dL (ref 31.5–36.0)
MCV: 93.5 fL (ref 79.5–101.0)
MONO#: 0.5 10*3/uL (ref 0.1–0.9)
MONO%: 10.8 % (ref 0.0–14.0)
NEUT#: 3.4 10*3/uL (ref 1.5–6.5)
NEUT%: 74.6 % (ref 38.4–76.8)
Platelets: 319 10*3/uL (ref 145–400)
RBC: 3.46 10*6/uL — AB (ref 3.70–5.45)
RDW: 17.9 % — AB (ref 11.2–14.5)
WBC: 4.5 10*3/uL (ref 3.9–10.3)

## 2013-12-12 MED ORDER — DOXORUBICIN HCL LIPOSOMAL CHEMO INJECTION 2 MG/ML
40.0000 mg/m2 | Freq: Once | INTRAVENOUS | Status: AC
  Administered 2013-12-12: 68 mg via INTRAVENOUS
  Filled 2013-12-12: qty 34

## 2013-12-12 MED ORDER — DEXAMETHASONE SODIUM PHOSPHATE 10 MG/ML IJ SOLN
INTRAMUSCULAR | Status: AC
  Filled 2013-12-12: qty 1

## 2013-12-12 MED ORDER — ONDANSETRON 8 MG/NS 50 ML IVPB
INTRAVENOUS | Status: AC
  Filled 2013-12-12: qty 8

## 2013-12-12 MED ORDER — ONDANSETRON 8 MG/50ML IVPB (CHCC)
8.0000 mg | Freq: Once | INTRAVENOUS | Status: AC
  Administered 2013-12-12: 8 mg via INTRAVENOUS

## 2013-12-12 MED ORDER — DEXAMETHASONE SODIUM PHOSPHATE 10 MG/ML IJ SOLN
10.0000 mg | Freq: Once | INTRAMUSCULAR | Status: AC
  Administered 2013-12-12: 10 mg via INTRAVENOUS

## 2013-12-12 MED ORDER — SODIUM CHLORIDE 0.9 % IJ SOLN
10.0000 mL | INTRAMUSCULAR | Status: DC | PRN
  Administered 2013-12-12: 10 mL
  Filled 2013-12-12: qty 10

## 2013-12-12 MED ORDER — HEPARIN SOD (PORK) LOCK FLUSH 100 UNIT/ML IV SOLN
500.0000 [IU] | Freq: Once | INTRAVENOUS | Status: AC | PRN
  Administered 2013-12-12: 500 [IU]
  Filled 2013-12-12: qty 5

## 2013-12-12 MED ORDER — SODIUM CHLORIDE 0.9 % IV SOLN
Freq: Once | INTRAVENOUS | Status: AC
  Administered 2013-12-12: 09:00:00 via INTRAVENOUS

## 2013-12-12 NOTE — Patient Instructions (Signed)
[facility] Discharge Instructions for Patients Receiving Chemotherapy  Today you received the following chemotherapy agents: Doxil  To help prevent nausea and vomiting after your treatment, we encourage you to take your nausea medication as prescribed by your physician.    If you develop nausea and vomiting that is not controlled by your nausea medication, call the clinic.   BELOW ARE SYMPTOMS THAT SHOULD BE REPORTED IMMEDIATELY:  *FEVER GREATER THAN 100.5 F  *CHILLS WITH OR WITHOUT FEVER  NAUSEA AND VOMITING THAT IS NOT CONTROLLED WITH YOUR NAUSEA MEDICATION  *UNUSUAL SHORTNESS OF BREATH  *UNUSUAL BRUISING OR BLEEDING  TENDERNESS IN MOUTH AND THROAT WITH OR WITHOUT PRESENCE OF ULCERS  *URINARY PROBLEMS  *BOWEL PROBLEMS  UNUSUAL RASH Items with * indicate a potential emergency and should be followed up as soon as possible.  Feel free to call the clinic you have any questions or concerns. The clinic phone number is [phone].

## 2013-12-28 ENCOUNTER — Telehealth: Payer: Self-pay

## 2013-12-28 DIAGNOSIS — B379 Candidiasis, unspecified: Secondary | ICD-10-CM

## 2013-12-28 MED ORDER — FLUCONAZOLE 100 MG PO TABS: 100.0000 mg | ORAL_TABLET | Freq: Every day | ORAL | Status: DC

## 2013-12-28 NOTE — Telephone Encounter (Signed)
Spoke with Dr. Zada Finders office and the Urine culture form 12-14-13 was contaminated.  Dr. Marko Plume notified.

## 2013-12-28 NOTE — Telephone Encounter (Signed)
Kirsten Schroeder called stating that she feels asthough she has a UTI. Dr. Diona Fanti saw her 12-14-13 and treated her for UTI with Keflex 500 mg tid x 5 days.  She feels as though the infection has not cleared. She is not able to come in today for another Urine sample as her father passed away and is working on funeral arrangements. Funeral is Sunday 12-30-13. Told her that Dr. Marko Plume will give her cipro 250 mg bid for 7 days.  [name] actually has 13 tabs left from another infection.  Told her to use this supply. Will send in a prescription for Diflucan as she gets a yeast infection from ATB. She will keep appointment on 01-02-14 as scheduled.

## 2014-01-02 ENCOUNTER — Telehealth: Payer: Self-pay | Admitting: Oncology

## 2014-01-02 ENCOUNTER — Encounter: Payer: Self-pay | Admitting: Oncology

## 2014-01-02 ENCOUNTER — Other Ambulatory Visit (HOSPITAL_BASED_OUTPATIENT_CLINIC_OR_DEPARTMENT_OTHER): Payer: BC Managed Care – PPO

## 2014-01-02 ENCOUNTER — Other Ambulatory Visit: Payer: Self-pay | Admitting: Oncology

## 2014-01-02 ENCOUNTER — Ambulatory Visit (HOSPITAL_BASED_OUTPATIENT_CLINIC_OR_DEPARTMENT_OTHER): Payer: BC Managed Care – PPO | Admitting: Oncology

## 2014-01-02 VITALS — BP 136/66 | HR 75 | Temp 98.3°F | Resp 18 | Ht 61.0 in | Wt 137.9 lb

## 2014-01-02 DIAGNOSIS — C569 Malignant neoplasm of unspecified ovary: Secondary | ICD-10-CM

## 2014-01-02 DIAGNOSIS — N1330 Unspecified hydronephrosis: Secondary | ICD-10-CM

## 2014-01-02 DIAGNOSIS — C801 Malignant (primary) neoplasm, unspecified: Secondary | ICD-10-CM

## 2014-01-02 LAB — CBC WITH DIFFERENTIAL/PLATELET
BASO%: 1 % (ref 0.0–2.0)
BASOS ABS: 0 10*3/uL (ref 0.0–0.1)
EOS%: 1.3 % (ref 0.0–7.0)
Eosinophils Absolute: 0 10*3/uL (ref 0.0–0.5)
HEMATOCRIT: 32.5 % — AB (ref 34.8–46.6)
HGB: 10.6 g/dL — ABNORMAL LOW (ref 11.6–15.9)
LYMPH%: 17.7 % (ref 14.0–49.7)
MCH: 30.9 pg (ref 25.1–34.0)
MCHC: 32.7 g/dL (ref 31.5–36.0)
MCV: 94.6 fL (ref 79.5–101.0)
MONO#: 0.4 10*3/uL (ref 0.1–0.9)
MONO%: 12.4 % (ref 0.0–14.0)
NEUT#: 2.1 10*3/uL (ref 1.5–6.5)
NEUT%: 67.6 % (ref 38.4–76.8)
Platelets: 263 10*3/uL (ref 145–400)
RBC: 3.44 10*6/uL — ABNORMAL LOW (ref 3.70–5.45)
RDW: 20 % — ABNORMAL HIGH (ref 11.2–14.5)
WBC: 3.1 10*3/uL — ABNORMAL LOW (ref 3.9–10.3)
lymph#: 0.6 10*3/uL — ABNORMAL LOW (ref 0.9–3.3)

## 2014-01-02 LAB — COMPREHENSIVE METABOLIC PANEL (CC13)
ALT: 7 U/L (ref 0–55)
ANION GAP: 13 meq/L — AB (ref 3–11)
AST: 19 U/L (ref 5–34)
Albumin: 3.4 g/dL — ABNORMAL LOW (ref 3.5–5.0)
Alkaline Phosphatase: 68 U/L (ref 40–150)
BUN: 4.7 mg/dL — ABNORMAL LOW (ref 7.0–26.0)
CALCIUM: 9.8 mg/dL (ref 8.4–10.4)
CHLORIDE: 102 meq/L (ref 98–109)
CO2: 28 meq/L (ref 22–29)
CREATININE: 0.8 mg/dL (ref 0.6–1.1)
GLUCOSE: 95 mg/dL (ref 70–140)
Potassium: 3.4 mEq/L — ABNORMAL LOW (ref 3.5–5.1)
Sodium: 144 mEq/L (ref 136–145)
Total Bilirubin: 0.24 mg/dL (ref 0.20–1.20)
Total Protein: 6.7 g/dL (ref 6.4–8.3)

## 2014-01-02 MED ORDER — OXYBUTYNIN CHLORIDE 5 MG PO TABS: 5.0000 mg | ORAL_TABLET | Freq: Three times a day (TID) | ORAL | Status: DC

## 2014-01-02 NOTE — Progress Notes (Signed)
OFFICE PROGRESS NOTE   01/02/2014   Physicians: Deedra Ehrich; Evalee Mutton, Marlene Bast, Floy Sabina, Annie Main   INTERVAL HISTORY:   Patient is seen, together with daughter, in scheduled follow up of advanced papillary serous gyn carcinoma thought ovarian primary, having had cycle 4 doxil on 12-12-13. Last imaging was CT AP 2013/09/12.    Her elderly father died a week ago, of complications of recently diagnosed lung cancer. Patient had symptoms of UTI then, with cipro started empirically on 12-28-13; UTI symptoms have resolved entirely and she will let us know if these recur, for urine culture if so. Appetite had been adequate prior to father's death, no nausea or vomiting now. She has had no significant skin toxicity from the doxil. She fatigues easily, but not moreso than prior to doxil, and overall feels that she is tolerating this the best of any chemotherapy.  She has PAC and right JJ stent; stent will be changed again in Dec. We will let Dr Diona Fanti know results of any imaging prior to that.  ONCOLOGIC HISTORY Patient presented with RLE swelling negative for DVT July 2014, and right hydronephrosis. Biopsy of left inguinal node ([id number]) found carcinoma consistent with serous gyn primary.Neoadjuvant dose dense taxol carboplatin began 01-31-13, with improvement in pelvic mass and adenopathy by scans after 3 cycles.. She had 3 additional cycles, with progressive decrease in CA 125 from 5626 in 12-2012 to 596 by Jan 2015. CT 06-14-13 had ~ stable pelvic mass and adenopathy. Dr Josephina Shih recommended another 3 cycles chemotherapy, regimen changed to q 3 week taxol carboplatin on 06-27-13, which unfortunately caused worsening of peripheral neuropathy and severe taxol aches.Taxotere was substituted for taxol due to neuropathy in feet for cycles 8 and 9, completed 08-08-13. CA 125 improved to low of 494 in Feb 2015, then 732 just prior to cycle 9. CT AP 09-12-13 had minimal  improvement, still not adequate to consider debulking surgery. Regimen was changed to doxil, cycle 1 on 09-19-13.   Review of systems as above, also: No fever. No abdominal or pelvic pain. No increased SOB, no chest pain. Is able to sleep. Remainder of 10 point Review of Systems negative.  Objective:  Vital signs in last 24 hours:  BP 136/66  Pulse 75  Temp(Src) 98.3 F (36.8 C) (Oral)  Resp 18  Ht 5\' 1"  (1.549 m)  Wt 137 lb 14.4 oz (62.551 kg)  BMI 26.07 kg/m2 Weight is down 2 lbs. Alert, oriented and appropriate. Ambulatory without difficulty. Respirations not labored RA.   HEENT:PERRL, sclerae not icteric. Oral mucosa moist without lesions, posterior pharynx clear.  Neck supple. No JVD.  Lymphatics:no cervical,suraclavicular,  or inguinal adenopathy Resp: clear to auscultation bilaterally and normal percussion bilaterally. Respirations not labored RA Cardio: regular rate and rhythm. No gallop. GI: soft, nontender, not distended, no mass or organomegaly. Normally active bowel sounds. Surgical incision not remarkable. Colostomy ok. Musculoskeletal/ Extremities: decreased swelling RLE and LLE/UE without pitting edema, cords, tenderness Neuro: no change peripheral neuropathy. Otherwise nonfocal. PSYCH appropriate mood and affect Skin without rash, ecchymosis, petechiae. Minimal erythema palms, no swelling or skin desquamation Portacath-without erythema or tenderness  Lab Results:  Results for orders placed in visit on 01/02/14  CBC WITH DIFFERENTIAL      Result Value Ref Range   WBC 3.1 (*) 3.9 - 10.3 10e3/uL   NEUT# 2.1  1.5 - 6.5 10e3/uL   HGB 10.6 (*) 11.6 - 15.9 g/dL   HCT 32.5 (*) 34.8 - 46.6 %  Platelets 263  145 - 400 10e3/uL   MCV 94.6  79.5 - 101.0 fL   MCH 30.9  25.1 - 34.0 pg   MCHC 32.7  31.5 - 36.0 g/dL   RBC 3.44 (*) 3.70 - 5.45 10e6/uL   RDW 20.0 (*) 11.2 - 14.5 %   lymph# 0.6 (*) 0.9 - 3.3 10e3/uL   MONO# 0.4  0.1 - 0.9 10e3/uL   Eosinophils Absolute  0.0  0.0 - 0.5 10e3/uL   Basophils Absolute 0.0  0.0 - 0.1 10e3/uL   NEUT% 67.6  38.4 - 76.8 %   LYMPH% 17.7  14.0 - 49.7 %   MONO% 12.4  0.0 - 14.0 %   EOS% 1.3  0.0 - 7.0 %   BASO% 1.0  0.0 - 2.0 %    CMET available after visit normal with exception of K 3.4, BUN 4.7 and albumin 3.4  CA 125 available after visit a little lower again, at 804, having been 839 in early July and 945 early June  Studies/Results:  No results found.  Medications: I have reviewed the patient's current medications.We will refill ditropan to save her contacting Dr Dahlstedt's office.  DISCUSSION: patient is glad to continue doxil. We may repeat scans after 6 cycles, with upcoming treatment to be #5.  Assessment/Plan:  1. 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 Doxil begun 09-19-13, tolerating adequately, cycle 5 planned 01-09-14 as long as [place] >=1.5 and plt >=100k. Continue good skin care and supplemental B6. I will see her back with labs 2-3 weeks after next chemo. May repeat scans after 6 cycles doxil.  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 Continue ditropan 4.post total colectomy for ulcerative colitis 1990, with ileostomy  5.multiple blood transfusions with ulcerative colitis previously, and during this chemo  6. PAC in  7.enterococcus UTI again 6-24, resolved with nitrofurantoin, and vaginal yeast now being treated with diflucan  8.rash to PCN  9. Chemo related cytopenias: has not needed gCSF with doxil  10. Extremely sensitive to oral contrast with CTs causing diarrhea, needs water based oral contrast regimen in radiology. Hypokalemia: related to ileostomy output, generally manages with diet.  11.RLE swelling: no DVT by prior evaluations   12.elderly father died last week with lung cancer. She seems to be doing ok, but I will let [facility] support staff know.   Chemo orders completed. 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    Kirsten Gerety P, MD   01/02/2014, 1:23 PM

## 2014-01-02 NOTE — Telephone Encounter (Signed)
, °

## 2014-01-03 LAB — CA 125: CA 125: 803.9 U/mL — AB (ref 0.0–30.2)

## 2014-01-09 ENCOUNTER — Ambulatory Visit (HOSPITAL_BASED_OUTPATIENT_CLINIC_OR_DEPARTMENT_OTHER): Payer: BC Managed Care – PPO

## 2014-01-09 ENCOUNTER — Other Ambulatory Visit: Payer: Self-pay

## 2014-01-09 VITALS — BP 145/75 | HR 72 | Temp 98.8°F | Resp 18

## 2014-01-09 DIAGNOSIS — C801 Malignant (primary) neoplasm, unspecified: Secondary | ICD-10-CM

## 2014-01-09 DIAGNOSIS — C778 Secondary and unspecified malignant neoplasm of lymph nodes of multiple regions: Secondary | ICD-10-CM

## 2014-01-09 DIAGNOSIS — Z5111 Encounter for antineoplastic chemotherapy: Secondary | ICD-10-CM

## 2014-01-09 DIAGNOSIS — N1330 Unspecified hydronephrosis: Secondary | ICD-10-CM

## 2014-01-09 DIAGNOSIS — C569 Malignant neoplasm of unspecified ovary: Secondary | ICD-10-CM

## 2014-01-09 LAB — CBC WITH DIFFERENTIAL/PLATELET
BASO%: 1 % (ref 0.0–2.0)
Basophils Absolute: 0 10*3/uL (ref 0.0–0.1)
EOS ABS: 0.1 10*3/uL (ref 0.0–0.5)
EOS%: 2 % (ref 0.0–7.0)
HCT: 33.3 % — ABNORMAL LOW (ref 34.8–46.6)
HGB: 11 g/dL — ABNORMAL LOW (ref 11.6–15.9)
LYMPH%: 18 % (ref 14.0–49.7)
MCH: 31.4 pg (ref 25.1–34.0)
MCHC: 33 g/dL (ref 31.5–36.0)
MCV: 95.2 fL (ref 79.5–101.0)
MONO#: 0.4 10*3/uL (ref 0.1–0.9)
MONO%: 13.7 % (ref 0.0–14.0)
NEUT%: 65.3 % (ref 38.4–76.8)
NEUTROS ABS: 2.1 10*3/uL (ref 1.5–6.5)
Platelets: 215 10*3/uL (ref 145–400)
RBC: 3.5 10*6/uL — AB (ref 3.70–5.45)
RDW: 19.7 % — AB (ref 11.2–14.5)
WBC: 3.2 10*3/uL — ABNORMAL LOW (ref 3.9–10.3)
lymph#: 0.6 10*3/uL — ABNORMAL LOW (ref 0.9–3.3)

## 2014-01-09 LAB — COMPREHENSIVE METABOLIC PANEL (CC13)
ALK PHOS: 80 U/L (ref 40–150)
ALT: 6 U/L (ref 0–55)
AST: 19 U/L (ref 5–34)
Albumin: 3.3 g/dL — ABNORMAL LOW (ref 3.5–5.0)
Anion Gap: 9 mEq/L (ref 3–11)
BUN: 7.2 mg/dL (ref 7.0–26.0)
CO2: 28 mEq/L (ref 22–29)
Calcium: 9.7 mg/dL (ref 8.4–10.4)
Chloride: 105 mEq/L (ref 98–109)
Creatinine: 0.8 mg/dL (ref 0.6–1.1)
Glucose: 94 mg/dl (ref 70–140)
Potassium: 3.8 mEq/L (ref 3.5–5.1)
SODIUM: 142 meq/L (ref 136–145)
Total Bilirubin: 0.24 mg/dL (ref 0.20–1.20)
Total Protein: 6.3 g/dL — ABNORMAL LOW (ref 6.4–8.3)

## 2014-01-09 MED ORDER — DEXAMETHASONE SODIUM PHOSPHATE 10 MG/ML IJ SOLN
10.0000 mg | Freq: Once | INTRAMUSCULAR | Status: AC
Start: 2014-01-09 — End: 2014-01-09
  Administered 2014-01-09: 10 mg via INTRAVENOUS

## 2014-01-09 MED ORDER — ONDANSETRON 8 MG/50ML IVPB (CHCC)
8.0000 mg | Freq: Once | INTRAVENOUS | Status: AC
  Administered 2014-01-09: 8 mg via INTRAVENOUS

## 2014-01-09 MED ORDER — SODIUM CHLORIDE 0.9 % IV SOLN
Freq: Once | INTRAVENOUS | Status: AC
  Administered 2014-01-09: 10:00:00 via INTRAVENOUS

## 2014-01-09 MED ORDER — HEPARIN SOD (PORK) LOCK FLUSH 100 UNIT/ML IV SOLN
500.0000 [IU] | Freq: Once | INTRAVENOUS | Status: AC | PRN
  Administered 2014-01-09: 500 [IU]
  Filled 2014-01-09: qty 5

## 2014-01-09 MED ORDER — ONDANSETRON 8 MG/NS 50 ML IVPB
INTRAVENOUS | Status: AC
  Filled 2014-01-09: qty 8

## 2014-01-09 MED ORDER — DEXAMETHASONE SODIUM PHOSPHATE 10 MG/ML IJ SOLN
INTRAMUSCULAR | Status: AC
  Filled 2014-01-09: qty 1

## 2014-01-09 MED ORDER — SODIUM CHLORIDE 0.9 % IJ SOLN
10.0000 mL | INTRAMUSCULAR | Status: DC | PRN
  Administered 2014-01-09: 10 mL
  Filled 2014-01-09: qty 10

## 2014-01-09 MED ORDER — DOXORUBICIN HCL LIPOSOMAL CHEMO INJECTION 2 MG/ML
40.0000 mg/m2 | Freq: Once | INTRAVENOUS | Status: AC
  Administered 2014-01-09: 68 mg via INTRAVENOUS
  Filled 2014-01-09: qty 34

## 2014-01-09 NOTE — Patient Instructions (Signed)
[facility] Discharge Instructions for Patients Receiving Chemotherapy  Today you received the following chemotherapy agents Doxorubicin  To help prevent nausea and vomiting after your treatment, we encourage you to take your nausea medication     If you develop nausea and vomiting that is not controlled by your nausea medication, call the clinic.   BELOW ARE SYMPTOMS THAT SHOULD BE REPORTED IMMEDIATELY:  *FEVER GREATER THAN 100.5 F  *CHILLS WITH OR WITHOUT FEVER  NAUSEA AND VOMITING THAT IS NOT CONTROLLED WITH YOUR NAUSEA MEDICATION  *UNUSUAL SHORTNESS OF BREATH  *UNUSUAL BRUISING OR BLEEDING  TENDERNESS IN MOUTH AND THROAT WITH OR WITHOUT PRESENCE OF ULCERS  *URINARY PROBLEMS  *BOWEL PROBLEMS  UNUSUAL RASH Items with * indicate a potential emergency and should be followed up as soon as possible.  Feel free to call the clinic you have any questions or concerns. The clinic phone number is [phone].

## 2014-01-27 ENCOUNTER — Other Ambulatory Visit: Payer: Self-pay | Admitting: Oncology

## 2014-01-27 DIAGNOSIS — C569 Malignant neoplasm of unspecified ovary: Secondary | ICD-10-CM

## 2014-01-29 ENCOUNTER — Telehealth: Payer: Self-pay | Admitting: *Deleted

## 2014-01-29 NOTE — Telephone Encounter (Signed)
Patient left VM asking if urine culture could be added to her lab appointment tomorrow - pt states she is "having a little trouble urinating." Per Dr. Marko Plume  orders placed and patient called back to let her know.

## 2014-01-30 ENCOUNTER — Encounter: Payer: Self-pay | Admitting: Oncology

## 2014-01-30 ENCOUNTER — Other Ambulatory Visit (HOSPITAL_BASED_OUTPATIENT_CLINIC_OR_DEPARTMENT_OTHER): Payer: BC Managed Care – PPO

## 2014-01-30 ENCOUNTER — Other Ambulatory Visit: Payer: BC Managed Care – PPO

## 2014-01-30 ENCOUNTER — Telehealth: Payer: Self-pay | Admitting: Oncology

## 2014-01-30 ENCOUNTER — Ambulatory Visit (HOSPITAL_BASED_OUTPATIENT_CLINIC_OR_DEPARTMENT_OTHER): Payer: BC Managed Care – PPO | Admitting: Oncology

## 2014-01-30 ENCOUNTER — Telehealth: Payer: Self-pay | Admitting: *Deleted

## 2014-01-30 VITALS — BP 134/67 | HR 75 | Temp 98.3°F | Resp 20 | Ht 61.0 in | Wt 142.2 lb

## 2014-01-30 DIAGNOSIS — M7989 Other specified soft tissue disorders: Secondary | ICD-10-CM

## 2014-01-30 DIAGNOSIS — C569 Malignant neoplasm of unspecified ovary: Secondary | ICD-10-CM

## 2014-01-30 DIAGNOSIS — G620 Drug-induced polyneuropathy: Secondary | ICD-10-CM

## 2014-01-30 DIAGNOSIS — N390 Urinary tract infection, site not specified: Secondary | ICD-10-CM

## 2014-01-30 DIAGNOSIS — D759 Disease of blood and blood-forming organs, unspecified: Secondary | ICD-10-CM

## 2014-01-30 DIAGNOSIS — C801 Malignant (primary) neoplasm, unspecified: Secondary | ICD-10-CM

## 2014-01-30 DIAGNOSIS — N1330 Unspecified hydronephrosis: Secondary | ICD-10-CM

## 2014-01-30 LAB — URINALYSIS, MICROSCOPIC - CHCC
BILIRUBIN (URINE): NEGATIVE
Glucose: NEGATIVE mg/dL
KETONES: NEGATIVE mg/dL
Nitrite: NEGATIVE
Protein: 30 mg/dL
Specific Gravity, Urine: 1.015 (ref 1.003–1.035)
Urobilinogen, UR: 0.2 mg/dL (ref 0.2–1)
pH: 6 (ref 4.6–8.0)

## 2014-01-30 LAB — CBC WITH DIFFERENTIAL/PLATELET
BASO%: 1.1 % (ref 0.0–2.0)
BASOS ABS: 0 10*3/uL (ref 0.0–0.1)
EOS%: 1.4 % (ref 0.0–7.0)
Eosinophils Absolute: 0 10*3/uL (ref 0.0–0.5)
HCT: 35.2 % (ref 34.8–46.6)
HEMOGLOBIN: 11.5 g/dL — AB (ref 11.6–15.9)
LYMPH%: 20.5 % (ref 14.0–49.7)
MCH: 31.6 pg (ref 25.1–34.0)
MCHC: 32.7 g/dL (ref 31.5–36.0)
MCV: 96.7 fL (ref 79.5–101.0)
MONO#: 0.5 10*3/uL (ref 0.1–0.9)
MONO%: 16.9 % — ABNORMAL HIGH (ref 0.0–14.0)
NEUT#: 1.7 10*3/uL (ref 1.5–6.5)
NEUT%: 60.1 % (ref 38.4–76.8)
Platelets: 175 10*3/uL (ref 145–400)
RBC: 3.64 10*6/uL — AB (ref 3.70–5.45)
RDW: 16.8 % — ABNORMAL HIGH (ref 11.2–14.5)
WBC: 2.8 10*3/uL — ABNORMAL LOW (ref 3.9–10.3)
lymph#: 0.6 10*3/uL — ABNORMAL LOW (ref 0.9–3.3)

## 2014-01-30 LAB — COMPREHENSIVE METABOLIC PANEL (CC13)
ALBUMIN: 3.4 g/dL — AB (ref 3.5–5.0)
ALK PHOS: 81 U/L (ref 40–150)
ALT: 10 U/L (ref 0–55)
AST: 21 U/L (ref 5–34)
Anion Gap: 9 mEq/L (ref 3–11)
BUN: 6.4 mg/dL — AB (ref 7.0–26.0)
CO2: 29 mEq/L (ref 22–29)
CREATININE: 0.7 mg/dL (ref 0.6–1.1)
Calcium: 9.7 mg/dL (ref 8.4–10.4)
Chloride: 104 mEq/L (ref 98–109)
Glucose: 96 mg/dl (ref 70–140)
POTASSIUM: 3.5 meq/L (ref 3.5–5.1)
Sodium: 142 mEq/L (ref 136–145)
Total Bilirubin: 0.31 mg/dL (ref 0.20–1.20)
Total Protein: 6.6 g/dL (ref 6.4–8.3)

## 2014-01-30 MED ORDER — FLUCONAZOLE 100 MG PO TABS: 100.0000 mg | ORAL_TABLET | Freq: Every day | ORAL | Status: DC

## 2014-01-30 MED ORDER — CIPROFLOXACIN HCL 250 MG PO TABS: 250.0000 mg | ORAL_TABLET | Freq: Two times a day (BID) | ORAL | Status: DC

## 2014-01-30 NOTE — Telephone Encounter (Signed)
Per staff message and POF I have scheduled appts. Advised scheduler of appts. Advised scheduler to move lab   JMW  

## 2014-01-30 NOTE — Telephone Encounter (Signed)
per pof to sch pt appt-sent MW email to sch pt trmt-pt has lab time and will get updated appt 9/14 appt-adv WL wil call to adv C

## 2014-01-30 NOTE — Progress Notes (Signed)
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 She had cycle 5 doxil on 01-09-14.  She has had symptoms of another UTI for past 1-2 days, tho no fever, with urine culture sent today and will empirically begin cipro awaiting those results. She again tolerated doxil well, still without significant skin problems, and had felt fairly well until new UTI symptoms. Appetite has been better (until UTI symptoms), colostomy is functioning well, no hematuria or other bleeding, no increased SOB, no new or different abdominal pain.  She has PAC and right JJ stent. Stent is due change in Dec; Dr Diona Fanti needs reports of imaging studies.  She has not had flu vaccine, but will not give that today due to UTI.  Patient's elderly mother is living alone since recent death of her father, with medical problems. Patient has been trying to help during day, but is not strong enough to do this, and family is looking into other options to assist  I have completed disability forms for patient's employer after visit today.   ONCOLOGIC HISTORY Patient presented with RLE swelling negative for DVT July 2014, and right hydronephrosis. Biopsy of left inguinal node ([id number]) found carcinoma consistent with serous gyn primary.Neoadjuvant dose dense taxol carboplatin began 01-31-13, with improvement in pelvic mass and adenopathy by scans after 3 cycles.. She had 3 additional cycles, with progressive decrease in CA 125 from 5626 in 12-2012 to 596 by Jan 2015. CT 06-14-13 had ~ stable pelvic mass and adenopathy. Dr Josephina Shih recommended another 3 cycles chemotherapy, regimen changed to q  3 week taxol carboplatin on 06-27-13, which unfortunately caused worsening of peripheral neuropathy and severe taxol aches.Taxotere was substituted for taxol due to neuropathy in feet for cycles 8 and 9, completed 08-08-13. CA 125 improved to low of 494 in Feb 2015, then 732 just prior to cycle 9. CT AP 08-22-13 had minimal improvement, still not adequate to consider debulking surgery. Regimen was changed to doxil, cycle 1 on 09-19-13  Review of systems as above, also: Swelling RLE unchanged, none on left.  Remainder of 10 point Review of Systems negative.  Objective:  Vital signs in last 24 hours:  BP 134/67  Pulse 75  Temp(Src) 98.3 F (36.8 C) (Oral)  Resp 20  Ht 5\' 1"  (1.549 m)  Wt 142 lb 3.2 oz (64.501 kg)  BMI 26.88 kg/m2 weight is up 5 lbs  Alert, oriented and appropriate. Ambulatory without assistance. Looks moderately uncomfortable but NAD, to BR several times to void. Alopecia  HEENT:PERRL, sclerae not icteric. Oral mucosa moist without thrush tongue and buccal mucosa, posterior pharynx clear.  Neck supple. No JVD.  Lymphatics:no cervical,suraclavicular, axillary or inguinal adenopathy Resp: clear to auscultation bilaterally and normal percussion bilaterally Cardio: regular rate and rhythm. No gallop. GI: soft, nontender, not distended, no mass or organomegaly. Normally active bowel sounds. Surgical incision not remarkable. Slight skin irritation around adhesive for ostomy, but no marked skin reaction. Musculoskeletal/ Extremities: tight swelling RLE without weeping, otherwise extremities without pitting edema, cords, tenderness. Palms mild erythema and minimal puffiness. Neuro: no change peripheral neuropathy. Otherwise nonfocal. PSYCH appropriate mood and affect. Skin otherwise without rash, ecchymosis, petechiae Portacath-without erythema or tenderness  Lab Results:  Results for orders placed  in visit on 01/30/14  CBC WITH DIFFERENTIAL      Result Value Ref Range   WBC  2.8 (*) 3.9 - 10.3 10e3/uL   NEUT# 1.7  1.5 - 6.5 10e3/uL   HGB 11.5 (*) 11.6 - 15.9 g/dL   HCT 35.2  34.8 - 46.6 %   Platelets 175  145 - 400 10e3/uL   MCV 96.7  79.5 - 101.0 fL   MCH 31.6  25.1 - 34.0 pg   MCHC 32.7  31.5 - 36.0 g/dL   RBC 3.64 (*) 3.70 - 5.45 10e6/uL   RDW 16.8 (*) 11.2 - 14.5 %   lymph# 0.6 (*) 0.9 - 3.3 10e3/uL   MONO# 0.5  0.1 - 0.9 10e3/uL   Eosinophils Absolute 0.0  0.0 - 0.5 10e3/uL   Basophils Absolute 0.0  0.0 - 0.1 10e3/uL   NEUT% 60.1  38.4 - 76.8 %   LYMPH% 20.5  14.0 - 49.7 %   MONO% 16.9 (*) 0.0 - 14.0 %   EOS% 1.4  0.0 - 7.0 %   BASO% 1.1  0.0 - 2.0 %  COMPREHENSIVE METABOLIC PANEL (JO84)      Result Value Ref Range   Sodium 142  136 - 145 mEq/L   Potassium 3.5  3.5 - 5.1 mEq/L   Chloride 104  98 - 109 mEq/L   CO2 29  22 - 29 mEq/L   Glucose 96  70 - 140 mg/dl   BUN 6.4 (*) 7.0 - 26.0 mg/dL   Creatinine 0.7  0.6 - 1.1 mg/dL   Total Bilirubin 0.31  0.20 - 1.20 mg/dL   Alkaline Phosphatase 81  40 - 150 U/L   AST 21  5 - 34 U/L   ALT 10  0 - 55 U/L   Total Protein 6.6  6.4 - 8.3 g/dL   Albumin 3.4 (*) 3.5 - 5.0 g/dL   Calcium 9.7  8.4 - 10.4 mg/dL   Anion Gap 9  3 - 11 mEq/L  URINALYSIS, MICROSCOPIC - [facility]      Result Value Ref Range   Glucose Negative  Negative mg/dL   Bilirubin (Urine) Negative  Negative   Ketones Negative  Negative mg/dL   Specific Gravity, Urine 1.015  1.003 - 1.035   Blood Large  Negative   pH 6.0  4.6 - 8.0   Protein 30  Negative- <30 mg/dL   Urobilinogen, UR 0.2  0.2 - 1 mg/dL   Nitrite Negative  Negative   Leukocyte Esterase Large  Negative   RBC / HPF 21-50  0 - 2   WBC, UA TNTC  0 - 2   Bacteria, UA Moderate  Negative- Trace   Epithelial Cells Occasional  Negative- Few   urine culture pending.  Studies/Results:  No results found.  Medications: I have reviewed the patient's current medications. Diflucan 100 mg daily thru this antibiotic course then a few more days if needed  DISCUSSION: will restage  with CT after cycle 6 doxil (last CT AP 08-22-13).  Assessment/Plan:  1. 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 Doxil begun 09-19-13, tolerating adequately, cycle 6 planned 02-06-14 as long as Berthold >=1.5 and plt >=100k. Continue good skin care and supplemental B6. Will repeat CT AP after 6 cycles of doxil, prior to my next visit; she has prn follow up now with Dr Josephina Shih.  Transportation easiest on Wednesdays. 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 Continue ditropan. For stent change in Dec. 4.post total colectomy for ulcerative colitis 1990, with ileostomy  5.multiple blood transfusions with ulcerative colitis previously, and during this chemo  6. PAC in  7.multiple UTIs, recurrent symptoms now. Plan as above. Vaginal and oral yeast, diflucan. 8.rash to PCN  9. Chemo related cytopenias: has not needed gCSF with doxil  10. Extremely sensitive to oral contrast with CTs causing diarrhea, needs water based oral contrast regimen in radiology. Hypokalemia: related to ileostomy output, generally manages with diet.  11.RLE swelling: no DVT by prior evaluations  12.Social stressors: elderly father died recently with lung cancer; patient's mother with multiple medical problems, staying alone with family assisting 79.does not have Advance Directives completed 14. Needs flu vaccine this fall, not with active infection and not on day of chemo.  Doxil orders entered.   Carnetta Losada P, MD   01/30/2014, 1:36 PM

## 2014-01-31 ENCOUNTER — Telehealth: Payer: Self-pay

## 2014-01-31 ENCOUNTER — Telehealth: Payer: Self-pay | Admitting: Oncology

## 2014-01-31 LAB — URINE CULTURE

## 2014-01-31 LAB — CA 125: CA 125: 1307 U/mL — ABNORMAL HIGH (ref <35)

## 2014-01-31 LAB — CA 125(PREVIOUS METHOD): CA 125: 937.2 U/mL — AB (ref 0.0–30.2)

## 2014-01-31 NOTE — Telephone Encounter (Signed)
Told Kirsten Schroeder that her disability  Form was completed and she can pick it up From Dr. Mariana Kaufman nurse when she comes for her treatment on 02-06-14. Confirmed date of disability as 01-06-13. Sent a copy of the form to HIM to be scanned into patient's EMR.

## 2014-02-01 ENCOUNTER — Telehealth: Payer: Self-pay

## 2014-02-01 NOTE — Telephone Encounter (Signed)
Told Kirsten Schroeder that the culture did not show UTI, however, Kirsten Schroeder said to continue the course of the cipro if she were feeling better.   Kirsten Schroeder stated that she did feel better with ATB though the did comfort has not completely resolved.

## 2014-02-06 ENCOUNTER — Ambulatory Visit: Payer: BC Managed Care – PPO | Admitting: Oncology

## 2014-02-06 ENCOUNTER — Other Ambulatory Visit: Payer: BC Managed Care – PPO

## 2014-02-06 ENCOUNTER — Other Ambulatory Visit: Payer: Self-pay | Admitting: Oncology

## 2014-02-06 ENCOUNTER — Ambulatory Visit (HOSPITAL_BASED_OUTPATIENT_CLINIC_OR_DEPARTMENT_OTHER): Payer: BC Managed Care – PPO

## 2014-02-06 ENCOUNTER — Other Ambulatory Visit (HOSPITAL_BASED_OUTPATIENT_CLINIC_OR_DEPARTMENT_OTHER): Payer: BC Managed Care – PPO

## 2014-02-06 VITALS — BP 141/70 | HR 80 | Temp 98.5°F | Resp 18

## 2014-02-06 DIAGNOSIS — C569 Malignant neoplasm of unspecified ovary: Secondary | ICD-10-CM

## 2014-02-06 DIAGNOSIS — C801 Malignant (primary) neoplasm, unspecified: Secondary | ICD-10-CM

## 2014-02-06 DIAGNOSIS — C778 Secondary and unspecified malignant neoplasm of lymph nodes of multiple regions: Secondary | ICD-10-CM

## 2014-02-06 DIAGNOSIS — Z5111 Encounter for antineoplastic chemotherapy: Secondary | ICD-10-CM

## 2014-02-06 LAB — COMPREHENSIVE METABOLIC PANEL (CC13)
ALT: 11 U/L (ref 0–55)
AST: 23 U/L (ref 5–34)
Albumin: 3.6 g/dL (ref 3.5–5.0)
Alkaline Phosphatase: 91 U/L (ref 40–150)
Anion Gap: 10 mEq/L (ref 3–11)
BILIRUBIN TOTAL: 0.38 mg/dL (ref 0.20–1.20)
BUN: 10.9 mg/dL (ref 7.0–26.0)
CO2: 28 mEq/L (ref 22–29)
CREATININE: 0.8 mg/dL (ref 0.6–1.1)
Calcium: 9.9 mg/dL (ref 8.4–10.4)
Chloride: 104 mEq/L (ref 98–109)
Glucose: 87 mg/dl (ref 70–140)
Potassium: 3.9 mEq/L (ref 3.5–5.1)
SODIUM: 143 meq/L (ref 136–145)
TOTAL PROTEIN: 6.7 g/dL (ref 6.4–8.3)

## 2014-02-06 LAB — CBC WITH DIFFERENTIAL/PLATELET
BASO%: 2.3 % — AB (ref 0.0–2.0)
Basophils Absolute: 0.1 10*3/uL (ref 0.0–0.1)
EOS%: 1.4 % (ref 0.0–7.0)
Eosinophils Absolute: 0 10*3/uL (ref 0.0–0.5)
HEMATOCRIT: 38 % (ref 34.8–46.6)
HGB: 12.4 g/dL (ref 11.6–15.9)
LYMPH#: 0.6 10*3/uL — AB (ref 0.9–3.3)
LYMPH%: 20.5 % (ref 14.0–49.7)
MCH: 31.6 pg (ref 25.1–34.0)
MCHC: 32.7 g/dL (ref 31.5–36.0)
MCV: 96.8 fL (ref 79.5–101.0)
MONO#: 0.4 10*3/uL (ref 0.1–0.9)
MONO%: 14.8 % — ABNORMAL HIGH (ref 0.0–14.0)
NEUT#: 1.6 10*3/uL (ref 1.5–6.5)
NEUT%: 61 % (ref 38.4–76.8)
Platelets: 216 10*3/uL (ref 145–400)
RBC: 3.93 10*6/uL (ref 3.70–5.45)
RDW: 18.3 % — ABNORMAL HIGH (ref 11.2–14.5)
WBC: 2.7 10*3/uL — AB (ref 3.9–10.3)

## 2014-02-06 MED ORDER — SODIUM CHLORIDE 0.9 % IV SOLN
Freq: Once | INTRAVENOUS | Status: AC
  Administered 2014-02-06: 14:00:00 via INTRAVENOUS

## 2014-02-06 MED ORDER — HEPARIN SOD (PORK) LOCK FLUSH 100 UNIT/ML IV SOLN
500.0000 [IU] | Freq: Once | INTRAVENOUS | Status: AC | PRN
  Administered 2014-02-06: 500 [IU]
  Filled 2014-02-06: qty 5

## 2014-02-06 MED ORDER — DEXAMETHASONE SODIUM PHOSPHATE 10 MG/ML IJ SOLN
10.0000 mg | Freq: Once | INTRAMUSCULAR | Status: AC
  Administered 2014-02-06: 10 mg via INTRAVENOUS

## 2014-02-06 MED ORDER — DOXORUBICIN HCL LIPOSOMAL CHEMO INJECTION 2 MG/ML
40.0000 mg/m2 | Freq: Once | INTRAVENOUS | Status: AC
  Administered 2014-02-06: 68 mg via INTRAVENOUS
  Filled 2014-02-06: qty 34

## 2014-02-06 MED ORDER — SODIUM CHLORIDE 0.9 % IJ SOLN
10.0000 mL | INTRAMUSCULAR | Status: DC | PRN
  Administered 2014-02-06: 10 mL
  Filled 2014-02-06: qty 10

## 2014-02-06 MED ORDER — ONDANSETRON 8 MG/50ML IVPB (CHCC)
8.0000 mg | Freq: Once | INTRAVENOUS | Status: AC
  Administered 2014-02-06: 8 mg via INTRAVENOUS

## 2014-02-06 MED ORDER — ONDANSETRON 8 MG/NS 50 ML IVPB
INTRAVENOUS | Status: AC
  Filled 2014-02-06: qty 8

## 2014-02-06 MED ORDER — DEXAMETHASONE SODIUM PHOSPHATE 10 MG/ML IJ SOLN
INTRAMUSCULAR | Status: AC
  Filled 2014-02-06: qty 1

## 2014-02-06 NOTE — Patient Instructions (Signed)
[facility] Discharge Instructions for Patients Receiving Chemotherapy  Today you received the following chemotherapy agents Doxil.  To help prevent nausea and vomiting after your treatment, we encourage you to take your nausea medication as prescribed.   If you develop nausea and vomiting that is not controlled by your nausea medication, call the clinic.   BELOW ARE SYMPTOMS THAT SHOULD BE REPORTED IMMEDIATELY:  *FEVER GREATER THAN 100.5 F  *CHILLS WITH OR WITHOUT FEVER  NAUSEA AND VOMITING THAT IS NOT CONTROLLED WITH YOUR NAUSEA MEDICATION  *UNUSUAL SHORTNESS OF BREATH  *UNUSUAL BRUISING OR BLEEDING  TENDERNESS IN MOUTH AND THROAT WITH OR WITHOUT PRESENCE OF ULCERS  *URINARY PROBLEMS  *BOWEL PROBLEMS  UNUSUAL RASH Items with * indicate a potential emergency and should be followed up as soon as possible.  Feel free to call the clinic you have any questions or concerns. The clinic phone number is [phone].    

## 2014-02-13 ENCOUNTER — Encounter: Payer: Self-pay | Admitting: *Deleted

## 2014-02-13 NOTE — Progress Notes (Signed)
[place] Work  Clinical Social Work was referred by Futures trader for home care resources.  Patient's mother currently lives at home alone and patient/family are interested in possible home care support per MD referral.  Clinical Social Worker attempted to contact patient at home, unable to reach patient- left voicemail to return call when convenient.   Polo Riley, MSW, LCSW, OSW-C Clinical Social Worker [facility] [phone]

## 2014-02-15 ENCOUNTER — Telehealth: Payer: Self-pay

## 2014-02-15 DIAGNOSIS — N390 Urinary tract infection, site not specified: Secondary | ICD-10-CM

## 2014-02-15 DIAGNOSIS — C569 Malignant neoplasm of unspecified ovary: Secondary | ICD-10-CM

## 2014-02-15 MED ORDER — CIPROFLOXACIN HCL 250 MG PO TABS: 250.0000 mg | ORAL_TABLET | Freq: Two times a day (BID) | ORAL | Status: DC

## 2014-02-15 NOTE — Telephone Encounter (Signed)
Ms. states that she is experiencing urinary burning and pain.  She would like an ATB sent to her pharmacy.  [name] would usually come in for a urine specimen ,however, she is currently at [facility] as her mother is dying.  She would like ATB to get her through today and funeral. Spoke with Kirsten John NP and she said to send Cipro 250 mg bid to her pharmacy for a 10 day course. Instructed Kirsten Schroeder to call office  or go to ED if symptoms become worse or she develops a temp of 101.5 or higher.

## 2014-02-18 NOTE — Telephone Encounter (Signed)
Ms. Randa Evens brought Disability report back from 01-30-14 so Dr. Marko Plume coud make a chang on disability being temporary instead of permanent. Faxed addend report to Ms. Greer at [phone].  Sent a copy to HIM to be scanned into patient's EMR.

## 2014-02-27 ENCOUNTER — Ambulatory Visit (HOSPITAL_COMMUNITY)
Admission: RE | Admit: 2014-02-27 | Discharge: 2014-02-27 | Disposition: A | Discharge status: Home / Self Care | Payer: BC Managed Care – PPO | Source: Ambulatory Visit | Attending: Oncology | Admitting: Oncology

## 2014-02-27 DIAGNOSIS — Z79899 Other long term (current) drug therapy: Secondary | ICD-10-CM | POA: Insufficient documentation

## 2014-02-27 DIAGNOSIS — C569 Malignant neoplasm of unspecified ovary: Secondary | ICD-10-CM

## 2014-02-27 MED ORDER — IOHEXOL 300 MG/ML  SOLN
50.0000 mL | Freq: Once | INTRAMUSCULAR | Status: AC | PRN
  Administered 2014-02-27: 50 mL via ORAL

## 2014-02-27 MED ORDER — IOHEXOL 300 MG/ML  SOLN
100.0000 mL | Freq: Once | INTRAMUSCULAR | Status: AC | PRN
  Administered 2014-02-27: 100 mL via INTRAVENOUS

## 2014-03-04 ENCOUNTER — Other Ambulatory Visit: Payer: Self-pay

## 2014-03-04 DIAGNOSIS — C569 Malignant neoplasm of unspecified ovary: Secondary | ICD-10-CM

## 2014-03-04 DIAGNOSIS — N1330 Unspecified hydronephrosis: Secondary | ICD-10-CM

## 2014-03-04 NOTE — Progress Notes (Signed)
Kirsten Schroeder called 03-04-14 and requested that a ua be done at 03-06-14 appointment to be sure that the UTI she had was gone. Ordered UA and C&S in addition to regularly scheduled lab work for visit 03-06-14.

## 2014-03-05 ENCOUNTER — Other Ambulatory Visit: Payer: Self-pay | Admitting: Oncology

## 2014-03-06 ENCOUNTER — Ambulatory Visit (HOSPITAL_BASED_OUTPATIENT_CLINIC_OR_DEPARTMENT_OTHER): Payer: BC Managed Care – PPO | Admitting: Oncology

## 2014-03-06 ENCOUNTER — Other Ambulatory Visit (HOSPITAL_BASED_OUTPATIENT_CLINIC_OR_DEPARTMENT_OTHER): Payer: BC Managed Care – PPO

## 2014-03-06 ENCOUNTER — Encounter: Payer: Self-pay | Admitting: Oncology

## 2014-03-06 VITALS — BP 131/73 | HR 75 | Temp 98.5°F | Resp 20 | Ht 61.0 in | Wt 142.4 lb

## 2014-03-06 DIAGNOSIS — E876 Hypokalemia: Secondary | ICD-10-CM

## 2014-03-06 DIAGNOSIS — G620 Drug-induced polyneuropathy: Secondary | ICD-10-CM

## 2014-03-06 DIAGNOSIS — R21 Rash and other nonspecific skin eruption: Secondary | ICD-10-CM

## 2014-03-06 DIAGNOSIS — C801 Malignant (primary) neoplasm, unspecified: Secondary | ICD-10-CM

## 2014-03-06 DIAGNOSIS — C569 Malignant neoplasm of unspecified ovary: Secondary | ICD-10-CM

## 2014-03-06 DIAGNOSIS — Z23 Encounter for immunization: Secondary | ICD-10-CM

## 2014-03-06 DIAGNOSIS — N1330 Unspecified hydronephrosis: Secondary | ICD-10-CM

## 2014-03-06 LAB — COMPREHENSIVE METABOLIC PANEL (CC13)
ALT: 19 U/L (ref 0–55)
AST: 25 U/L (ref 5–34)
Albumin: 3.4 g/dL — ABNORMAL LOW (ref 3.5–5.0)
Alkaline Phosphatase: 92 U/L (ref 40–150)
Anion Gap: 10 mEq/L (ref 3–11)
BILIRUBIN TOTAL: 0.38 mg/dL (ref 0.20–1.20)
BUN: 5.8 mg/dL — AB (ref 7.0–26.0)
CO2: 26 meq/L (ref 22–29)
Calcium: 10.1 mg/dL (ref 8.4–10.4)
Chloride: 107 mEq/L (ref 98–109)
Creatinine: 0.8 mg/dL (ref 0.6–1.1)
GLUCOSE: 87 mg/dL (ref 70–140)
Potassium: 3.6 mEq/L (ref 3.5–5.1)
SODIUM: 143 meq/L (ref 136–145)
Total Protein: 6.3 g/dL — ABNORMAL LOW (ref 6.4–8.3)

## 2014-03-06 LAB — CBC WITH DIFFERENTIAL/PLATELET
BASO%: 1.3 % (ref 0.0–2.0)
BASOS ABS: 0 10*3/uL (ref 0.0–0.1)
EOS ABS: 0 10*3/uL (ref 0.0–0.5)
EOS%: 1.2 % (ref 0.0–7.0)
HEMATOCRIT: 38.4 % (ref 34.8–46.6)
HEMOGLOBIN: 12.7 g/dL (ref 11.6–15.9)
LYMPH%: 20.7 % (ref 14.0–49.7)
MCH: 32.2 pg (ref 25.1–34.0)
MCHC: 33.1 g/dL (ref 31.5–36.0)
MCV: 97.1 fL (ref 79.5–101.0)
MONO#: 0.5 10*3/uL (ref 0.1–0.9)
MONO%: 16.5 % — AB (ref 0.0–14.0)
NEUT#: 1.9 10*3/uL (ref 1.5–6.5)
NEUT%: 60.3 % (ref 38.4–76.8)
Platelets: 198 10*3/uL (ref 145–400)
RBC: 3.96 10*6/uL (ref 3.70–5.45)
RDW: 16.7 % — ABNORMAL HIGH (ref 11.2–14.5)
WBC: 3.2 10*3/uL — AB (ref 3.9–10.3)
lymph#: 0.7 10*3/uL — ABNORMAL LOW (ref 0.9–3.3)

## 2014-03-06 LAB — URINALYSIS, MICROSCOPIC - CHCC
BILIRUBIN (URINE): NEGATIVE
GLUCOSE UR CHCC: NEGATIVE mg/dL
Ketones: NEGATIVE mg/dL
Nitrite: POSITIVE
Protein: 100 mg/dL
SPECIFIC GRAVITY, URINE: 1.015 (ref 1.003–1.035)
UROBILINOGEN UR: 0.2 mg/dL (ref 0.2–1)
pH: 6 (ref 4.6–8.0)

## 2014-03-06 MED ORDER — INFLUENZA VAC SPLIT QUAD 0.5 ML IM SUSY
0.5000 mL | PREFILLED_SYRINGE | Freq: Once | INTRAMUSCULAR | Status: AC
  Administered 2014-03-06: 0.5 mL via INTRAMUSCULAR
  Filled 2014-03-06: qty 0.5

## 2014-03-06 NOTE — Progress Notes (Signed)
OFFICE PROGRESS NOTE   03/06/2014   Physicians:ClarkePearson, Quillian Quince; Evalee Mutton, Marlene Bast, Lorriane Shire; Dahlstedt, Annie Main   INTERVAL HISTORY:  Patient is seen, together with daughter, in follow up of persistent gyn carcinoma thought ovarian, with restaging CT AP done 02-27-14 after 6 cycles of doxil from 09-19-13 thru 02-06-14. The scan shows probable slight increase in size of the complicated pelvic mass compared with April 2015, without new areas of disease identified. Patient had a more difficult time with cycle 6 doxil, particularly with fatigue, however had lost her father shortly prior to that treatment and mother was ill; her mother died 3 weeks ago.  She is to see Dr Diona Fanti on 03-22-14, next stent change ~ Dec. I will let him know about CT just done. She has prn follow up with Dr Josephina Shih, as no surgery possible.   She has had slight dysuria, no fever, no hematuria. Patient tells me that her appetite is improving and she does not have nausea now. Peripheral neuropathy in feet is better to the point that it is noticeable just in tips of toes when she walks for extended distances; this neuropathy previously had been much worse and required discontinuation of taxol. Colostomy is functioning as usual. She denies abdominal or pelvic discomfort. Most recent [place] stent has been much more comfortable than previous.   She has PAC and right JJ stent. She received flu vaccine today.   ONCOLOGIC HISTORY  Patient presented with RLE swelling negative for DVT July 2014, and right hydronephrosis. Biopsy of left inguinal node ([id number]) found carcinoma consistent with serous gyn primary.Neoadjuvant dose dense taxol carboplatin began 01-31-13, with improvement in pelvic mass and adenopathy by scans after 3 cycles.. She had 3 additional cycles, with progressive decrease in CA 125 from 5626 in 12-2012 to 596 by Jan 2015. CT 06-14-13 had ~ stable pelvic mass and adenopathy. Dr  Josephina Shih recommended another 3 cycles chemotherapy, regimen changed to q 3 week taxol carboplatin on 06-27-13, which unfortunately caused worsening of peripheral neuropathy and severe taxol aches.Taxotere was substituted for taxol due to neuropathy in feet for cycles 8 and 9, completed 08-08-13. CA 125 improved to low of 494 in Feb 2015, then 732 just prior to cycle 9. CT AP 08-22-13 had minimal improvement, still not adequate to consider debulking surgery. Regimen was changed to doxil, cycle 1 on 09-19-13   Review of systems as above, also: No fever or chills. Improved swelling in RLE, no pain. Breathing ok. Is able to sleep. Remainder of 10 point Review of Systems negative.  Objective:  Vital signs in last 24 hours:  BP 131/73  Pulse 75  Temp(Src) 98.5 F (36.9 C) (Oral)  Resp 20  Ht 5\' 1"  (1.549 m)  Wt 142 lb 6.4 oz (64.592 kg)  BMI 26.92 kg/m2 weight stable  Alert, oriented and appropriate. Ambulatory without difficulty. Looks generally comfortable. Alopecia  HEENT:PERRL, sclerae not icteric. Oral mucosa moist without lesions, posterior pharynx clear.  Neck supple. No JVD.  Lymphatics:no cervical,suraclavicular or inguinal adenopathy Resp: clear to auscultation bilaterally and normal percussion bilaterally Cardio: regular rate and rhythm. No gallop. GI: soft, nontender, not distended, no mass or organomegaly. Normally active bowel sounds. Surgical incision not remarkable. Musculoskeletal/ Extremities: UE and LLE without pitting edema, cords, tenderness; RLE with 1-2+ swelling especially lower leg, but no longer tight. No tenderness. Back nontender. Neuro: no peripheral neuropathy in hands; feet as above. Otherwise nonfocal. PSYCH appropriate mood and affect Skin without rash, ecchymosis, petechiae. Minimal erythema and minimal  puffiness palms, no desquamation, no other doxil rash. Portacath-without erythema or tenderness  Lab Results:  Results for orders placed in visit on  03/06/14  CBC WITH DIFFERENTIAL      Result Value Ref Range   WBC 3.2 (*) 3.9 - 10.3 10e3/uL   NEUT# 1.9  1.5 - 6.5 10e3/uL   HGB 12.7  11.6 - 15.9 g/dL   HCT 38.4  34.8 - 46.6 %   Platelets 198  145 - 400 10e3/uL   MCV 97.1  79.5 - 101.0 fL   MCH 32.2  25.1 - 34.0 pg   MCHC 33.1  31.5 - 36.0 g/dL   RBC 3.96  3.70 - 5.45 10e6/uL   RDW 16.7 (*) 11.2 - 14.5 %   lymph# 0.7 (*) 0.9 - 3.3 10e3/uL   MONO# 0.5  0.1 - 0.9 10e3/uL   Eosinophils Absolute 0.0  0.0 - 0.5 10e3/uL   Basophils Absolute 0.0  0.0 - 0.1 10e3/uL   NEUT% 60.3  38.4 - 76.8 %   LYMPH% 20.7  14.0 - 49.7 %   MONO% 16.5 (*) 0.0 - 14.0 %   EOS% 1.2  0.0 - 7.0 %   BASO% 1.3  0.0 - 2.0 %  COMPREHENSIVE METABOLIC PANEL (ZO10)      Result Value Ref Range   Sodium 143  136 - 145 mEq/L   Potassium 3.6  3.5 - 5.1 mEq/L   Chloride 107  98 - 109 mEq/L   CO2 26  22 - 29 mEq/L   Glucose 87  70 - 140 mg/dl   BUN 5.8 (*) 7.0 - 26.0 mg/dL   Creatinine 0.8  0.6 - 1.1 mg/dL   Total Bilirubin 0.38  0.20 - 1.20 mg/dL   Alkaline Phosphatase 92  40 - 150 U/L   AST 25  5 - 34 U/L   ALT 19  0 - 55 U/L   Total Protein 6.3 (*) 6.4 - 8.3 g/dL   Albumin 3.4 (*) 3.5 - 5.0 g/dL   Calcium 10.1  8.4 - 10.4 mg/dL   Anion Gap 10  3 - 11 mEq/L  URINALYSIS, MICROSCOPIC - [facility]      Result Value Ref Range   Glucose Negative  Negative mg/dL   Bilirubin (Urine) Negative  Negative   Ketones Negative  Negative mg/dL   Specific Gravity, Urine 1.015  1.003 - 1.035   Blood Large  Negative   pH 6.0  4.6 - 8.0   Protein 100  Negative- <30 mg/dL   Urobilinogen, UR 0.2  0.2 - 1 mg/dL   Nitrite Positive  Negative   Leukocyte Esterase Large  Negative   RBC / HPF Field Obscured by WBCs  0 - 2   WBC, UA TNTC  0 - 2   COMMENTS: FieldObscured by WBCs  Negative    Urine culture pending  CA 125 available after visit by new method 1624 as compared with 1307 on 01-30-14 by this method (by previous lab method, 1035 today and 937 on  01-30-14)  Studies/Results: CT ABDOMEN AND PELVIS WITH CONTRAST  02-27-14 TECHNIQUE:  Multidetector CT imaging of the abdomen and pelvis was performed  using the standard protocol following bolus administration of  intravenous contrast.  CONTRAST: 119mL OMNIPAQUE IOHEXOL 300 MG/ML SOLN  COMPARISON: CT of the abdomen and pelvis 08/22/2013.  FINDINGS:  Lower chest: Central venous catheter tip in the right atrium.  Minimal scarring or subsegmental atelectasis in the lower lungs  bilaterally.  Hepatobiliary: Status post  cholecystectomy. No focal hepatic  lesions. No intra or extrahepatic biliary ductal dilatation.  Pancreas: Unremarkable.  Spleen: Unremarkable.  Adrenals/Urinary Tract: Bilateral adrenal glands and the left kidney  are normal in appearance. A right-sided double-J ureteral stent is  noted, with the proximal loop properly reformed within the right  renal pelvis and the distal loop properly reformed within the lumen  of the urinary bladder. As with the prior examination, there is  extensive urothelial thickening and enhancement throughout the right  renal collecting system. Right renal collecting system remains  slightly dilated. Additionally, the pattern of delayed enhancement  in the right kidney is striated, which could suggest active  pyelonephritis. In addition, there is evidence of a developing  multifocal cortical thinning in the right kidney, suggesting  developing scarring.  Stomach/Bowel: The appearance of the stomach is normal. No  pathologic dilatation of the small bowel. 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  Vascular/Lymphatic: Mild atherosclerosis throughout the abdominal  and pelvic vasculature, without evidence of aneurysm or dissection.  No definite pathologically enlarged lymph nodes are noted in the  abdomen or pelvis.  Reproductive: Again  noted is a large mass in the central pelvis is  estimated to measure approximately 6.4 x 6.5 x 6.2 cm. This mass is  intimately associated with the superior aspect of the uterus and  involves the right adnexal region, where it is intimately associated  with the distal third of the right ureter. This mass is  heterogeneous in appearance with some internal areas that appear  slightly cystic or necrotic, with heterogeneous areas of internal  and peripheral enhancement.  Other: No significant volume of ascites. No pneumoperitoneum.  Musculoskeletal: There are no aggressive appearing lytic or blastic  lesions noted in the visualized portions of the skeleton.  IMPRESSION:  1. Interval enlargement of what is now a 6.4 x 6.5 x 6.2 cm  heterogeneously enhancing mass in the central pelvis with extension  into the right adnexal region, presumably a primary ovarian  neoplasm. This remains intimately associated with the distal third  of the right ureter.  2. The enhancement pattern of the right kidney could suggest active  pyelonephritis. There is chronic dilatation of the right renal  collecting system and avid urothelial enhancement, which may simply  be reactive to the chronically indwelling stent, but could also be  related to infection. In addition, there appears to be developing  areas of cortical thinning in the right kidney, suggesting areas of  developing scarring.  3. Status post total colectomy with right lower quadrant ileostomy  and large parastomal hernia. No associated bowel incarceration or  obstruction at this time.  4. Additional findings, as above.   Medications: I have reviewed the patient's current medications.  DISCUSSION We have discussed CT information as above and I have reviewed PACs images with them. The pelvic mass has increased slightly, tho this does not seem symptomatic, and no new distant disease. She understands that I will let Dr Josephina Shih know this  information. I have told her that she has had maximal benefit from these 6 cycles of doxil and have recommended change to another regimen. Nivolumab study likely would not have been appropriate due to ulcerative colitis history, but is also no longer available (see below). We could consider oral etoposide, topotecan, gemzar or CDDP/gem, AI or tamoxifen, or other (including Alimta possibljy later).  I prefer not to use  avastin due to extensive previous ulcerative colitis and those surgeries, tho may reconsider this later. I have told patient that I will ask Dr Josephina Shih if he has a specific preference now. We will be back in touch and set up further appointments from here.  I have discussed past surgeries for ulcerative colitis: total colectomy by Dr Lennie Hummer, initially without removal of rectum, then recurrent bleeding from rectum such that this was also removed in a separate surgery. I was particularly interested in this information in case she would be eligible for Nivolumab trial, however have learned from Research that this trial has already closed.   Assessment/Plan:  1. 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 Doxil x 6 cycles given from 09-19-13 thru 02-06-14. Slight progression in pelvic mass by scans now, no other new disease. Will let Dr Josephina Shih know and be back in touch with her with recommendation for next treatment regimen. Transportation easiest on Wednesdays.  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 Continue ditropan. For stent change in Dec.  4.post total colectomy for ulcerative colitis 1990, with ileostomy. She subsequently had rectum also removed per history. 5.multiple blood transfusions with ulcerative  colitis previously, and during this chemo  6. PAC in  7.multiple UTIs, questionable symptoms now. Follow up urine culture pending as UA difficult to interpret with stent 8.rash to PCN  9. Chemo related cytopenias: did not need gCSF with doxil  10. Extremely sensitive to oral contrast with CTs causing diarrhea, needs water based oral contrast regimen in radiology. Hypokalemia: related to ileostomy output, generally manages with diet.  11.RLE swelling: no DVT by prior evaluations  12.Social stressors: both parents have died in last ~ 2 months 13.does not have Advance Directives completed  14.flu vaccine given   All questions answered. Time spent 25 min including >50% counseling and coordination of care.    Teofila Bowery P, MD   03/06/2014, 10:42 AM

## 2014-03-07 ENCOUNTER — Other Ambulatory Visit: Payer: Self-pay | Admitting: Oncology

## 2014-03-07 LAB — CA 125(PREVIOUS METHOD): CA 125: 1035.6 U/mL — ABNORMAL HIGH (ref 0.0–30.2)

## 2014-03-07 LAB — URINE CULTURE

## 2014-03-07 LAB — CA 125: CA 125: 1624 U/mL — ABNORMAL HIGH (ref <35)

## 2014-03-07 NOTE — Progress Notes (Signed)
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

## 2014-03-08 ENCOUNTER — Telehealth: Payer: Self-pay

## 2014-03-08 DIAGNOSIS — C569 Malignant neoplasm of unspecified ovary: Secondary | ICD-10-CM

## 2014-03-08 MED ORDER — CIPROFLOXACIN HCL 250 MG PO TABS: 250.0000 mg | ORAL_TABLET | Freq: Two times a day (BID) | ORAL | Status: DC

## 2014-03-08 NOTE — Telephone Encounter (Signed)
Told Kirsten Schroeder the results of the urine culture as noted below by Dr. Marko Plume.  Kirsten Schroeder states that she is having increased pain in right kidney especially when she urinates. Discussed with Dr. Marko Plume.  Dr. Marko Plume will order 3 days of cipro.  If Kirsten Schroeder does not feel any improvement with pain, she is to call Dr. Diona Fanti.  Kirsten Schroeder verbalized understanding.

## 2014-03-08 NOTE — Telephone Encounter (Signed)
Message copied by Baruch Merl on Fri Mar 08, 2014  1:52 PM ------      Message from: Gordy Levan      Created: Fri Mar 08, 2014 11:45 AM       Labs seen and need follow up no clear urinary infection now, please let her know ------

## 2014-03-19 ENCOUNTER — Telehealth: Payer: Self-pay | Admitting: Oncology

## 2014-03-19 ENCOUNTER — Telehealth: Payer: Self-pay | Admitting: *Deleted

## 2014-03-19 ENCOUNTER — Other Ambulatory Visit: Payer: Self-pay | Admitting: Oncology

## 2014-03-19 DIAGNOSIS — C569 Malignant neoplasm of unspecified ovary: Secondary | ICD-10-CM

## 2014-03-19 DIAGNOSIS — C772 Secondary and unspecified malignant neoplasm of intra-abdominal lymph nodes: Secondary | ICD-10-CM

## 2014-03-19 NOTE — Telephone Encounter (Signed)
Per staff message and POF I have scheduled appts. Advised scheduler of appts. JMW  

## 2014-03-19 NOTE — Telephone Encounter (Signed)
s.w.l pt and advised on NOV appts....pt ok and aware....no available appts on 11.11

## 2014-03-19 NOTE — Telephone Encounter (Signed)
Message copied by Christa See on Tue Mar 19, 2014  3:08 PM ------      Message from: Gordy Levan      Created: Tue Mar 19, 2014 11:09 AM      Regarding: RE: POF for Gemzar       POF done, orders in, prior auth requested      Please make sure she has enough antiemetics      Thank you      ----- Message -----         From: Christa See, RN         Sent: 03/19/2014  10:50 AM           To: Gordy Levan, MD      Subject: POF for Gemzar                                           Will you please place POF for starting gemzar on 03/27/14? Will get appt from [facility] so I can call her with it at the same time I do gemzar teaching. Thanks! Yarielys Beed        ------

## 2014-03-19 NOTE — Telephone Encounter (Signed)
Attempted to reach pt by phone several times to let her know about future appointment times and to do gemzar teaching - got busy signal each time. Will try reaching her again tomorrow.

## 2014-03-20 MED ORDER — ONDANSETRON HCL 8 MG PO TABS: 8.0000 mg | ORAL_TABLET | Freq: Two times a day (BID) | ORAL | Status: DC | PRN

## 2014-03-20 NOTE — Addendum Note (Signed)
Addended by: Christa See on: 03/20/2014 11:30 AM   Modules accepted: Orders

## 2014-03-20 NOTE — Telephone Encounter (Addendum)
Reached pt by phone today. Did gemzar teaching and let pt know that Dr. Marko Plume would like her to take 325mg  Tylenol before her treatment and again in the evening after it as feverish symptoms can occur with this drug. Reviewed appts with pt and she wrote them down and is agreeable to come at scheduled times. Pt states she has plenty of ativan but does need a refill on the zofran tablets. Script sent to her pharmacy. Told pt to please call us with any questions prior to starting this chemotherapy next week. Pt agreeable to this.

## 2014-03-27 ENCOUNTER — Other Ambulatory Visit (HOSPITAL_BASED_OUTPATIENT_CLINIC_OR_DEPARTMENT_OTHER): Payer: BC Managed Care – PPO

## 2014-03-27 ENCOUNTER — Ambulatory Visit: Payer: BC Managed Care – PPO

## 2014-03-27 DIAGNOSIS — C569 Malignant neoplasm of unspecified ovary: Secondary | ICD-10-CM

## 2014-03-27 DIAGNOSIS — C801 Malignant (primary) neoplasm, unspecified: Secondary | ICD-10-CM

## 2014-03-27 DIAGNOSIS — E876 Hypokalemia: Secondary | ICD-10-CM

## 2014-03-27 LAB — COMPREHENSIVE METABOLIC PANEL (CC13)
ALBUMIN: 3.6 g/dL (ref 3.5–5.0)
ALK PHOS: 109 U/L (ref 40–150)
ALT: 15 U/L (ref 0–55)
AST: 25 U/L (ref 5–34)
Anion Gap: 9 mEq/L (ref 3–11)
BILIRUBIN TOTAL: 0.58 mg/dL (ref 0.20–1.20)
BUN: 11.2 mg/dL (ref 7.0–26.0)
CO2: 31 mEq/L — ABNORMAL HIGH (ref 22–29)
Calcium: 9.9 mg/dL (ref 8.4–10.4)
Chloride: 102 mEq/L (ref 98–109)
Creatinine: 0.9 mg/dL (ref 0.6–1.1)
Glucose: 98 mg/dl (ref 70–140)
POTASSIUM: 4 meq/L (ref 3.5–5.1)
SODIUM: 141 meq/L (ref 136–145)
TOTAL PROTEIN: 6.3 g/dL — AB (ref 6.4–8.3)

## 2014-03-27 LAB — CBC WITH DIFFERENTIAL/PLATELET
BASO%: 0.6 % (ref 0.0–2.0)
Basophils Absolute: 0 10*3/uL (ref 0.0–0.1)
EOS%: 2.8 % (ref 0.0–7.0)
Eosinophils Absolute: 0.1 10*3/uL (ref 0.0–0.5)
HCT: 39 % (ref 34.8–46.6)
HGB: 12.8 g/dL (ref 11.6–15.9)
LYMPH%: 17.8 % (ref 14.0–49.7)
MCH: 31.6 pg (ref 25.1–34.0)
MCHC: 32.8 g/dL (ref 31.5–36.0)
MCV: 96.4 fL (ref 79.5–101.0)
MONO#: 0.6 10*3/uL (ref 0.1–0.9)
MONO%: 11.1 % (ref 0.0–14.0)
NEUT#: 3.5 10*3/uL (ref 1.5–6.5)
NEUT%: 67.7 % (ref 38.4–76.8)
PLATELETS: 191 10*3/uL (ref 145–400)
RBC: 4.05 10*6/uL (ref 3.70–5.45)
RDW: 15 % — AB (ref 11.2–14.5)
WBC: 5.2 10*3/uL (ref 3.9–10.3)
lymph#: 0.9 10*3/uL (ref 0.9–3.3)

## 2014-03-27 NOTE — Progress Notes (Unsigned)
While doing pt's assessment before chemo, patient states she received a call last evening from her physician,  Dr. Diona Fanti that her urine culture was positive and he would call Leviquin to her pharmacy to take daily for 7 days.  Pt staes she has not picked up the medication yet.  Dr. Marko Plume notified of this and has rescheduled pt's chemo for today till Friday 03/29/14.  Pt understands she needs to pick up antibiotic and start it today.

## 2014-03-28 ENCOUNTER — Ambulatory Visit (INDEPENDENT_AMBULATORY_CARE_PROVIDER_SITE_OTHER): Payer: BC Managed Care – PPO | Admitting: Family Medicine

## 2014-03-28 ENCOUNTER — Encounter: Payer: Self-pay | Admitting: Family Medicine

## 2014-03-28 VITALS — BP 128/74 | HR 83 | Temp 98.5°F | Wt 140.8 lb

## 2014-03-28 DIAGNOSIS — C569 Malignant neoplasm of unspecified ovary: Secondary | ICD-10-CM

## 2014-03-28 LAB — CA 125: CA 125: 1932 U/mL — ABNORMAL HIGH (ref <35)

## 2014-03-28 NOTE — Progress Notes (Signed)
Pre visit review using our clinic review tool, if applicable. No additional management support is needed unless otherwise documented below in the visit note. 

## 2014-03-28 NOTE — Progress Notes (Signed)
Subjective:    Patient ID: Kirsten Schroeder, female    DOB: March 14, 1956, 58 y.o.   MRN: [id number]  HPI Pt is here with her son to have her disability paperwork filled out.  They would not except it from the oncologist.  Pt is going through chemo right now.    Review of Systems As above  Past Medical History  Diagnosis Date  . Ulcerative colitis   . Hydronephrosis, right   . Ejection fraction < 50%     56%  AND NORMAL WALL MOTION --  PER  [facility]  09-06-2013  . 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 Wears glasses   . Hypertension     PT CURRENT NOT TAKING BP MEDICATION PER PCP SINCE BP IS LOW SECONDARY TO CHEMO  . 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 Bone metastasis     MILD ---  SECONDARY OVARIAN CANCER   Current Outpatient Prescriptions  Medication Sig Dispense Refill  . acyclovir (ZOVIRAX) 200 MG capsule Take 1 capsule (200 mg total) by mouth 3 (three) times daily as needed. For fever blister 30 capsule 1  . Calcium Carbonate-Vitamin D (CALCIUM + D PO) Take 1 tablet by mouth daily.     . Fe Fum-FA-B Cmp-C-Zn-Mg-Mn-Cu (FERROCITE PLUS) 106-1 MG TABS Take 1 tablet by mouth daily. 30 each 5  . HYDROcodone-acetaminophen (NORCO/VICODIN) 5-325 MG per tablet Take 1 tablet every 4-6 hours as needed for pain 30 tablet 0  . levofloxacin (LEVAQUIN) 250 MG tablet Take 1 tablet by mouth daily.    Marland Kitchen lidocaine-prilocaine (EMLA) cream Apply topically as needed. Apply 1-2 hours prior to Porta-cath access. 30 g 2  . LORazepam (ATIVAN) 0.5 MG tablet Take 1 tablet (0.5 mg total) by mouth every 6 (six) hours as needed (for nausea). 30 tablet 0  . Multiple Vitamin (MULTIVITAMIN WITH MINERALS) TABS tablet Take 1 tablet by mouth daily.    . ondansetron (ZOFRAN) 8 MG tablet Take 1-2 tablets (8-16 mg total) by mouth every 12 (twelve) hours as needed for nausea. 30 tablet 2  . oxybutynin (DITROPAN) 5 MG tablet Take 1  tablet (5 mg total) by mouth 3 (three) times daily. 90 tablet 3  . oxymetazoline (AFRIN) 0.05 % nasal spray Place into the nose as needed for congestion (Helps stop nosebleed if it starts).    . pyridOXINE (VITAMIN B-6) 50 MG tablet Take 2 tabs in am and 1 in evening  for Doxil hand/foot syndrome. 100 tablet 3  . sodium chloride (OCEAN) 0.65 % nasal spray Place 1 spray into the nose as needed. Use every 1-3 hours while awake to keep nose moist, which helps to prevent nosebleeds     No current facility-administered medications for this visit.   Facility-Administered Medications Ordered in Other Visits  Medication Dose Route Frequency Provider Last Rate Last Dose  . sodium chloride 0.9 % injection 10 mL  10 mL Intravenous PRN Gordy Levan, MD   10 mL at 02/19/13 5329   Past Surgical History  Procedure Laterality Date  . Cystoscopy w/ ureteral stent placement Right 01/12/2013    Procedure: CYSTOSCOPY WITH RETROGRADE PYELOGRAM/ RIGHT DOUBLE J STENT PLACEMENT;  Surgeon: Franchot Gallo, MD;  Location: [facility];  Service: Urology;  Laterality: Right;  . Cesarean section    . Cholecystectomy  1992  . Total colectomy w/ permanent ileostomy  1990  (2 PART  SURGERY)    SEVERE UC  . Cystoscopy w/ ureteral stent placement Right 09/13/2013    Procedure: CYSTOSCOPY WITH STENT REPLACEMENT;  Surgeon: Franchot Gallo, MD;  Location: [facility];  Service: Urology;  Laterality: Right;       Objective:   Physical Exam  BP 128/74 mmHg  Pulse 83  Temp(Src) 98.5 F (36.9 C) (Oral)  Wt 140 lb 12.8 oz (63.866 kg)  SpO2 95% General appearance: alert, cooperative, appears stated age and no distress Neurologic: Grossly normal      Assessment & Plan:

## 2014-03-28 NOTE — Assessment & Plan Note (Signed)
Papers filled out and scanned in F/u per oncology rto prn

## 2014-03-29 ENCOUNTER — Ambulatory Visit (HOSPITAL_BASED_OUTPATIENT_CLINIC_OR_DEPARTMENT_OTHER): Payer: BC Managed Care – PPO

## 2014-03-29 DIAGNOSIS — C801 Malignant (primary) neoplasm, unspecified: Secondary | ICD-10-CM

## 2014-03-29 DIAGNOSIS — Z5111 Encounter for antineoplastic chemotherapy: Secondary | ICD-10-CM

## 2014-03-29 DIAGNOSIS — C569 Malignant neoplasm of unspecified ovary: Secondary | ICD-10-CM

## 2014-03-29 DIAGNOSIS — C778 Secondary and unspecified malignant neoplasm of lymph nodes of multiple regions: Secondary | ICD-10-CM

## 2014-03-29 MED ORDER — ONDANSETRON 8 MG/50ML IVPB (CHCC)
8.0000 mg | Freq: Once | INTRAVENOUS | Status: AC
  Administered 2014-03-29: 8 mg via INTRAVENOUS

## 2014-03-29 MED ORDER — SODIUM CHLORIDE 0.9 % IJ SOLN
10.0000 mL | INTRAMUSCULAR | Status: DC | PRN
  Administered 2014-03-29: 10 mL
  Filled 2014-03-29: qty 10

## 2014-03-29 MED ORDER — SODIUM CHLORIDE 0.9 % IV SOLN
800.0000 mg/m2 | Freq: Once | INTRAVENOUS | Status: AC
  Administered 2014-03-29: 1330 mg via INTRAVENOUS
  Filled 2014-03-29: qty 34.98

## 2014-03-29 MED ORDER — ACETAMINOPHEN 325 MG PO TABS
ORAL_TABLET | ORAL | Status: AC
  Filled 2014-03-29: qty 1

## 2014-03-29 MED ORDER — SODIUM CHLORIDE 0.9 % IV SOLN
Freq: Once | INTRAVENOUS | Status: AC
  Administered 2014-03-29: 13:00:00 via INTRAVENOUS

## 2014-03-29 MED ORDER — HEPARIN SOD (PORK) LOCK FLUSH 100 UNIT/ML IV SOLN
500.0000 [IU] | Freq: Once | INTRAVENOUS | Status: AC | PRN
  Administered 2014-03-29: 500 [IU]
  Filled 2014-03-29: qty 5

## 2014-03-29 MED ORDER — ACETAMINOPHEN 325 MG PO TABS
325.0000 mg | ORAL_TABLET | Freq: Once | ORAL | Status: AC
  Administered 2014-03-29: 325 mg via ORAL

## 2014-03-29 MED ORDER — ONDANSETRON 8 MG/NS 50 ML IVPB
INTRAVENOUS | Status: AC
  Filled 2014-03-29: qty 8

## 2014-03-29 NOTE — Patient Instructions (Signed)
[facility] Discharge Instructions for Patients Receiving Chemotherapy  Today you received the following chemotherapy agents: Gemzar  To help prevent nausea and vomiting after your treatment, we encourage you to take your nausea medication as prescribed by your physician.   If you develop nausea and vomiting that is not controlled by your nausea medication, call the clinic.   BELOW ARE SYMPTOMS THAT SHOULD BE REPORTED IMMEDIATELY:  *FEVER GREATER THAN 100.5 F  *CHILLS WITH OR WITHOUT FEVER  NAUSEA AND VOMITING THAT IS NOT CONTROLLED WITH YOUR NAUSEA MEDICATION  *UNUSUAL SHORTNESS OF BREATH  *UNUSUAL BRUISING OR BLEEDING  TENDERNESS IN MOUTH AND THROAT WITH OR WITHOUT PRESENCE OF ULCERS  *URINARY PROBLEMS  *BOWEL PROBLEMS  UNUSUAL RASH Items with * indicate a potential emergency and should be followed up as soon as possible.  Feel free to call the clinic you have any questions or concerns. The clinic phone number is [phone].  Gemcitabine injection (Gemzar) What is this medicine? GEMCITABINE (jem SIT a been) is a chemotherapy drug. This medicine is used to treat many types of cancer like breast cancer, lung cancer, pancreatic cancer, and ovarian cancer. This medicine may be used for other purposes; ask your health care provider or pharmacist if you have questions. COMMON BRAND NAME(S): Gemzar What should I tell my health care provider before I take this medicine? They need to know if you have any of these conditions: -blood disorders -infection -kidney disease -liver disease -recent or ongoing radiation therapy -an unusual or allergic reaction to gemcitabine, other chemotherapy, other medicines, foods, dyes, or preservatives -pregnant or trying to get pregnant -breast-feeding How should I use this medicine? This drug is given as an infusion into a vein. It is administered in a hospital or clinic by a specially trained health care professional. Talk  to your pediatrician regarding the use of this medicine in children. Special care may be needed. Overdosage: If you think you have taken too much of this medicine contact a poison control center or emergency room at once. NOTE: This medicine is only for you. Do not share this medicine with others. What if I miss a dose? It is important not to miss your dose. Call your doctor or health care professional if you are unable to keep an appointment. What may interact with this medicine? -medicines to increase blood counts like filgrastim, pegfilgrastim, sargramostim -some other chemotherapy drugs like cisplatin -vaccines Talk to your doctor or health care professional before taking any of these medicines: -acetaminophen -aspirin -ibuprofen -ketoprofen -naproxen This list may not describe all possible interactions. Give your health care provider a list of all the medicines, herbs, non-prescription drugs, or dietary supplements you use. Also tell them if you smoke, drink alcohol, or use illegal drugs. Some items may interact with your medicine. What should I watch for while using this medicine? Visit your doctor for checks on your progress. This drug may make you feel generally unwell. This is not uncommon, as chemotherapy can affect healthy cells as well as cancer cells. Report any side effects. Continue your course of treatment even though you feel ill unless your doctor tells you to stop. In some cases, you may be given additional medicines to help with side effects. Follow all directions for their use. Call your doctor or health care professional for advice if you get a fever, chills or sore throat, or other symptoms of a cold or flu. Do not treat yourself. This drug decreases your body's ability to fight  infections. Try to avoid being around people who are sick. This medicine may increase your risk to bruise or bleed. Call your doctor or health care professional if you notice any unusual  bleeding. Be careful brushing and flossing your teeth or using a toothpick because you may get an infection or bleed more easily. If you have any dental work done, tell your dentist you are receiving this medicine. Avoid taking products that contain aspirin, acetaminophen, ibuprofen, naproxen, or ketoprofen unless instructed by your doctor. These medicines may hide a fever. Women should inform their doctor if they wish to become pregnant or think they might be pregnant. There is a potential for serious side effects to an unborn child. Talk to your health care professional or pharmacist for more information. Do not breast-feed an infant while taking this medicine. What side effects may I notice from receiving this medicine? Side effects that you should report to your doctor or health care professional as soon as possible: -allergic reactions like skin rash, itching or hives, swelling of the face, lips, or tongue -low blood counts - this medicine may decrease the number of white blood cells, red blood cells and platelets. You may be at increased risk for infections and bleeding. -signs of infection - fever or chills, cough, sore throat, pain or difficulty passing urine -signs of decreased platelets or bleeding - bruising, pinpoint red spots on the skin, black, tarry stools, blood in the urine -signs of decreased red blood cells - unusually weak or tired, fainting spells, lightheadedness -breathing problems -chest pain -mouth sores -nausea and vomiting -pain, swelling, redness at site where injected -pain, tingling, numbness in the hands or feet -stomach pain -swelling of ankles, feet, hands -unusual bleeding Side effects that usually do not require medical attention (report to your doctor or health care professional if they continue or are bothersome): -constipation -diarrhea -hair loss -loss of appetite -stomach upset This list may not describe all possible side effects. Call your doctor for  medical advice about side effects. You may report side effects to FDA at 1-800-FDA-1088. Where should I keep my medicine? This drug is given in a hospital or clinic and will not be stored at home. NOTE: This sheet is a summary. It may not cover all possible information. If you have questions about this medicine, talk to your doctor, pharmacist, or health care provider.  2015, Elsevier/Gold Standard. (2007-09-19 18:45:54)

## 2014-03-29 NOTE — Progress Notes (Signed)
Patient tolerated Gemzar infusion without difficulty.

## 2014-03-31 ENCOUNTER — Other Ambulatory Visit: Payer: Self-pay | Admitting: Oncology

## 2014-03-31 DIAGNOSIS — C569 Malignant neoplasm of unspecified ovary: Secondary | ICD-10-CM

## 2014-04-01 ENCOUNTER — Encounter: Payer: Self-pay | Admitting: Oncology

## 2014-04-01 ENCOUNTER — Encounter: Payer: Self-pay | Admitting: *Deleted

## 2014-04-01 ENCOUNTER — Telehealth: Payer: Self-pay | Admitting: *Deleted

## 2014-04-01 NOTE — Telephone Encounter (Signed)
NO PROBLEMS OR QUESTIONS AT THIS TIME. PT. WILL CALL THIS OFFICE IF THE NEED ARISES. REMINDED PT. THERE IS AN ON CALL PHYSICIAN 24/7.

## 2014-04-01 NOTE — Progress Notes (Signed)
RECEIVED A FAX FROM [facility] CONCERNING A PRIOR AUTHORIZATION FOR ONDANSETRON. THIS REQUEST WAS PLACED IN THE MANAGED CARE BIN. 

## 2014-04-01 NOTE — Progress Notes (Signed)
[facility] [employer], [id number], approved ondansetron from 02/26/14-04/01/15

## 2014-04-03 ENCOUNTER — Encounter: Payer: Self-pay | Admitting: Oncology

## 2014-04-03 ENCOUNTER — Telehealth: Payer: Self-pay | Admitting: Oncology

## 2014-04-03 ENCOUNTER — Ambulatory Visit (HOSPITAL_BASED_OUTPATIENT_CLINIC_OR_DEPARTMENT_OTHER): Payer: BC Managed Care – PPO | Admitting: Oncology

## 2014-04-03 ENCOUNTER — Other Ambulatory Visit (HOSPITAL_BASED_OUTPATIENT_CLINIC_OR_DEPARTMENT_OTHER): Payer: BC Managed Care – PPO

## 2014-04-03 VITALS — BP 143/77 | HR 69 | Temp 98.0°F | Resp 18 | Ht 61.0 in | Wt 139.8 lb

## 2014-04-03 DIAGNOSIS — D6959 Other secondary thrombocytopenia: Secondary | ICD-10-CM

## 2014-04-03 DIAGNOSIS — N390 Urinary tract infection, site not specified: Secondary | ICD-10-CM

## 2014-04-03 DIAGNOSIS — C569 Malignant neoplasm of unspecified ovary: Secondary | ICD-10-CM

## 2014-04-03 DIAGNOSIS — C801 Malignant (primary) neoplasm, unspecified: Secondary | ICD-10-CM

## 2014-04-03 DIAGNOSIS — C772 Secondary and unspecified malignant neoplasm of intra-abdominal lymph nodes: Secondary | ICD-10-CM

## 2014-04-03 LAB — CBC WITH DIFFERENTIAL/PLATELET
BASO%: 0.7 % (ref 0.0–2.0)
BASOS ABS: 0 10*3/uL (ref 0.0–0.1)
EOS%: 2.2 % (ref 0.0–7.0)
Eosinophils Absolute: 0.1 10*3/uL (ref 0.0–0.5)
HCT: 36.9 % (ref 34.8–46.6)
HEMOGLOBIN: 12.1 g/dL (ref 11.6–15.9)
LYMPH#: 0.5 10*3/uL — AB (ref 0.9–3.3)
LYMPH%: 11.8 % — ABNORMAL LOW (ref 14.0–49.7)
MCH: 31.5 pg (ref 25.1–34.0)
MCHC: 32.9 g/dL (ref 31.5–36.0)
MCV: 95.9 fL (ref 79.5–101.0)
MONO#: 0.1 10*3/uL (ref 0.1–0.9)
MONO%: 1.7 % (ref 0.0–14.0)
NEUT%: 83.6 % — ABNORMAL HIGH (ref 38.4–76.8)
NEUTROS ABS: 3.6 10*3/uL (ref 1.5–6.5)
Platelets: 137 10*3/uL — ABNORMAL LOW (ref 145–400)
RBC: 3.85 10*6/uL (ref 3.70–5.45)
RDW: 14.7 % — AB (ref 11.2–14.5)
WBC: 4.3 10*3/uL (ref 3.9–10.3)

## 2014-04-03 MED ORDER — HYDROCODONE-ACETAMINOPHEN 5-325 MG PO TABS: ORAL_TABLET | ORAL | Status: DC

## 2014-04-03 NOTE — Progress Notes (Signed)
OFFICE PROGRESS NOTE   04/03/2014   Physicians:ClarkePearson, Quillian Quince; Evalee Mutton, Marlene Bast, Floy Sabina, Annie Main  INTERVAL HISTORY:  Patient is seen, together with daughter, having begun Gemzar on 03-29-14 for progressive gyn carcinoma thought ovarian primary. First gemzar was delayed due to UTI, levaquin by Dr Diona Fanti now completed and those symptoms resolved. Dr Josephina Shih is aware of situation and would be in agreement with addition of CDDP depending on how she tolerates gemzar.  Patient was fatigued on day 3, but did not have fever or flu symptoms, and has not had nausea. Energy now is back to recent baseline. She has had no increase in abdominal or pelvic discomfort. Bowels are moving.  She has PAC Right JJ stent may not need change until spring per Dr Diona Fanti Flu vaccine done.  ONCOLOGIC HISTORY Patient presented with RLE swelling negative for DVT July 2014, and right hydronephrosis. 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 She had 3 additional cycles, with progressive decrease in CA 125 from 5626 in 12-2012 to 596 by Jan 2015. CT 06-14-13 had ~ stable pelvic mass and adenopathy. Dr Josephina Shih recommended another 3 cycles chemotherapy, regimen changed to q 3 week taxol carboplatin on 06-27-13, which unfortunately caused worsening of peripheral neuropathy and severe taxol aches.Taxotere was substituted for taxol due to neuropathy in feet for cycles 8 and 9, completed 08-08-13. CA 125 improved to low of 494 in Feb 2015, then 732 just prior to cycle 9. CT AP 08-22-13 had minimal improvement, still not adequate to consider debulking surgery. She received doxil for 6 cycles, from 09-19-13 thru 02-06-14. CT AP 02-27-14 had some early increase in central pelvic mass, with treatment changed to  gemzar beginning 03-29-14.   Review of systems as above, also: No SOB, no bleeding, no problems with PAC. Minimal peripheral neuropathy symptoms in hands now. Skin dryness and erythema/ tenderness palms/soles from doxil is essentially resolved. Remainder of 10 point Review of Systems negative.  Objective:  Vital signs in last 24 hours:  BP 143/77 mmHg  Pulse 69  Temp(Src) 98 F (36.7 C) (Oral)  Resp 18  Ht 5\' 1"  (1.549 m)  Wt 139 lb 12.8 oz (63.413 kg)  BMI 26.43 kg/m2 weight is stable  Alert, oriented and appropriate. Ambulatory without difficulty.  Alopecia  HEENT:PERRL, sclerae not icteric. Oral mucosa moist without lesions, posterior pharynx clear.  Neck supple. No JVD.  Lymphatics:no cervical,supraclavicular, axillary or inguinal adenopathy Resp: clear to auscultation bilaterally and normal percussion bilaterally Cardio: regular rate and rhythm. No gallop. GI: soft, nontender, not distended, no mass or organomegaly. Normally active bowel sounds. Colostomy. Musculoskeletal/ Extremities: No pitting edema, cords, tenderness LLE; nearly tight swelling right lower leg unchanged, no cords or different tenderness Neuro: minimal peripheral neuropathy. Otherwise nonfocal. PSYCH appropriate mood and affect Skin without rash, ecchymosis, petechiae, Palms not remarkable, no puffiness fingers, generally much less dry. Portacath-without erythema or tenderness  Lab Results:  Results for orders placed or performed in visit on 04/03/14  CBC with Differential  Result Value Ref Range   WBC 4.3 3.9 - 10.3 10e3/uL   NEUT# 3.6 1.5 - 6.5 10e3/uL   HGB 12.1 11.6 - 15.9 g/dL   HCT 36.9 34.8 - 46.6 %   Platelets 137 (L) 145 - 400 10e3/uL   MCV 95.9 79.5 - 101.0 fL   MCH 31.5 25.1 - 34.0 pg  MCHC 32.9 31.5 - 36.0 g/dL   RBC 3.85 3.70 - 5.45 10e6/uL   RDW 14.7 (H) 11.2 - 14.5 %   lymph# 0.5 (L) 0.9 - 3.3 10e3/uL   MONO# 0.1 0.1 - 0.9 10e3/uL   Eosinophils Absolute 0.1 0.0 - 0.5 10e3/uL    Basophils Absolute 0.0 0.0 - 0.1 10e3/uL   NEUT% 83.6 (H) 38.4 - 76.8 %   LYMPH% 11.8 (L) 14.0 - 49.7 %   MONO% 1.7 0.0 - 14.0 %   EOS% 2.2 0.0 - 7.0 %   BASO% 0.7 0.0 - 2.0 %    Baseline CA 125 1932 by new method on 03-27-14, this having been 1624 on 03-06-14  Studies/Results:  No results found.  Medications: I have reviewed the patient's current medications. She prefers to take own tylenol prior to coming to office for gemzar treatment, instead of receiving this with premeds here  DISCUSSION: discussed possibly adding CDDP to gemzar depending on tolerance and response; she has not had teaching for CDDP and I did not go into detail about this today. She is aware that Dr Josephina Shih has been updated.  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 Doxil x 6 cycles given from 09-19-13 thru 02-06-14. Slight progression in pelvic mass by scans now, no other new disease. She will have second gemzar on 04-10-14 as long as [place] >=1.5 and plt >=100k. I will see her back ~ 12-2. Transportation easiest on Wednesdays.  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 Continue ditropan. Recent UTI symptoms resolved with levaquin (culture + at his office per patient)  4.post total colectomy for ulcerative colitis 1990, with ileostomy. She subsequently had rectum also removed per history. 5.multiple blood transfusions with ulcerative colitis previously, and during this chemo  6. PAC in  7.skin effects from doxil resolved 8.rash to PCN  9. Chemo related cytopenias: did not need gCSF with doxil and counts ok today 10. Extremely sensitive to oral contrast with CTs causing diarrhea, needs water based oral contrast regimen in  radiology. Hypokalemia: related to ileostomy output, generally manages with diet.  11.RLE swelling: no DVT by prior evaluations  12.Social stressors: both parents have died in last ~ 2 months 13.does not have Advance Directives completed per EMR 14.flu vaccine done   Chemo orders confirmed, premed tylenol removed. Patient knows to call prior to next scheduled visit if needed.   [name],LENNIS P, MD   04/03/2014, 11:32 AM

## 2014-04-03 NOTE — Telephone Encounter (Signed)
per pof to sch pt appt-gave pt copy of sch-sent MW email to sch pt trmt

## 2014-04-04 ENCOUNTER — Telehealth: Payer: Self-pay | Admitting: *Deleted

## 2014-04-04 NOTE — Telephone Encounter (Signed)
Per staff message and POF I have scheduled appts. Advised scheduler of appts. JMW  

## 2014-04-05 ENCOUNTER — Telehealth: Payer: Self-pay | Admitting: Oncology

## 2014-04-05 NOTE — Telephone Encounter (Signed)
schedule change - moved 12/2 appts to 12/3. s/w pt she is aware and will get new schedule 11/18.

## 2014-04-08 ENCOUNTER — Other Ambulatory Visit: Payer: BC Managed Care – PPO

## 2014-04-08 ENCOUNTER — Ambulatory Visit: Payer: BC Managed Care – PPO | Admitting: Oncology

## 2014-04-10 ENCOUNTER — Other Ambulatory Visit (HOSPITAL_BASED_OUTPATIENT_CLINIC_OR_DEPARTMENT_OTHER): Payer: BC Managed Care – PPO

## 2014-04-10 ENCOUNTER — Other Ambulatory Visit: Payer: Self-pay | Admitting: Oncology

## 2014-04-10 ENCOUNTER — Ambulatory Visit: Payer: BC Managed Care – PPO

## 2014-04-10 DIAGNOSIS — C569 Malignant neoplasm of unspecified ovary: Secondary | ICD-10-CM

## 2014-04-10 DIAGNOSIS — C801 Malignant (primary) neoplasm, unspecified: Secondary | ICD-10-CM

## 2014-04-10 LAB — CBC WITH DIFFERENTIAL/PLATELET
BASO%: 0.2 % (ref 0.0–2.0)
Basophils Absolute: 0 10*3/uL (ref 0.0–0.1)
EOS%: 1.7 % (ref 0.0–7.0)
Eosinophils Absolute: 0.1 10*3/uL (ref 0.0–0.5)
HCT: 33.3 % — ABNORMAL LOW (ref 34.8–46.6)
HGB: 11.1 g/dL — ABNORMAL LOW (ref 11.6–15.9)
LYMPH%: 20.6 % (ref 14.0–49.7)
MCH: 31.4 pg (ref 25.1–34.0)
MCHC: 33.3 g/dL (ref 31.5–36.0)
MCV: 94.3 fL (ref 79.5–101.0)
MONO#: 0.4 10*3/uL (ref 0.1–0.9)
MONO%: 10 % (ref 0.0–14.0)
NEUT#: 2.8 10*3/uL (ref 1.5–6.5)
NEUT%: 67.5 % (ref 38.4–76.8)
PLATELETS: 92 10*3/uL — AB (ref 145–400)
RBC: 3.53 10*6/uL — AB (ref 3.70–5.45)
RDW: 13.6 % (ref 11.2–14.5)
WBC: 4.2 10*3/uL (ref 3.9–10.3)
lymph#: 0.9 10*3/uL (ref 0.9–3.3)

## 2014-04-10 LAB — COMPREHENSIVE METABOLIC PANEL (CC13)
ALK PHOS: 136 U/L (ref 40–150)
ALT: 10 U/L (ref 0–55)
ANION GAP: 9 meq/L (ref 3–11)
AST: 20 U/L (ref 5–34)
Albumin: 3.5 g/dL (ref 3.5–5.0)
BUN: 13 mg/dL (ref 7.0–26.0)
CO2: 29 mEq/L (ref 22–29)
CREATININE: 0.8 mg/dL (ref 0.6–1.1)
Calcium: 10 mg/dL (ref 8.4–10.4)
Chloride: 105 mEq/L (ref 98–109)
Glucose: 95 mg/dl (ref 70–140)
Potassium: 3.9 mEq/L (ref 3.5–5.1)
Sodium: 143 mEq/L (ref 136–145)
TOTAL PROTEIN: 6.6 g/dL (ref 6.4–8.3)
Total Bilirubin: 0.37 mg/dL (ref 0.20–1.20)

## 2014-04-10 NOTE — Progress Notes (Signed)
Plt count 92,000.  Per Dr Marko Plume, no treatment today.  Pt to return on 12/3 for lab/MD visit/infusion.  Spoke to patient in lobby, given copy of labs, pt verbalized understanding.

## 2014-04-11 LAB — CA 125: CA 125: 1589 U/mL — ABNORMAL HIGH (ref <35)

## 2014-04-19 ENCOUNTER — Other Ambulatory Visit: Payer: Self-pay | Admitting: Oncology

## 2014-04-19 DIAGNOSIS — C569 Malignant neoplasm of unspecified ovary: Secondary | ICD-10-CM

## 2014-04-22 ENCOUNTER — Other Ambulatory Visit: Payer: Self-pay | Admitting: *Deleted

## 2014-04-22 DIAGNOSIS — C569 Malignant neoplasm of unspecified ovary: Secondary | ICD-10-CM

## 2014-04-24 ENCOUNTER — Ambulatory Visit: Payer: BC Managed Care – PPO | Admitting: Oncology

## 2014-04-24 ENCOUNTER — Other Ambulatory Visit: Payer: BC Managed Care – PPO

## 2014-04-24 ENCOUNTER — Ambulatory Visit: Payer: BC Managed Care – PPO

## 2014-04-25 ENCOUNTER — Telehealth: Payer: Self-pay | Admitting: Oncology

## 2014-04-25 ENCOUNTER — Ambulatory Visit (HOSPITAL_BASED_OUTPATIENT_CLINIC_OR_DEPARTMENT_OTHER): Payer: BC Managed Care – PPO

## 2014-04-25 ENCOUNTER — Other Ambulatory Visit: Payer: Self-pay | Admitting: *Deleted

## 2014-04-25 ENCOUNTER — Ambulatory Visit (HOSPITAL_BASED_OUTPATIENT_CLINIC_OR_DEPARTMENT_OTHER): Payer: BC Managed Care – PPO | Admitting: Oncology

## 2014-04-25 ENCOUNTER — Telehealth: Payer: Self-pay | Admitting: *Deleted

## 2014-04-25 ENCOUNTER — Other Ambulatory Visit (HOSPITAL_BASED_OUTPATIENT_CLINIC_OR_DEPARTMENT_OTHER): Payer: BC Managed Care – PPO

## 2014-04-25 ENCOUNTER — Encounter: Payer: Self-pay | Admitting: Oncology

## 2014-04-25 VITALS — BP 160/79 | HR 68 | Temp 98.4°F | Resp 18 | Ht 61.0 in | Wt 136.5 lb

## 2014-04-25 DIAGNOSIS — K51918 Ulcerative colitis, unspecified with other complication: Secondary | ICD-10-CM

## 2014-04-25 DIAGNOSIS — C801 Malignant (primary) neoplasm, unspecified: Secondary | ICD-10-CM

## 2014-04-25 DIAGNOSIS — C569 Malignant neoplasm of unspecified ovary: Secondary | ICD-10-CM

## 2014-04-25 DIAGNOSIS — C772 Secondary and unspecified malignant neoplasm of intra-abdominal lymph nodes: Secondary | ICD-10-CM

## 2014-04-25 DIAGNOSIS — N1330 Unspecified hydronephrosis: Secondary | ICD-10-CM

## 2014-04-25 DIAGNOSIS — Z5111 Encounter for antineoplastic chemotherapy: Secondary | ICD-10-CM

## 2014-04-25 DIAGNOSIS — E43 Unspecified severe protein-calorie malnutrition: Secondary | ICD-10-CM

## 2014-04-25 LAB — URINALYSIS, MICROSCOPIC - CHCC
BILIRUBIN (URINE): NEGATIVE
GLUCOSE UR CHCC: NEGATIVE mg/dL
Ketones: NEGATIVE mg/dL
Nitrite: NEGATIVE
Protein: 30 mg/dL
Specific Gravity, Urine: 1.015 (ref 1.003–1.035)
Urobilinogen, UR: 0.2 mg/dL (ref 0.2–1)
pH: 6 (ref 4.6–8.0)

## 2014-04-25 LAB — CBC WITH DIFFERENTIAL/PLATELET
BASO%: 0.7 % (ref 0.0–2.0)
BASOS ABS: 0 10*3/uL (ref 0.0–0.1)
EOS ABS: 0.1 10*3/uL (ref 0.0–0.5)
EOS%: 1.5 % (ref 0.0–7.0)
HEMATOCRIT: 38.7 % (ref 34.8–46.6)
HEMOGLOBIN: 12.8 g/dL (ref 11.6–15.9)
LYMPH%: 19 % (ref 14.0–49.7)
MCH: 32 pg (ref 25.1–34.0)
MCHC: 33 g/dL (ref 31.5–36.0)
MCV: 96.9 fL (ref 79.5–101.0)
MONO#: 0.4 10*3/uL (ref 0.1–0.9)
MONO%: 10.2 % (ref 0.0–14.0)
NEUT%: 68.6 % (ref 38.4–76.8)
NEUTROS ABS: 3 10*3/uL (ref 1.5–6.5)
PLATELETS: 199 10*3/uL (ref 145–400)
RBC: 3.99 10*6/uL (ref 3.70–5.45)
RDW: 15 % — AB (ref 11.2–14.5)
WBC: 4.4 10*3/uL (ref 3.9–10.3)
lymph#: 0.8 10*3/uL — ABNORMAL LOW (ref 0.9–3.3)

## 2014-04-25 LAB — COMPREHENSIVE METABOLIC PANEL (CC13)
ALBUMIN: 3.8 g/dL (ref 3.5–5.0)
ALK PHOS: 123 U/L (ref 40–150)
ALT: 15 U/L (ref 0–55)
AST: 23 U/L (ref 5–34)
Anion Gap: 10 mEq/L (ref 3–11)
BUN: 13 mg/dL (ref 7.0–26.0)
CO2: 27 mEq/L (ref 22–29)
CREATININE: 0.8 mg/dL (ref 0.6–1.1)
Calcium: 10.5 mg/dL — ABNORMAL HIGH (ref 8.4–10.4)
Chloride: 102 mEq/L (ref 98–109)
EGFR: 81 mL/min/{1.73_m2} — AB (ref >90)
Glucose: 94 mg/dl (ref 70–140)
POTASSIUM: 4.2 meq/L (ref 3.5–5.1)
Sodium: 140 mEq/L (ref 136–145)
Total Bilirubin: 0.59 mg/dL (ref 0.20–1.20)
Total Protein: 6.9 g/dL (ref 6.4–8.3)

## 2014-04-25 MED ORDER — SODIUM CHLORIDE 0.9 % IV SOLN
600.0000 mg/m2 | Freq: Once | INTRAVENOUS | Status: AC
  Administered 2014-04-25: 988 mg via INTRAVENOUS
  Filled 2014-04-25: qty 25.98

## 2014-04-25 MED ORDER — HEPARIN SOD (PORK) LOCK FLUSH 100 UNIT/ML IV SOLN
500.0000 [IU] | Freq: Once | INTRAVENOUS | Status: AC | PRN
  Administered 2014-04-25: 500 [IU]
  Filled 2014-04-25: qty 5

## 2014-04-25 MED ORDER — FERROCITE PLUS 106-1 MG PO TABS
1.0000 | ORAL_TABLET | Freq: Every day | ORAL | Status: DC
Start: 2014-04-25 — End: 2015-07-02

## 2014-04-25 MED ORDER — SODIUM CHLORIDE 0.9 % IJ SOLN
10.0000 mL | INTRAMUSCULAR | Status: DC | PRN
  Administered 2014-04-25: 10 mL
  Filled 2014-04-25: qty 10

## 2014-04-25 MED ORDER — ONDANSETRON 8 MG/50ML IVPB (CHCC)
8.0000 mg | Freq: Once | INTRAVENOUS | Status: AC
  Administered 2014-04-25: 8 mg via INTRAVENOUS

## 2014-04-25 MED ORDER — ONDANSETRON 8 MG/NS 50 ML IVPB
INTRAVENOUS | Status: AC
  Filled 2014-04-25: qty 8

## 2014-04-25 MED ORDER — SODIUM CHLORIDE 0.9 % IV SOLN
Freq: Once | INTRAVENOUS | Status: AC
  Administered 2014-04-25: 13:00:00 via INTRAVENOUS

## 2014-04-25 NOTE — Telephone Encounter (Signed)
Per staff message and POF I have scheduled appts. Advised scheduler of appts. JMW  

## 2014-04-25 NOTE — Progress Notes (Signed)
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 She had first gemzar at 800 mg/m2 on 03-29-14, then cycle 2 delayed for platelets <100k and then delayed for Thanksgiving.    Appetite has been decreased moreso than with doxil, but she is doing better now with 5 small meals daily. She had no fever, no marked fatigue, no actual nausea. Bowels have been moving. She does not have abdominal or pelvic pain.    PAC in Right JJ stent Flu vaccine done  ONCOLOGIC HISTORY atient presented with RLE swelling negative for DVT July 2014, and right hydronephrosis. 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 She had 3 additional cycles, with progressive decrease in CA 125 from 5626 in 12-2012 to 596 by Jan 2015. CT 06-14-13 had ~ stable pelvic mass and adenopathy. Dr Josephina Shih recommended another 3 cycles chemotherapy, regimen changed to q 3 week taxol carboplatin on 06-27-13, which unfortunately caused worsening of peripheral neuropathy and severe taxol aches.Taxotere was substituted for taxol due to neuropathy in feet for cycles 8 and 9, completed 08-08-13. CA 125 improved to low of 494 in Feb 2015, then 732 just prior to cycle 9. CT AP 08-22-13 had minimal improvement, still not adequate to consider debulking surgery. She received doxil for 6 cycles, from 09-19-13 thru 02-06-14. CT AP 02-27-14 had some early increase in central pelvic mass, with treatment changed to gemzar beginning 03-29-14. CA 125 baseline for gemzar was 1932 on 03-27-14.  Review of systems as above,  also: No fever. No bleeding. No SOB or cough. Remainder of 10 point Review of Systems negative.  Objective:  Vital signs in last 24 hours:  BP 160/79 mmHg  Pulse 68  Temp(Src) 98.4 F (36.9 C) (Oral)  Resp 18  Ht 5\' 1"  (1.549 m)  Wt 136 lb 8 oz (61.916 kg)  BMI 25.80 kg/m2  SpO2 99% Weight down 2.5 lbs Alert, oriented and appropriate. Ambulatory without difficulty.  Alopecia  HEENT:PERRL, sclerae not icteric. Oral mucosa moist without lesions, posterior pharynx clear.  Neck supple. No JVD.  Lymphatics:no cervical,supraclavicular, or inguinal adenopathy Resp: clear to auscultation bilaterally and normal percussion bilaterally Cardio: regular rate and rhythm. No gallop. GI: soft, nontender, not distended, no appreciable mass or organomegaly. Some bowel sounds. Surgical incision not remarkable. Ostomy not remarkable Musculoskeletal/ Extremities: without pitting edema, cords, tenderness Neuro: no change peripheral neuropathy. Otherwise nonfocal. PSYCH appropriate mood and affect Skin without rash, ecchymosis, petechiae Portacath-without erythema or tenderness  Lab Results:  Results for orders placed or performed in visit on 04/25/14  CBC with Differential  Result Value Ref Range   WBC 4.4 3.9 - 10.3 10e3/uL   NEUT# 3.0 1.5 - 6.5 10e3/uL   HGB 12.8 11.6 - 15.9 g/dL   HCT 38.7 34.8 - 46.6 %   Platelets 199 145 - 400 10e3/uL   MCV 96.9 79.5 - 101.0 fL   MCH 32.0 25.1 - 34.0 pg   MCHC 33.0 31.5 - 36.0 g/dL   RBC 3.99 3.70 - 5.45 10e6/uL   RDW 15.0 (H) 11.2 - 14.5 %   lymph# 0.8 (L) 0.9 - 3.3 10e3/uL   MONO# 0.4 0.1 - 0.9 10e3/uL  Eosinophils Absolute 0.1 0.0 - 0.5 10e3/uL   Basophils Absolute 0.0 0.0 - 0.1 10e3/uL   NEUT% 68.6 38.4 - 76.8 %   LYMPH% 19.0 14.0 - 49.7 %   MONO% 10.2 0.0 - 14.0 %   EOS% 1.5 0.0 - 7.0 %   BASO% 0.7 0.0 - 2.0 %  Urinalysis with microscopic - [facility]  Result Value Ref Range   Glucose Negative Negative mg/dL   Bilirubin (Urine) Negative  Negative   Ketones Negative Negative mg/dL   Specific Gravity, Urine 1.015 1.003 - 1.035   Blood Large Negative   pH 6.0 4.6 - 8.0   Protein 30 Negative- <30 mg/dL   Urobilinogen, UR 0.2 0.2 - 1 mg/dL   Nitrite Negative Negative   Leukocyte Esterase Large Negative   RBC / HPF 7-10 0 - 2   WBC, UA TNTC 0 - 2   Bacteria, UA Many Negative- Trace   Epithelial Cells Few Negative- Few   Urine culture sent and pending  Studies/Results:  No results found.  Medications: I have reviewed the patient's current medications. Gemzar dose decreased to 600 mg/m2 due to thrombocytopenia with first treatment.   DISCUSSION: will probably do best with gemzar every other week; could consider addition of CDDP depending on how she tolerates next couple of treatments.  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 Doxil x 6 cycles given from 09-19-13 thru 02-06-14. Slight progression in pelvic mass by scans now, no other new disease. She will have second gemzar on 04-10-14 as long as [place] >=1.5 and plt >=100k. I will see her back ~ 12-2. Transportation easiest on Wednesdays.  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 She subsequently had rectum also removed 5.multiple blood transfusions with ulcerative colitis previously, and during this chemo  6. PAC in  7.skin effects from doxil resolved 8.rash to PCN  9. Chemo related cytopenias: did not need gCSF with doxil and counts ok today 10. Extremely sensitive to oral contrast with CTs causing diarrhea, needs water based oral contrast regimen in radiology. Hypokalemia: related to ileostomy output, generally  manages with diet.  11.RLE swelling: no DVT by prior evaluations  12.Social stressors: both parents have died in last few months 13.does not have Advance Directives completed per EMR 14.flu vaccine done   Chemo orders confirmed. Patient knows to call prior to next scheduled visit if needed.  Amberlie Gaillard P, MD   04/25/2014, 12:12 PM

## 2014-04-25 NOTE — Patient Instructions (Signed)
[facility] Discharge Instructions for Patients Receiving Chemotherapy  Today you received the following chemotherapy agents gemzar  To help prevent nausea and vomiting after your treatment, we encourage you to take your nausea medication as directed.  If you develop nausea and vomiting that is not controlled by your nausea medication, call the clinic.   BELOW ARE SYMPTOMS THAT SHOULD BE REPORTED IMMEDIATELY:  *FEVER GREATER THAN 100.5 F  *CHILLS WITH OR WITHOUT FEVER  NAUSEA AND VOMITING THAT IS NOT CONTROLLED WITH YOUR NAUSEA MEDICATION  *UNUSUAL SHORTNESS OF BREATH  *UNUSUAL BRUISING OR BLEEDING  TENDERNESS IN MOUTH AND THROAT WITH OR WITHOUT PRESENCE OF ULCERS  *URINARY PROBLEMS  *BOWEL PROBLEMS  UNUSUAL RASH Items with * indicate a potential emergency and should be followed up as soon as possible.  Feel free to call the clinic you have any questions or concerns. The clinic phone number is [phone].  

## 2014-04-26 LAB — CA 125: CA 125: 2365 U/mL — ABNORMAL HIGH (ref <35)

## 2014-04-26 LAB — URINE CULTURE

## 2014-04-29 ENCOUNTER — Telehealth: Payer: Self-pay

## 2014-04-29 NOTE — Telephone Encounter (Signed)
Told Kirsten Schroeder that the urine culture was negative for a UTI as noted below by Dr. Marko Plume. Kirsten Schroeder states that she is afebrile but is having increased pain with urination and lower back.  Kirsten Schroeder feels that it could be due to the stent.  She will use heat and warm baths as Dr. Diona Fanti suggested.  If the symptoms do not improve with in 24 hrs., Kirsten Schroeder will call Dr. Alan Ripper office. Dr. Marko Plume notified of symptoms and plan.

## 2014-04-29 NOTE — Telephone Encounter (Signed)
-----   Message from Gordy Levan, MD sent at 04/27/2014  7:51 PM EST ----- Labs seen and need follow up: please let her know the urine culture did not identify any infection. How are bladder symptoms?

## 2014-05-05 ENCOUNTER — Other Ambulatory Visit: Payer: Self-pay | Admitting: Oncology

## 2014-05-05 DIAGNOSIS — C569 Malignant neoplasm of unspecified ovary: Secondary | ICD-10-CM

## 2014-05-08 ENCOUNTER — Telehealth: Payer: Self-pay | Admitting: Oncology

## 2014-05-08 ENCOUNTER — Ambulatory Visit (HOSPITAL_BASED_OUTPATIENT_CLINIC_OR_DEPARTMENT_OTHER): Payer: BC Managed Care – PPO | Admitting: Oncology

## 2014-05-08 ENCOUNTER — Ambulatory Visit (HOSPITAL_BASED_OUTPATIENT_CLINIC_OR_DEPARTMENT_OTHER): Payer: BC Managed Care – PPO

## 2014-05-08 ENCOUNTER — Encounter: Payer: Self-pay | Admitting: Oncology

## 2014-05-08 ENCOUNTER — Other Ambulatory Visit (HOSPITAL_BASED_OUTPATIENT_CLINIC_OR_DEPARTMENT_OTHER): Payer: BC Managed Care – PPO

## 2014-05-08 VITALS — BP 131/73 | HR 76 | Temp 98.1°F | Resp 19 | Ht 61.0 in | Wt 136.0 lb

## 2014-05-08 DIAGNOSIS — C801 Malignant (primary) neoplasm, unspecified: Secondary | ICD-10-CM

## 2014-05-08 DIAGNOSIS — C569 Malignant neoplasm of unspecified ovary: Secondary | ICD-10-CM

## 2014-05-08 DIAGNOSIS — G620 Drug-induced polyneuropathy: Secondary | ICD-10-CM

## 2014-05-08 DIAGNOSIS — Z5111 Encounter for antineoplastic chemotherapy: Secondary | ICD-10-CM

## 2014-05-08 DIAGNOSIS — N1330 Unspecified hydronephrosis: Secondary | ICD-10-CM

## 2014-05-08 DIAGNOSIS — T451X5A Adverse effect of antineoplastic and immunosuppressive drugs, initial encounter: Secondary | ICD-10-CM

## 2014-05-08 LAB — COMPREHENSIVE METABOLIC PANEL (CC13)
ALT: 14 U/L (ref 0–55)
ANION GAP: 9 meq/L (ref 3–11)
AST: 20 U/L (ref 5–34)
Albumin: 3.7 g/dL (ref 3.5–5.0)
Alkaline Phosphatase: 119 U/L (ref 40–150)
BILIRUBIN TOTAL: 0.4 mg/dL (ref 0.20–1.20)
BUN: 10.2 mg/dL (ref 7.0–26.0)
CHLORIDE: 105 meq/L (ref 98–109)
CO2: 29 meq/L (ref 22–29)
CREATININE: 0.7 mg/dL (ref 0.6–1.1)
Calcium: 9.7 mg/dL (ref 8.4–10.4)
EGFR: 89 mL/min/{1.73_m2} — ABNORMAL LOW (ref >90)
Glucose: 79 mg/dl (ref 70–140)
Potassium: 4.1 mEq/L (ref 3.5–5.1)
Sodium: 143 mEq/L (ref 136–145)
Total Protein: 6.5 g/dL (ref 6.4–8.3)

## 2014-05-08 LAB — CBC WITH DIFFERENTIAL/PLATELET
BASO%: 0.7 % (ref 0.0–2.0)
BASOS ABS: 0 10*3/uL (ref 0.0–0.1)
EOS%: 2.1 % (ref 0.0–7.0)
Eosinophils Absolute: 0.1 10*3/uL (ref 0.0–0.5)
HCT: 37.6 % (ref 34.8–46.6)
HEMOGLOBIN: 12.2 g/dL (ref 11.6–15.9)
LYMPH#: 0.8 10*3/uL — AB (ref 0.9–3.3)
LYMPH%: 14.9 % (ref 14.0–49.7)
MCH: 31.5 pg (ref 25.1–34.0)
MCHC: 32.4 g/dL (ref 31.5–36.0)
MCV: 97.5 fL (ref 79.5–101.0)
MONO#: 0.5 10*3/uL (ref 0.1–0.9)
MONO%: 9.2 % (ref 0.0–14.0)
NEUT#: 3.8 10*3/uL (ref 1.5–6.5)
NEUT%: 73.1 % (ref 38.4–76.8)
PLATELETS: 201 10*3/uL (ref 145–400)
RBC: 3.86 10*6/uL (ref 3.70–5.45)
RDW: 14.8 % — ABNORMAL HIGH (ref 11.2–14.5)
WBC: 5.2 10*3/uL (ref 3.9–10.3)

## 2014-05-08 MED ORDER — SODIUM CHLORIDE 0.9 % IJ SOLN
10.0000 mL | INTRAMUSCULAR | Status: DC | PRN
  Administered 2014-05-08: 10 mL
  Filled 2014-05-08: qty 10

## 2014-05-08 MED ORDER — OXYBUTYNIN CHLORIDE 5 MG PO TABS: 5.0000 mg | ORAL_TABLET | Freq: Three times a day (TID) | ORAL | Status: DC

## 2014-05-08 MED ORDER — SODIUM CHLORIDE 0.9 % IV SOLN
600.0000 mg/m2 | Freq: Once | INTRAVENOUS | Status: AC
  Administered 2014-05-08: 988 mg via INTRAVENOUS
  Filled 2014-05-08: qty 25.98

## 2014-05-08 MED ORDER — ONDANSETRON 8 MG/50ML IVPB (CHCC)
8.0000 mg | Freq: Once | INTRAVENOUS | Status: AC
  Administered 2014-05-08: 8 mg via INTRAVENOUS

## 2014-05-08 MED ORDER — ONDANSETRON 8 MG/NS 50 ML IVPB
INTRAVENOUS | Status: AC
  Filled 2014-05-08: qty 8

## 2014-05-08 MED ORDER — HEPARIN SOD (PORK) LOCK FLUSH 100 UNIT/ML IV SOLN
500.0000 [IU] | Freq: Once | INTRAVENOUS | Status: AC | PRN
  Administered 2014-05-08: 500 [IU]
  Filled 2014-05-08: qty 5

## 2014-05-08 MED ORDER — SODIUM CHLORIDE 0.9 % IV SOLN: Freq: Once | INTRAVENOUS | Status: DC

## 2014-05-08 NOTE — Telephone Encounter (Signed)
Pt confirmed labs/ov per 12/16 POF, gave pt AVS.... KJ, sent msg to add chemo... °

## 2014-05-08 NOTE — Patient Instructions (Signed)
[facility] Discharge Instructions for Patients Receiving Chemotherapy  Today you received the following chemotherapy agents Gemzar.  To help prevent nausea and vomiting after your treatment, we encourage you to take your nausea medication.   If you develop nausea and vomiting that is not controlled by your nausea medication, call the clinic.   BELOW ARE SYMPTOMS THAT SHOULD BE REPORTED IMMEDIATELY:  *FEVER GREATER THAN 100.5 F  *CHILLS WITH OR WITHOUT FEVER  NAUSEA AND VOMITING THAT IS NOT CONTROLLED WITH YOUR NAUSEA MEDICATION  *UNUSUAL SHORTNESS OF BREATH  *UNUSUAL BRUISING OR BLEEDING  TENDERNESS IN MOUTH AND THROAT WITH OR WITHOUT PRESENCE OF ULCERS  *URINARY PROBLEMS  *BOWEL PROBLEMS  UNUSUAL RASH Items with * indicate a potential emergency and should be followed up as soon as possible.  Feel free to call the clinic you have any questions or concerns. The clinic phone number is [phone].    

## 2014-05-08 NOTE — Progress Notes (Signed)
OFFICE PROGRESS NOTE   05/08/2014   Physicians:ClarkePearson, Quillian Quince; Rema Jasmine, Floy Sabina, Annie Main  INTERVAL HISTORY:  Patient is seen, together with daughter, in continuing attention to advanced gyn carcinoma for which she has recently begun gemzar. If gemzar is tolerated, addition of CDDP would be a consideration. She had cycle 1 gemzar at 800 mg/m2 on 03-29-14, then delay due to thrombocytopenia and for Thanksgiving; she had cycle 2 gemzar at 600 mg/m2 on 12-3, which she tolerated better. She is for cycle 3 today.    Patient tells me that she has felt fairly well for past several days. She is tolerating 5 small meals daily and bowels are moving as usual by colostomy. She is not extremely fatigued. She has not had significant nausea. She denies increased abdominal or pelvic pain. She does not have UTI symptoms now. Chronic RLE swelling unchanged. No bleeding  PAC in Right JJ stent Flu vaccine done    ONCOLOGIC HISTORY Patient presented with RLE swelling negative for DVT July 2014, and right hydronephrosis. 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 She had 3 additional cycles, with progressive decrease in CA 125 from 5626 in 12-2012 to 596 by Jan 2015. CT 06-14-13 had ~ stable pelvic mass and adenopathy. Dr Josephina Shih recommended another 3 cycles chemotherapy, regimen changed to q 3 week taxol carboplatin on 06-27-13, which unfortunately caused worsening of peripheral neuropathy and severe taxol aches.Taxotere was substituted for taxol due to neuropathy in feet for cycles 8 and 9, completed 08-08-13. CA 125 improved to low of 494 in Feb 2015, then 732 just prior to cycle 9. CT AP 08-22-13 had minimal improvement, still not adequate to consider debulking surgery. She received doxil for  6 cycles, from 09-19-13 thru 02-06-14. CT AP 02-27-14 had some early increase in central pelvic mass, with treatment changed to gemzar beginning 03-29-14. CA 125 baseline for gemzar was 1932 on 03-27-14.    Review of systems as above, also: No problems with PAC. No SOB or cough. Is able to sleep Remainder of 10 point Review of Systems negative.  Objective:  Vital signs in last 24 hours:  BP 131/73 mmHg  Pulse 76  Temp(Src) 98.1 F (36.7 C) (Oral)  Resp 19  Ht 5' 1"  (1.549 m)  Wt 136 lb (61.689 kg)  BMI 25.71 kg/m2  SpO2 100% weight is stable Extremely pleasant as always Alert, oriented and appropriate. Ambulatory without assistance.  Alopecia  HEENT:PERRL, sclerae not icteric. Oral mucosa moist without lesions, posterior pharynx clear.  Neck supple. No JVD.  Lymphatics:no cervical,supraclavicular, or inguinal adenopathy Resp: clear to auscultation bilaterally and normal percussion bilaterally Cardio: regular rate and rhythm. No gallop. GI: abdomen soft, nontender, not distended, no mass or organomegaly. Normally active bowel sounds. Surgical incisions not remarkable.Ostomy not remarkable Musculoskeletal/ Extremities: RLE 2+ swelling as has been baseline, left LEwithout pitting edema, cords, tenderness Neuro: no peripheral neuropathy. Otherwise nonfocal Skin without rash, ecchymosis, petechiae Portacath-without erythema or tenderness  Lab Results:  Results for orders placed or performed in visit on 05/08/14  CBC with Differential  Result Value Ref Range   WBC 5.2 3.9 - 10.3 10e3/uL   NEUT# 3.8 1.5 - 6.5 10e3/uL   HGB 12.2 11.6 - 15.9 g/dL   HCT 37.6 34.8 - 46.6 %   Platelets 201 145 - 400 10e3/uL   MCV 97.5 79.5 -  101.0 fL   MCH 31.5 25.1 - 34.0 pg   MCHC 32.4 31.5 - 36.0 g/dL   RBC 3.86 3.70 - 5.45 10e6/uL   RDW 14.8 (H) 11.2 - 14.5 %   lymph# 0.8 (L) 0.9 - 3.3 10e3/uL   MONO# 0.5 0.1 - 0.9 10e3/uL   Eosinophils Absolute 0.1 0.0 - 0.5 10e3/uL   Basophils Absolute 0.0  0.0 - 0.1 10e3/uL   NEUT% 73.1 38.4 - 76.8 %   LYMPH% 14.9 14.0 - 49.7 %   MONO% 9.2 0.0 - 14.0 %   EOS% 2.1 0.0 - 7.0 %   BASO% 0.7 0.0 - 2.0 %  Comprehensive metabolic panel (Cmet) - [facility]  Result Value Ref Range   Sodium 143 136 - 145 mEq/L   Potassium 4.1 3.5 - 5.1 mEq/L   Chloride 105 98 - 109 mEq/L   CO2 29 22 - 29 mEq/L   Glucose 79 70 - 140 mg/dl   BUN 10.2 7.0 - 26.0 mg/dL   Creatinine 0.7 0.6 - 1.1 mg/dL   Total Bilirubin 0.40 0.20 - 1.20 mg/dL   Alkaline Phosphatase 119 40 - 150 U/L   AST 20 5 - 34 U/L   ALT 14 0 - 55 U/L   Total Protein 6.5 6.4 - 8.3 g/dL   Albumin 3.7 3.5 - 5.0 g/dL   Calcium 9.7 8.4 - 10.4 mg/dL   Anion Gap 9 3 - 11 mEq/L   EGFR 89 (L) >90 ml/min/1.73 m2   CA 125 04-25-14 2365, this having been 1932 on 03-27-14 and 1624 in 02-2014 by same new lab method  Studies/Results:  No results found.  Medications: I have reviewed the patient's current medications.  DISCUSSION: same dose of gemzar today (692m/m2). 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 Doxil x 6 cycles given from 09-19-13 thru 02-06-14. Slight progression in pelvic mass by scans now, no other new disease. #3 gemzar today after delays and dose reduction. Will continue q 2 week schedule if possible. I will see her back with treatment 12-30.  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 4.post total colectomy for ulcerative colitis 1990, with ileostomy. She subsequently had rectum also removed 5.multiple blood transfusions with ulcerative colitis  previously, and during this chemo  6. PAC in  7.skin effects from doxil resolved 8.rash to PCN  9. Chemo related cytopenias: did not need gCSF with doxil and counts ok today 10. Extremely sensitive to oral contrast with CTs causing diarrhea, needs water based oral contrast regimen in radiology. Hypokalemia: related to ileostomy output, generally manages with diet.  11.RLE swelling: no DVT by prior evaluations  12.Social stressors: both parents have died in last few months 13.does not have Advance Directives completed per EMR 14.flu vaccine done    Patient and daughter are in agreement with plan and know to call prior to next scheduled visit if needed. Chemo orders confirmed.  Avrum Kimball P, MD   05/08/2014, 1:01 PM

## 2014-05-19 ENCOUNTER — Other Ambulatory Visit: Payer: Self-pay | Admitting: Oncology

## 2014-05-22 ENCOUNTER — Telehealth: Payer: Self-pay | Admitting: *Deleted

## 2014-05-22 ENCOUNTER — Telehealth: Payer: Self-pay

## 2014-05-22 ENCOUNTER — Other Ambulatory Visit (HOSPITAL_BASED_OUTPATIENT_CLINIC_OR_DEPARTMENT_OTHER): Payer: BC Managed Care – PPO

## 2014-05-22 ENCOUNTER — Telehealth: Payer: Self-pay | Admitting: Oncology

## 2014-05-22 ENCOUNTER — Ambulatory Visit (HOSPITAL_BASED_OUTPATIENT_CLINIC_OR_DEPARTMENT_OTHER): Payer: BC Managed Care – PPO | Admitting: Oncology

## 2014-05-22 ENCOUNTER — Ambulatory Visit (HOSPITAL_BASED_OUTPATIENT_CLINIC_OR_DEPARTMENT_OTHER): Payer: BC Managed Care – PPO

## 2014-05-22 ENCOUNTER — Encounter: Payer: Self-pay | Admitting: Oncology

## 2014-05-22 VITALS — BP 117/66 | HR 90 | Temp 98.3°F | Resp 18 | Ht 61.0 in | Wt 136.5 lb

## 2014-05-22 DIAGNOSIS — N1330 Unspecified hydronephrosis: Secondary | ICD-10-CM

## 2014-05-22 DIAGNOSIS — Z5111 Encounter for antineoplastic chemotherapy: Secondary | ICD-10-CM

## 2014-05-22 DIAGNOSIS — C569 Malignant neoplasm of unspecified ovary: Secondary | ICD-10-CM

## 2014-05-22 DIAGNOSIS — I890 Lymphedema, not elsewhere classified: Secondary | ICD-10-CM | POA: Insufficient documentation

## 2014-05-22 DIAGNOSIS — C801 Malignant (primary) neoplasm, unspecified: Secondary | ICD-10-CM

## 2014-05-22 LAB — COMPREHENSIVE METABOLIC PANEL (CC13)
ALT: 16 U/L (ref 0–55)
ANION GAP: 9 meq/L (ref 3–11)
AST: 23 U/L (ref 5–34)
Albumin: 3.5 g/dL (ref 3.5–5.0)
Alkaline Phosphatase: 128 U/L (ref 40–150)
BUN: 11.3 mg/dL (ref 7.0–26.0)
CO2: 30 meq/L — AB (ref 22–29)
CREATININE: 0.8 mg/dL (ref 0.6–1.1)
Calcium: 9.8 mg/dL (ref 8.4–10.4)
Chloride: 103 mEq/L (ref 98–109)
EGFR: 80 mL/min/{1.73_m2} — AB (ref >90)
GLUCOSE: 103 mg/dL (ref 70–140)
Potassium: 3.8 mEq/L (ref 3.5–5.1)
SODIUM: 142 meq/L (ref 136–145)
Total Bilirubin: 0.45 mg/dL (ref 0.20–1.20)
Total Protein: 6.6 g/dL (ref 6.4–8.3)

## 2014-05-22 LAB — CBC WITH DIFFERENTIAL/PLATELET
BASO%: 0.6 % (ref 0.0–2.0)
BASOS ABS: 0 10*3/uL (ref 0.0–0.1)
EOS ABS: 0.1 10*3/uL (ref 0.0–0.5)
EOS%: 1.6 % (ref 0.0–7.0)
HCT: 35 % (ref 34.8–46.6)
HEMOGLOBIN: 11.4 g/dL — AB (ref 11.6–15.9)
LYMPH%: 11.2 % — AB (ref 14.0–49.7)
MCH: 31.7 pg (ref 25.1–34.0)
MCHC: 32.5 g/dL (ref 31.5–36.0)
MCV: 97.4 fL (ref 79.5–101.0)
MONO#: 0.6 10*3/uL (ref 0.1–0.9)
MONO%: 11 % (ref 0.0–14.0)
NEUT#: 3.9 10*3/uL (ref 1.5–6.5)
NEUT%: 75.6 % (ref 38.4–76.8)
PLATELETS: 230 10*3/uL (ref 145–400)
RBC: 3.59 10*6/uL — ABNORMAL LOW (ref 3.70–5.45)
RDW: 15.3 % — ABNORMAL HIGH (ref 11.2–14.5)
WBC: 5.2 10*3/uL (ref 3.9–10.3)
lymph#: 0.6 10*3/uL — ABNORMAL LOW (ref 0.9–3.3)

## 2014-05-22 MED ORDER — ONDANSETRON 8 MG/NS 50 ML IVPB
INTRAVENOUS | Status: AC
  Filled 2014-05-22: qty 8

## 2014-05-22 MED ORDER — SODIUM CHLORIDE 0.9 % IJ SOLN
10.0000 mL | INTRAMUSCULAR | Status: DC | PRN
  Administered 2014-05-22: 10 mL
  Filled 2014-05-22: qty 10

## 2014-05-22 MED ORDER — HEPARIN SOD (PORK) LOCK FLUSH 100 UNIT/ML IV SOLN
500.0000 [IU] | Freq: Once | INTRAVENOUS | Status: AC | PRN
Start: 2014-05-22 — End: 2014-05-22
  Administered 2014-05-22: 500 [IU]
  Filled 2014-05-22: qty 5

## 2014-05-22 MED ORDER — SODIUM CHLORIDE 0.9 % IV SOLN
Freq: Once | INTRAVENOUS | Status: AC
  Administered 2014-05-22: 09:00:00 via INTRAVENOUS

## 2014-05-22 MED ORDER — SODIUM CHLORIDE 0.9 % IV SOLN
600.0000 mg/m2 | Freq: Once | INTRAVENOUS | Status: AC
  Administered 2014-05-22: 988 mg via INTRAVENOUS
  Filled 2014-05-22: qty 25.98

## 2014-05-22 MED ORDER — ONDANSETRON 8 MG/50ML IVPB (CHCC)
8.0000 mg | Freq: Once | INTRAVENOUS | Status: AC
  Administered 2014-05-22: 8 mg via INTRAVENOUS

## 2014-05-22 NOTE — Telephone Encounter (Signed)
Reviewed CDDP  hand out regarding side effects and  Pre-hydration Instructions while patient here for visit incase this drug is added to the Gemzar. Patient verbalized understanding.

## 2014-05-22 NOTE — Patient Instructions (Signed)
[facility] Discharge Instructions for Patients Receiving Chemotherapy  Today you received the following chemotherapy agents Gemzar  To help prevent nausea and vomiting after your treatment, we encourage you to take your nausea medication as directed/prescribed   If you develop nausea and vomiting that is not controlled by your nausea medication, call the clinic.   BELOW ARE SYMPTOMS THAT SHOULD BE REPORTED IMMEDIATELY:  *FEVER GREATER THAN 100.5 F  *CHILLS WITH OR WITHOUT FEVER  NAUSEA AND VOMITING THAT IS NOT CONTROLLED WITH YOUR NAUSEA MEDICATION  *UNUSUAL SHORTNESS OF BREATH  *UNUSUAL BRUISING OR BLEEDING  TENDERNESS IN MOUTH AND THROAT WITH OR WITHOUT PRESENCE OF ULCERS  *URINARY PROBLEMS  *BOWEL PROBLEMS  UNUSUAL RASH Items with * indicate a potential emergency and should be followed up as soon as possible.  Feel free to call the clinic you have any questions or concerns. The clinic phone number is [phone].

## 2014-05-22 NOTE — Progress Notes (Signed)
OFFICE PROGRESS NOTE   05/22/2014   Physicians:ClarkePearson, Quillian Quince; Rema Jasmine, Floy Sabina, Annie Main  INTERVAL HISTORY:  Patient is seen, together with daughter, in continuing attention to chemotherapy in process for advanced gyn carcinoma. She is due cycle 4 q 2 week gemzar today. She continues to tolerate the gemzar well overall. CA 125 is pending today; if this is not responding adequately, will add CDDP to gemzar starting with next treatment.  Primary problem continues to be intermittent bladder irritation related to stent, which seems to be positional and was not improved with trial of new medication samples from Dr Diona Fanti. She sees him next in Feb. Otherwise she has no nausea, is keeping weight stable with 5 small meals daily, no marked fatigue, no obvious gemzar fevers. Swelling in RLE has been a little better.   ONCOLOGIC HISTORY Patient presented with RLE swelling negative for DVT July 2014, and right hydronephrosis. 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 She had 3 additional cycles, with progressive decrease in CA 125 from 5626 in 12-2012 to 596 by Jan 2015. CT 06-14-13 had ~ stable pelvic mass and adenopathy. Dr Josephina Shih recommended another 3 cycles chemotherapy, regimen changed to q 3 week taxol carboplatin on 06-27-13, which unfortunately caused worsening of peripheral neuropathy and severe taxol aches.Taxotere was substituted for taxol due to neuropathy in feet for cycles 8 and 9, completed 08-08-13. CA 125 improved to low of 494 in Feb 2015, then 732 just prior to cycle 9. CT AP 08-22-13 had minimal improvement, still not adequate to consider debulking surgery. She received doxil for 6 cycles, from 09-19-13 thru 02-06-14. CT AP 02-27-14 had some early increase in central  pelvic mass, with treatment changed to gemzar beginning 03-29-14. CA 125 baseline for gemzar was 1932 on 03-27-14.   Review of systems as above, also: No bleeding. No SOB. Able to sleep well. No problems with PAC. Remainder of 10 point Review of Systems negative.  Objective:  Vital signs in last 24 hours:  BP 117/66 mmHg  Pulse 90  Temp(Src) 98.3 F (36.8 C) (Oral)  Resp 18  Ht 5' 1"  (1.549 m)  Wt 136 lb 8 oz (61.916 kg)  BMI 25.80 kg/m2  SpO2 98% Weight is stable Alert, oriented and appropriate. Ambulatory without difficulty.    HEENT:PERRL, sclerae not icteric. Oral mucosa moist without lesions, posterior pharynx clear.  Neck supple. No JVD.  Lymphatics:no cervical,supraclavicular adenopathy Resp: clear to auscultation bilaterally and normal percussion bilaterally Cardio: regular rate and rhythm. No gallop. GI: soft, nontender, not distended, no mass or organomegaly. Normally active bowel sounds. Ostomy functioning well. Musculoskeletal/ Extremities: without pitting edema, cords, tenderness Neuro: no change peripheral neuropathy. Otherwise nonfocal. PSYCH appropriate mood and affect Skin without rash, ecchymosis, petechiae Portacath-without erythema or tenderness  Lab Results:  Results for orders placed or performed in visit on 05/22/14  CBC with Differential  Result Value Ref Range   WBC 5.2 3.9 - 10.3 10e3/uL   NEUT# 3.9 1.5 - 6.5 10e3/uL   HGB 11.4 (L) 11.6 - 15.9 g/dL   HCT 35.0 34.8 - 46.6 %   Platelets 230 145 - 400 10e3/uL   MCV 97.4 79.5 - 101.0 fL   MCH 31.7 25.1 - 34.0 pg   MCHC 32.5 31.5 - 36.0 g/dL   RBC 3.59 (L) 3.70 - 5.45 10e6/uL   RDW  15.3 (H) 11.2 - 14.5 %   lymph# 0.6 (L) 0.9 - 3.3 10e3/uL   MONO# 0.6 0.1 - 0.9 10e3/uL   Eosinophils Absolute 0.1 0.0 - 0.5 10e3/uL   Basophils Absolute 0.0 0.0 - 0.1 10e3/uL   NEUT% 75.6 38.4 - 76.8 %   LYMPH% 11.2 (L) 14.0 - 49.7 %   MONO% 11.0 0.0 - 14.0 %   EOS% 1.6 0.0 - 7.0 %   BASO% 0.6 0.0 - 2.0 %   Comprehensive metabolic panel (Cmet) - [facility]  Result Value Ref Range   Sodium 142 136 - 145 mEq/L   Potassium 3.8 3.5 - 5.1 mEq/L   Chloride 103 98 - 109 mEq/L   CO2 30 (H) 22 - 29 mEq/L   Glucose 103 70 - 140 mg/dl   BUN 11.3 7.0 - 26.0 mg/dL   Creatinine 0.8 0.6 - 1.1 mg/dL   Total Bilirubin 0.45 0.20 - 1.20 mg/dL   Alkaline Phosphatase 128 40 - 150 U/L   AST 23 5 - 34 U/L   ALT 16 0 - 55 U/L   Total Protein 6.6 6.4 - 8.3 g/dL   Albumin 3.5 3.5 - 5.0 g/dL   Calcium 9.8 8.4 - 10.4 mg/dL   Anion Gap 9 3 - 11 mEq/L   EGFR 80 (L) >90 ml/min/1.73 m2    CA 125 pending, this 2365 on 04-25-14.  Studies/Results:  No results found.  Medications: I have reviewed the patient's current medications. She does not tolerate ativan and is off B6 (used with doxil)  DISCUSSION: patient tolerating gemzar very well and agrees to continuing this. We will let her know results of CA125 by phone; if this is not responding adequately to single agent gemzar, will add CDDP also q 2 week. I explained length of treatment for IV hydration with gemzar, and need for oral prehydration. She will need CDDP review by RN over phone if this is added. Written CDDP information given today. Discussed lymphedema PT and possibility of lymphedema pump if she wants to try that, tho she seems to be tolerating present situation and may not want other intervention.  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 Doxil x 6 cycles given from 09-19-13 thru 02-06-14. Slight progression in pelvic mass by scans after doxil, began q 2 week gemzar on 03-27-14. Follow up CA 125 pending and discuss with her by phone. She will have next cycle on 1-13, then I will see her ~ Jan 21 or 25 prior to treatment again Jan 27. 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 Next stent change ~ Feb 1. Slightly symptomatic at times. 4.post total colectomy for ulcerative colitis 1990, with ileostomy. She subsequently had rectum also removed 5.multiple blood transfusions with ulcerative colitis previously, and during this chemo  6. PAC in  7.skin effects from doxil resolved 8.rash to PCN  9. Chemo related cytopenias: did not need gCSF with doxil and counts tjolerating gemzar adequately 10. Extremely sensitive to oral contrast with CTs causing diarrhea, needs water based oral contrast regimen in radiology. Hypokalemia: related to ileostomy output, generally manages with diet.  11.RLE swelling: no DVT by prior evaluations  12.Social stressors: both parents have died in  last few months 13.does not have Advance Directives completed per EMR 14.flu vaccine done   Chemo orders confirmed. Patient and daughter are comfortable with discussion and plan as above.   [name],LENNIS P, MD   05/22/2014, 8:46 AM

## 2014-05-22 NOTE — Telephone Encounter (Signed)
-----   Message from Gordy Levan, MD sent at 05/22/2014  9:07 AM EST ----- Need to let her know CA 125 from 05-22-14  If we need to add CDDP to present gemzar, she will need teaching by RN over phone  thanks

## 2014-05-22 NOTE — Telephone Encounter (Signed)
Per staff message and POF I have scheduled appts. Advised scheduler of appts. JMW  

## 2014-05-23 LAB — CA 125: CA 125: 2680 U/mL — ABNORMAL HIGH (ref <35)

## 2014-05-28 ENCOUNTER — Other Ambulatory Visit: Payer: Self-pay | Admitting: Oncology

## 2014-05-28 DIAGNOSIS — C569 Malignant neoplasm of unspecified ovary: Secondary | ICD-10-CM

## 2014-05-29 ENCOUNTER — Other Ambulatory Visit: Payer: Self-pay | Admitting: Oncology

## 2014-05-29 ENCOUNTER — Telehealth: Payer: Self-pay

## 2014-05-29 ENCOUNTER — Telehealth: Payer: Self-pay | Admitting: Oncology

## 2014-05-29 NOTE — Telephone Encounter (Signed)
, °

## 2014-05-29 NOTE — Telephone Encounter (Signed)
-----   Message from Gordy Levan, MD sent at 05/28/2014  9:23 PM EST ----- Labs seen and need follow up: please let her know that CA 125 from last week is minimally higher than early Dec and a little higher than early Nov. I suggest we try adding CDDP to present gemzar, as we had discussed doing if the marker was not better. Please do teaching for CDDP by phone. I have put in POF to extend treatment on 1-13 to include CDDP. She needs treatments on Wednesdays for daughter's work schedule.  thanks

## 2014-05-29 NOTE — Telephone Encounter (Signed)
Spoke with Kirsten Schroeder and told her the results of the CA-125 as noted below by Dr. Marko Plume.  Kirsten Schroeder is in agreement with adding the CDDP to her Gemzar treatment on 06-05-14. Reviewed the pre-hydration directions as discussed on 05-23-15 with CDDP teaching. Kirsten Schroeder verbalized understanding.

## 2014-05-30 ENCOUNTER — Other Ambulatory Visit: Payer: Self-pay | Admitting: Oncology

## 2014-05-30 ENCOUNTER — Telehealth: Payer: Self-pay | Admitting: *Deleted

## 2014-05-30 NOTE — Telephone Encounter (Signed)
Per the POF and staff message I have changed the appt length on 1/27. Scheduled aware

## 2014-06-05 ENCOUNTER — Other Ambulatory Visit (HOSPITAL_BASED_OUTPATIENT_CLINIC_OR_DEPARTMENT_OTHER): Payer: BC Managed Care – PPO

## 2014-06-05 ENCOUNTER — Other Ambulatory Visit: Payer: BC Managed Care – PPO

## 2014-06-05 ENCOUNTER — Ambulatory Visit (HOSPITAL_BASED_OUTPATIENT_CLINIC_OR_DEPARTMENT_OTHER): Payer: BC Managed Care – PPO

## 2014-06-05 DIAGNOSIS — C801 Malignant (primary) neoplasm, unspecified: Secondary | ICD-10-CM

## 2014-06-05 DIAGNOSIS — Z5111 Encounter for antineoplastic chemotherapy: Secondary | ICD-10-CM

## 2014-06-05 DIAGNOSIS — C569 Malignant neoplasm of unspecified ovary: Secondary | ICD-10-CM

## 2014-06-05 LAB — CBC WITH DIFFERENTIAL/PLATELET
BASO%: 0.6 % (ref 0.0–2.0)
Basophils Absolute: 0 10*3/uL (ref 0.0–0.1)
EOS%: 1.4 % (ref 0.0–7.0)
Eosinophils Absolute: 0.1 10*3/uL (ref 0.0–0.5)
HCT: 36.5 % (ref 34.8–46.6)
HGB: 11.9 g/dL (ref 11.6–15.9)
LYMPH%: 14.9 % (ref 14.0–49.7)
MCH: 31.9 pg (ref 25.1–34.0)
MCHC: 32.6 g/dL (ref 31.5–36.0)
MCV: 97.9 fL (ref 79.5–101.0)
MONO#: 0.6 10*3/uL (ref 0.1–0.9)
MONO%: 11.5 % (ref 0.0–14.0)
NEUT#: 3.7 10*3/uL (ref 1.5–6.5)
NEUT%: 71.6 % (ref 38.4–76.8)
Platelets: 213 10*3/uL (ref 145–400)
RBC: 3.73 10*6/uL (ref 3.70–5.45)
RDW: 15 % — AB (ref 11.2–14.5)
WBC: 5.1 10*3/uL (ref 3.9–10.3)
lymph#: 0.8 10*3/uL — ABNORMAL LOW (ref 0.9–3.3)

## 2014-06-05 LAB — COMPREHENSIVE METABOLIC PANEL (CC13)
ALK PHOS: 145 U/L (ref 40–150)
ALT: 12 U/L (ref 0–55)
AST: 21 U/L (ref 5–34)
Albumin: 3.7 g/dL (ref 3.5–5.0)
Anion Gap: 5 mEq/L (ref 3–11)
BUN: 9.3 mg/dL (ref 7.0–26.0)
CALCIUM: 9.5 mg/dL (ref 8.4–10.4)
CO2: 29 mEq/L (ref 22–29)
CREATININE: 0.8 mg/dL (ref 0.6–1.1)
Chloride: 104 mEq/L (ref 98–109)
EGFR: 86 mL/min/{1.73_m2} — ABNORMAL LOW (ref >90)
Glucose: 95 mg/dl (ref 70–140)
Potassium: 3.8 mEq/L (ref 3.5–5.1)
Sodium: 139 mEq/L (ref 136–145)
Total Bilirubin: 0.28 mg/dL (ref 0.20–1.20)
Total Protein: 6.5 g/dL (ref 6.4–8.3)

## 2014-06-05 LAB — MAGNESIUM (CC13): Magnesium: 1.6 mg/dl (ref 1.5–2.5)

## 2014-06-05 MED ORDER — ACETAMINOPHEN 325 MG PO TABS
650.0000 mg | ORAL_TABLET | Freq: Once | ORAL | Status: AC
  Administered 2014-06-05: 650 mg via ORAL

## 2014-06-05 MED ORDER — ONDANSETRON 8 MG/NS 50 ML IVPB
INTRAVENOUS | Status: AC
  Filled 2014-06-05: qty 8

## 2014-06-05 MED ORDER — SODIUM CHLORIDE 0.9 % IV SOLN
Freq: Once | INTRAVENOUS | Status: AC
  Administered 2014-06-05: 10:00:00 via INTRAVENOUS

## 2014-06-05 MED ORDER — SODIUM CHLORIDE 0.9 % IV SOLN
600.0000 mg/m2 | Freq: Once | INTRAVENOUS | Status: AC
  Administered 2014-06-05: 988 mg via INTRAVENOUS
  Filled 2014-06-05: qty 25.98

## 2014-06-05 MED ORDER — POTASSIUM CHLORIDE 2 MEQ/ML IV SOLN
Freq: Once | INTRAVENOUS | Status: AC
  Administered 2014-06-05: 10:00:00 via INTRAVENOUS
  Filled 2014-06-05: qty 10

## 2014-06-05 MED ORDER — DEXAMETHASONE SODIUM PHOSPHATE 20 MG/5ML IJ SOLN
12.0000 mg | Freq: Once | INTRAMUSCULAR | Status: AC
  Administered 2014-06-05: 12 mg via INTRAVENOUS

## 2014-06-05 MED ORDER — DEXAMETHASONE SODIUM PHOSPHATE 20 MG/5ML IJ SOLN
INTRAMUSCULAR | Status: AC
  Filled 2014-06-05: qty 5

## 2014-06-05 MED ORDER — SODIUM CHLORIDE 0.9 % IV SOLN
150.0000 mg | Freq: Once | INTRAVENOUS | Status: AC
  Administered 2014-06-05: 150 mg via INTRAVENOUS
  Filled 2014-06-05: qty 5

## 2014-06-05 MED ORDER — ACETAMINOPHEN 325 MG PO TABS
ORAL_TABLET | ORAL | Status: AC
  Filled 2014-06-05: qty 2

## 2014-06-05 MED ORDER — ONDANSETRON 8 MG/50ML IVPB (CHCC)
8.0000 mg | Freq: Once | INTRAVENOUS | Status: AC
  Administered 2014-06-05: 8 mg via INTRAVENOUS

## 2014-06-05 MED ORDER — SODIUM CHLORIDE 0.9 % IV SOLN
30.5000 mg/m2 | Freq: Once | INTRAVENOUS | Status: AC
  Administered 2014-06-05: 50 mg via INTRAVENOUS
  Filled 2014-06-05: qty 50

## 2014-06-05 MED ORDER — SODIUM CHLORIDE 0.9 % IJ SOLN
10.0000 mL | INTRAMUSCULAR | Status: DC | PRN
  Administered 2014-06-05: 10 mL
  Filled 2014-06-05: qty 10

## 2014-06-05 MED ORDER — HEPARIN SOD (PORK) LOCK FLUSH 100 UNIT/ML IV SOLN
500.0000 [IU] | Freq: Once | INTRAVENOUS | Status: AC | PRN
  Administered 2014-06-05: 500 [IU]
  Filled 2014-06-05: qty 5

## 2014-06-05 NOTE — Progress Notes (Signed)
Pt requested Tylenol for headache 6/10. Verbal order received from Drue Second. Jan, Treatment RN made aware.

## 2014-06-05 NOTE — Patient Instructions (Signed)
[facility] Discharge Instructions for Patients Receiving Chemotherapy  Today you received the following chemotherapy agents cisplatin and gemcitibine.  To help prevent nausea and vomiting after your treatment, we encourage you to take your nausea medication zofran and ativan.   If you develop nausea and vomiting that is not controlled by your nausea medication, call the clinic.   BELOW ARE SYMPTOMS THAT SHOULD BE REPORTED IMMEDIATELY:  *FEVER GREATER THAN 100.5 F  *CHILLS WITH OR WITHOUT FEVER  NAUSEA AND VOMITING THAT IS NOT CONTROLLED WITH YOUR NAUSEA MEDICATION  *UNUSUAL SHORTNESS OF BREATH  *UNUSUAL BRUISING OR BLEEDING  TENDERNESS IN MOUTH AND THROAT WITH OR WITHOUT PRESENCE OF ULCERS  *URINARY PROBLEMS  *BOWEL PROBLEMS  UNUSUAL RASH Items with * indicate a potential emergency and should be followed up as soon as possible.  Feel free to call the clinic you have any questions or concerns. The clinic phone number is [phone].

## 2014-06-06 ENCOUNTER — Telehealth: Payer: Self-pay | Admitting: *Deleted

## 2014-06-06 NOTE — Telephone Encounter (Signed)
Called Kirsten Schroeder for chemotherapy F/U.  Patient is doing well.  Denies n/v.  Denies any new side effects or symptoms.  Bowel and bladder is functioning well.  Eating and drinking well and I instructed to drink 64 oz minimum daily or at least the day before, of and after treatment.  Denies questions at this time and encouraged to call if needed.  Reviewed how to call after hours in the case of an emergency.

## 2014-06-06 NOTE — Telephone Encounter (Signed)
-----   Message from Ignacia Felling, RN sent at 06/05/2014 10:14 AM EST ----- Regarding: chemo follow up cal 1st cisplat/gemzar  Dr. Edwyna Shell  Pt. Phone [phone]

## 2014-06-10 ENCOUNTER — Other Ambulatory Visit: Payer: Self-pay | Admitting: Oncology

## 2014-06-10 ENCOUNTER — Other Ambulatory Visit (HOSPITAL_BASED_OUTPATIENT_CLINIC_OR_DEPARTMENT_OTHER): Payer: BC Managed Care – PPO

## 2014-06-10 ENCOUNTER — Ambulatory Visit (HOSPITAL_BASED_OUTPATIENT_CLINIC_OR_DEPARTMENT_OTHER): Payer: BC Managed Care – PPO | Admitting: Oncology

## 2014-06-10 ENCOUNTER — Telehealth: Payer: Self-pay | Admitting: Oncology

## 2014-06-10 ENCOUNTER — Encounter: Payer: Self-pay | Admitting: Oncology

## 2014-06-10 VITALS — BP 125/69 | HR 83 | Temp 98.8°F | Resp 18 | Ht 61.0 in | Wt 135.5 lb

## 2014-06-10 DIAGNOSIS — D759 Disease of blood and blood-forming organs, unspecified: Secondary | ICD-10-CM

## 2014-06-10 DIAGNOSIS — C801 Malignant (primary) neoplasm, unspecified: Secondary | ICD-10-CM

## 2014-06-10 DIAGNOSIS — C569 Malignant neoplasm of unspecified ovary: Secondary | ICD-10-CM

## 2014-06-10 DIAGNOSIS — G620 Drug-induced polyneuropathy: Secondary | ICD-10-CM

## 2014-06-10 DIAGNOSIS — M7989 Other specified soft tissue disorders: Secondary | ICD-10-CM

## 2014-06-10 DIAGNOSIS — E876 Hypokalemia: Secondary | ICD-10-CM

## 2014-06-10 DIAGNOSIS — C772 Secondary and unspecified malignant neoplasm of intra-abdominal lymph nodes: Secondary | ICD-10-CM

## 2014-06-10 LAB — MAGNESIUM (CC13): MAGNESIUM: 1.9 mg/dL (ref 1.5–2.5)

## 2014-06-10 LAB — CBC WITH DIFFERENTIAL/PLATELET
BASO%: 0.2 % (ref 0.0–2.0)
Basophils Absolute: 0 10*3/uL (ref 0.0–0.1)
EOS%: 1.1 % (ref 0.0–7.0)
Eosinophils Absolute: 0.1 10*3/uL (ref 0.0–0.5)
HEMATOCRIT: 36.3 % (ref 34.8–46.6)
HGB: 11.7 g/dL (ref 11.6–15.9)
LYMPH#: 0.7 10*3/uL — AB (ref 0.9–3.3)
LYMPH%: 15 % (ref 14.0–49.7)
MCH: 31.4 pg (ref 25.1–34.0)
MCHC: 32.2 g/dL (ref 31.5–36.0)
MCV: 97.3 fL (ref 79.5–101.0)
MONO#: 0 10*3/uL — ABNORMAL LOW (ref 0.1–0.9)
MONO%: 0.4 % (ref 0.0–14.0)
NEUT#: 3.8 10*3/uL (ref 1.5–6.5)
NEUT%: 83.3 % — AB (ref 38.4–76.8)
Platelets: 242 10*3/uL (ref 145–400)
RBC: 3.73 10*6/uL (ref 3.70–5.45)
RDW: 14.6 % — AB (ref 11.2–14.5)
WBC: 4.5 10*3/uL (ref 3.9–10.3)

## 2014-06-10 LAB — BASIC METABOLIC PANEL (CC13)
Anion Gap: 9 mEq/L (ref 3–11)
BUN: 11.9 mg/dL (ref 7.0–26.0)
CALCIUM: 9 mg/dL (ref 8.4–10.4)
CO2: 30 meq/L — AB (ref 22–29)
CREATININE: 0.8 mg/dL (ref 0.6–1.1)
Chloride: 102 mEq/L (ref 98–109)
EGFR: 82 mL/min/{1.73_m2} — ABNORMAL LOW (ref >90)
Glucose: 109 mg/dl (ref 70–140)
POTASSIUM: 4.3 meq/L (ref 3.5–5.1)
Sodium: 141 mEq/L (ref 136–145)

## 2014-06-10 MED ORDER — HYDROCODONE-ACETAMINOPHEN 5-325 MG PO TABS: ORAL_TABLET | ORAL | Status: DC

## 2014-06-10 NOTE — Progress Notes (Signed)
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 She tells me that she has felt better since the CDDP/ gemzar, possibly due to [facility] and better hydration.  Patient did not have significant nausea after this treatment, and has been able to eat usual small portions 5x daily as well as pushing po fluids. Bowels have moved as usual via colostomy. She has not had increased fatigue. She denies fever or aches after gemzar. She does not have symptoms of UTI now.   She has PAC Flu vaccine done Right JJ stent in   [place]  Patient presented with RLE swelling negative for DVT July 2014, and right hydronephrosis. 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 She had 3 additional cycles, with progressive decrease in CA 125 from 5626 in 12-2012 to 596 by Jan 2015. CT 06-14-13 had ~ stable pelvic mass and adenopathy. Dr Josephina Shih recommended another 3 cycles chemotherapy, regimen changed to q 3 week taxol carboplatin on 06-27-13, which unfortunately caused worsening of peripheral neuropathy and severe taxol aches.Taxotere was substituted for taxol due to neuropathy in feet for cycles 8 and 9, completed 08-08-13. CA 125 improved to low of 494 in Feb 2015, then 732 just prior to cycle 9. CT AP 08-22-13 had minimal improvement, still not adequate to consider debulking surgery. She received doxil for 6 cycles, from 09-19-13 thru 02-06-14. CT AP 02-27-14 had some early increase in central pelvic mass, with treatment changed to gemzar beginning 03-29-14. CA 125  baseline for gemzar was 1932 on 03-27-14. Cycle 4 gemzar was given 05-22-14, with CA 125 2680. CDDP was added to gemzar beginning 06-05-14.    Review of systems as above, also: No fever or other symptoms of infection. No abdominal or pelvic pain, other than intermittent discomfort from ureteral stent, unchanged. No bleeding. Swelling RLE improved. No increased SOB. No problems with PAC. Remainder of 10 point Review of Systems negative.  Objective:  Vital signs in last 24 hours:  BP 125/69 mmHg  Pulse 83  Temp(Src) 98.8 F (37.1 C) (Oral)  Resp 18  Ht 5' 1"  (1.549 m)  Wt 135 lb 8 oz (61.462 kg)  BMI 25.62 kg/m2  SpO2 99% Weight down 1 lb Alert, oriented and appropriate. Ambulatory without assistance.  Alopecia  HEENT:PERRL, sclerae not icteric. Oral mucosa moist without lesions, posterior pharynx clear.  Neck supple. No JVD.  Lymphatics:no cervical,supraclavicular, axillary or inguinal adenopathy Resp: clear to auscultation bilaterally and normal percussion bilaterally Cardio: regular rate and rhythm. No gallop. GI: soft, nontender, not distended, no mass or organomegaly. Some bowel sounds. Surgical incision not remarkable.Soft stool in ostomy bag. Musculoskeletal/ Extremities: RLE with 2+ swelling lower leg only, improved. Other extremities without pitting edema, cords, tenderness Neuro: no peripheral neuropathy. Otherwise nonfocal Skin without rash, ecchymosis, petechiae. Portacath-without erythema or tenderness  Lab Results:  Results for orders placed or performed in visit on 06/10/14  CBC with Differential  Result Value Ref Range   WBC 4.5 3.9 - 10.3 10e3/uL   NEUT# 3.8 1.5 - 6.5 10e3/uL   HGB 11.7 11.6 - 15.9 g/dL   HCT 36.3 34.8 -  46.6 %   Platelets 242 145 - 400 10e3/uL   MCV 97.3 79.5 - 101.0 fL   MCH 31.4 25.1 - 34.0 pg   MCHC 32.2 31.5 - 36.0 g/dL   RBC 3.73 3.70 - 5.45 10e6/uL   RDW 14.6 (H) 11.2 - 14.5 %   lymph# 0.7 (L) 0.9 - 3.3 10e3/uL   MONO# 0.0 (L)  0.1 - 0.9 10e3/uL   Eosinophils Absolute 0.1 0.0 - 0.5 10e3/uL   Basophils Absolute 0.0 0.0 - 0.1 10e3/uL   NEUT% 83.3 (H) 38.4 - 76.8 %   LYMPH% 15.0 14.0 - 49.7 %   MONO% 0.4 0.0 - 14.0 %   EOS% 1.1 0.0 - 7.0 %   BASO% 0.2 0.0 - 2.0 %  Basic metabolic panel (Bmet) - [facility]  Result Value Ref Range   Sodium 141 136 - 145 mEq/L   Potassium 4.3 3.5 - 5.1 mEq/L   Chloride 102 98 - 109 mEq/L   CO2 30 (H) 22 - 29 mEq/L   Glucose 109 70 - 140 mg/dl   BUN 11.9 7.0 - 26.0 mg/dL   Creatinine 0.8 0.6 - 1.1 mg/dL   Calcium 9.0 8.4 - 10.4 mg/dL   Anion Gap 9 3 - 11 mEq/L   EGFR 82 (L) >90 ml/min/1.73 m2  Magnesium  Result Value Ref Range   Magnesium 1.9 1.5 - 2.5 mg/dl     Studies/Results:  No results found.  Medications: I have reviewed the patient's current medications. She is not on oral magnesium.  DISCUSSION: patient is pleased with how she has tolerated addition of CDDP thus far. WIll continue as planned, following marker ~ q 4 weeks.  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 Doxil x 6 cycles given from 09-19-13 thru 02-06-14. Slight progression in pelvic mass by scans after doxil. Gemzar x 4 cycles from 03-27-14 thru 05-23-15, with addition of CDDP 06-05-14.  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 Next stent change ~ Feb 1. Slightly symptomatic at times. 4.post total colectomy for ulcerative colitis 1990, with ileostomy. She subsequently had rectum also removed 5.multiple blood transfusions with ulcerative colitis previously, and during this chemo  6. PAC in  7.skin effects from doxil resolved 8.rash to PCN  9. Chemo related  cytopenias: did not need gCSF with doxil and counts tolerating gemzar adequately 10. Extremely sensitive to oral contrast with CTs causing diarrhea, needs water based oral contrast regimen in radiology. Hypokalemia: related to ileostomy output, generally manages with diet.  11.RLE swelling: no DVT by prior evaluations  12.Social stressors: both parents have died in last few months 13.does not have Advance Directives completed per EMR 14.flu vaccine done   Chemo orders confirmed. All questions answered, patient comfortable with plan as above. Time spent 25 min including >50% counseling and coordination of care.    Gordy Levan, MD   06/10/2014, 4:17 PM

## 2014-06-10 NOTE — Telephone Encounter (Signed)
, °

## 2014-06-11 ENCOUNTER — Other Ambulatory Visit: Payer: Self-pay | Admitting: Oncology

## 2014-06-17 ENCOUNTER — Other Ambulatory Visit: Payer: Self-pay | Admitting: Oncology

## 2014-06-17 ENCOUNTER — Ambulatory Visit: Payer: BC Managed Care – PPO | Admitting: Oncology

## 2014-06-17 ENCOUNTER — Telehealth: Payer: Self-pay | Admitting: *Deleted

## 2014-06-17 ENCOUNTER — Other Ambulatory Visit: Payer: BC Managed Care – PPO

## 2014-06-17 NOTE — Telephone Encounter (Signed)
Told Kirsten Schroeder that Kirsten Schroeder did cancel treatment on 06-19-14 and keep appointment with Kirsten Schroeder  On 07-01-14. Kirsten Schroeder is afebrile and urine is clear. She is still trying to see urologist prior to 06-24-14 if possible.

## 2014-06-17 NOTE — Telephone Encounter (Signed)
Chemo scheduled on Wednesday but pt is concerned due to ongoing discomfort with stent.  Pt is attempting to get in with Dr Eulogio Ditch but next available is not until 06/24/2014." I have tried to get in earlier but that is the best I can get ">  Makynzi states concern is with discomfort and " the amount of fluid I would be getting with the chemo " " I want to get the chemo- but just concerned that it could make things worse with my stent but the chemo is going to help me get better "  " I just need to know what to do "  Currently overall urination is fair- but causes discomfort " by late afternoon when I have been drinking more "  Current fluid intake is 5 - 8 ounces a day.  Pt is requesting advice on plan for chemo 06/19/2014 and stent concern.  THIS MESSAGE WILL BE SENT TO MD AND NURSE AT DESK FOR APPROPRIATE FOLLOW UP AND RETURN CALL TO PT.  Return call for pt is [phone].

## 2014-06-19 ENCOUNTER — Other Ambulatory Visit: Payer: BC Managed Care – PPO

## 2014-06-19 ENCOUNTER — Ambulatory Visit: Payer: BC Managed Care – PPO

## 2014-06-21 ENCOUNTER — Encounter: Payer: Self-pay | Admitting: Oncology

## 2014-06-21 NOTE — Progress Notes (Signed)
[id number] (Emend) approved 1/27-16-06/19/15

## 2014-06-30 ENCOUNTER — Other Ambulatory Visit: Payer: Self-pay | Admitting: Oncology

## 2014-07-01 ENCOUNTER — Encounter: Payer: Self-pay | Admitting: Oncology

## 2014-07-01 ENCOUNTER — Telehealth: Payer: Self-pay | Admitting: Oncology

## 2014-07-01 ENCOUNTER — Ambulatory Visit (HOSPITAL_BASED_OUTPATIENT_CLINIC_OR_DEPARTMENT_OTHER): Payer: BC Managed Care – PPO | Admitting: Oncology

## 2014-07-01 ENCOUNTER — Telehealth: Payer: Self-pay | Admitting: *Deleted

## 2014-07-01 ENCOUNTER — Other Ambulatory Visit (HOSPITAL_BASED_OUTPATIENT_CLINIC_OR_DEPARTMENT_OTHER): Payer: BC Managed Care – PPO

## 2014-07-01 VITALS — BP 125/78 | HR 85 | Temp 98.7°F | Resp 18 | Ht 61.0 in | Wt 137.4 lb

## 2014-07-01 DIAGNOSIS — C801 Malignant (primary) neoplasm, unspecified: Secondary | ICD-10-CM

## 2014-07-01 DIAGNOSIS — C772 Secondary and unspecified malignant neoplasm of intra-abdominal lymph nodes: Secondary | ICD-10-CM

## 2014-07-01 DIAGNOSIS — D759 Disease of blood and blood-forming organs, unspecified: Secondary | ICD-10-CM

## 2014-07-01 DIAGNOSIS — T451X5A Adverse effect of antineoplastic and immunosuppressive drugs, initial encounter: Secondary | ICD-10-CM

## 2014-07-01 DIAGNOSIS — G620 Drug-induced polyneuropathy: Secondary | ICD-10-CM

## 2014-07-01 DIAGNOSIS — M7989 Other specified soft tissue disorders: Secondary | ICD-10-CM

## 2014-07-01 DIAGNOSIS — I890 Lymphedema, not elsewhere classified: Secondary | ICD-10-CM

## 2014-07-01 DIAGNOSIS — Z95828 Presence of other vascular implants and grafts: Secondary | ICD-10-CM

## 2014-07-01 DIAGNOSIS — N1330 Unspecified hydronephrosis: Secondary | ICD-10-CM

## 2014-07-01 DIAGNOSIS — C569 Malignant neoplasm of unspecified ovary: Secondary | ICD-10-CM

## 2014-07-01 LAB — COMPREHENSIVE METABOLIC PANEL (CC13)
ALBUMIN: 3.8 g/dL (ref 3.5–5.0)
ALT: 12 U/L (ref 0–55)
AST: 21 U/L (ref 5–34)
Alkaline Phosphatase: 103 U/L (ref 40–150)
Anion Gap: 10 mEq/L (ref 3–11)
BUN: 12.4 mg/dL (ref 7.0–26.0)
CO2: 28 mEq/L (ref 22–29)
Calcium: 9.6 mg/dL (ref 8.4–10.4)
Chloride: 104 mEq/L (ref 98–109)
Creatinine: 0.8 mg/dL (ref 0.6–1.1)
EGFR: 78 mL/min/{1.73_m2} — ABNORMAL LOW (ref >90)
Glucose: 116 mg/dl (ref 70–140)
Potassium: 4.1 mEq/L (ref 3.5–5.1)
Sodium: 142 mEq/L (ref 136–145)
Total Bilirubin: 0.39 mg/dL (ref 0.20–1.20)
Total Protein: 6.6 g/dL (ref 6.4–8.3)

## 2014-07-01 LAB — CBC WITH DIFFERENTIAL/PLATELET
BASO%: 1.2 % (ref 0.0–2.0)
Basophils Absolute: 0.1 10*3/uL (ref 0.0–0.1)
EOS%: 1.9 % (ref 0.0–7.0)
Eosinophils Absolute: 0.1 10*3/uL (ref 0.0–0.5)
HEMATOCRIT: 39.8 % (ref 34.8–46.6)
HGB: 12.8 g/dL (ref 11.6–15.9)
LYMPH#: 0.8 10*3/uL — AB (ref 0.9–3.3)
LYMPH%: 17 % (ref 14.0–49.7)
MCH: 31.6 pg (ref 25.1–34.0)
MCHC: 32.1 g/dL (ref 31.5–36.0)
MCV: 98.3 fL (ref 79.5–101.0)
MONO#: 0.5 10*3/uL (ref 0.1–0.9)
MONO%: 10.4 % (ref 0.0–14.0)
NEUT#: 3.4 10*3/uL (ref 1.5–6.5)
NEUT%: 69.5 % (ref 38.4–76.8)
Platelets: 237 10*3/uL (ref 145–400)
RBC: 4.05 10*6/uL (ref 3.70–5.45)
RDW: 15.8 % — AB (ref 11.2–14.5)
WBC: 4.9 10*3/uL (ref 3.9–10.3)

## 2014-07-01 LAB — MAGNESIUM (CC13): MAGNESIUM: 1.7 mg/dL (ref 1.5–2.5)

## 2014-07-01 NOTE — Telephone Encounter (Signed)
, °

## 2014-07-01 NOTE — Progress Notes (Signed)
OFFICE PROGRESS NOTE   July 01, 2014   Physicians:ClarkePearson, Quillian Quince; Evalee Mutton, Marlene Bast, Floy Sabina, Annie Main  INTERVAL HISTORY:  Patient is seen,alone for visit, in continuing attention to treatment for advanced gyn carcinoma, with second CDDP gemzar planned 07-03-14. She had 4 cycles of single agent gemzar from 03-27-14 thru 05-22-14, then CDDP added 06-05-14. Cycle 2 CDDP gemzar was delayed from 06-19-14 due to symptoms with right ureteral stent, which have resolved with more gradual hydration.  Patient's insurance is requesting additional information for the EMEND given on 06-05-14, which was very helpful for nausea and needs to be continued; she is authorized for EMEND now from 06-19-14 thru 05-2015, and financial staff will address first treatment.  Patient was more comfortable with low dose vicodin for several days when recent problems with the stent, using 1/2 tablet ~ twice daily, and will let us know when she needs refill. She is still up to void ~ 3-4 x per night, but sleeping adequately even so. She denies new or different pain or any SOB. Stable swelling RLE. She is tolerating 5 small meals daily.   She has PAC Flu vaccine done Right JJ stent in   [place] Patient presented with RLE swelling negative for DVT July 2014, and right hydronephrosis. 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 She had 3 additional cycles, with progressive decrease in CA 125 from 5626 in 12-2012 to 596 by Jan 2015. CT 06-14-13 had ~ stable pelvic mass and adenopathy. Dr Josephina Shih recommended another 3 cycles chemotherapy, regimen changed to q 3 week taxol carboplatin on 06-27-13, which unfortunately caused worsening of peripheral neuropathy and severe taxol aches.Taxotere was substituted for  taxol due to neuropathy in feet for cycles 8 and 9, completed 08-08-13. CA 125 improved to low of 494 in Feb 2015, then 732 just prior to cycle 9. CT AP 08-22-13 had minimal improvement, still not adequate to consider debulking surgery. She received doxil for 6 cycles, from 09-19-13 thru 02-06-14. CT AP 02-27-14 had some early increase in central pelvic mass, with treatment changed to gemzar beginning 03-29-14. CA 125 baseline for gemzar was 1932 on 03-27-14. Cycle 4 gemzar was given 05-22-14, with CA 125 2680. CDDP was added to gemzar beginning 06-05-14.    Review of systems as above, also: No increase in peripheral neuropathy. Ostomy functioning well. No bleeding Remainder of 10 point Review of Systems negative.  Objective:  Vital signs in last 24 hours:  BP 125/78 mmHg  Pulse 85  Temp(Src) 98.7 F (37.1 C) (Oral)  Resp 18  Ht 5' 1"  (1.549 m)  Wt 137 lb 6.4 oz (62.324 kg)  BMI 25.97 kg/m2  SpO2 99% Weight up 2 lbs. Alert, oriented and appropriate. Ambulatory without difficulty.   HEENT:PERRL, sclerae not icteric. Oral mucosa moist without lesions, posterior pharynx clear.  Neck supple. No JVD.  Lymphatics:no cervical,supraclavicular, or inguinal adenopathy Resp: clear to auscultation bilaterally and normal percussion bilaterally Cardio: regular rate and rhythm. No gallop. GI: soft, nontender, not distended, no mass or organomegaly. Normally active bowel sounds. Surgical incision not remarkable. Musculoskeletal/ Extremities: RLE 2+ swelling midthigh down without cords, stable. Otherwise without pitting edema, cords, tenderness Neuro: no change peripheral neuropathy. Otherwise nonfocal Skin without rash, ecchymosis, petechiae Portacath-without erythema or tenderness  Lab Results:  Results for orders placed or performed in visit on 07/01/14  CBC  with Differential  Result Value Ref Range   WBC 4.9 3.9 - 10.3 10e3/uL   NEUT# 3.4 1.5 - 6.5 10e3/uL   HGB 12.8 11.6 - 15.9 g/dL   HCT 39.8  34.8 - 46.6 %   Platelets 237 145 - 400 10e3/uL   MCV 98.3 79.5 - 101.0 fL   MCH 31.6 25.1 - 34.0 pg   MCHC 32.1 31.5 - 36.0 g/dL   RBC 4.05 3.70 - 5.45 10e6/uL   RDW 15.8 (H) 11.2 - 14.5 %   lymph# 0.8 (L) 0.9 - 3.3 10e3/uL   MONO# 0.5 0.1 - 0.9 10e3/uL   Eosinophils Absolute 0.1 0.0 - 0.5 10e3/uL   Basophils Absolute 0.1 0.0 - 0.1 10e3/uL   NEUT% 69.5 38.4 - 76.8 %   LYMPH% 17.0 14.0 - 49.7 %   MONO% 10.4 0.0 - 14.0 %   EOS% 1.9 0.0 - 7.0 %   BASO% 1.2 0.0 - 2.0 %  Comprehensive metabolic panel (Cmet) - [facility]  Result Value Ref Range   Sodium 142 136 - 145 mEq/L   Potassium 4.1 3.5 - 5.1 mEq/L   Chloride 104 98 - 109 mEq/L   CO2 28 22 - 29 mEq/L   Glucose 116 70 - 140 mg/dl   BUN 12.4 7.0 - 26.0 mg/dL   Creatinine 0.8 0.6 - 1.1 mg/dL   Total Bilirubin 0.39 0.20 - 1.20 mg/dL   Alkaline Phosphatase 103 40 - 150 U/L   AST 21 5 - 34 U/L   ALT 12 0 - 55 U/L   Total Protein 6.6 6.4 - 8.3 g/dL   Albumin 3.8 3.5 - 5.0 g/dL   Calcium 9.6 8.4 - 10.4 mg/dL   Anion Gap 10 3 - 11 mEq/L   EGFR 78 (L) >90 ml/min/1.73 m2  Magnesium  Result Value Ref Range   Magnesium 1.7 1.5 - 2.5 mg/dl     Studies/Results:  No results found.  Medications: I have reviewed the patient's current medications.  DISCUSSION: Copy of EMEND notification for 06-05-14 discussed directly with financial staff now.  Patient is comfortable continuing treatment as planned.  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 Doxil x 6 cycles given from 09-19-13 thru 02-06-14. Slight progression in pelvic mass by scans after doxil. Gemzar x 4 cycles from 03-27-14 thru 05-23-15, with addition of CDDP 06-05-14. 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 Next stent change April. Stent was more uncomfortable with rapid oral hydration for the CDDP, better now with more continuous hydration at slower rate. 4.post total colectomy for ulcerative colitis 1990, with ileostomy. She subsequently had rectum also removed 5.multiple blood transfusions with ulcerative colitis previously, and during this chemo  6. PAC in  7.skin effects from doxil resolved 8.rash to PCN  9. Chemo related cytopenias: counts tolerating gemzar CDDP adequately thus far, tho has not yet been treated q 2 weeks. 10. Extremely sensitive to oral contrast with CTs causing diarrhea, needs water based oral contrast regimen in radiology. Hypokalemia: related to ileostomy output, generally manages with diet.  11.RLE swelling: no DVT by prior evaluations  12.Social stressors: both parents have died in last few months 13.does not have Advance Directives completed per EMR  14.flu vaccine done   Chemo orders confirmed. I will see her back with labs 2-22 prior to treatment on 2-24. She knows to call prior to next scheduled visit if needed.    Savas Elvin P, MD   07/01/2014, 11:18 AM

## 2014-07-01 NOTE — Telephone Encounter (Signed)
Per staff message and POF I have scheduled appts. Advised scheduler of appts. JMW  

## 2014-07-02 ENCOUNTER — Telehealth: Payer: Self-pay | Admitting: *Deleted

## 2014-07-02 LAB — CA 125: CA 125: 2401 U/mL — ABNORMAL HIGH (ref <35)

## 2014-07-02 NOTE — Telephone Encounter (Signed)
Called patient as noted below by Dr. Marko Plume.

## 2014-07-02 NOTE — Telephone Encounter (Signed)
-----   Message from Gordy Levan, MD sent at 07/02/2014  7:46 AM EST ----- Labs seen and need follow up: please let her know ca125 a little lower now compared with late Dec. Continue chemo as planned

## 2014-07-03 ENCOUNTER — Ambulatory Visit (HOSPITAL_BASED_OUTPATIENT_CLINIC_OR_DEPARTMENT_OTHER): Payer: BLUE CROSS/BLUE SHIELD

## 2014-07-03 DIAGNOSIS — C569 Malignant neoplasm of unspecified ovary: Secondary | ICD-10-CM

## 2014-07-03 DIAGNOSIS — Z5111 Encounter for antineoplastic chemotherapy: Secondary | ICD-10-CM | POA: Diagnosis not present

## 2014-07-03 DIAGNOSIS — C801 Malignant (primary) neoplasm, unspecified: Secondary | ICD-10-CM | POA: Diagnosis not present

## 2014-07-03 MED ORDER — SODIUM CHLORIDE 0.9 % IV SOLN
30.0000 mg/m2 | Freq: Once | INTRAVENOUS | Status: AC
  Administered 2014-07-03: 49 mg via INTRAVENOUS
  Filled 2014-07-03: qty 49

## 2014-07-03 MED ORDER — DEXAMETHASONE SODIUM PHOSPHATE 20 MG/5ML IJ SOLN
INTRAMUSCULAR | Status: AC
  Filled 2014-07-03: qty 5

## 2014-07-03 MED ORDER — HEPARIN SOD (PORK) LOCK FLUSH 100 UNIT/ML IV SOLN
500.0000 [IU] | Freq: Once | INTRAVENOUS | Status: AC | PRN
  Administered 2014-07-03: 500 [IU]
  Filled 2014-07-03: qty 5

## 2014-07-03 MED ORDER — SODIUM CHLORIDE 0.9 % IV SOLN
Freq: Once | INTRAVENOUS | Status: AC
  Administered 2014-07-03: 09:00:00 via INTRAVENOUS

## 2014-07-03 MED ORDER — DEXAMETHASONE SODIUM PHOSPHATE 20 MG/5ML IJ SOLN
12.0000 mg | Freq: Once | INTRAMUSCULAR | Status: AC
  Administered 2014-07-03: 12 mg via INTRAVENOUS

## 2014-07-03 MED ORDER — SODIUM CHLORIDE 0.9 % IV SOLN
150.0000 mg | Freq: Once | INTRAVENOUS | Status: AC
  Administered 2014-07-03: 150 mg via INTRAVENOUS
  Filled 2014-07-03: qty 5

## 2014-07-03 MED ORDER — GEMCITABINE HCL CHEMO INJECTION 1 GM/26.3ML
600.0000 mg/m2 | Freq: Once | INTRAVENOUS | Status: AC
  Administered 2014-07-03: 988 mg via INTRAVENOUS
  Filled 2014-07-03: qty 25.98

## 2014-07-03 MED ORDER — POTASSIUM CHLORIDE 2 MEQ/ML IV SOLN
Freq: Once | INTRAVENOUS | Status: AC
  Administered 2014-07-03: 09:00:00 via INTRAVENOUS
  Filled 2014-07-03: qty 10

## 2014-07-03 MED ORDER — SODIUM CHLORIDE 0.9 % IJ SOLN
10.0000 mL | INTRAMUSCULAR | Status: DC | PRN
  Administered 2014-07-03: 10 mL
  Filled 2014-07-03: qty 10

## 2014-07-03 MED ORDER — ONDANSETRON 8 MG/NS 50 ML IVPB
INTRAVENOUS | Status: AC
  Filled 2014-07-03: qty 8

## 2014-07-03 MED ORDER — ONDANSETRON 8 MG/50ML IVPB (CHCC)
8.0000 mg | Freq: Once | INTRAVENOUS | Status: AC
  Administered 2014-07-03: 8 mg via INTRAVENOUS

## 2014-07-03 NOTE — Patient Instructions (Signed)
[facility] Discharge Instructions for Patients Receiving Chemotherapy  Today you received the following chemotherapy agents: Gemzar and Cisplatin.  To help prevent nausea and vomiting after your treatment, we encourage you to take your nausea medication: Zofran 8 mg 1-2 tablets every 12 hours as needed.   If you develop nausea and vomiting that is not controlled by your nausea medication, call the clinic.   BELOW ARE SYMPTOMS THAT SHOULD BE REPORTED IMMEDIATELY:  *FEVER GREATER THAN 100.5 F  *CHILLS WITH OR WITHOUT FEVER  NAUSEA AND VOMITING THAT IS NOT CONTROLLED WITH YOUR NAUSEA MEDICATION  *UNUSUAL SHORTNESS OF BREATH  *UNUSUAL BRUISING OR BLEEDING  TENDERNESS IN MOUTH AND THROAT WITH OR WITHOUT PRESENCE OF ULCERS  *URINARY PROBLEMS  *BOWEL PROBLEMS  UNUSUAL RASH Items with * indicate a potential emergency and should be followed up as soon as possible.  Feel free to call the clinic you have any questions or concerns. The clinic phone number is [phone].

## 2014-07-12 ENCOUNTER — Other Ambulatory Visit: Payer: Self-pay | Admitting: Urology

## 2014-07-14 ENCOUNTER — Other Ambulatory Visit: Payer: Self-pay | Admitting: Oncology

## 2014-07-15 ENCOUNTER — Ambulatory Visit (HOSPITAL_BASED_OUTPATIENT_CLINIC_OR_DEPARTMENT_OTHER): Payer: BC Managed Care – PPO | Admitting: Oncology

## 2014-07-15 ENCOUNTER — Encounter: Payer: Self-pay | Admitting: Oncology

## 2014-07-15 ENCOUNTER — Telehealth: Payer: Self-pay | Admitting: Oncology

## 2014-07-15 ENCOUNTER — Other Ambulatory Visit (HOSPITAL_BASED_OUTPATIENT_CLINIC_OR_DEPARTMENT_OTHER): Payer: BC Managed Care – PPO

## 2014-07-15 VITALS — BP 132/71 | HR 81 | Temp 98.3°F | Resp 18 | Ht 61.0 in | Wt 137.2 lb

## 2014-07-15 DIAGNOSIS — Z95828 Presence of other vascular implants and grafts: Secondary | ICD-10-CM

## 2014-07-15 DIAGNOSIS — N1330 Unspecified hydronephrosis: Secondary | ICD-10-CM

## 2014-07-15 DIAGNOSIS — G620 Drug-induced polyneuropathy: Secondary | ICD-10-CM

## 2014-07-15 DIAGNOSIS — T451X5A Adverse effect of antineoplastic and immunosuppressive drugs, initial encounter: Secondary | ICD-10-CM

## 2014-07-15 DIAGNOSIS — D6959 Other secondary thrombocytopenia: Secondary | ICD-10-CM

## 2014-07-15 DIAGNOSIS — C801 Malignant (primary) neoplasm, unspecified: Secondary | ICD-10-CM

## 2014-07-15 DIAGNOSIS — C569 Malignant neoplasm of unspecified ovary: Secondary | ICD-10-CM

## 2014-07-15 DIAGNOSIS — D759 Disease of blood and blood-forming organs, unspecified: Secondary | ICD-10-CM

## 2014-07-15 DIAGNOSIS — C772 Secondary and unspecified malignant neoplasm of intra-abdominal lymph nodes: Secondary | ICD-10-CM

## 2014-07-15 LAB — CBC WITH DIFFERENTIAL/PLATELET
BASO%: 0.2 % (ref 0.0–2.0)
Basophils Absolute: 0 10*3/uL (ref 0.0–0.1)
EOS%: 1 % (ref 0.0–7.0)
Eosinophils Absolute: 0.1 10*3/uL (ref 0.0–0.5)
HEMATOCRIT: 36.6 % (ref 34.8–46.6)
HEMOGLOBIN: 12.1 g/dL (ref 11.6–15.9)
LYMPH#: 0.7 10*3/uL — AB (ref 0.9–3.3)
LYMPH%: 13.8 % — ABNORMAL LOW (ref 14.0–49.7)
MCH: 32.1 pg (ref 25.1–34.0)
MCHC: 33.1 g/dL (ref 31.5–36.0)
MCV: 97.1 fL (ref 79.5–101.0)
MONO#: 0.4 10*3/uL (ref 0.1–0.9)
MONO%: 7.9 % (ref 0.0–14.0)
NEUT%: 77.1 % — AB (ref 38.4–76.8)
NEUTROS ABS: 3.8 10*3/uL (ref 1.5–6.5)
Platelets: 76 10*3/uL — ABNORMAL LOW (ref 145–400)
RBC: 3.77 10*6/uL (ref 3.70–5.45)
RDW: 14.2 % (ref 11.2–14.5)
WBC: 4.9 10*3/uL (ref 3.9–10.3)
nRBC: 0 % (ref 0–0)

## 2014-07-15 LAB — COMPREHENSIVE METABOLIC PANEL (CC13)
ALK PHOS: 102 U/L (ref 40–150)
ALT: 9 U/L (ref 0–55)
AST: 15 U/L (ref 5–34)
Albumin: 3.5 g/dL (ref 3.5–5.0)
Anion Gap: 10 mEq/L (ref 3–11)
BUN: 10.1 mg/dL (ref 7.0–26.0)
CALCIUM: 9.7 mg/dL (ref 8.4–10.4)
CO2: 29 mEq/L (ref 22–29)
Chloride: 104 mEq/L (ref 98–109)
Creatinine: 0.8 mg/dL (ref 0.6–1.1)
EGFR: 78 mL/min/{1.73_m2} — ABNORMAL LOW (ref >90)
Glucose: 114 mg/dl (ref 70–140)
Potassium: 3.7 mEq/L (ref 3.5–5.1)
Sodium: 143 mEq/L (ref 136–145)
Total Bilirubin: 0.25 mg/dL (ref 0.20–1.20)
Total Protein: 6.2 g/dL — ABNORMAL LOW (ref 6.4–8.3)

## 2014-07-15 LAB — MAGNESIUM (CC13): Magnesium: 1.8 mg/dl (ref 1.5–2.5)

## 2014-07-15 MED ORDER — HYDROCODONE-ACETAMINOPHEN 5-325 MG PO TABS: ORAL_TABLET | ORAL | Status: DC

## 2014-07-15 NOTE — Progress Notes (Signed)
OFFICE PROGRESS NOTE   July 15, 2014   Physicians:ClarkePearson, Quillian Quince; Evalee Mutton, Marlene Bast, Lorriane Shire; Dahlstedt, Annie Main  INTERVAL HISTORY:  Patient is seen, together with sister, in continuing attention to treatment for advanced gyn carcinoma, due cycle 3 gemzar CDDP on 07-17-14, however she is thrombocytopenic today so that treatment will be delayed; she also had single agent gemzar x 3 cycles prior to addition of CDDP. She is having more discomfort which she feels is related to the right JJ stent, this now to be changed on 07-22-14.  Last imaging was CT AP 02-27-2014. With other problems we will repeat scan prior to planned stent change on 07-22-14.  Patient complains of discomfort "like pieces of glass" along course of the right ureteral stent. She denies fever, hematuria, other clear symptoms of infection. She has used 1/2 vicodin bid for this discomfort, and I have again encouraged her to use this more frequently at higher dose if needed. She is voiding "every 45 min" with the other symptoms, and perineum is irritated; she is using sitz baths and ok to use Desitin (note previous total colectomy and removal of rectum for ulcerative colitis). Nausea has been minimal since addition of EMEND to chemo,and she is tolerating 5 small meals daily. Bowels are moving as usual via ostomy. She denies increased SOB, other bleeding, other pain. Difficult to tell how much fatigue is chemo vs frequent voiding,including 4-5x thru night.  She has PAC Flu vaccine done Right JJ stent in  [place] Patient presented with RLE swelling negative for DVT July 2014, and right hydronephrosis. 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 She had 3 additional cycles, with progressive decrease in CA 125 from 5626 in  12-2012 to 596 by Jan 2015. CT 06-14-13 had ~ stable pelvic mass and adenopathy. Dr Josephina Shih recommended another 3 cycles chemotherapy, regimen changed to q 3 week taxol carboplatin on 06-27-13, which unfortunately caused worsening of peripheral neuropathy and severe taxol aches.Taxotere was substituted for taxol due to neuropathy in feet for cycles 8 and 9, completed 08-08-13. CA 125 improved to low of 494 in Feb 2015, then 732 just prior to cycle 9. CT AP 08-22-13 had minimal improvement, still not adequate to consider debulking surgery. She received doxil for 6 cycles, from 09-19-13 thru 02-06-14. CT AP 02-27-14 had some early increase in central pelvic mass, with treatment changed to gemzar beginning 03-29-14. CA 125 baseline for gemzar was 1932 on 03-27-14. Cycle 4 gemzar was given 05-22-14, with CA 125 2680. CDDP was added to gemzar beginning 06-05-14.    Review of systems as above, also: No problems with PAC. No increase in peripheral neuropathy Remainder of 10 point Review of Systems negative.  Objective:  Vital signs in last 24 hours:  BP 132/71 mmHg  Pulse 81  Temp(Src) 98.3 F (36.8 C) (Oral)  Resp 18  Ht 5\' 1"  (1.549 m)  Wt 137 lb 3.2 oz (62.234 kg)  BMI 25.94 kg/m2  Alert, oriented and appropriate. Ambulatory without assistance. Able to get on and off exam table HEENT:PERRL, sclerae not icteric. Oral mucosa moist without lesions, posterior pharynx clear.  Neck supple. No JVD.  Lymphatics:no cervical,supraclavicular adenopathy Resp: clear to auscultation bilaterally and normal percussion bilaterally. Respirations not labored RA Cardio: regular rate and rhythm. No gallop. GI: soft, nontender, not distended, no mass or organomegaly. Normally active bowel  sounds. Soft stool in ostomy bag Musculoskeletal/ Extremities: RLE with tight swelling in lower leg as usual, otherwise extremities without pitting edema, cords, tenderness Neuro: no change peripheral neuropathy. Otherwise nonfocal.  Psych: appropriate mood and affect Skin without rash, ecchymosis, petechiae  Portacath-without erythema or tenderness  Lab Results:  Results for orders placed or performed in visit on 07/15/14  CBC with Differential  Result Value Ref Range   WBC 4.9 3.9 - 10.3 10e3/uL   NEUT# 3.8 1.5 - 6.5 10e3/uL   HGB 12.1 11.6 - 15.9 g/dL   HCT 36.6 34.8 - 46.6 %   Platelets 76 (L) 145 - 400 10e3/uL   MCV 97.1 79.5 - 101.0 fL   MCH 32.1 25.1 - 34.0 pg   MCHC 33.1 31.5 - 36.0 g/dL   RBC 3.77 3.70 - 5.45 10e6/uL   RDW 14.2 11.2 - 14.5 %   lymph# 0.7 (L) 0.9 - 3.3 10e3/uL   MONO# 0.4 0.1 - 0.9 10e3/uL   Eosinophils Absolute 0.1 0.0 - 0.5 10e3/uL   Basophils Absolute 0.0 0.0 - 0.1 10e3/uL   NEUT% 77.1 (H) 38.4 - 76.8 %   LYMPH% 13.8 (L) 14.0 - 49.7 %   MONO% 7.9 0.0 - 14.0 %   EOS% 1.0 0.0 - 7.0 %   BASO% 0.2 0.0 - 2.0 %   nRBC 0 0 - 0 %    CMET available after visit creatinine stable 0.8, electrolytes and LFTs WNL, total protein 6.2 Magnesium 1.8  CA 125 on 07-01-14 was 2401, having been 2680 on 05-22-14 as baseline for adding CDDP, and 2365 on 04-25-14   Studies/Results:  CT AP requested for 07-17-14, due to symptoms ? related to stent or other and CA 125 not showing significant improvement  Medications: I have reviewed the patient's current medications. Vicodin as above. Hold chemo today due to thrombocytopenia  DISCUSSION: I have suggested repeating urine culture, which patient declines due to proximity of upcoming procedure "Dr Diona Fanti always checks when he changes stent", tho she knows to contact this office or urology if fever or other symptoms. CT as above. Discussed low platelets related to cumulative chemo, will hold treatment until next scheduled in early March to also allow stent change more easily. She will call if bleeding.  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 Doxil x 6 cycles given from 09-19-13 thru 02-06-14. Slight progression in pelvic mass by scans after doxil. Gemzar x 4 cycles from 03-27-14 thru 05-23-15, with addition of CDDP 06-05-14. (Daughter off work on Wednesdays, so that day best for treatments). 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 Patient having more symptoms which she feels are related to stent, for change on 07-22-14. 4.post total colectomy for ulcerative colitis 1990, with ileostomy. She subsequently had rectum also removed 5.Chemo related thrombocytopenia: no bleeding, delay #3 CDDP gemzar ~ 2 weeks 6.multiple blood transfusions with ulcerative colitis previously, and during chemo  7.PAC 8.rash to PCN  9. Extremely sensitive to oral contrast with CTs causing diarrhea, needs water based oral contrast regimen in radiology.  10.RLE swelling: no DVT by prior evaluations    Patient has had all questions answered and is in agreement with plan above.  I will see her back early March prior to next planned CDDP gemzar. We will let her know about the CT by phone prior and send that information to Dr Diona Fanti also. Time spent 42min including >50% counseling and coordination of care.   [name],LENNIS P, MD   07/15/2014, 8:37 AM

## 2014-07-15 NOTE — Telephone Encounter (Signed)
, °

## 2014-07-17 ENCOUNTER — Encounter (HOSPITAL_BASED_OUTPATIENT_CLINIC_OR_DEPARTMENT_OTHER): Payer: Self-pay | Admitting: *Deleted

## 2014-07-17 ENCOUNTER — Ambulatory Visit: Payer: BC Managed Care – PPO

## 2014-07-17 DIAGNOSIS — T451X5A Adverse effect of antineoplastic and immunosuppressive drugs, initial encounter: Secondary | ICD-10-CM

## 2014-07-17 DIAGNOSIS — D6959 Other secondary thrombocytopenia: Secondary | ICD-10-CM | POA: Insufficient documentation

## 2014-07-17 NOTE — Progress Notes (Signed)
NPO AFTER MN. ARRIVE AT 2863. CURRENT LAB RESULTS IN CHART AND EPIC. WILL TAKE OXYBUTYNIN AM DOS W/ SIPS OF WATER AND IF NEEDED TAKE HYDROCODONE.

## 2014-07-18 ENCOUNTER — Telehealth: Payer: Self-pay | Admitting: *Deleted

## 2014-07-18 NOTE — Telephone Encounter (Signed)
CT scan scheduled for tomorrow, 07/19/14. Called patient and she is agreeable to arrive at 8:30 to [facility] to drink contrast prior to scan at 10:30. She states her son can drive her. Also, reminded patient nothing to eat or drink 4 hours prior to scan - patient agreeable to this.

## 2014-07-19 ENCOUNTER — Ambulatory Visit (HOSPITAL_COMMUNITY)
Admission: RE | Admit: 2014-07-19 | Discharge: 2014-07-19 | Disposition: A | Discharge status: Home / Self Care | Payer: BC Managed Care – PPO | Source: Ambulatory Visit | Attending: Oncology | Admitting: Oncology

## 2014-07-19 ENCOUNTER — Telehealth: Payer: Self-pay

## 2014-07-19 ENCOUNTER — Encounter (HOSPITAL_COMMUNITY): Payer: Self-pay

## 2014-07-19 DIAGNOSIS — C772 Secondary and unspecified malignant neoplasm of intra-abdominal lymph nodes: Secondary | ICD-10-CM

## 2014-07-19 DIAGNOSIS — C569 Malignant neoplasm of unspecified ovary: Secondary | ICD-10-CM | POA: Diagnosis not present

## 2014-07-19 MED ORDER — IOHEXOL 300 MG/ML  SOLN
100.0000 mL | Freq: Once | INTRAMUSCULAR | Status: AC | PRN
  Administered 2014-07-19: 100 mL via INTRAVENOUS

## 2014-07-19 MED ORDER — IOHEXOL 300 MG/ML  SOLN
50.0000 mL | Freq: Once | INTRAMUSCULAR | Status: AC | PRN
  Administered 2014-07-19: 50 mL via ORAL

## 2014-07-19 NOTE — Telephone Encounter (Signed)
-----   Message from Gordy Levan, MD sent at 07/19/2014  3:20 PM EST ----- Labs seen and need follow up: please let patient know that scan shows nothing new and nothing worse, with the pelvic mass at least stable/ maybe a little smaller, and nothing different with the right kidney or ureter that I can tell. I will send the CT report to Dr Diona Fanti.

## 2014-07-19 NOTE — Telephone Encounter (Signed)
Spoke with Kirsten Schroeder and told her the results of CT as noted below by Dr. Marko Plume.  Pt. relived that mass was not worse.   Dr. Marko Plume electronically sent a copy of the CT report to Dr. Diona Fanti for stent change on Monday 07-22-14.

## 2014-07-21 NOTE — H&P (Signed)
  H&P  Chief Complaint: Blocked right kidney  History of Present Illness: Kirsten Schroeder is a 59 y.o. 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 She last had this performed in April of 2015.  Past Medical History  Diagnosis Date  . Ulcerative colitis   . Hydronephrosis, right   . Ejection fraction < 50%     EF 56%  AND NORMAL WALL MOTION --  PER  MUGA  09-06-2013  . 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 Wears glasses   . Hydronephrosis, right   . Chemotherapy induced thrombocytopenia   . Peripheral neuropathy due to chemotherapy   . Hypertension     PT CURRENT NOT TAKING BP MEDICATION PER PCP SINCE BP IS LOW SECONDARY TO CHEMO  . 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 Bone metastasis     MILD ---  SECONDARY OVARIAN CANCER  . 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 Cystoscopy w/ ureteral stent placement Right 01/12/2013    Procedure: CYSTOSCOPY WITH RETROGRADE PYELOGRAM/ RIGHT DOUBLE J STENT PLACEMENT;  Surgeon: Franchot Gallo, MD;  Location: [facility];  Service: Urology;  Laterality: Right;  . Cesarean section    . Cholecystectomy  1992  . Total colectomy w/ permanent ileostomy  1990  (2 PART SURGERY)    SEVERE UC  . Cystoscopy w/ ureteral stent placement Right 09/13/2013    Procedure: CYSTOSCOPY WITH STENT REPLACEMENT;  Surgeon: Franchot Gallo, MD;  Location: [facility];  Service: Urology;  Laterality: Right;    Home Medications:  No prescriptions prior to admission    Allergies:  Allergies  Allergen Reactions  . Penicillins Rash    Family History  Problem Relation Age of Onset  . Hypertension Mother   . Arthritis Mother   . Hypertension Father   . Diabetes Father   . Hypertension Brother   . Diabetes Brother      Social History:  reports that she has never smoked. She has never used smokeless tobacco. She reports that she does not drink alcohol or use illicit drugs.  ROS: A complete review of systems was performed.  All systems are negative except for pertinent findings as noted.  Physical Exam:  Vital signs in last 24 hours:   General:  Alert and oriented, No acute distress. Alopecia HEENT: Normocephalic, atraumatic Neck: No JVD or lymphadenopathy Cardiovascular: Regular rate and rhythm Lungs: Clear bilaterally Abdomen: Soft, nontender, nondistended, no abdominal masses Back: No CVA tenderness Extremities: No edema Neurologic: Grossly intact  Laboratory Data:  No results found for this or any previous visit (from the past 24 hour(s)). No results found for this or any previous visit (from the past 240 hour(s)). Creatinine:  Recent Labs  07/15/14 0808  CREATININE 0.8    Radiologic Imaging: No results found.  Impression/Assessment:  Malignant right hydronephrosis  Plan:  Cysto, right J2 stent exchange  Jorja Loa 07/22/2014, 5:32 AM  [name]. Demoni Parmar MD

## 2014-07-21 NOTE — Anesthesia Preprocedure Evaluation (Signed)
Anesthesia Evaluation  Patient identified by MRN, date of birth, ID band Patient awake    Reviewed: Allergy & Precautions, H&P , NPO status , Patient's Chart, lab work & pertinent test results  Airway Mallampati: II  TM Distance: >3 FB Neck ROM: full    Dental  (+) Edentulous Upper, Dental Advisory Given   Pulmonary neg pulmonary ROS,  breath sounds clear to auscultation  Pulmonary exam normal       Cardiovascular Exercise Tolerance: Good hypertension, Pt. on medications Rhythm:regular Rate:Normal     Neuro/Psych negative neurological ROS  negative psych ROS   GI/Hepatic Neg liver ROS, PUD,   Endo/Other  negative endocrine ROS  Renal/GU negative Renal ROS  negative genitourinary   Musculoskeletal   Abdominal   Peds  Hematology  Chemo induced thrombocytopenia 76K   Anesthesia Other Findings Metastatic cancer  Reproductive/Obstetrics negative OB ROS Ovarian ca with bone mets                             Anesthesia Physical Anesthesia Plan  ASA: III  Anesthesia Plan: General   Post-op Pain Management:    Induction: Intravenous  Airway Management Planned: Oral ETT  Additional Equipment:   Intra-op Plan:   Post-operative Plan:   Informed Consent:   Plan Discussed with: Surgeon  Anesthesia Plan Comments:         Anesthesia Quick Evaluation

## 2014-07-22 ENCOUNTER — Ambulatory Visit (HOSPITAL_BASED_OUTPATIENT_CLINIC_OR_DEPARTMENT_OTHER)
Admission: RE | Admit: 2014-07-22 | Discharge: 2014-07-22 | Disposition: A | Discharge status: Home / Self Care | Payer: BC Managed Care – PPO | Source: Ambulatory Visit | Attending: Urology | Admitting: Urology

## 2014-07-22 ENCOUNTER — Ambulatory Visit (HOSPITAL_BASED_OUTPATIENT_CLINIC_OR_DEPARTMENT_OTHER): Payer: BC Managed Care – PPO | Admitting: Anesthesiology

## 2014-07-22 ENCOUNTER — Encounter (HOSPITAL_BASED_OUTPATIENT_CLINIC_OR_DEPARTMENT_OTHER): Payer: Self-pay | Admitting: *Deleted

## 2014-07-22 ENCOUNTER — Encounter (HOSPITAL_BASED_OUTPATIENT_CLINIC_OR_DEPARTMENT_OTHER)
Admission: RE | Disposition: A | Discharge status: Home / Self Care | Payer: Self-pay | Source: Ambulatory Visit | Attending: Urology

## 2014-07-22 DIAGNOSIS — C801 Malignant (primary) neoplasm, unspecified: Secondary | ICD-10-CM | POA: Insufficient documentation

## 2014-07-22 DIAGNOSIS — K279 Peptic ulcer, site unspecified, unspecified as acute or chronic, without hemorrhage or perforation: Secondary | ICD-10-CM | POA: Insufficient documentation

## 2014-07-22 DIAGNOSIS — N1330 Unspecified hydronephrosis: Secondary | ICD-10-CM | POA: Diagnosis not present

## 2014-07-22 DIAGNOSIS — Z8543 Personal history of malignant neoplasm of ovary: Secondary | ICD-10-CM | POA: Diagnosis not present

## 2014-07-22 DIAGNOSIS — C7951 Secondary malignant neoplasm of bone: Secondary | ICD-10-CM | POA: Insufficient documentation

## 2014-07-22 DIAGNOSIS — Z85830 Personal history of malignant neoplasm of bone: Secondary | ICD-10-CM | POA: Diagnosis not present

## 2014-07-22 DIAGNOSIS — D6959 Other secondary thrombocytopenia: Secondary | ICD-10-CM | POA: Insufficient documentation

## 2014-07-22 DIAGNOSIS — I10 Essential (primary) hypertension: Secondary | ICD-10-CM | POA: Diagnosis not present

## 2014-07-22 DIAGNOSIS — G629 Polyneuropathy, unspecified: Secondary | ICD-10-CM | POA: Insufficient documentation

## 2014-07-22 DIAGNOSIS — Z8249 Family history of ischemic heart disease and other diseases of the circulatory system: Secondary | ICD-10-CM | POA: Diagnosis not present

## 2014-07-22 HISTORY — DX: Secondary and unspecified malignant neoplasm of intra-abdominal lymph nodes: C77.2

## 2014-07-22 HISTORY — DX: Other secondary thrombocytopenia: D69.59

## 2014-07-22 HISTORY — PX: CYSTOSCOPY W/ URETERAL STENT PLACEMENT: SHX1429

## 2014-07-22 HISTORY — DX: Adverse effect of antineoplastic and immunosuppressive drugs, initial encounter: G62.0

## 2014-07-22 HISTORY — DX: Other secondary thrombocytopenia: T45.1X5A

## 2014-07-22 SURGERY — CYSTOSCOPY, FLEXIBLE, WITH STENT REPLACEMENT
Anesthesia: General | Site: Ureter | Laterality: Right

## 2014-07-22 MED ORDER — HYDROCODONE-ACETAMINOPHEN 5-325 MG PO TABS
1.0000 | ORAL_TABLET | ORAL | Status: DC | PRN
  Administered 2014-07-22: 0.5 via ORAL
  Filled 2014-07-22: qty 1

## 2014-07-22 MED ORDER — MIDAZOLAM HCL 5 MG/5ML IJ SOLN
INTRAMUSCULAR | Status: DC | PRN
  Administered 2014-07-22: 2 mg via INTRAVENOUS

## 2014-07-22 MED ORDER — CIPROFLOXACIN IN D5W 400 MG/200ML IV SOLN
INTRAVENOUS | Status: AC
  Filled 2014-07-22: qty 200

## 2014-07-22 MED ORDER — FENTANYL CITRATE 0.05 MG/ML IJ SOLN
INTRAMUSCULAR | Status: DC | PRN
  Administered 2014-07-22: 25 ug via INTRAVENOUS
  Administered 2014-07-22: 50 ug via INTRAVENOUS
  Administered 2014-07-22: 25 ug via INTRAVENOUS
  Administered 2014-07-22: 50 ug via INTRAVENOUS

## 2014-07-22 MED ORDER — OXYBUTYNIN CHLORIDE 5 MG PO TABS
5.0000 mg | ORAL_TABLET | Freq: Three times a day (TID) | ORAL | Status: DC
Start: 2014-07-22 — End: 2014-07-22
  Administered 2014-07-22: 5 mg via ORAL
  Filled 2014-07-22: qty 1

## 2014-07-22 MED ORDER — FENTANYL CITRATE 0.05 MG/ML IJ SOLN
25.0000 ug | INTRAMUSCULAR | Status: DC | PRN
  Filled 2014-07-22: qty 1

## 2014-07-22 MED ORDER — LACTATED RINGERS IV SOLN
INTRAVENOUS | Status: DC
  Filled 2014-07-22: qty 1000

## 2014-07-22 MED ORDER — ACETAMINOPHEN 10 MG/ML IV SOLN
INTRAVENOUS | Status: DC | PRN
  Administered 2014-07-22: 1000 mg via INTRAVENOUS

## 2014-07-22 MED ORDER — MIDAZOLAM HCL 2 MG/2ML IJ SOLN
INTRAMUSCULAR | Status: AC
  Filled 2014-07-22: qty 2

## 2014-07-22 MED ORDER — SULFAMETHOXAZOLE-TRIMETHOPRIM 800-160 MG PO TABS: 1.0000 | ORAL_TABLET | Freq: Two times a day (BID) | ORAL | Status: DC

## 2014-07-22 MED ORDER — PROPOFOL 10 MG/ML IV BOLUS
INTRAVENOUS | Status: DC | PRN
  Administered 2014-07-22: 150 mg via INTRAVENOUS

## 2014-07-22 MED ORDER — STERILE WATER FOR IRRIGATION IR SOLN
Status: DC | PRN
  Administered 2014-07-22: 3000 mL

## 2014-07-22 MED ORDER — DEXAMETHASONE SODIUM PHOSPHATE 4 MG/ML IJ SOLN
INTRAMUSCULAR | Status: DC | PRN
  Administered 2014-07-22: 10 mg via INTRAVENOUS

## 2014-07-22 MED ORDER — FENTANYL CITRATE 0.05 MG/ML IJ SOLN
INTRAMUSCULAR | Status: AC
  Filled 2014-07-22: qty 4

## 2014-07-22 MED ORDER — CIPROFLOXACIN IN D5W 400 MG/200ML IV SOLN
400.0000 mg | INTRAVENOUS | Status: AC
  Administered 2014-07-22: 400 mg via INTRAVENOUS
  Filled 2014-07-22: qty 200

## 2014-07-22 MED ORDER — LIDOCAINE HCL (CARDIAC) 20 MG/ML IV SOLN
INTRAVENOUS | Status: DC | PRN
  Administered 2014-07-22: 60 mg via INTRAVENOUS

## 2014-07-22 MED ORDER — OXYBUTYNIN CHLORIDE 5 MG PO TABS
ORAL_TABLET | ORAL | Status: AC
  Filled 2014-07-22: qty 1

## 2014-07-22 MED ORDER — HYDROCODONE-ACETAMINOPHEN 5-325 MG PO TABS
ORAL_TABLET | ORAL | Status: AC
Start: 2014-07-22 — End: 2014-07-22
  Filled 2014-07-22: qty 1

## 2014-07-22 MED ORDER — ONDANSETRON HCL 4 MG/2ML IJ SOLN
INTRAMUSCULAR | Status: DC | PRN
  Administered 2014-07-22: 4 mg via INTRAVENOUS

## 2014-07-22 MED ORDER — LACTATED RINGERS IV SOLN
INTRAVENOUS | Status: DC
  Administered 2014-07-22: 07:00:00 via INTRAVENOUS
  Filled 2014-07-22: qty 1000

## 2014-07-22 SURGICAL SUPPLY — 17 items
ADAPTER CATH URET PLST 4-6FR (CATHETERS) IMPLANT
BAG DRAIN URO-CYSTO SKYTR STRL (DRAIN) ×3 IMPLANT
CANISTER SUCT LVC 12 LTR MEDI- (MISCELLANEOUS) ×3 IMPLANT
CATH INTERMIT  6FR 70CM (CATHETERS) IMPLANT
CLOTH BEACON ORANGE TIMEOUT ST (SAFETY) ×3 IMPLANT
GLOVE BIO SURGEON STRL SZ8 (GLOVE) ×3 IMPLANT
GLOVE SURG SS PI 7.5 STRL IVOR (GLOVE) ×3 IMPLANT
GOWN PREVENTION PLUS LG XLONG (DISPOSABLE) IMPLANT
GOWN STRL REIN XL XLG (GOWN DISPOSABLE) IMPLANT
GOWN STRL REUS W/ TWL XL LVL3 (GOWN DISPOSABLE) ×2 IMPLANT
GOWN STRL REUS W/TWL XL LVL3 (GOWN DISPOSABLE) ×4
GUIDEWIRE 0.038 PTFE COATED (WIRE) IMPLANT
GUIDEWIRE ANG ZIPWIRE 038X150 (WIRE) IMPLANT
GUIDEWIRE STR DUAL SENSOR (WIRE) IMPLANT
NS IRRIG 500ML POUR BTL (IV SOLUTION) ×3 IMPLANT
PACK CYSTO (CUSTOM PROCEDURE TRAY) ×3 IMPLANT
STENT URET 6FRX24 CONTOUR (STENTS) ×3 IMPLANT

## 2014-07-22 NOTE — Op Note (Signed)
Preoperative diagnosis:  1. Right malignant hydronephrosis  Postoperative diagnosis:  1. Same   Procedure:  1. Cystoscopy 2. Right ureteral stent placement (24 cm x 7 French without tether) right double-J stent extraction  Surgeon: [name]. Kanan Sobek  M.D.  Anesthesia: General  Complications: None  Intraoperative findings: Normal bladder  EBL: Minimal  Specimens: None  Indication: Kirsten Schroeder is a 59 y.o. patient with right hydronephrosis secondary to a GYN malignancy. She's been treated previously with double-J stent placement which has been adequate in draining her right renal unit. Her last stent was placed almost a year ago. She presents at this time for stent removal and replacement. After reviewing the management options for treatment, he/she elected to proceed with the above surgical procedure(s). We have discussed the potential benefits and risks of the procedure, side effects of the proposed treatment, the likelihood of the patient achieving the goals of the procedure, and any potential problems that might occur during the procedure or recuperation. Informed consent has been obtained.  Description of procedure:  The patient was taken to the operating room and general anesthesia was induced.  The patient was placed in the dorsal lithotomy position, prepped and draped in the usual sterile fashion, and preoperative antibiotics were administered. A preoperative time-out was performed.   Cystourethroscopy was performed.  The bladder was  systematically examined in its entirety. There was no evidence for any bladder tumors, stones, or other mucosal pathology.  Right double-J stent was noted at the right ureteral orifice. There were some encrustations on the bladder loop. The right double-J stent was grasped and brought through the urethral meatus. I then cut the stent at the distal loop, and inserted the guidewire which was placed through the stent and up into the right kidney  using fluoroscopic guidance. The stent was then removed over top of the guidewire.  The wire was then backloaded through the cystoscope and a  24 cm x 7 [place] ( with string removed) ureteral stent was advance over the wire using Seldinger technique.  The stent was positioned appropriately under fluoroscopic and cystoscopic guidance.  The wire was then removed with an adequate stent curl noted in the renal pelvis as well as in the bladder.  The bladder was then emptied and the procedure ended.  The patient appeared to tolerate the procedure well and without complications.  The patient was able to be awakened and transferred to the recovery unit in satisfactory condition.    [name]. Diona Fanti, MD

## 2014-07-22 NOTE — Anesthesia Postprocedure Evaluation (Signed)
  Anesthesia Post-op Note  Patient: Kirsten Schroeder  Procedure(s) Performed: Procedure(s) (LRB): CYSTOSCOPY WITH STENT REPLACEMENT (Right)  Patient Location: PACU  Anesthesia Type: General  Level of Consciousness: awake and alert   Airway and Oxygen Therapy: Patient Spontanous Breathing  Post-op Pain: mild  Post-op Assessment: Post-op Vital signs reviewed, Patient's Cardiovascular Status Stable, Respiratory Function Stable, Patent Airway and No signs of Nausea or vomiting  Last Vitals:  Filed Vitals:   07/22/14 0930  BP: 134/69  Pulse: 73  Temp:   Resp: 16    Post-op Vital Signs: stable   Complications: No apparent anesthesia complications

## 2014-07-22 NOTE — Discharge Instructions (Signed)
1. You may see some blood in the urine and may have some burning with urination for 48-72 hours. You also may notice that you have to urinate more frequently or urgently after your procedure which is normal.  °2. You should call should you develop an inability urinate, fever > 101, persistent nausea and vomiting that prevents you from eating or drinking to stay hydrated.  °3. If you have a stent, you will likely urinate more frequently and urgently until the stent is removed and you may experience some discomfort/pain in the lower abdomen and flank especially when urinating. You may take pain medication prescribed to you if needed for pain. You may also intermittently have blood in the urine until the stent is removed. °4. If you have a catheter, you will be taught how to take care of the catheter by the nursing staff prior to discharge from the hospital.  You may periodically feel a strong urge to void with the catheter in place.  This is a bladder spasm and most often can occur when having a bowel movement or moving around. It is typically self-limited and usually will stop after a few minutes.  You may use some Vaseline or Neosporin around the tip of the catheter to reduce friction at the tip of the penis. You may also see some blood in the urine.  A very small amount of blood can make the urine look quite red.  As long as the catheter is draining well, there usually is not a problem.  However, if the catheter is not draining well and is bloody, you should call the office ([phone]) to notify us. ° ° °Post Anesthesia Home Care Instructions ° °Activity: °Get plenty of rest for the remainder of the day. A responsible adult should stay with you for 24 hours following the procedure.  °For the next 24 hours, DO NOT: °-Drive a car °-Operate machinery °-Drink alcoholic beverages °-Take any medication unless instructed by your physician °-Make any legal decisions or sign important papers. ° °Meals: °Start with liquid  foods such as gelatin or soup. Progress to regular foods as tolerated. Avoid greasy, spicy, heavy foods. If nausea and/or vomiting occur, drink only clear liquids until the nausea and/or vomiting subsides. Call your physician if vomiting continues. ° °Special Instructions/Symptoms: °Your throat may feel dry or sore from the anesthesia or the breathing tube placed in your throat during surgery. If this causes discomfort, gargle with warm salt water. The discomfort should disappear within 24 hours. ° ° °

## 2014-07-22 NOTE — Transfer of Care (Signed)
Immediate Anesthesia Transfer of Care Note  Patient: Kirsten Schroeder  Procedure(s) Performed: Procedure(s) (LRB): CYSTOSCOPY WITH STENT REPLACEMENT (Right)  Patient Location: PACU  Anesthesia Type: General  Level of Consciousness: awake, alert  and oriented  Airway & Oxygen Therapy: Patient Spontanous Breathing and Patient connected to face mask oxygen  Post-op Assessment: Report given to PACU RN and Post -op Vital signs reviewed and stable  Post vital signs: Reviewed and stable  Complications: No apparent anesthesia complications Last Vitals:  Filed Vitals:   07/22/14 0648  BP: 137/65  Pulse: 87  Temp: 37.1 C  Resp: 14

## 2014-07-22 NOTE — Anesthesia Procedure Notes (Signed)
Procedure Name: LMA Insertion Date/Time: 07/22/2014 8:20 AM Performed by: Mechele Claude Pre-anesthesia Checklist: Patient identified, Emergency Drugs available, Suction available and Patient being monitored Patient Re-evaluated:Patient Re-evaluated prior to inductionOxygen Delivery Method: Circle System Utilized Preoxygenation: Pre-oxygenation with 100% oxygen Intubation Type: IV induction Ventilation: Mask ventilation without difficulty LMA: LMA inserted LMA Size: 4.0 Number of attempts: 1 Airway Equipment and Method: bite block Placement Confirmation: positive ETCO2 Tube secured with: Tape Dental Injury: Teeth and Oropharynx as per pre-operative assessment

## 2014-07-23 ENCOUNTER — Encounter (HOSPITAL_BASED_OUTPATIENT_CLINIC_OR_DEPARTMENT_OTHER): Payer: Self-pay | Admitting: Urology

## 2014-07-23 ENCOUNTER — Encounter: Payer: Self-pay | Admitting: Oncology

## 2014-07-23 NOTE — Progress Notes (Signed)
Prepared an appeal for date of service 06/05/14 for [place]. Fwd for Dr Marko Plume to sign so I can send to [facility].

## 2014-07-27 ENCOUNTER — Other Ambulatory Visit: Payer: Self-pay | Admitting: Oncology

## 2014-07-29 ENCOUNTER — Encounter: Payer: Self-pay | Admitting: Oncology

## 2014-07-29 ENCOUNTER — Ambulatory Visit (HOSPITAL_BASED_OUTPATIENT_CLINIC_OR_DEPARTMENT_OTHER): Payer: BC Managed Care – PPO | Admitting: Oncology

## 2014-07-29 ENCOUNTER — Other Ambulatory Visit (HOSPITAL_BASED_OUTPATIENT_CLINIC_OR_DEPARTMENT_OTHER): Payer: BC Managed Care – PPO

## 2014-07-29 VITALS — BP 132/82 | HR 85 | Temp 98.6°F | Resp 18 | Ht 61.0 in | Wt 137.1 lb

## 2014-07-29 DIAGNOSIS — E876 Hypokalemia: Secondary | ICD-10-CM

## 2014-07-29 DIAGNOSIS — M7989 Other specified soft tissue disorders: Secondary | ICD-10-CM

## 2014-07-29 DIAGNOSIS — C801 Malignant (primary) neoplasm, unspecified: Secondary | ICD-10-CM

## 2014-07-29 DIAGNOSIS — C772 Secondary and unspecified malignant neoplasm of intra-abdominal lymph nodes: Secondary | ICD-10-CM

## 2014-07-29 DIAGNOSIS — C569 Malignant neoplasm of unspecified ovary: Secondary | ICD-10-CM

## 2014-07-29 DIAGNOSIS — N1330 Unspecified hydronephrosis: Secondary | ICD-10-CM

## 2014-07-29 DIAGNOSIS — I890 Lymphedema, not elsewhere classified: Secondary | ICD-10-CM

## 2014-07-29 DIAGNOSIS — Z95828 Presence of other vascular implants and grafts: Secondary | ICD-10-CM

## 2014-07-29 DIAGNOSIS — G620 Drug-induced polyneuropathy: Secondary | ICD-10-CM

## 2014-07-29 LAB — COMPREHENSIVE METABOLIC PANEL (CC13)
ALBUMIN: 3.6 g/dL (ref 3.5–5.0)
ALK PHOS: 107 U/L (ref 40–150)
ALT: 8 U/L (ref 0–55)
AST: 17 U/L (ref 5–34)
Anion Gap: 10 mEq/L (ref 3–11)
BILIRUBIN TOTAL: 0.29 mg/dL (ref 0.20–1.20)
BUN: 17.2 mg/dL (ref 7.0–26.0)
CO2: 29 mEq/L (ref 22–29)
Calcium: 9.3 mg/dL (ref 8.4–10.4)
Chloride: 102 mEq/L (ref 98–109)
Creatinine: 0.8 mg/dL (ref 0.6–1.1)
EGFR: 82 mL/min/{1.73_m2} — AB (ref >90)
GLUCOSE: 78 mg/dL (ref 70–140)
Potassium: 3.9 mEq/L (ref 3.5–5.1)
Sodium: 142 mEq/L (ref 136–145)
Total Protein: 6.6 g/dL (ref 6.4–8.3)

## 2014-07-29 LAB — CBC WITH DIFFERENTIAL/PLATELET
BASO%: 0.6 % (ref 0.0–2.0)
Basophils Absolute: 0 10*3/uL (ref 0.0–0.1)
EOS ABS: 0.1 10*3/uL (ref 0.0–0.5)
EOS%: 1.4 % (ref 0.0–7.0)
HEMATOCRIT: 40.9 % (ref 34.8–46.6)
HGB: 13.4 g/dL (ref 11.6–15.9)
LYMPH%: 18.7 % (ref 14.0–49.7)
MCH: 32 pg (ref 25.1–34.0)
MCHC: 32.8 g/dL (ref 31.5–36.0)
MCV: 97.6 fL (ref 79.5–101.0)
MONO#: 0.5 10*3/uL (ref 0.1–0.9)
MONO%: 10.8 % (ref 0.0–14.0)
NEUT%: 68.5 % (ref 38.4–76.8)
NEUTROS ABS: 3.4 10*3/uL (ref 1.5–6.5)
Platelets: 198 10*3/uL (ref 145–400)
RBC: 4.19 10*6/uL (ref 3.70–5.45)
RDW: 14.4 % (ref 11.2–14.5)
WBC: 5 10*3/uL (ref 3.9–10.3)
lymph#: 0.9 10*3/uL (ref 0.9–3.3)

## 2014-07-29 LAB — MAGNESIUM (CC13): MAGNESIUM: 2 mg/dL (ref 1.5–2.5)

## 2014-07-29 NOTE — Progress Notes (Signed)
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 Repeat CT AP 07-19-14 found slight decrease in pelvic mass to 5.8 x 6.7 x 5.9 cm compared with 6.4 x 6.5 x 6.4 cm in 02-2014 and no new involvement elsewhere (see below).  Patient had right JJ stent changed by Dr Diona Fanti on 07-22-14; discomfort has been significantly better since that change, with larger diameter stent placed. She is tolerating 5 small meals daily, each with protein. Ostomy is functioning as usual. Gross hematuria after stent change has resolved. She is going about regular activities at home.   She has PAC Flu vaccine done Right JJ stent in  [place] Patient presented with RLE swelling negative for DVT July 2014, and right hydronephrosis. 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 She had 3 additional cycles, with progressive decrease in CA 125 from 5626 in 12-2012 to 596 by Jan 2015. CT 06-14-13 had ~ stable pelvic mass and adenopathy. Dr Josephina Shih recommended another 3 cycles chemotherapy, regimen changed to q 3 week taxol carboplatin on 06-27-13, which unfortunately caused worsening of peripheral neuropathy and severe taxol aches.Taxotere was substituted for taxol due to neuropathy in feet for cycles 8 and 9, completed 08-08-13. CA 125 improved to low of 494 in Feb 2015, then 732 just prior to cycle 9. CT AP 08-22-13 had minimal improvement, still not adequate to consider debulking surgery. She received doxil for 6  cycles, from 09-19-13 thru 02-06-14. CT AP 02-27-14 had some early increase in central pelvic mass, with treatment changed to gemzar beginning 03-29-14. CA 125 baseline for gemzar was 1932 on 03-27-14. Cycle 4 gemzar was given 05-22-14, with CA 125 2680. CDDP was added to gemzar beginning 06-05-14.   Review of systems as above, also: No fever or symptoms of infection including urinary tract. No increased SOB or other respiratory symptoms. No abdominal or pelvic pain. Slight improvement in RLE swelling since stent change. No other bleeding. Remainder of 10 point Review of Systems negative.  Objective:  Vital signs in last 24 hours:  BP 132/82 mmHg  Pulse 85  Temp(Src) 98.6 F (37 C)  Resp 18  Ht 5' 1"  (1.549 m)  Wt 137 lb 1.6 oz (62.188 kg)  BMI 25.92 kg/m2  SpO2 99% Weight stable Alert, oriented and appropriate. Ambulatory without difficulty, easily mobile on and off exam table.Looks more comfortable in general Partial alopecia  HEENT:PERRL, sclerae not icteric. Oral mucosa moist without lesions, posterior pharynx clear.  Neck supple. No JVD.  Lymphatics:no cervical,supraclavicular or inguinal adenopathy Resp: clear to auscultation bilaterally and normal percussion bilaterally Cardio: regular rate and rhythm. No gallop. GI: soft, nontender, not distended, ostomy unchanged. Normally active bowel sounds.  Musculoskeletal/ Extremities: RLE not quite as tightly swollen in lower leg, otherwise without pitting edema, cords, tenderness Neuro: no increased peripheral neuropathy. Otherwise nonfocal. Mood and affect appropriate Skin without rash, ecchymosis, petechiae Portacath-without erythema or tenderness  Lab Results:  Results for orders placed or performed in visit on 07/29/14  CBC with Differential  Result Value Ref Range   WBC 5.0  3.9 - 10.3 10e3/uL   NEUT# 3.4 1.5 - 6.5 10e3/uL   HGB 13.4 11.6 - 15.9 g/dL   HCT 40.9 34.8 - 46.6 %   Platelets 198 145 - 400 10e3/uL   MCV 97.6 79.5  - 101.0 fL   MCH 32.0 25.1 - 34.0 pg   MCHC 32.8 31.5 - 36.0 g/dL   RBC 4.19 3.70 - 5.45 10e6/uL   RDW 14.4 11.2 - 14.5 %   lymph# 0.9 0.9 - 3.3 10e3/uL   MONO# 0.5 0.1 - 0.9 10e3/uL   Eosinophils Absolute 0.1 0.0 - 0.5 10e3/uL   Basophils Absolute 0.0 0.0 - 0.1 10e3/uL   NEUT% 68.5 38.4 - 76.8 %   LYMPH% 18.7 14.0 - 49.7 %   MONO% 10.8 0.0 - 14.0 %   EOS% 1.4 0.0 - 7.0 %   BASO% 0.6 0.0 - 2.0 %  Comprehensive metabolic panel (Cmet) - [facility]  Result Value Ref Range   Sodium 142 136 - 145 mEq/L   Potassium 3.9 3.5 - 5.1 mEq/L   Chloride 102 98 - 109 mEq/L   CO2 29 22 - 29 mEq/L   Glucose 78 70 - 140 mg/dl   BUN 17.2 7.0 - 26.0 mg/dL   Creatinine 0.8 0.6 - 1.1 mg/dL   Total Bilirubin 0.29 0.20 - 1.20 mg/dL   Alkaline Phosphatase 107 40 - 150 U/L   AST 17 5 - 34 U/L   ALT 8 0 - 55 U/L   Total Protein 6.6 6.4 - 8.3 g/dL   Albumin 3.6 3.5 - 5.0 g/dL   Calcium 9.3 8.4 - 10.4 mg/dL   Anion Gap 10 3 - 11 mEq/L   EGFR 82 (L) >90 ml/min/1.73 m2  Magnesium  Result Value Ref Range   Magnesium 2.0 1.5 - 2.5 mg/dl   CA 125 on 07-01-14 2401, will repeat with next labs  Studies/Results: CT ABDOMEN AND PELVIS WITH CONTRAST  TECHNIQUE: Multidetector CT imaging of the abdomen and pelvis was performed using the standard protocol following bolus administration of intravenous contrast.  CONTRAST: 180m OMNIPAQUE IOHEXOL 300 MG/ML SOLN  COMPARISON: 02/27/2014  FINDINGS: Lower chest: The lung bases are clear. No pleural or pericardial effusion.  Hepatobiliary: There is no suspicious liver abnormality. Previous cholecystectomy.  Pancreas: The pancreas appears normal.  Spleen: Normal appearance of the spleen.  Adrenals/Urinary Tract: The adrenal glands are both normal. The left kidney is unremarkable. Right-sided hydronephrosis is again identified. A nephro ureteral stent is in place. The degree of right hydronephrosis is similar to the previous exam. Thick walled  urinary bladder is identified which may in part reflect incomplete distention.  Stomach/Bowel: The stomach and the small bowel loops have a normal course and caliber. The patient is status post colectomy. 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  Vascular/Lymphatic: Normal appearance of the abdominal aorta. No enlarged lymph nodes identified within the upper abdomen. No iliac or inguinal adenopathy.  Reproductive: Heterogeneously enhancing pelvic mass contiguous with the uterus measures 5.8 x 6.7 x 5.9 cm. Previously this measured 6.4 x 6.5 x 6.4 cm.  Other: No ascites. No focal fluid collections.  Musculoskeletal: No aggressive lytic or sclerotic bone lesions identified.  IMPRESSION: 1. No acute findings. 2. Stable to slight decrease in size of pelvic mass. 3. Persistent right hydronephrosis secondary to ureteral obstruction. The degree of obstructive uropathy is stable when compared with prior exam and the ureteral stent remains in appropriate position.  Patient had been informed re  CT by phone, this information reviewed now and have shown her the PACs images  Medications: I have reviewed the patient's current medications. VIcodin refilled, used just prn but needs to have available  DISCUSSION: Appreciate Dr Dahlstedt's care. CT as above. She appears stable to proceed with chemo on 07-31-14 as planned, and she is in full agreement with this. 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 Doxil x 6 cycles given from 09-19-13 thru 02-06-14. Slight progression in pelvic mass by scans after doxil. Gemzar x 4 cycles from 03-27-14 thru 05-23-15, with addition of CDDP  beginning 06-05-14. (Daughter off work on Wednesdays, so that day best for treatments). Cycle 3 delayed for stent change, to be given 07-31-14. 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 4.post total colectomy for ulcerative colitis 1990, with ileostomy. She subsequently had rectum also removed 5.multiple blood transfusions with ulcerative colitis previously, and during this chemo  6. 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 8.rash to PCN  9. Extremely sensitive to oral contrast with CTs causing diarrhea, needs water based oral contrast regimen in radiology. Hypokalemia: related to ileostomy output, generally manages with diet.  10.RLE swelling: no DVT by prior evaluations     Chemo orders confirmed, patient well aware of oral hydration needs.  Time spent 25 min including >50% counseling and coordination of care.    Gordy Levan, MD   07/29/2014, 2:38 PM

## 2014-07-30 ENCOUNTER — Telehealth: Payer: Self-pay | Admitting: Oncology

## 2014-07-30 ENCOUNTER — Telehealth: Payer: Self-pay | Admitting: *Deleted

## 2014-07-30 ENCOUNTER — Encounter: Payer: Self-pay | Admitting: *Deleted

## 2014-07-30 ENCOUNTER — Other Ambulatory Visit: Payer: Self-pay | Admitting: *Deleted

## 2014-07-30 DIAGNOSIS — C772 Secondary and unspecified malignant neoplasm of intra-abdominal lymph nodes: Secondary | ICD-10-CM

## 2014-07-30 MED ORDER — HYDROCODONE-ACETAMINOPHEN 5-325 MG PO TABS: ORAL_TABLET | ORAL | Status: DC

## 2014-07-30 NOTE — Telephone Encounter (Signed)
Per staff message and POF I have scheduled appts. Advised scheduler of appts. JMW  

## 2014-07-30 NOTE — Telephone Encounter (Signed)
appts made per pof and pt will get a new sch/avs at end of chemo 3/9

## 2014-07-31 ENCOUNTER — Ambulatory Visit (HOSPITAL_BASED_OUTPATIENT_CLINIC_OR_DEPARTMENT_OTHER): Payer: BLUE CROSS/BLUE SHIELD

## 2014-07-31 DIAGNOSIS — D801 Nonfamilial hypogammaglobulinemia: Secondary | ICD-10-CM | POA: Diagnosis not present

## 2014-07-31 DIAGNOSIS — C772 Secondary and unspecified malignant neoplasm of intra-abdominal lymph nodes: Secondary | ICD-10-CM

## 2014-07-31 DIAGNOSIS — C569 Malignant neoplasm of unspecified ovary: Secondary | ICD-10-CM

## 2014-07-31 DIAGNOSIS — Z5111 Encounter for antineoplastic chemotherapy: Secondary | ICD-10-CM

## 2014-07-31 MED ORDER — SODIUM CHLORIDE 0.9 % IV SOLN
Freq: Once | INTRAVENOUS | Status: AC
  Administered 2014-07-31: 11:00:00 via INTRAVENOUS
  Filled 2014-07-31: qty 4

## 2014-07-31 MED ORDER — SODIUM CHLORIDE 0.9 % IV SOLN
Freq: Once | INTRAVENOUS | Status: AC
  Administered 2014-07-31: 08:00:00 via INTRAVENOUS

## 2014-07-31 MED ORDER — SODIUM CHLORIDE 0.9 % IV SOLN
30.5000 mg/m2 | Freq: Once | INTRAVENOUS | Status: AC
  Administered 2014-07-31: 50 mg via INTRAVENOUS
  Filled 2014-07-31: qty 50

## 2014-07-31 MED ORDER — SODIUM CHLORIDE 0.9 % IV SOLN: Freq: Once | INTRAVENOUS | Status: DC

## 2014-07-31 MED ORDER — SODIUM CHLORIDE 0.9 % IV SOLN
600.0000 mg/m2 | Freq: Once | INTRAVENOUS | Status: AC
  Administered 2014-07-31: 988 mg via INTRAVENOUS
  Filled 2014-07-31: qty 25.98

## 2014-07-31 MED ORDER — POTASSIUM CHLORIDE 2 MEQ/ML IV SOLN
Freq: Once | INTRAVENOUS | Status: AC
  Administered 2014-07-31: 09:00:00 via INTRAVENOUS
  Filled 2014-07-31: qty 10

## 2014-07-31 MED ORDER — SODIUM CHLORIDE 0.9 % IJ SOLN
10.0000 mL | INTRAMUSCULAR | Status: DC | PRN
  Administered 2014-07-31: 10 mL
  Filled 2014-07-31: qty 10

## 2014-07-31 MED ORDER — HEPARIN SOD (PORK) LOCK FLUSH 100 UNIT/ML IV SOLN
500.0000 [IU] | Freq: Once | INTRAVENOUS | Status: AC | PRN
Start: 2014-07-31 — End: 2014-07-31
  Administered 2014-07-31: 500 [IU]
  Filled 2014-07-31: qty 5

## 2014-07-31 MED ORDER — SODIUM CHLORIDE 0.9 % IV SOLN
Freq: Once | INTRAVENOUS | Status: AC
  Administered 2014-07-31: 11:00:00 via INTRAVENOUS
  Filled 2014-07-31: qty 5

## 2014-07-31 NOTE — Patient Instructions (Signed)
[facility] Discharge Instructions for Patients Receiving Chemotherapy  Today you received the following chemotherapy agents: Cisplatin and Gemzar.   To help prevent nausea and vomiting after your treatment, we encourage you to take your nausea medication as directed.    If you develop nausea and vomiting that is not controlled by your nausea medication, call the clinic.   BELOW ARE SYMPTOMS THAT SHOULD BE REPORTED IMMEDIATELY:  *FEVER GREATER THAN 100.5 F  *CHILLS WITH OR WITHOUT FEVER  NAUSEA AND VOMITING THAT IS NOT CONTROLLED WITH YOUR NAUSEA MEDICATION  *UNUSUAL SHORTNESS OF BREATH  *UNUSUAL BRUISING OR BLEEDING  TENDERNESS IN MOUTH AND THROAT WITH OR WITHOUT PRESENCE OF ULCERS  *URINARY PROBLEMS  *BOWEL PROBLEMS  UNUSUAL RASH Items with * indicate a potential emergency and should be followed up as soon as possible.  Feel free to call the clinic you have any questions or concerns. The clinic phone number is [phone].

## 2014-08-07 ENCOUNTER — Telehealth: Payer: Self-pay | Admitting: *Deleted

## 2014-08-07 NOTE — Telephone Encounter (Signed)
Patient called to say she is having burning pain on urination.  She received CDDP on week ago and is concerned that could be the cause.  She has a stent placed by Dr. Shirley Muscat in February.  Recommended she call Dr. Gillermina Phy office to report symptoms given his specialty and her professional relationship with him.  Encouraged her to call back if she has any trouble getting assistance from his office.

## 2014-08-07 NOTE — Telephone Encounter (Signed)
Spoke with Kirsten Schroeder.  She is afebrile.  She is uncomfortable from urinary discomfort but tolerable.  She does have an appointment at  Dr. Gillermina Phy  Office with a Development worker, international aid.  She will call back ~3-18 with plan from appointment.

## 2014-08-09 ENCOUNTER — Telehealth: Payer: Self-pay | Admitting: *Deleted

## 2014-08-09 NOTE — Telephone Encounter (Signed)
PT. HAS AN INFLAMED URINARY TRACT. SHE WAS TAKEN OFF VICODIN AND TYLENOL. SHE IS TO TAKE ADVIL AND DIAZEPAM 2MG  A DAY UNTIL SHE FEELS BETTER. NO RETURN VISIT NECESSARY.

## 2014-08-14 ENCOUNTER — Other Ambulatory Visit (HOSPITAL_BASED_OUTPATIENT_CLINIC_OR_DEPARTMENT_OTHER): Payer: BLUE CROSS/BLUE SHIELD

## 2014-08-14 ENCOUNTER — Ambulatory Visit (HOSPITAL_BASED_OUTPATIENT_CLINIC_OR_DEPARTMENT_OTHER): Payer: BLUE CROSS/BLUE SHIELD

## 2014-08-14 DIAGNOSIS — Z5111 Encounter for antineoplastic chemotherapy: Secondary | ICD-10-CM

## 2014-08-14 DIAGNOSIS — C801 Malignant (primary) neoplasm, unspecified: Secondary | ICD-10-CM | POA: Diagnosis not present

## 2014-08-14 DIAGNOSIS — C569 Malignant neoplasm of unspecified ovary: Secondary | ICD-10-CM

## 2014-08-14 LAB — CBC WITH DIFFERENTIAL/PLATELET
BASO%: 0.5 % (ref 0.0–2.0)
Basophils Absolute: 0 10*3/uL (ref 0.0–0.1)
EOS%: 3.6 % (ref 0.0–7.0)
Eosinophils Absolute: 0.1 10*3/uL (ref 0.0–0.5)
HEMATOCRIT: 35.6 % (ref 34.8–46.6)
HGB: 11.6 g/dL (ref 11.6–15.9)
LYMPH%: 20.5 % (ref 14.0–49.7)
MCH: 31.1 pg (ref 25.1–34.0)
MCHC: 32.6 g/dL (ref 31.5–36.0)
MCV: 95.5 fL (ref 79.5–101.0)
MONO#: 0.4 10*3/uL (ref 0.1–0.9)
MONO%: 17.2 % — AB (ref 0.0–14.0)
NEUT#: 1.4 10*3/uL — ABNORMAL LOW (ref 1.5–6.5)
NEUT%: 58.2 % (ref 38.4–76.8)
Platelets: 152 10*3/uL (ref 145–400)
RBC: 3.73 10*6/uL (ref 3.70–5.45)
RDW: 15.2 % — ABNORMAL HIGH (ref 11.2–14.5)
WBC: 2.4 10*3/uL — ABNORMAL LOW (ref 3.9–10.3)
lymph#: 0.5 10*3/uL — ABNORMAL LOW (ref 0.9–3.3)

## 2014-08-14 LAB — COMPREHENSIVE METABOLIC PANEL (CC13)
ALT: 11 U/L (ref 0–55)
AST: 13 U/L (ref 5–34)
Albumin: 3.4 g/dL — ABNORMAL LOW (ref 3.5–5.0)
Alkaline Phosphatase: 105 U/L (ref 40–150)
Anion Gap: 8 mEq/L (ref 3–11)
BILIRUBIN TOTAL: 0.37 mg/dL (ref 0.20–1.20)
BUN: 13.5 mg/dL (ref 7.0–26.0)
CO2: 29 meq/L (ref 22–29)
Calcium: 10 mg/dL (ref 8.4–10.4)
Chloride: 105 mEq/L (ref 98–109)
Creatinine: 0.8 mg/dL (ref 0.6–1.1)
EGFR: 83 mL/min/{1.73_m2} — AB (ref >90)
GLUCOSE: 98 mg/dL (ref 70–140)
Potassium: 3.7 mEq/L (ref 3.5–5.1)
Sodium: 142 mEq/L (ref 136–145)
Total Protein: 6.3 g/dL — ABNORMAL LOW (ref 6.4–8.3)

## 2014-08-14 LAB — MAGNESIUM (CC13): Magnesium: 1.8 mg/dl (ref 1.5–2.5)

## 2014-08-14 MED ORDER — HEPARIN SOD (PORK) LOCK FLUSH 100 UNIT/ML IV SOLN
500.0000 [IU] | Freq: Once | INTRAVENOUS | Status: AC | PRN
Start: 2014-08-14 — End: 2014-08-14
  Administered 2014-08-14: 500 [IU]
  Filled 2014-08-14: qty 5

## 2014-08-14 MED ORDER — FOSAPREPITANT DIMEGLUMINE INJECTION 150 MG
Freq: Once | INTRAVENOUS | Status: AC
  Administered 2014-08-14: 13:00:00 via INTRAVENOUS
  Filled 2014-08-14: qty 5

## 2014-08-14 MED ORDER — SODIUM CHLORIDE 0.9 % IV SOLN
Freq: Once | INTRAVENOUS | Status: AC
  Administered 2014-08-14: 10:00:00 via INTRAVENOUS

## 2014-08-14 MED ORDER — POTASSIUM CHLORIDE 2 MEQ/ML IV SOLN
Freq: Once | INTRAVENOUS | Status: AC
  Administered 2014-08-14: 10:00:00 via INTRAVENOUS
  Filled 2014-08-14: qty 10

## 2014-08-14 MED ORDER — SODIUM CHLORIDE 0.9 % IV SOLN
Freq: Once | INTRAVENOUS | Status: AC
  Administered 2014-08-14: 12:00:00 via INTRAVENOUS
  Filled 2014-08-14: qty 4

## 2014-08-14 MED ORDER — SODIUM CHLORIDE 0.9 % IJ SOLN
10.0000 mL | INTRAMUSCULAR | Status: DC | PRN
  Administered 2014-08-14: 10 mL
  Filled 2014-08-14: qty 10

## 2014-08-14 MED ORDER — GEMCITABINE HCL CHEMO INJECTION 1 GM/26.3ML
600.0000 mg/m2 | Freq: Once | INTRAVENOUS | Status: AC
  Administered 2014-08-14: 988 mg via INTRAVENOUS
  Filled 2014-08-14: qty 25.98

## 2014-08-14 MED ORDER — SODIUM CHLORIDE 0.9 % IV SOLN
30.5000 mg/m2 | Freq: Once | INTRAVENOUS | Status: AC
  Administered 2014-08-14: 50 mg via INTRAVENOUS
  Filled 2014-08-14: qty 50

## 2014-08-14 NOTE — Patient Instructions (Signed)
Follow up with your urologist.  [facility] Discharge Instructions for Patients Receiving Chemotherapy  Today you received the following chemotherapy agents: Gemzar and Cisplatin.   To help prevent nausea and vomiting after your treatment, we encourage you to take your nausea medication as directed.    If you develop nausea and vomiting that is not controlled by your nausea medication, call the clinic.   BELOW ARE SYMPTOMS THAT SHOULD BE REPORTED IMMEDIATELY:  *FEVER GREATER THAN 100.5 F  *CHILLS WITH OR WITHOUT FEVER  NAUSEA AND VOMITING THAT IS NOT CONTROLLED WITH YOUR NAUSEA MEDICATION  *UNUSUAL SHORTNESS OF BREATH  *UNUSUAL BRUISING OR BLEEDING  TENDERNESS IN MOUTH AND THROAT WITH OR WITHOUT PRESENCE OF ULCERS  *URINARY PROBLEMS  *BOWEL PROBLEMS  UNUSUAL RASH Items with * indicate a potential emergency and should be followed up as soon as possible.  Feel free to call the clinic you have any questions or concerns. The clinic phone number is [phone].  Please show the [place] at check-in to the Emergency Department and triage nurse.

## 2014-08-14 NOTE — Progress Notes (Signed)
Pt reports burning with urination. States she saw a PA at her urologist Dr. Alan Ripper office on 3/17, states xray showed stent was in place and inflammation to urinary tract. Reports they took her off of her vicodin and started her on 400 mg of ibuprofen tid and 1 mg of diazepam daily.    Dr. Alvy Bimler reviewed patient's labs and notified of patient's reports of burning with urination. Okay to proceed with treatment today and instructed patient to follow-up again with her urologist. Pt verbalized understanding.

## 2014-08-15 LAB — CA 125: CA 125: 1234 U/mL — ABNORMAL HIGH (ref <35)

## 2014-08-19 ENCOUNTER — Other Ambulatory Visit: Payer: Self-pay | Admitting: Oncology

## 2014-08-19 ENCOUNTER — Telehealth: Payer: Self-pay

## 2014-08-19 DIAGNOSIS — C569 Malignant neoplasm of unspecified ovary: Secondary | ICD-10-CM

## 2014-08-19 NOTE — Telephone Encounter (Signed)
Patient set up for lab and injection on 08-20-14.

## 2014-08-19 NOTE — Telephone Encounter (Signed)
-----   Message from Gordy Levan, MD sent at 08/19/2014 10:46 AM EDT ----- Labs seen and need follow up: treated CDDP gem on 3-23, ANC a little low that day at 1.4. Next visit LL 4-4. Needs CBC possible granix today or 3-29. Granix orders in with comments now, POF not done until RN speaks with patient.

## 2014-08-19 NOTE — Telephone Encounter (Signed)
Told Kirsten Schroeder that her Ca-125 was down to 1234on 08-14-14 from 2401 on 07-01-14 per Dr. Marko Plume.  Pt. Pleased.

## 2014-08-20 ENCOUNTER — Other Ambulatory Visit (HOSPITAL_BASED_OUTPATIENT_CLINIC_OR_DEPARTMENT_OTHER): Payer: BLUE CROSS/BLUE SHIELD

## 2014-08-20 ENCOUNTER — Ambulatory Visit: Payer: BLUE CROSS/BLUE SHIELD

## 2014-08-20 ENCOUNTER — Encounter: Payer: Self-pay | Admitting: Internal Medicine

## 2014-08-20 DIAGNOSIS — C801 Malignant (primary) neoplasm, unspecified: Secondary | ICD-10-CM | POA: Diagnosis not present

## 2014-08-20 DIAGNOSIS — C569 Malignant neoplasm of unspecified ovary: Secondary | ICD-10-CM

## 2014-08-20 LAB — CBC WITH DIFFERENTIAL/PLATELET
BASO%: 0.4 % (ref 0.0–2.0)
Basophils Absolute: 0 10*3/uL (ref 0.0–0.1)
EOS%: 0.5 % (ref 0.0–7.0)
Eosinophils Absolute: 0 10*3/uL (ref 0.0–0.5)
HCT: 33.6 % — ABNORMAL LOW (ref 34.8–46.6)
HEMOGLOBIN: 11.1 g/dL — AB (ref 11.6–15.9)
LYMPH%: 21.8 % (ref 14.0–49.7)
MCH: 30.9 pg (ref 25.1–34.0)
MCHC: 33.1 g/dL (ref 31.5–36.0)
MCV: 93.5 fL (ref 79.5–101.0)
MONO#: 0.1 10*3/uL (ref 0.1–0.9)
MONO%: 5.5 % (ref 0.0–14.0)
NEUT%: 71.8 % (ref 38.4–76.8)
NEUTROS ABS: 1.5 10*3/uL (ref 1.5–6.5)
Platelets: 253 10*3/uL (ref 145–400)
RBC: 3.59 10*6/uL — AB (ref 3.70–5.45)
RDW: 14.9 % — ABNORMAL HIGH (ref 11.2–14.5)
WBC: 2.1 10*3/uL — AB (ref 3.9–10.3)
lymph#: 0.4 10*3/uL — ABNORMAL LOW (ref 0.9–3.3)

## 2014-08-20 MED ORDER — TBO-FILGRASTIM 300 MCG/0.5ML ~~LOC~~ SOSY
300.0000 ug | PREFILLED_SYRINGE | Freq: Once | SUBCUTANEOUS | Status: DC
  Filled 2014-08-20: qty 0.5

## 2014-08-20 NOTE — Progress Notes (Signed)
I refaxed appeal to [phone] [employer] for [place] date 06/05/14. We have approval starting 06/19/14-06/19/15

## 2014-08-20 NOTE — Progress Notes (Signed)
If ANC <1.5 she was to receive Granix.  ANC 1.5 today   No granix per Dr Marko Plume.

## 2014-08-24 ENCOUNTER — Other Ambulatory Visit: Payer: Self-pay | Admitting: Oncology

## 2014-08-26 ENCOUNTER — Ambulatory Visit (HOSPITAL_BASED_OUTPATIENT_CLINIC_OR_DEPARTMENT_OTHER): Payer: BLUE CROSS/BLUE SHIELD | Admitting: Oncology

## 2014-08-26 ENCOUNTER — Telehealth: Payer: Self-pay | Admitting: Oncology

## 2014-08-26 ENCOUNTER — Other Ambulatory Visit (HOSPITAL_BASED_OUTPATIENT_CLINIC_OR_DEPARTMENT_OTHER): Payer: BLUE CROSS/BLUE SHIELD

## 2014-08-26 ENCOUNTER — Ambulatory Visit (HOSPITAL_BASED_OUTPATIENT_CLINIC_OR_DEPARTMENT_OTHER): Payer: BLUE CROSS/BLUE SHIELD

## 2014-08-26 ENCOUNTER — Encounter: Payer: Self-pay | Admitting: Oncology

## 2014-08-26 VITALS — BP 141/110 | HR 120 | Temp 99.5°F | Resp 18 | Ht 61.0 in | Wt 131.7 lb

## 2014-08-26 VITALS — BP 137/66 | HR 81 | Temp 99.0°F | Resp 18

## 2014-08-26 DIAGNOSIS — C801 Malignant (primary) neoplasm, unspecified: Secondary | ICD-10-CM

## 2014-08-26 DIAGNOSIS — R300 Dysuria: Secondary | ICD-10-CM | POA: Diagnosis not present

## 2014-08-26 DIAGNOSIS — D701 Agranulocytosis secondary to cancer chemotherapy: Secondary | ICD-10-CM

## 2014-08-26 DIAGNOSIS — D709 Neutropenia, unspecified: Secondary | ICD-10-CM

## 2014-08-26 DIAGNOSIS — G893 Neoplasm related pain (acute) (chronic): Secondary | ICD-10-CM | POA: Diagnosis not present

## 2014-08-26 DIAGNOSIS — C569 Malignant neoplasm of unspecified ovary: Secondary | ICD-10-CM

## 2014-08-26 DIAGNOSIS — R52 Pain, unspecified: Secondary | ICD-10-CM | POA: Diagnosis not present

## 2014-08-26 DIAGNOSIS — Z5189 Encounter for other specified aftercare: Secondary | ICD-10-CM | POA: Diagnosis not present

## 2014-08-26 DIAGNOSIS — T451X5A Adverse effect of antineoplastic and immunosuppressive drugs, initial encounter: Secondary | ICD-10-CM | POA: Insufficient documentation

## 2014-08-26 LAB — COMPREHENSIVE METABOLIC PANEL (CC13)
ALBUMIN: 3 g/dL — AB (ref 3.5–5.0)
ALT: 14 U/L (ref 0–55)
AST: 14 U/L (ref 5–34)
Alkaline Phosphatase: 97 U/L (ref 40–150)
Anion Gap: 14 mEq/L — ABNORMAL HIGH (ref 3–11)
BUN: 6.9 mg/dL — AB (ref 7.0–26.0)
CALCIUM: 9.6 mg/dL (ref 8.4–10.4)
CHLORIDE: 101 meq/L (ref 98–109)
CO2: 25 mEq/L (ref 22–29)
Creatinine: 0.8 mg/dL (ref 0.6–1.1)
EGFR: 86 mL/min/{1.73_m2} — ABNORMAL LOW (ref >90)
Glucose: 124 mg/dl (ref 70–140)
Potassium: 3.9 mEq/L (ref 3.5–5.1)
Sodium: 139 mEq/L (ref 136–145)
TOTAL PROTEIN: 6.6 g/dL (ref 6.4–8.3)
Total Bilirubin: 0.41 mg/dL (ref 0.20–1.20)

## 2014-08-26 LAB — CBC WITH DIFFERENTIAL/PLATELET
BASO%: 0.9 % (ref 0.0–2.0)
BASOS ABS: 0 10*3/uL (ref 0.0–0.1)
EOS ABS: 0 10*3/uL (ref 0.0–0.5)
EOS%: 1.2 % (ref 0.0–7.0)
HEMATOCRIT: 32 % — AB (ref 34.8–46.6)
HEMOGLOBIN: 10.4 g/dL — AB (ref 11.6–15.9)
LYMPH%: 24.6 % (ref 14.0–49.7)
MCH: 30.1 pg (ref 25.1–34.0)
MCHC: 32.6 g/dL (ref 31.5–36.0)
MCV: 92.3 fL (ref 79.5–101.0)
MONO#: 0.6 10*3/uL (ref 0.1–0.9)
MONO%: 32.9 % — AB (ref 0.0–14.0)
NEUT%: 40.4 % (ref 38.4–76.8)
NEUTROS ABS: 0.8 10*3/uL — AB (ref 1.5–6.5)
PLATELETS: 127 10*3/uL — AB (ref 145–400)
RBC: 3.47 10*6/uL — ABNORMAL LOW (ref 3.70–5.45)
RDW: 14.9 % — ABNORMAL HIGH (ref 11.2–14.5)
WBC: 1.9 10*3/uL — AB (ref 3.9–10.3)
lymph#: 0.5 10*3/uL — ABNORMAL LOW (ref 0.9–3.3)

## 2014-08-26 LAB — URINALYSIS, MICROSCOPIC - CHCC
BILIRUBIN (URINE): NEGATIVE
GLUCOSE UR CHCC: NEGATIVE mg/dL
Ketones: NEGATIVE mg/dL
Nitrite: NEGATIVE
Protein: 300 mg/dL
Specific Gravity, Urine: 1.02 (ref 1.003–1.035)
Urobilinogen, UR: 0.2 mg/dL (ref 0.2–1)
pH: 6 (ref 4.6–8.0)

## 2014-08-26 LAB — MAGNESIUM (CC13): MAGNESIUM: 1.7 mg/dL (ref 1.5–2.5)

## 2014-08-26 MED ORDER — MORPHINE SULFATE 4 MG/ML IJ SOLN
2.0000 mg | Freq: Once | INTRAMUSCULAR | Status: AC
  Administered 2014-08-26: 2 mg via INTRAVENOUS

## 2014-08-26 MED ORDER — HEPARIN SOD (PORK) LOCK FLUSH 100 UNIT/ML IV SOLN
500.0000 [IU] | Freq: Once | INTRAVENOUS | Status: AC
  Administered 2014-08-26: 500 [IU] via INTRAVENOUS
  Filled 2014-08-26: qty 5

## 2014-08-26 MED ORDER — MORPHINE SULFATE 15 MG PO TABS: 7.5000 mg | ORAL_TABLET | ORAL | Status: DC | PRN

## 2014-08-26 MED ORDER — SODIUM CHLORIDE 0.9 % IV SOLN
Freq: Once | INTRAVENOUS | Status: AC
  Administered 2014-08-26: 15:00:00 via INTRAVENOUS

## 2014-08-26 MED ORDER — SODIUM CHLORIDE 0.9 % IJ SOLN
10.0000 mL | INTRAMUSCULAR | Status: DC | PRN
  Administered 2014-08-26: 10 mL via INTRAVENOUS
  Filled 2014-08-26: qty 10

## 2014-08-26 MED ORDER — SODIUM CHLORIDE 0.9 % IV SOLN: INTRAVENOUS | Status: DC

## 2014-08-26 MED ORDER — MORPHINE SULFATE 4 MG/ML IJ SOLN
INTRAMUSCULAR | Status: AC
  Filled 2014-08-26: qty 1

## 2014-08-26 MED ORDER — TBO-FILGRASTIM 300 MCG/0.5ML ~~LOC~~ SOSY
300.0000 ug | PREFILLED_SYRINGE | Freq: Once | SUBCUTANEOUS | Status: AC
  Administered 2014-08-26: 300 ug via SUBCUTANEOUS
  Filled 2014-08-26: qty 0.5

## 2014-08-26 MED ORDER — CIPROFLOXACIN HCL 250 MG PO TABS
250.0000 mg | ORAL_TABLET | Freq: Two times a day (BID) | ORAL | Status: DC
Start: 2014-08-26 — End: 2014-09-09

## 2014-08-26 MED ORDER — MORPHINE SULFATE 4 MG/ML IJ SOLN
2.0000 mg | Freq: Once | INTRAMUSCULAR | Status: AC | PRN
  Administered 2014-08-26: 2 mg via INTRAVENOUS

## 2014-08-26 MED ORDER — SODIUM CHLORIDE 0.9 % IV SOLN
Freq: Once | INTRAVENOUS | Status: AC
  Administered 2014-08-26: 15:00:00 via INTRAVENOUS
  Filled 2014-08-26: qty 4

## 2014-08-26 NOTE — Progress Notes (Signed)
OFFICE PROGRESS NOTE   August 26, 2014   Physicians:ClarkePearson, Quillian Quince; Evalee Mutton, Marlene Bast, Lorriane Shire; Dahlstedt, Annie Main  INTERVAL HISTORY:   Patient is seen in continuing attention to advanced gyn carcinoma, very uncomfortable since #4 CDDP gemzar on 08-14-14, and neutropenic today. She is accompanied to office by brother and daughter. She has not needed gCSF with this chemo previously. She was seen by urology shortly prior to 08-14-14 chemo. Last CT AP was 07-19-14.  Patient reports abdominal pain and perineal pain "since last chemo", not improved with OTC NSAID several times daily. She has increased bladder discomfort with voiding, no fever, no hematuria, some nausea, poor po intake. She had CBC checked on 08-20-14, did not need gCSF then. She spoke with on call nurse on 08-23-14, but preferred not to go to ED over weekend. She has used desitin and aquaphor lotions to perineum. Bowels are moving as usual via colostomy. Per daughter, patient has hardly been out of bed and walks bent over due to pain. She has had no recent documentation of UTI; she had right JJ stent changed on 07-22-14.   She has PAC Flu vaccine done Right JJ stent in  [place] Patient presented with RLE swelling negative for DVT July 2014, and right hydronephrosis. 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 She had 3 additional cycles, with progressive decrease in CA 125 from 5626 in 12-2012 to 596 by Jan 2015. CT 06-14-13 had ~ stable pelvic mass and adenopathy. Dr Josephina Shih recommended another 3 cycles chemotherapy, regimen changed to q 3 week taxol carboplatin on 06-27-13, which unfortunately caused worsening of peripheral neuropathy and severe taxol aches.Taxotere was substituted for taxol due to neuropathy in feet for cycles  8 and 9, completed 08-08-13. CA 125 improved to low of 494 in Feb 2015, then 732 just prior to cycle 9. CT AP 08-22-13 had minimal improvement, still not adequate to consider debulking surgery. She received doxil for 6 cycles, from 09-19-13 thru 02-06-14. CT AP 02-27-14 had some early increase in central pelvic mass, with treatment changed to gemzar beginning 03-29-14. CA 125 baseline for gemzar was 1932 on 03-27-14. Cycle 4 gemzar was given 05-22-14, with CA 125 2680. CDDP was added to gemzar beginning 06-05-14.     Review of systems as above, also:  No SOB or cough. Much less swelling RLE. No other bleeding. No problems with PAC. Remainder of 10 point Review of Systems negative.  Objective:  Vital signs in last 24 hours:  BP 141/110 mmHg  Pulse 120  Temp(Src) 99.5 F (37.5 C) (Oral)  Resp 18  Ht 5' 1"  (1.549 m)  Wt 131 lb 11.2 oz (59.739 kg)  BMI 24.90 kg/m2  SpO2 98%  Alert, oriented and appropriate, appears in discomfort but not acute distress. Ambulatory in exam.  Alopecia  HEENT:PERRL, sclerae not icteric. Oral mucosa somewhat dry without lesions, posterior pharynx clear.  Neck supple. No JVD.  Lymphatics:no cervical,supraclavicular, adenopathy Resp: clear to auscultation bilaterally  Cardio:tachy, regular rate and rhythm. No gallop. GI: soft, not distended, liquid brown stool in ostomy bag. Normally active bowel sounds. Not tender. Perineum without rash, erythema, or obvious yeast, but sensitive to touch. Musculoskeletal/ Extremities: RLE 1+ pedal edema only, otherwise without pitting edema, cords, tenderness Neuro: speech fluent, moves all extremities. Skin without rash, ecchymosis, petechiae Portacath-without erythema or tenderness   Patient seen  again by MD in infusion area after 500 cc NS, IV morphine and zofran - appears stable and much more comfortable.  Lab Results:  Results for orders placed or performed in visit on 08/26/14  CBC with Differential  Result Value Ref  Range   WBC 1.9 (L) 3.9 - 10.3 10e3/uL   NEUT# 0.8 (L) 1.5 - 6.5 10e3/uL   HGB 10.4 (L) 11.6 - 15.9 g/dL   HCT 32.0 (L) 34.8 - 46.6 %   Platelets 127 (L) 145 - 400 10e3/uL   MCV 92.3 79.5 - 101.0 fL   MCH 30.1 25.1 - 34.0 pg   MCHC 32.6 31.5 - 36.0 g/dL   RBC 3.47 (L) 3.70 - 5.45 10e6/uL   RDW 14.9 (H) 11.2 - 14.5 %   lymph# 0.5 (L) 0.9 - 3.3 10e3/uL   MONO# 0.6 0.1 - 0.9 10e3/uL   Eosinophils Absolute 0.0 0.0 - 0.5 10e3/uL   Basophils Absolute 0.0 0.0 - 0.1 10e3/uL   NEUT% 40.4 38.4 - 76.8 %   LYMPH% 24.6 14.0 - 49.7 %   MONO% 32.9 (H) 0.0 - 14.0 %   EOS% 1.2 0.0 - 7.0 %   BASO% 0.9 0.0 - 2.0 %  Comprehensive metabolic panel (Cmet) - [facility]  Result Value Ref Range   Sodium 139 136 - 145 mEq/L   Potassium 3.9 3.5 - 5.1 mEq/L   Chloride 101 98 - 109 mEq/L   CO2 25 22 - 29 mEq/L   Glucose 124 70 - 140 mg/dl   BUN 6.9 (L) 7.0 - 26.0 mg/dL   Creatinine 0.8 0.6 - 1.1 mg/dL   Total Bilirubin 0.41 0.20 - 1.20 mg/dL   Alkaline Phosphatase 97 40 - 150 U/L   AST 14 5 - 34 U/L   ALT 14 0 - 55 U/L   Total Protein 6.6 6.4 - 8.3 g/dL   Albumin 3.0 (L) 3.5 - 5.0 g/dL   Calcium 9.6 8.4 - 10.4 mg/dL   Anion Gap 14 (H) 3 - 11 mEq/L   EGFR 86 (L) >90 ml/min/1.73 m2  Magnesium  Result Value Ref Range   Magnesium 1.7 1.5 - 2.5 mg/dl     UA sp gr 1.020, large blood, large LE, TNTC RBC, TNTC WBC, moderate bact. Culture pending.   Studies/Results:  No results found.  Medications: I have reviewed the patient's current medications. She tolerated morphine for active ulcerative colitis in past. She will stop advil/ ibuprofen. Cipro 250 mg bid until urine culture results available and no longer neutropenic. MD spoke directly with patient's pharmacy, confirmed that they had MSIR available, prescription given for MSIR 15 mg 1/2 - 1 tablet every 4 hrs prn severe pain #10.  Granix 300 mg given.  No rash on perineum, but if superficial pain continues may try acyclovir.  DISCUSSION: offered patient  hospital admission or outpatient intervention now, elected outpatient if possible. She was given IV NS 1 liter with IV zofran, IV morphine 2 mg with significant improvement in all symptoms. Repeat vitals had BP 137/66, HR 81 and temp 99. Patient and daughter instructed on neutropenic precautions.  Patient will return to clinic on 08-27-14 for additional IV NS/ prn zofran /prn IV morphine, and will have granix again if [place] <1.2. She will NOT have chemo on 08-28-14, tho that infusion time not cancelled in case additional IVF needed on 4-6; ok to cancel if she is much improved on 4-5. I will see her 4-7 if any problems then.  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 Hold cycle 5 gemzar CDDP 08-28-14 due to neutropenia and uncontrolled pain. Symptoms better with interventions at office now, prn MSIR available. IVF also 08-27-14 2.neutropenia: granix today, then additional doses depending on counts 4-5 and later this week. Neutropenic precautions: will need IV antibiotics for temp >=100.5. Cipro begin this afternoon, until urine culture results and recovery of ANC. Note temp 99 - 99.5 at office today 3.right JJ stent: Not clear to me that all symptoms are from stent 4.history of ulcerative colitis post total colectomy with ileostomy 5.previous UTIs related to stent 6.PAC in 7.peripheral neuropathy in feet related to taxanes: improved but not resolved 8.allergy PCN    Patient and daughter understand all instructions including for neutropenia and are in agreement with  plans above.  Time spent 35 min including >50% counseling and coordination of care.  Gordy Levan, MD   08/26/2014, 2:38 PM

## 2014-08-26 NOTE — Patient Instructions (Signed)
Dehydration, Adult Dehydration is when you lose more fluids from the body than you take in. Vital organs like the kidneys, brain, and heart cannot function without a proper amount of fluids and salt. Any loss of fluids from the body can cause dehydration.  CAUSES   Vomiting.  Diarrhea.  Excessive sweating.  Excessive urine output.  Fever. SYMPTOMS  Mild dehydration  Thirst.  Dry lips.  Slightly dry mouth. Moderate dehydration  Very dry mouth.  Sunken eyes.  Skin does not bounce back quickly when lightly pinched and released.  Dark urine and decreased urine production.  Decreased tear production.  Headache. Severe dehydration  Very dry mouth.  Extreme thirst.  Rapid, weak pulse (more than 100 beats per minute at rest).  Cold hands and feet.  Not able to sweat in spite of heat and temperature.  Rapid breathing.  Blue lips.  Confusion and lethargy.  Difficulty being awakened.  Minimal urine production.  No tears. DIAGNOSIS  Your caregiver will diagnose dehydration based on your symptoms and your exam. Blood and urine tests will help confirm the diagnosis. The diagnostic evaluation should also identify the cause of dehydration. TREATMENT  Treatment of mild or moderate dehydration can often be done at home by increasing the amount of fluids that you drink. It is best to drink small amounts of fluid more often. Drinking too much at one time can make vomiting worse. Refer to the home care instructions below. Severe dehydration needs to be treated at the hospital where you will probably be given intravenous (IV) fluids that contain water and electrolytes. HOME CARE INSTRUCTIONS   Ask your caregiver about specific rehydration instructions.  Drink enough fluids to keep your urine clear or pale yellow.  Drink small amounts frequently if you have nausea and vomiting.  Eat as you normally do.  Avoid:  Foods or drinks high in sugar.  Carbonated  drinks.  Juice.  Extremely hot or cold fluids.  Drinks with caffeine.  Fatty, greasy foods.  Alcohol.  Tobacco.  Overeating.  Gelatin desserts.  Wash your hands well to avoid spreading bacteria and viruses.  Only take over-the-counter or prescription medicines for pain, discomfort, or fever as directed by your caregiver.  Ask your caregiver if you should continue all prescribed and over-the-counter medicines.  Keep all follow-up appointments with your caregiver. SEEK MEDICAL CARE IF:  You have abdominal pain and it increases or stays in one area (localizes).  You have a rash, stiff neck, or severe headache.  You are irritable, sleepy, or difficult to awaken.  You are weak, dizzy, or extremely thirsty. SEEK IMMEDIATE MEDICAL CARE IF:   You are unable to keep fluids down or you get worse despite treatment.  You have frequent episodes of vomiting or diarrhea.  You have blood or green matter (bile) in your vomit.  You have blood in your stool or your stool looks black and tarry.  You have not urinated in 6 to 8 hours, or you have only urinated a small amount of very dark urine.  You have a fever.  You faint. MAKE SURE YOU:   Understand these instructions.  Will watch your condition.  Will get help right away if you are not doing well or get worse. Document Released: 05/10/2005 Document Revised: 08/02/2011 Document Reviewed: 12/28/2010 Exit[employer] Patient Information 2015 [employer]. This information is not intended to replace advice given to you by your health care provider. Make sure you discuss any questions you have with your health care   provider.  

## 2014-08-26 NOTE — Telephone Encounter (Signed)
Appointments made and avs printed for patient,note to dr Marko Plume that patient will need to go to [facility] for ivf 4/5

## 2014-08-27 ENCOUNTER — Other Ambulatory Visit (HOSPITAL_BASED_OUTPATIENT_CLINIC_OR_DEPARTMENT_OTHER): Payer: BLUE CROSS/BLUE SHIELD

## 2014-08-27 ENCOUNTER — Ambulatory Visit (HOSPITAL_BASED_OUTPATIENT_CLINIC_OR_DEPARTMENT_OTHER): Payer: BLUE CROSS/BLUE SHIELD

## 2014-08-27 ENCOUNTER — Ambulatory Visit: Payer: BLUE CROSS/BLUE SHIELD

## 2014-08-27 VITALS — BP 117/68 | HR 99 | Temp 98.6°F | Resp 16

## 2014-08-27 DIAGNOSIS — C801 Malignant (primary) neoplasm, unspecified: Secondary | ICD-10-CM | POA: Diagnosis not present

## 2014-08-27 DIAGNOSIS — R300 Dysuria: Secondary | ICD-10-CM

## 2014-08-27 DIAGNOSIS — C569 Malignant neoplasm of unspecified ovary: Secondary | ICD-10-CM

## 2014-08-27 LAB — CBC WITH DIFFERENTIAL/PLATELET
BASO%: 0.6 % (ref 0.0–2.0)
Basophils Absolute: 0 10*3/uL (ref 0.0–0.1)
EOS ABS: 0 10*3/uL (ref 0.0–0.5)
EOS%: 0.7 % (ref 0.0–7.0)
HCT: 31 % — ABNORMAL LOW (ref 34.8–46.6)
HGB: 10.1 g/dL — ABNORMAL LOW (ref 11.6–15.9)
LYMPH%: 10.1 % — AB (ref 14.0–49.7)
MCH: 30.4 pg (ref 25.1–34.0)
MCHC: 32.7 g/dL (ref 31.5–36.0)
MCV: 93.2 fL (ref 79.5–101.0)
MONO#: 1.2 10*3/uL — ABNORMAL HIGH (ref 0.1–0.9)
MONO%: 25.5 % — ABNORMAL HIGH (ref 0.0–14.0)
NEUT%: 63.1 % (ref 38.4–76.8)
NEUTROS ABS: 3 10*3/uL (ref 1.5–6.5)
Platelets: 128 10*3/uL — ABNORMAL LOW (ref 145–400)
RBC: 3.32 10*6/uL — ABNORMAL LOW (ref 3.70–5.45)
RDW: 14.6 % — AB (ref 11.2–14.5)
WBC: 4.8 10*3/uL (ref 3.9–10.3)
lymph#: 0.5 10*3/uL — ABNORMAL LOW (ref 0.9–3.3)

## 2014-08-27 MED ORDER — SODIUM CHLORIDE 0.9 % IJ SOLN
10.0000 mL | INTRAMUSCULAR | Status: DC | PRN
  Administered 2014-08-27: 10 mL via INTRAVENOUS
  Filled 2014-08-27: qty 10

## 2014-08-27 MED ORDER — SODIUM CHLORIDE 0.9 % IV SOLN
1000.0000 mL | INTRAVENOUS | Status: DC
  Administered 2014-08-27: 11:00:00 via INTRAVENOUS

## 2014-08-27 MED ORDER — HEPARIN SOD (PORK) LOCK FLUSH 100 UNIT/ML IV SOLN
500.0000 [IU] | Freq: Once | INTRAVENOUS | Status: AC
  Administered 2014-08-27: 500 [IU] via INTRAVENOUS
  Filled 2014-08-27: qty 5

## 2014-08-27 NOTE — Progress Notes (Signed)
Granix injection given by desk nurse

## 2014-08-27 NOTE — Progress Notes (Signed)
S/w Dr Marko Plume, hold granix, continue with IVF today, cancel IVF tomorrow. Keep appt with [facility] on Thursday. 1130 pt requested IVF be stopped after 500 cc. She had been to bathroom 3 times in that hour urinating. It is her vulvar tissues that are tender from wiping so frequently.

## 2014-08-27 NOTE — Patient Instructions (Signed)

## 2014-08-28 ENCOUNTER — Ambulatory Visit: Payer: BLUE CROSS/BLUE SHIELD

## 2014-08-28 LAB — URINE CULTURE

## 2014-08-29 ENCOUNTER — Telehealth: Payer: Self-pay | Admitting: *Deleted

## 2014-08-29 ENCOUNTER — Encounter: Payer: Self-pay | Admitting: Oncology

## 2014-08-29 ENCOUNTER — Other Ambulatory Visit (HOSPITAL_BASED_OUTPATIENT_CLINIC_OR_DEPARTMENT_OTHER): Payer: BLUE CROSS/BLUE SHIELD

## 2014-08-29 ENCOUNTER — Ambulatory Visit (HOSPITAL_BASED_OUTPATIENT_CLINIC_OR_DEPARTMENT_OTHER): Payer: BLUE CROSS/BLUE SHIELD | Admitting: Oncology

## 2014-08-29 ENCOUNTER — Telehealth: Payer: Self-pay | Admitting: Oncology

## 2014-08-29 VITALS — BP 140/73 | HR 87 | Temp 98.2°F | Resp 18 | Ht 61.0 in | Wt 135.5 lb

## 2014-08-29 DIAGNOSIS — C801 Malignant (primary) neoplasm, unspecified: Secondary | ICD-10-CM | POA: Diagnosis not present

## 2014-08-29 DIAGNOSIS — D6481 Anemia due to antineoplastic chemotherapy: Secondary | ICD-10-CM

## 2014-08-29 DIAGNOSIS — R300 Dysuria: Secondary | ICD-10-CM

## 2014-08-29 DIAGNOSIS — D701 Agranulocytosis secondary to cancer chemotherapy: Secondary | ICD-10-CM

## 2014-08-29 DIAGNOSIS — G893 Neoplasm related pain (acute) (chronic): Secondary | ICD-10-CM | POA: Diagnosis not present

## 2014-08-29 DIAGNOSIS — T8384XD Pain from genitourinary prosthetic devices, implants and grafts, subsequent encounter: Secondary | ICD-10-CM

## 2014-08-29 DIAGNOSIS — T451X5A Adverse effect of antineoplastic and immunosuppressive drugs, initial encounter: Secondary | ICD-10-CM

## 2014-08-29 DIAGNOSIS — C569 Malignant neoplasm of unspecified ovary: Secondary | ICD-10-CM

## 2014-08-29 DIAGNOSIS — D649 Anemia, unspecified: Secondary | ICD-10-CM

## 2014-08-29 DIAGNOSIS — G620 Drug-induced polyneuropathy: Secondary | ICD-10-CM | POA: Diagnosis not present

## 2014-08-29 DIAGNOSIS — Z95828 Presence of other vascular implants and grafts: Secondary | ICD-10-CM

## 2014-08-29 LAB — CBC WITH DIFFERENTIAL/PLATELET
BASO%: 0.6 % (ref 0.0–2.0)
Basophils Absolute: 0 10*3/uL (ref 0.0–0.1)
EOS%: 0.4 % (ref 0.0–7.0)
Eosinophils Absolute: 0 10*3/uL (ref 0.0–0.5)
HCT: 29.5 % — ABNORMAL LOW (ref 34.8–46.6)
HGB: 9.8 g/dL — ABNORMAL LOW (ref 11.6–15.9)
LYMPH%: 12.7 % — ABNORMAL LOW (ref 14.0–49.7)
MCH: 31.4 pg (ref 25.1–34.0)
MCHC: 33.2 g/dL (ref 31.5–36.0)
MCV: 94.6 fL (ref 79.5–101.0)
MONO#: 0.9 10*3/uL (ref 0.1–0.9)
MONO%: 16.7 % — AB (ref 0.0–14.0)
NEUT#: 3.6 10*3/uL (ref 1.5–6.5)
NEUT%: 69.6 % (ref 38.4–76.8)
Platelets: 157 10*3/uL (ref 145–400)
RBC: 3.12 10*6/uL — AB (ref 3.70–5.45)
RDW: 14.2 % (ref 11.2–14.5)
WBC: 5.1 10*3/uL (ref 3.9–10.3)
lymph#: 0.7 10*3/uL — ABNORMAL LOW (ref 0.9–3.3)

## 2014-08-29 LAB — BASIC METABOLIC PANEL (CC13)
Anion Gap: 10 mEq/L (ref 3–11)
BUN: 8.2 mg/dL (ref 7.0–26.0)
CO2: 27 mEq/L (ref 22–29)
Calcium: 9.2 mg/dL (ref 8.4–10.4)
Chloride: 102 mEq/L (ref 98–109)
Creatinine: 0.8 mg/dL (ref 0.6–1.1)
EGFR: 77 mL/min/{1.73_m2} — AB (ref >90)
GLUCOSE: 114 mg/dL (ref 70–140)
Potassium: 3.7 mEq/L (ref 3.5–5.1)
Sodium: 140 mEq/L (ref 136–145)

## 2014-08-29 MED ORDER — MORPHINE SULFATE 15 MG PO TABS: 7.5000 mg | ORAL_TABLET | ORAL | Status: DC | PRN

## 2014-08-29 NOTE — Progress Notes (Signed)
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 Problems from earlier this week with neutropenia, dehydration and uncontrolled pain have all improved.  Patient had #4 CDDP gemazar on 08-14-14; she had not needed gCSF with this regimen previously. ANC was 0.8 on 08-26-14, given granix 300 on 4-4, with  ANC out of neutropenic range by 08-27-14. She did not have fever. She did not ache with that granix. Urine culture 08-26-14 resulted with > 100,000 multiple species, however symptoms better on cipro begun 4-4 so will complete this course. She still has discomfort just at urethra on voiding.  Perineal pain otherwise much better with MSIR 15 mg 1/2 - 1 prn, has used total 5 tablets since 08-26-14. She has stopped advil. She had IVF on 4-4 and 08-27-14 and is pushing po fluids, at least 40 oz daily now. PO intake some better, 5 small meals daily.  No bleeding. Ileostomy output as usual.    She has PAC Flu vaccine done Right JJ stent in  [place] Patient presented with RLE swelling negative for DVT July 2014, and right hydronephrosis. 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 She had 3 additional cycles, with progressive decrease in CA 125 from 5626 in 12-2012 to 596 by Jan 2015. CT 06-14-13 had ~ stable pelvic mass and adenopathy. Dr Josephina Shih recommended another 3 cycles chemotherapy, regimen changed to q 3 week taxol carboplatin on 06-27-13, which unfortunately caused worsening of peripheral neuropathy and severe taxol aches.Taxotere was  substituted for taxol due to neuropathy in feet for cycles 8 and 9, completed 08-08-13. CA 125 improved to low of 494 in Feb 2015, then 732 just prior to cycle 9. CT AP 08-22-13 had minimal improvement, still not adequate to consider debulking surgery. She received doxil for 6 cycles, from 09-19-13 thru 02-06-14. CT AP 02-27-14 had some early increase in central pelvic mass, with treatment changed to gemzar beginning 03-29-14. CA 125 baseline for gemzar was 1932 on 03-27-14. Cycle 4 gemzar was given 05-22-14, with CA 125 2680. CDDP was added to gemzar beginning 06-05-14.     Review of systems as above, also: No respiratory symptoms, no vomiting. Mild RLE swelling again after rehydration. She is up multiple times to void thru night, so will stop po fluids several hours prior to bedtime. Remainder of 10 point Review of Systems negative.  Objective:  Vital signs in last 24 hours:  BP 140/73 mmHg  Pulse 87  Temp(Src) 98.2 F (36.8 C) (Oral)  Resp 18  Ht 5' 1"  (1.549 m)  Wt 135 lb 8 oz (61.462 kg)  BMI 25.62 kg/m2  SpO2 100% Weight up 4 lbs from earlier this week. Alert, oriented and appropriate. More easily ambulatory, looks more comfortable. Pale, not icteric. Respirations not labored with activity in exam room. Partial alopecia  HEENT:PERRL, sclerae not icteric. Oral mucosa moist without lesions, posterior pharynx clear.  Neck supple. No JVD.  Lymphatics:no cervical,supraclavicular adenopathy Resp: clear to auscultation bilaterally and normal percussion bilaterally Cardio: regular rate and rhythm. No gallop. GI: soft, nontender, not distended, no mass or organomegaly. Normally active bowel sounds. Surgical incisions  not remarkable. Soft brown stool in ileostomy Musculoskeletal/ Extremities: RLE 1+ swelling, otherwise without pitting edema, cords, tenderness Neuro: no change peripheral neuropathy. Otherwise nonfocal. PSYCH appropriate mood and affect Skin without rash, ecchymosis,  petechiae Portacath-without erythema or tenderness  Lab Results:  Results for orders placed or performed in visit on 08/29/14  CBC with Differential  Result Value Ref Range   WBC 5.1 3.9 - 10.3 10e3/uL   NEUT# 3.6 1.5 - 6.5 10e3/uL   HGB 9.8 (L) 11.6 - 15.9 g/dL   HCT 29.5 (L) 34.8 - 46.6 %   Platelets 157 145 - 400 10e3/uL   MCV 94.6 79.5 - 101.0 fL   MCH 31.4 25.1 - 34.0 pg   MCHC 33.2 31.5 - 36.0 g/dL   RBC 3.12 (L) 3.70 - 5.45 10e6/uL   RDW 14.2 11.2 - 14.5 %   lymph# 0.7 (L) 0.9 - 3.3 10e3/uL   MONO# 0.9 0.1 - 0.9 10e3/uL   Eosinophils Absolute 0.0 0.0 - 0.5 10e3/uL   Basophils Absolute 0.0 0.0 - 0.1 10e3/uL   NEUT% 69.6 38.4 - 76.8 %   LYMPH% 12.7 (L) 14.0 - 49.7 %   MONO% 16.7 (H) 0.0 - 14.0 %   EOS% 0.4 0.0 - 7.0 %   BASO% 0.6 0.0 - 2.0 %  Basic metabolic panel (Bmet) - [facility]  Result Value Ref Range   Sodium 140 136 - 145 mEq/L   Potassium 3.7 3.5 - 5.1 mEq/L   Chloride 102 98 - 109 mEq/L   CO2 27 22 - 29 mEq/L   Glucose 114 70 - 140 mg/dl   BUN 8.2 7.0 - 26.0 mg/dL   Creatinine 0.8 0.6 - 1.1 mg/dL   Calcium 9.2 8.4 - 10.4 mg/dL   Anion Gap 10 3 - 11 mEq/L   EGFR 77 (L) >90 ml/min/1.73 m2    CA 125 from 08-14-14 down to 1234, from 2401 on 07-01-14 and 2680 on 05-23-15.    Studies/Results:  No results found.  Medications: I have reviewed the patient's current medications. She will complete last of Cipro as she is clearly improved from interventions over last few days. Prescription given for MSIR 15 mg 1/2 - 1 tablet every 4 hrs as needed for pain, as this will be good to have available. Continue po iron.   DISCUSSION: we are encouraged that marker is down since addition of CDDP to the gemzar. Will try to continue this every 3 weeks, due on 4-13 since treatment held this week. Will give granix x2 after each chemo.   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 CA 125  lower since addition of CDDP to gemzar, so will try to continue every 3 weeks with granix. 2.Prn MSIR helpful for stent/ bladder/ cancer pain.  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 Will check iron studies with next labs 10.chemo neutropenia resolved. Granix as above  All questions answered. Patient is in agreement with plans as above. Time spent 25 min including >50% counseling and coordination of care.    Eann Cleland P, MD   08/29/2014, 1:36 PM

## 2014-08-29 NOTE — Telephone Encounter (Signed)
Per staff message and POF I have scheduled appts. Advised scheduler of appts. JMW  

## 2014-08-29 NOTE — Telephone Encounter (Signed)
per pof to sch pt appt-sent MW email to sch pt appt-gave pt copy of sch

## 2014-08-31 ENCOUNTER — Other Ambulatory Visit: Payer: Self-pay | Admitting: Oncology

## 2014-08-31 DIAGNOSIS — Z95828 Presence of other vascular implants and grafts: Secondary | ICD-10-CM | POA: Insufficient documentation

## 2014-08-31 DIAGNOSIS — T8384XA Pain from genitourinary prosthetic devices, implants and grafts, initial encounter: Secondary | ICD-10-CM | POA: Insufficient documentation

## 2014-09-04 ENCOUNTER — Ambulatory Visit (HOSPITAL_BASED_OUTPATIENT_CLINIC_OR_DEPARTMENT_OTHER): Payer: BLUE CROSS/BLUE SHIELD

## 2014-09-04 ENCOUNTER — Other Ambulatory Visit (HOSPITAL_BASED_OUTPATIENT_CLINIC_OR_DEPARTMENT_OTHER): Payer: BLUE CROSS/BLUE SHIELD

## 2014-09-04 VITALS — BP 130/65 | HR 72 | Temp 98.6°F | Resp 17

## 2014-09-04 DIAGNOSIS — C801 Malignant (primary) neoplasm, unspecified: Secondary | ICD-10-CM

## 2014-09-04 DIAGNOSIS — Z5111 Encounter for antineoplastic chemotherapy: Secondary | ICD-10-CM | POA: Diagnosis not present

## 2014-09-04 DIAGNOSIS — D6481 Anemia due to antineoplastic chemotherapy: Secondary | ICD-10-CM

## 2014-09-04 DIAGNOSIS — C569 Malignant neoplasm of unspecified ovary: Secondary | ICD-10-CM

## 2014-09-04 DIAGNOSIS — T451X5A Adverse effect of antineoplastic and immunosuppressive drugs, initial encounter: Secondary | ICD-10-CM

## 2014-09-04 LAB — CBC WITH DIFFERENTIAL/PLATELET
BASO%: 0.8 % (ref 0.0–2.0)
Basophils Absolute: 0 10*3/uL (ref 0.0–0.1)
EOS%: 0.9 % (ref 0.0–7.0)
Eosinophils Absolute: 0 10*3/uL (ref 0.0–0.5)
HCT: 30.5 % — ABNORMAL LOW (ref 34.8–46.6)
HGB: 9.9 g/dL — ABNORMAL LOW (ref 11.6–15.9)
LYMPH%: 11.5 % — ABNORMAL LOW (ref 14.0–49.7)
MCH: 31 pg (ref 25.1–34.0)
MCHC: 32.5 g/dL (ref 31.5–36.0)
MCV: 95.4 fL (ref 79.5–101.0)
MONO#: 0.5 10*3/uL (ref 0.1–0.9)
MONO%: 10.2 % (ref 0.0–14.0)
NEUT%: 76.6 % (ref 38.4–76.8)
NEUTROS ABS: 3.8 10*3/uL (ref 1.5–6.5)
PLATELETS: 365 10*3/uL (ref 145–400)
RBC: 3.2 10*6/uL — AB (ref 3.70–5.45)
RDW: 15.5 % — ABNORMAL HIGH (ref 11.2–14.5)
WBC: 4.9 10*3/uL (ref 3.9–10.3)
lymph#: 0.6 10*3/uL — ABNORMAL LOW (ref 0.9–3.3)

## 2014-09-04 LAB — IRON AND TIBC CHCC
%SAT: 24 % (ref 21–57)
Iron: 45 ug/dL (ref 41–142)
TIBC: 191 ug/dL — ABNORMAL LOW (ref 236–444)
UIBC: 146 ug/dL (ref 120–384)

## 2014-09-04 LAB — COMPREHENSIVE METABOLIC PANEL (CC13)
ALBUMIN: 2.9 g/dL — AB (ref 3.5–5.0)
ALK PHOS: 97 U/L (ref 40–150)
ALT: 9 U/L (ref 0–55)
AST: 17 U/L (ref 5–34)
Anion Gap: 8 mEq/L (ref 3–11)
BUN: 9.6 mg/dL (ref 7.0–26.0)
CO2: 29 mEq/L (ref 22–29)
Calcium: 9.7 mg/dL (ref 8.4–10.4)
Chloride: 103 mEq/L (ref 98–109)
Creatinine: 0.8 mg/dL (ref 0.6–1.1)
EGFR: 83 mL/min/{1.73_m2} — ABNORMAL LOW (ref >90)
Glucose: 106 mg/dl (ref 70–140)
POTASSIUM: 3.7 meq/L (ref 3.5–5.1)
SODIUM: 140 meq/L (ref 136–145)
TOTAL PROTEIN: 6.1 g/dL — AB (ref 6.4–8.3)
Total Bilirubin: <0.2 mg/dL (ref 0.20–1.20)

## 2014-09-04 LAB — MAGNESIUM (CC13): MAGNESIUM: 1.5 mg/dL (ref 1.5–2.5)

## 2014-09-04 MED ORDER — POTASSIUM CHLORIDE 2 MEQ/ML IV SOLN
Freq: Once | INTRAVENOUS | Status: AC
  Administered 2014-09-04: 09:00:00 via INTRAVENOUS
  Filled 2014-09-04: qty 10

## 2014-09-04 MED ORDER — SODIUM CHLORIDE 0.9 % IJ SOLN
10.0000 mL | INTRAMUSCULAR | Status: DC | PRN
  Administered 2014-09-04: 10 mL
  Filled 2014-09-04: qty 10

## 2014-09-04 MED ORDER — SODIUM CHLORIDE 0.9 % IV SOLN
600.0000 mg/m2 | Freq: Once | INTRAVENOUS | Status: AC
  Administered 2014-09-04: 988 mg via INTRAVENOUS
  Filled 2014-09-04: qty 25.98

## 2014-09-04 MED ORDER — HEPARIN SOD (PORK) LOCK FLUSH 100 UNIT/ML IV SOLN
500.0000 [IU] | Freq: Once | INTRAVENOUS | Status: AC | PRN
  Administered 2014-09-04: 500 [IU]
  Filled 2014-09-04: qty 5

## 2014-09-04 MED ORDER — SODIUM CHLORIDE 0.9 % IV SOLN
30.5000 mg/m2 | Freq: Once | INTRAVENOUS | Status: AC
  Administered 2014-09-04: 50 mg via INTRAVENOUS
  Filled 2014-09-04: qty 50

## 2014-09-04 MED ORDER — ONDANSETRON HCL 40 MG/20ML IJ SOLN
Freq: Once | INTRAMUSCULAR | Status: AC
  Administered 2014-09-04: 10:00:00 via INTRAVENOUS
  Filled 2014-09-04: qty 4

## 2014-09-04 MED ORDER — SODIUM CHLORIDE 0.9 % IV SOLN
Freq: Once | INTRAVENOUS | Status: AC
  Administered 2014-09-04: 09:00:00 via INTRAVENOUS

## 2014-09-04 MED ORDER — SODIUM CHLORIDE 0.9 % IV SOLN
Freq: Once | INTRAVENOUS | Status: AC
  Administered 2014-09-04: 10:00:00 via INTRAVENOUS
  Filled 2014-09-04: qty 5

## 2014-09-04 NOTE — Patient Instructions (Signed)
[facility] Discharge Instructions for Patients Receiving Chemotherapy  Today you received the following chemotherapy agents: Gemzar, Cisplatin  To help prevent nausea and vomiting after your treatment, we encourage you to take your nausea medication as prescribed by your physician.   If you develop nausea and vomiting that is not controlled by your nausea medication, call the clinic.   BELOW ARE SYMPTOMS THAT SHOULD BE REPORTED IMMEDIATELY:  *FEVER GREATER THAN 100.5 F  *CHILLS WITH OR WITHOUT FEVER  NAUSEA AND VOMITING THAT IS NOT CONTROLLED WITH YOUR NAUSEA MEDICATION  *UNUSUAL SHORTNESS OF BREATH  *UNUSUAL BRUISING OR BLEEDING  TENDERNESS IN MOUTH AND THROAT WITH OR WITHOUT PRESENCE OF ULCERS  *URINARY PROBLEMS  *BOWEL PROBLEMS  UNUSUAL RASH Items with * indicate a potential emergency and should be followed up as soon as possible.  Feel free to call the clinic you have any questions or concerns. The clinic phone number is [phone].  Please show the [facility] at check-in to the Emergency Department and triage nurse.

## 2014-09-04 NOTE — Progress Notes (Signed)
Urine output: 1,029mL pre-cisplatin; 1,710mL post-cisplatin; frequent urination due to stent per pt.

## 2014-09-05 ENCOUNTER — Ambulatory Visit: Payer: BLUE CROSS/BLUE SHIELD

## 2014-09-06 ENCOUNTER — Ambulatory Visit (HOSPITAL_BASED_OUTPATIENT_CLINIC_OR_DEPARTMENT_OTHER): Payer: BLUE CROSS/BLUE SHIELD

## 2014-09-06 VITALS — BP 122/65 | HR 81 | Temp 98.2°F

## 2014-09-06 DIAGNOSIS — C569 Malignant neoplasm of unspecified ovary: Secondary | ICD-10-CM

## 2014-09-06 DIAGNOSIS — Z5189 Encounter for other specified aftercare: Secondary | ICD-10-CM

## 2014-09-06 DIAGNOSIS — C801 Malignant (primary) neoplasm, unspecified: Secondary | ICD-10-CM

## 2014-09-06 MED ORDER — TBO-FILGRASTIM 300 MCG/0.5ML ~~LOC~~ SOSY
300.0000 ug | PREFILLED_SYRINGE | Freq: Once | SUBCUTANEOUS | Status: AC
Start: 2014-09-06 — End: 2014-09-06
  Administered 2014-09-06: 300 ug via SUBCUTANEOUS
  Filled 2014-09-06: qty 0.5

## 2014-09-07 ENCOUNTER — Ambulatory Visit (HOSPITAL_BASED_OUTPATIENT_CLINIC_OR_DEPARTMENT_OTHER): Payer: BLUE CROSS/BLUE SHIELD

## 2014-09-07 VITALS — BP 113/63 | HR 90 | Temp 98.8°F | Resp 16

## 2014-09-07 DIAGNOSIS — C801 Malignant (primary) neoplasm, unspecified: Secondary | ICD-10-CM | POA: Diagnosis not present

## 2014-09-07 DIAGNOSIS — Z5189 Encounter for other specified aftercare: Secondary | ICD-10-CM

## 2014-09-07 DIAGNOSIS — C569 Malignant neoplasm of unspecified ovary: Secondary | ICD-10-CM

## 2014-09-07 MED ORDER — TBO-FILGRASTIM 300 MCG/0.5ML ~~LOC~~ SOSY
300.0000 ug | PREFILLED_SYRINGE | Freq: Once | SUBCUTANEOUS | Status: AC
  Administered 2014-09-07: 300 ug via SUBCUTANEOUS

## 2014-09-07 NOTE — Patient Instructions (Signed)

## 2014-09-08 ENCOUNTER — Other Ambulatory Visit: Payer: Self-pay | Admitting: Oncology

## 2014-09-09 ENCOUNTER — Ambulatory Visit (HOSPITAL_BASED_OUTPATIENT_CLINIC_OR_DEPARTMENT_OTHER): Payer: BLUE CROSS/BLUE SHIELD | Admitting: Oncology

## 2014-09-09 ENCOUNTER — Telehealth: Payer: Self-pay | Admitting: Oncology

## 2014-09-09 ENCOUNTER — Encounter: Payer: Self-pay | Admitting: Oncology

## 2014-09-09 ENCOUNTER — Other Ambulatory Visit (HOSPITAL_BASED_OUTPATIENT_CLINIC_OR_DEPARTMENT_OTHER): Payer: BLUE CROSS/BLUE SHIELD

## 2014-09-09 VITALS — BP 131/71 | HR 91 | Temp 98.1°F | Resp 18 | Ht 61.0 in | Wt 132.4 lb

## 2014-09-09 DIAGNOSIS — C569 Malignant neoplasm of unspecified ovary: Secondary | ICD-10-CM

## 2014-09-09 DIAGNOSIS — C801 Malignant (primary) neoplasm, unspecified: Secondary | ICD-10-CM

## 2014-09-09 DIAGNOSIS — I890 Lymphedema, not elsewhere classified: Secondary | ICD-10-CM

## 2014-09-09 DIAGNOSIS — G893 Neoplasm related pain (acute) (chronic): Secondary | ICD-10-CM

## 2014-09-09 DIAGNOSIS — G620 Drug-induced polyneuropathy: Secondary | ICD-10-CM

## 2014-09-09 DIAGNOSIS — Z8719 Personal history of other diseases of the digestive system: Secondary | ICD-10-CM

## 2014-09-09 DIAGNOSIS — N1330 Unspecified hydronephrosis: Secondary | ICD-10-CM

## 2014-09-09 DIAGNOSIS — D6481 Anemia due to antineoplastic chemotherapy: Secondary | ICD-10-CM

## 2014-09-09 DIAGNOSIS — Z95828 Presence of other vascular implants and grafts: Secondary | ICD-10-CM

## 2014-09-09 DIAGNOSIS — D701 Agranulocytosis secondary to cancer chemotherapy: Secondary | ICD-10-CM

## 2014-09-09 DIAGNOSIS — T451X5A Adverse effect of antineoplastic and immunosuppressive drugs, initial encounter: Secondary | ICD-10-CM

## 2014-09-09 DIAGNOSIS — C772 Secondary and unspecified malignant neoplasm of intra-abdominal lymph nodes: Secondary | ICD-10-CM

## 2014-09-09 LAB — COMPREHENSIVE METABOLIC PANEL (CC13)
ALT: 11 U/L (ref 0–55)
ANION GAP: 11 meq/L (ref 3–11)
AST: 14 U/L (ref 5–34)
Albumin: 3.2 g/dL — ABNORMAL LOW (ref 3.5–5.0)
Alkaline Phosphatase: 106 U/L (ref 40–150)
BUN: 12.3 mg/dL (ref 7.0–26.0)
CHLORIDE: 105 meq/L (ref 98–109)
CO2: 25 meq/L (ref 22–29)
CREATININE: 0.7 mg/dL (ref 0.6–1.1)
Calcium: 9.2 mg/dL (ref 8.4–10.4)
EGFR: 89 mL/min/{1.73_m2} — ABNORMAL LOW (ref >90)
Glucose: 110 mg/dl (ref 70–140)
Potassium: 4.2 mEq/L (ref 3.5–5.1)
Sodium: 141 mEq/L (ref 136–145)
Total Bilirubin: 0.27 mg/dL (ref 0.20–1.20)
Total Protein: 6.5 g/dL (ref 6.4–8.3)

## 2014-09-09 LAB — CBC WITH DIFFERENTIAL/PLATELET
BASO%: 0.4 % (ref 0.0–2.0)
BASOS ABS: 0 10*3/uL (ref 0.0–0.1)
EOS ABS: 0 10*3/uL (ref 0.0–0.5)
EOS%: 0.4 % (ref 0.0–7.0)
HEMATOCRIT: 31.1 % — AB (ref 34.8–46.6)
HEMOGLOBIN: 10.2 g/dL — AB (ref 11.6–15.9)
LYMPH%: 5.7 % — ABNORMAL LOW (ref 14.0–49.7)
MCH: 31.1 pg (ref 25.1–34.0)
MCHC: 32.6 g/dL (ref 31.5–36.0)
MCV: 95.5 fL (ref 79.5–101.0)
MONO#: 0.1 10*3/uL (ref 0.1–0.9)
MONO%: 0.9 % (ref 0.0–14.0)
NEUT#: 8.9 10*3/uL — ABNORMAL HIGH (ref 1.5–6.5)
NEUT%: 92.6 % — AB (ref 38.4–76.8)
Platelets: 271 10*3/uL (ref 145–400)
RBC: 3.26 10*6/uL — ABNORMAL LOW (ref 3.70–5.45)
RDW: 16 % — ABNORMAL HIGH (ref 11.2–14.5)
WBC: 9.6 10*3/uL (ref 3.9–10.3)
lymph#: 0.5 10*3/uL — ABNORMAL LOW (ref 0.9–3.3)

## 2014-09-09 LAB — MAGNESIUM (CC13): Magnesium: 1.7 mg/dl (ref 1.5–2.5)

## 2014-09-09 MED ORDER — HYDROCODONE-ACETAMINOPHEN 5-325 MG PO TABS: ORAL_TABLET | ORAL | Status: DC

## 2014-09-09 MED ORDER — OXYBUTYNIN CHLORIDE 5 MG PO TABS: 5.0000 mg | ORAL_TABLET | Freq: Three times a day (TID) | ORAL | Status: DC

## 2014-09-09 NOTE — Telephone Encounter (Signed)
Appointments made and avs printed for patient °

## 2014-09-09 NOTE — Progress Notes (Signed)
OFFICE PROGRESS NOTE   September 09, 2014   Physicians:ClarkePearson, Quillian Quince; Evalee Mutton, Marlene Bast, Floy Sabina, Annie Main  INTERVAL HISTORY:  Patient is seen, together with sister, in continuing attention to chemotherapy continuing for advanced gyn carcinoma. Present treatment is gemzar CDDP every 3 weeks, cycle 5 given on 09-04-14 with granix x 2 doses.   Patient was fatigued shortly after chemo, but felt a little more energetic after granix, and did not have aches. She has not needed any antiemetics, is eating and drinking adequately. She has not needed MSIR in ~ 2 weeks; last VIcodin was 1/2 tablet 3 days ago. Peripheral neuropathy is stable. She is doing some light housework.    She has PAC Flu vaccine done Right JJ stent in  [place] Patient presented with RLE swelling negative for DVT July 2014, and right hydronephrosis. 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 She had 3 additional cycles, with progressive decrease in CA 125 from 5626 in 12-2012 to 596 by Jan 2015. CT 06-14-13 had ~ stable pelvic mass and adenopathy. Dr Josephina Shih recommended another 3 cycles chemotherapy, regimen changed to q 3 week taxol carboplatin on 06-27-13, which unfortunately caused worsening of peripheral neuropathy and severe taxol aches.Taxotere was substituted for taxol due to neuropathy in feet for cycles 8 and 9, completed 08-08-13. CA 125 improved to low of 494 in Feb 2015, then 732 just prior to cycle 9. CT AP 08-22-13 had minimal improvement, still not adequate to consider debulking surgery. She received doxil for 6 cycles, from 09-19-13 thru 02-06-14. CT AP 02-27-14 had some early increase in central pelvic mass, with treatment changed to gemzar beginning 03-29-14. CA 125 baseline for gemzar was 1932 on  03-27-14. Cycle 4 gemzar was given 05-22-14, with CA 125 2680. CDDP was added to gemzar beginning 06-05-14.    Review of systems as above, also: No fever, no gross hematuria, no other bleeding. Ostomy output at baseline. No SOB with activity in exam room. No abdominal pain Remainder of 10 point Review of Systems negative.  Objective:  Vital signs in last 24 hours:  BP 131/71 mmHg  Pulse 91  Temp(Src) 98.1 F (36.7 C) (Oral)  Resp 18  Ht 5' 1" (1.549 m)  Wt 132 lb 6.4 oz (60.056 kg)  BMI 25.03 kg/m2 Weight is down 3 lbs Alert, oriented and appropriate. Ambulatory without difficulty.  Alopecia  HEENT:PERRL, sclerae not icteric. Oral mucosa moist without lesions, posterior pharynx clear.  Neck supple. No JVD.  Lymphatics:no cervical,supraclavicular adenopathy Resp: clear to auscultation bilaterally and normal percussion bilaterally Cardio: regular rate and rhythm. No gallop. GI: soft, nontender, not distended, no mass or organomegaly. Normally active bowel sounds. Ostomy not remarkable Musculoskeletal/ Extremities: RLE with stable 2+ swelling, otherwise without pitting edema, cords, tenderness Neuro: no change peripheral neuropathy in feet. Otherwise nonfocal.PSYCH appropriate mood and affect Skin without rash, ecchymosis, petechiae  Portacath-without erythema or tenderness  Lab Results:  Results for orders placed or performed in visit on 09/09/14  CBC with Differential  Result Value Ref Range   WBC 9.6 3.9 - 10.3 10e3/uL   NEUT# 8.9 (H) 1.5 - 6.5 10e3/uL   HGB 10.2 (L) 11.6 - 15.9 g/dL   HCT 31.1 (L) 34.8 - 46.6 %   Platelets 271 145 - 400 10e3/uL   MCV 95.5 79.5 - 101.0 fL   MCH 31.1  25.1 - 34.0 pg   MCHC 32.6 31.5 - 36.0 g/dL   RBC 3.26 (L) 3.70 - 5.45 10e6/uL   RDW 16.0 (H) 11.2 - 14.5 %   lymph# 0.5 (L) 0.9 - 3.3 10e3/uL   MONO# 0.1 0.1 - 0.9 10e3/uL   Eosinophils Absolute 0.0 0.0 - 0.5 10e3/uL   Basophils Absolute 0.0 0.0 - 0.1 10e3/uL   NEUT% 92.6 (H) 38.4 -  76.8 %   LYMPH% 5.7 (L) 14.0 - 49.7 %   MONO% 0.9 0.0 - 14.0 %   EOS% 0.4 0.0 - 7.0 %   BASO% 0.4 0.0 - 2.0 %  Comprehensive metabolic panel (Cmet) - [facility]  Result Value Ref Range   Sodium 141 136 - 145 mEq/L   Potassium 4.2 3.5 - 5.1 mEq/L   Chloride 105 98 - 109 mEq/L   CO2 25 22 - 29 mEq/L   Glucose 110 70 - 140 mg/dl   BUN 12.3 7.0 - 26.0 mg/dL   Creatinine 0.7 0.6 - 1.1 mg/dL   Total Bilirubin 0.27 0.20 - 1.20 mg/dL   Alkaline Phosphatase 106 40 - 150 U/L   AST 14 5 - 34 U/L   ALT 11 0 - 55 U/L   Total Protein 6.5 6.4 - 8.3 g/dL   Albumin 3.2 (L) 3.5 - 5.0 g/dL   Calcium 9.2 8.4 - 10.4 mg/dL   Anion Gap 11 3 - 11 mEq/L   EGFR 89 (L) >90 ml/min/1.73 m2  Magnesium  Result Value Ref Range   Magnesium 1.7 1.5 - 2.5 mg/dl   Will repeat CA 125 on 09-25-14  Studies/Results:  No results found.  Last CT AP 07-19-14  Medications: I have reviewed the patient's current medications.  DISCUSSION: Recent complications all improved and patient appears stable to continue treatment as planned.  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 CA 125 lower since addition of CDDP to gemzar, so will try to continue every 3 weeks with granix. She will have cycle 6 on 09-25-14 as long as ANC >=1.5 and plt >=100k, with granix x 2. 2.Prn MSIR helpful for stent/ bladder/ cancer pain recently, available if needed. 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 Iron low normal on 09-04-14, continue oral iron as tolerated 10.chemo neutropenia resolved. Granix as above  Chemo and granix orders entered. Patient knows to call prior to next scheduled  appointment if needed. Time spent 25 min including >50% counseling and coordination of care.    , P, MD   09/09/2014, 1:14 PM

## 2014-09-10 DIAGNOSIS — G620 Drug-induced polyneuropathy: Secondary | ICD-10-CM | POA: Insufficient documentation

## 2014-09-10 DIAGNOSIS — T451X5A Adverse effect of antineoplastic and immunosuppressive drugs, initial encounter: Secondary | ICD-10-CM

## 2014-09-10 DIAGNOSIS — C772 Secondary and unspecified malignant neoplasm of intra-abdominal lymph nodes: Secondary | ICD-10-CM | POA: Insufficient documentation

## 2014-09-10 DIAGNOSIS — Z8719 Personal history of other diseases of the digestive system: Secondary | ICD-10-CM | POA: Insufficient documentation

## 2014-09-10 DIAGNOSIS — D6481 Anemia due to antineoplastic chemotherapy: Secondary | ICD-10-CM | POA: Insufficient documentation

## 2014-09-13 ENCOUNTER — Encounter (HOSPITAL_COMMUNITY): Payer: Self-pay

## 2014-09-13 ENCOUNTER — Emergency Department (HOSPITAL_COMMUNITY)
Admission: EM | Admit: 2014-09-13 | Discharge: 2014-09-13 | Disposition: A | Discharge status: Home / Self Care | Payer: BLUE CROSS/BLUE SHIELD | Attending: Emergency Medicine | Admitting: Emergency Medicine

## 2014-09-13 DIAGNOSIS — Z9049 Acquired absence of other specified parts of digestive tract: Secondary | ICD-10-CM | POA: Diagnosis not present

## 2014-09-13 DIAGNOSIS — Z9889 Other specified postprocedural states: Secondary | ICD-10-CM | POA: Insufficient documentation

## 2014-09-13 DIAGNOSIS — Z85830 Personal history of malignant neoplasm of bone: Secondary | ICD-10-CM | POA: Diagnosis not present

## 2014-09-13 DIAGNOSIS — Z8543 Personal history of malignant neoplasm of ovary: Secondary | ICD-10-CM | POA: Diagnosis not present

## 2014-09-13 DIAGNOSIS — I10 Essential (primary) hypertension: Secondary | ICD-10-CM | POA: Diagnosis not present

## 2014-09-13 DIAGNOSIS — N3000 Acute cystitis without hematuria: Secondary | ICD-10-CM | POA: Diagnosis not present

## 2014-09-13 DIAGNOSIS — Z85858 Personal history of malignant neoplasm of other endocrine glands: Secondary | ICD-10-CM | POA: Insufficient documentation

## 2014-09-13 DIAGNOSIS — Z8719 Personal history of other diseases of the digestive system: Secondary | ICD-10-CM | POA: Diagnosis not present

## 2014-09-13 DIAGNOSIS — R3989 Other symptoms and signs involving the genitourinary system: Secondary | ICD-10-CM | POA: Diagnosis present

## 2014-09-13 DIAGNOSIS — Z9221 Personal history of antineoplastic chemotherapy: Secondary | ICD-10-CM | POA: Diagnosis not present

## 2014-09-13 DIAGNOSIS — Z8669 Personal history of other diseases of the nervous system and sense organs: Secondary | ICD-10-CM | POA: Diagnosis not present

## 2014-09-13 LAB — URINE MICROSCOPIC-ADD ON

## 2014-09-13 MED ORDER — SULFAMETHOXAZOLE-TRIMETHOPRIM 800-160 MG PO TABS: 1.0000 | ORAL_TABLET | Freq: Two times a day (BID) | ORAL | Status: AC

## 2014-09-13 MED ORDER — SULFAMETHOXAZOLE-TRIMETHOPRIM 800-160 MG PO TABS
1.0000 | ORAL_TABLET | Freq: Once | ORAL | Status: AC
  Administered 2014-09-13: 1 via ORAL
  Filled 2014-09-13: qty 1

## 2014-09-13 MED ORDER — PHENAZOPYRIDINE HCL 100 MG PO TABS
100.0000 mg | ORAL_TABLET | Freq: Once | ORAL | Status: AC
  Administered 2014-09-13: 100 mg via ORAL
  Filled 2014-09-13: qty 1

## 2014-09-13 MED ORDER — PHENAZOPYRIDINE HCL 100 MG PO TABS: 100.0000 mg | ORAL_TABLET | Freq: Three times a day (TID) | ORAL | Status: DC | PRN

## 2014-09-13 NOTE — ED Notes (Signed)
Pt. Ambulated to bathroom without difficulty, a/o x3.

## 2014-09-13 NOTE — Discharge Instructions (Signed)

## 2014-09-13 NOTE — ED Provider Notes (Signed)
CSN: [id number]     Arrival date & time 09/13/14  1743 History   First MD Initiated Contact with Patient 09/13/14 1750     Chief Complaint  Patient presents with  . Urethral pain      (Consider location/radiation/quality/duration/timing/severity/associated sxs/prior Treatment) HPI Comments: Here with dysuria and mild urgency. No relief with Urojet yesterday at [facility].   Patient is a 59 y.o. female presenting with female genitourinary complaint. The history is provided by the patient.  Female GU Problem This is a new problem. The current episode started more than 2 days ago. The problem occurs constantly. The problem has not changed since onset.Associated symptoms include abdominal pain. Pertinent negatives include no chest pain and no shortness of breath. Nothing aggravates the symptoms. Nothing relieves the symptoms.    Past Medical History  Diagnosis Date  . Ulcerative colitis   . Hydronephrosis, right   . Ejection fraction < 50%     EF 56%  AND NORMAL WALL MOTION --  PER  MUGA  09-06-2013  . 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 Wears glasses   . Hydronephrosis, right   . Chemotherapy induced thrombocytopenia   . Peripheral neuropathy due to chemotherapy   . Hypertension     PT CURRENT NOT TAKING BP MEDICATION PER PCP SINCE BP IS LOW SECONDARY TO CHEMO  . 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 Bone metastasis     MILD ---  SECONDARY OVARIAN CANCER  . 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 Cystoscopy w/ ureteral stent placement Right 01/12/2013    Procedure: CYSTOSCOPY WITH RETROGRADE PYELOGRAM/ RIGHT DOUBLE J STENT PLACEMENT;  Surgeon: Franchot Gallo, MD;  Location: [facility];  Service: Urology;  Laterality: Right;  . Cesarean section    . Cholecystectomy  1992  . Total colectomy w/ permanent ileostomy  1990   (2 PART SURGERY)    SEVERE UC  . Cystoscopy w/ ureteral stent placement Right 09/13/2013    Procedure: CYSTOSCOPY WITH STENT REPLACEMENT;  Surgeon: Franchot Gallo, MD;  Location: [facility];  Service: Urology;  Laterality: Right;  . Cystoscopy w/ ureteral stent placement Right 07/22/2014    Procedure: CYSTOSCOPY WITH STENT REPLACEMENT;  Surgeon: Jorja Loa, MD;  Location: [facility];  Service: Urology;  Laterality: Right;   Family History  Problem Relation Age of Onset  . Hypertension Mother   . Arthritis Mother   . Hypertension Father   . Diabetes Father   . Hypertension Brother   . Diabetes Brother    History  Substance Use Topics  . Smoking status: Never Smoker   . Smokeless tobacco: Never Used  . Alcohol Use: No   OB History    No data available     Review of Systems  Constitutional: Negative for fever.  Respiratory: Negative for cough and shortness of breath.   Cardiovascular: Negative for chest pain.  Gastrointestinal: Positive for abdominal pain. Negative for vomiting.  Genitourinary: Positive for dysuria.  All other systems reviewed and are negative.     Allergies  Penicillins  Home Medications   Prior to Admission medications   Medication Sig Start Date End Date Taking? Authorizing Provider  acyclovir (ZOVIRAX) 200 MG capsule Take 1 capsule (200 mg total) by mouth 3 (three) times daily as needed. For fever blister 10/03/13  Yes Lennis Marion Downer, MD  Calcium Carbonate-Vitamin D (CALCIUM + D PO) Take 1 tablet by mouth daily.    Yes Historical Provider, MD  diazepam (VALIUM) 2 MG tablet Take 1 mg by mouth daily as needed for anxiety.    Yes Historical Provider, MD  Fe Fum-FA-B Cmp-C-Zn-Mg-Mn-Cu (FERROCITE PLUS) 106-1 MG TABS Take 1 tablet by mouth daily. 04/25/14  Yes Lennis Marion Downer, MD  HYDROcodone-acetaminophen (NORCO/VICODIN) 5-325 MG per tablet Take 1/2- 1 tablet every 4-6 hours as needed for pain 09/09/14  Yes Lennis  P [name], MD  lidocaine-prilocaine (EMLA) cream Apply topically as needed. Apply 1-2 hours prior to Porta-cath access. 04/18/13  Yes Lennis Marion Downer, MD  LORazepam (ATIVAN) 0.5 MG tablet Take 1 tablet (0.5 mg total) by mouth every 6 (six) hours as needed (for nausea). 08/17/13  Yes Lennis Marion Downer, MD  morphine (MSIR) 15 MG tablet Take 0.5-1 tablets (7.5-15 mg total) by mouth every 4 (four) hours as needed for severe pain. 08/29/14  Yes Lennis Marion Downer, MD  Multiple Vitamin (MULTIVITAMIN WITH MINERALS) TABS tablet Take 1 tablet by mouth daily.   Yes Historical Provider, MD  ondansetron (ZOFRAN) 8 MG tablet Take 1-2 tablets (8-16 mg total) by mouth every 12 (twelve) hours as needed for nausea. 03/20/14  Yes Lennis Marion Downer, MD  oxybutynin (DITROPAN) 5 MG tablet Take 1 tablet (5 mg total) by mouth 3 (three) times daily. 09/09/14  Yes Lennis Marion Downer, MD  PRESCRIPTION MEDICATION Chemo at [facility]   Yes Historical Provider, MD  sodium chloride (OCEAN) 0.65 % nasal spray Place 1 spray into the nose as needed. Use every 1-3 hours while awake to keep nose moist, which helps to prevent nosebleeds   Yes Historical Provider, MD  lidocaine (XYLOCAINE) 2 % jelly Apply 1 application topically 2 (two) times daily as needed. For pain 09/12/14   Historical Provider, MD   BP 139/84 mmHg  Pulse 101  Temp(Src) 98 F (36.7 C) (Oral)  Resp 18  SpO2 96% Physical Exam  Constitutional: She is oriented to person, place, and time. She appears well-developed and well-nourished. No distress.  HENT:  Head: Normocephalic and atraumatic.  Mouth/Throat: Oropharynx is clear and moist.  Eyes: EOM are normal. Pupils are equal, round, and reactive to light.  Neck: Normal range of motion. Neck supple.  Cardiovascular: Normal rate and regular rhythm.  Exam reveals no friction rub.   No murmur heard. Pulmonary/Chest: Effort normal and breath sounds normal. No respiratory distress. She has no wheezes. She has no rales.  Abdominal:  Soft. She exhibits no distension. There is no tenderness. There is no rebound.  Musculoskeletal: Normal range of motion. She exhibits no edema.  Neurological: She is alert and oriented to person, place, and time. No cranial nerve deficit. She exhibits normal muscle tone. Coordination normal.  Skin: No rash noted. She is not diaphoretic.  Nursing note and vitals reviewed.   ED Course  Procedures (including critical care time) Labs Review Labs Reviewed  URINE CULTURE  URINALYSIS, ROUTINE W REFLEX MICROSCOPIC    Imaging Review No results found.   EKG Interpretation None      MDM   Final diagnoses:  Acute cystitis without hematuria    59 year old female here with ovarian cancer here with dysuria, urinary urgency. Was seen by urology yesterday and had lidocaine injected into her urethra which was not helpful. She has been dealing with neutropenia recently due to her chemotherapy. No fevers, nausea, vomiting. Mild lower abdominal pain,  similar to chronic due to her ovarian cancer. Will check for UTI.  UA shows UTI. Culture sent. Started on Bactrim.  Evelina Bucy, MD 09/13/14 [phone]

## 2014-09-13 NOTE — ED Notes (Signed)
Pt presents with c/o urethral pain. Pt is a cancer patient and had chemo this week, is about to go on a three week hiatus from chemo. Pt reporting pain in her urethra, called her urologist who gave her lidocaine injections. Her pain has since increased after the injections. Pt's WBC was low when she had chemo earlier this week.

## 2014-09-13 NOTE — ED Notes (Signed)
Bed: WA09 Expected date:  Expected time:  Means of arrival:  Comments: Ems- cancer pt 

## 2014-09-15 LAB — URINE CULTURE: Special Requests: NORMAL

## 2014-09-16 ENCOUNTER — Telehealth: Payer: Self-pay

## 2014-09-16 ENCOUNTER — Other Ambulatory Visit: Payer: Self-pay

## 2014-09-16 DIAGNOSIS — C569 Malignant neoplasm of unspecified ovary: Secondary | ICD-10-CM

## 2014-09-16 DIAGNOSIS — R300 Dysuria: Secondary | ICD-10-CM

## 2014-09-16 MED ORDER — MORPHINE SULFATE 15 MG PO TABS: 7.5000 mg | ORAL_TABLET | ORAL | Status: DC | PRN

## 2014-09-16 NOTE — Telephone Encounter (Signed)
Spoke with Kirsten Schroeder and told her that the urine culture done 09-13-14 was negative for an infection.  She states that the pyridium is helping a little with the discomfort. She can stop the bactrim prescribed. She has only been using a 1/2 of a Percocet 5/325 mg during the day ~ every 4 hrs and 1 whold one at HS wil much better relief. Told her that she needs to use the pain medication more regularly to get pain under control.  She had just taken a MSIR 15 mg tab.  She took this a few weeks ago when similar discomfort occurred  And was giving IV MS with very good effect quickly. She actually sounded as if she was in less pain during this conversation.  She stated she was more comfortable.  Told her to use the MSIR 15 mg every 4 hrs as needed.  She needs to take a pain pill when her pain level rises to 4-5/10.   Dr. Marko Plume said that her pain needs to be managed. Told Kirsten Schroeder that Dr. Marko Plume wanted her to think about possibly having the stent taken out and see if she would be more comfortable.  The left kidney is functioning well.  Dr. Marko Plume will discuss this more at her appointment on 09-30-14. Dr. Marko Plume also said that if she felt that she could not take the treatment on 09-25-14  that would be fine. A prescription for MSIR 15 mg will be up front for patient's son to pick up 09-17-14 between 1000-1600.  Kirsten Schroeder appreciated the phone call.

## 2014-09-18 LAB — URINALYSIS, ROUTINE W REFLEX MICROSCOPIC
Bilirubin Urine: NEGATIVE
Glucose, UA: NEGATIVE mg/dL
Ketones, ur: NEGATIVE mg/dL
NITRITE: NEGATIVE
PH: 6.5 (ref 5.0–8.0)
Protein, ur: 100 mg/dL — AB
SPECIFIC GRAVITY, URINE: 1.03 (ref 1.005–1.030)
Urobilinogen, UA: 0.2 mg/dL (ref 0.0–1.0)

## 2014-09-23 ENCOUNTER — Other Ambulatory Visit: Payer: Self-pay | Admitting: Oncology

## 2014-09-23 ENCOUNTER — Telehealth: Payer: Self-pay | Admitting: *Deleted

## 2014-09-23 ENCOUNTER — Telehealth: Payer: Self-pay | Admitting: Oncology

## 2014-09-23 DIAGNOSIS — R300 Dysuria: Secondary | ICD-10-CM

## 2014-09-23 DIAGNOSIS — C569 Malignant neoplasm of unspecified ovary: Secondary | ICD-10-CM

## 2014-09-23 NOTE — Telephone Encounter (Signed)
RN please watch out for labs on 09-25-14   thanks

## 2014-09-23 NOTE — Addendum Note (Signed)
Addended by: Cherylynn Ridges on: 09/23/2014 03:01 PM   Modules accepted: Orders

## 2014-09-23 NOTE — Telephone Encounter (Signed)
Kirsten Schroeder called requesting to leave message with Kirsten Schroeder.  Message observed from Dr. Marko Plume to check counts and urine if needed.  Kirsten Schroeder said yes she would like this and asked if she can come in on Wednesday.  Will notify schedulers to reschedule the 0800 lab appointment.   Kirsten Schroeder crying with this call.  Reports she "has been sick and in pain ever since the larger stent was placed and I think it's too big."

## 2014-09-23 NOTE — Telephone Encounter (Signed)
Labs/flush added back to 05/04 per 05/02 POF, pt is aware.... KJ

## 2014-09-23 NOTE — Telephone Encounter (Signed)
Kirsten Schroeder called reporting she does not wish to receive treatment on 09-25-2014.  Observed 09-16-2014 phone note by collaborative nurse.  Patient reports UTI, low blood counts and not feeling strong enough to receive treatment.  I will see her as scheduled on 09-30-2014."

## 2014-09-23 NOTE — Telephone Encounter (Signed)
Cancelled 05/04 visits per 05/02 POF...Marland KitchenMarland KitchenMarland Kitchen KJ

## 2014-09-23 NOTE — Telephone Encounter (Signed)
Fine to check urine or counts here if she needs that. Agree with holding chemo on 5-4 thanks

## 2014-09-25 ENCOUNTER — Ambulatory Visit (HOSPITAL_BASED_OUTPATIENT_CLINIC_OR_DEPARTMENT_OTHER): Payer: BLUE CROSS/BLUE SHIELD

## 2014-09-25 ENCOUNTER — Ambulatory Visit: Payer: BLUE CROSS/BLUE SHIELD

## 2014-09-25 ENCOUNTER — Other Ambulatory Visit: Payer: BLUE CROSS/BLUE SHIELD

## 2014-09-25 ENCOUNTER — Telehealth: Payer: Self-pay

## 2014-09-25 DIAGNOSIS — R300 Dysuria: Secondary | ICD-10-CM

## 2014-09-25 DIAGNOSIS — C801 Malignant (primary) neoplasm, unspecified: Secondary | ICD-10-CM | POA: Diagnosis not present

## 2014-09-25 DIAGNOSIS — C569 Malignant neoplasm of unspecified ovary: Secondary | ICD-10-CM

## 2014-09-25 LAB — CBC WITH DIFFERENTIAL/PLATELET
BASO%: 0.9 % (ref 0.0–2.0)
Basophils Absolute: 0.1 10*3/uL (ref 0.0–0.1)
EOS%: 1.9 % (ref 0.0–7.0)
Eosinophils Absolute: 0.1 10*3/uL (ref 0.0–0.5)
HCT: 34.7 % — ABNORMAL LOW (ref 34.8–46.6)
HGB: 11.4 g/dL — ABNORMAL LOW (ref 11.6–15.9)
LYMPH%: 12.6 % — ABNORMAL LOW (ref 14.0–49.7)
MCH: 31.5 pg (ref 25.1–34.0)
MCHC: 33 g/dL (ref 31.5–36.0)
MCV: 95.5 fL (ref 79.5–101.0)
MONO#: 1.1 10*3/uL — ABNORMAL HIGH (ref 0.1–0.9)
MONO%: 19.4 % — ABNORMAL HIGH (ref 0.0–14.0)
NEUT#: 3.7 10*3/uL (ref 1.5–6.5)
NEUT%: 65.2 % (ref 38.4–76.8)
Platelets: 411 10*3/uL — ABNORMAL HIGH (ref 145–400)
RBC: 3.63 10*6/uL — ABNORMAL LOW (ref 3.70–5.45)
RDW: 18.1 % — ABNORMAL HIGH (ref 11.2–14.5)
WBC: 5.6 10*3/uL (ref 3.9–10.3)
lymph#: 0.7 10*3/uL — ABNORMAL LOW (ref 0.9–3.3)

## 2014-09-25 LAB — COMPREHENSIVE METABOLIC PANEL (CC13)
ALK PHOS: 98 U/L (ref 40–150)
ALT: 8 U/L (ref 0–55)
ANION GAP: 10 meq/L (ref 3–11)
AST: 13 U/L (ref 5–34)
Albumin: 3.4 g/dL — ABNORMAL LOW (ref 3.5–5.0)
BILIRUBIN TOTAL: 0.23 mg/dL (ref 0.20–1.20)
BUN: 8.4 mg/dL (ref 7.0–26.0)
CO2: 28 mEq/L (ref 22–29)
CREATININE: 1 mg/dL (ref 0.6–1.1)
Calcium: 10 mg/dL (ref 8.4–10.4)
Chloride: 100 mEq/L (ref 98–109)
EGFR: 62 mL/min/{1.73_m2} — AB (ref >90)
GLUCOSE: 119 mg/dL (ref 70–140)
Potassium: 4.2 mEq/L (ref 3.5–5.1)
Sodium: 139 mEq/L (ref 136–145)
TOTAL PROTEIN: 6.6 g/dL (ref 6.4–8.3)

## 2014-09-25 LAB — URINALYSIS, MICROSCOPIC - CHCC
Bilirubin (Urine): NEGATIVE
Glucose: NEGATIVE mg/dL
Ketones: NEGATIVE mg/dL
NITRITE: NEGATIVE
PH: 6.5 (ref 4.6–8.0)
PROTEIN: 300 mg/dL
Specific Gravity, Urine: 1.015 (ref 1.003–1.035)
Urobilinogen, UR: 0.2 mg/dL (ref 0.2–1)

## 2014-09-25 LAB — MAGNESIUM (CC13): MAGNESIUM: 1.7 mg/dL (ref 1.5–2.5)

## 2014-09-25 MED ORDER — CIPROFLOXACIN HCL 250 MG PO TABS: 250.0000 mg | ORAL_TABLET | Freq: Two times a day (BID) | ORAL | Status: DC

## 2014-09-25 NOTE — Telephone Encounter (Signed)
Kirsten Schroeder is afebrile. She is having burning with urination and pain. Will send Cipro 250 mg bid x 3 days per Dr. Marko Plume. [name] verbalized understanding. Follow Urine culture daily until final per Dr. Marko Plume.

## 2014-09-26 LAB — URINE CULTURE

## 2014-09-26 LAB — CA 125: CA 125: 691 U/mL — ABNORMAL HIGH (ref <35)

## 2014-09-27 ENCOUNTER — Other Ambulatory Visit: Payer: Self-pay | Admitting: Oncology

## 2014-09-27 ENCOUNTER — Telehealth: Payer: Self-pay | Admitting: *Deleted

## 2014-09-27 NOTE — Telephone Encounter (Signed)
Notified patient that urine results no infection. Still having discomfort- feels it is the stint. Has spoken with urologist, has appt on 5/11. Pt is pushing self to do more each day.

## 2014-09-29 ENCOUNTER — Other Ambulatory Visit: Payer: Self-pay | Admitting: Oncology

## 2014-09-30 ENCOUNTER — Other Ambulatory Visit: Payer: BLUE CROSS/BLUE SHIELD

## 2014-09-30 ENCOUNTER — Encounter: Payer: Self-pay | Admitting: Oncology

## 2014-09-30 ENCOUNTER — Ambulatory Visit (HOSPITAL_BASED_OUTPATIENT_CLINIC_OR_DEPARTMENT_OTHER): Payer: BLUE CROSS/BLUE SHIELD | Admitting: Oncology

## 2014-09-30 ENCOUNTER — Telehealth: Payer: Self-pay | Admitting: Oncology

## 2014-09-30 VITALS — BP 131/73 | HR 83 | Temp 97.9°F | Resp 18 | Ht 61.0 in | Wt 132.1 lb

## 2014-09-30 DIAGNOSIS — R300 Dysuria: Secondary | ICD-10-CM

## 2014-09-30 DIAGNOSIS — C801 Malignant (primary) neoplasm, unspecified: Secondary | ICD-10-CM | POA: Diagnosis not present

## 2014-09-30 DIAGNOSIS — D649 Anemia, unspecified: Secondary | ICD-10-CM

## 2014-09-30 DIAGNOSIS — G893 Neoplasm related pain (acute) (chronic): Secondary | ICD-10-CM

## 2014-09-30 DIAGNOSIS — N1330 Unspecified hydronephrosis: Secondary | ICD-10-CM

## 2014-09-30 DIAGNOSIS — G622 Polyneuropathy due to other toxic agents: Secondary | ICD-10-CM | POA: Diagnosis not present

## 2014-09-30 DIAGNOSIS — C772 Secondary and unspecified malignant neoplasm of intra-abdominal lymph nodes: Secondary | ICD-10-CM

## 2014-09-30 DIAGNOSIS — Z95828 Presence of other vascular implants and grafts: Secondary | ICD-10-CM

## 2014-09-30 DIAGNOSIS — C569 Malignant neoplasm of unspecified ovary: Secondary | ICD-10-CM

## 2014-09-30 MED ORDER — MORPHINE SULFATE 15 MG PO TABS: 7.5000 mg | ORAL_TABLET | ORAL | Status: DC | PRN

## 2014-09-30 MED ORDER — HYDROCODONE-ACETAMINOPHEN 5-325 MG PO TABS: ORAL_TABLET | ORAL | Status: DC

## 2014-09-30 MED ORDER — TAMOXIFEN CITRATE 20 MG PO TABS
20.0000 mg | ORAL_TABLET | Freq: Every day | ORAL | Status: DC
Start: 2014-09-30 — End: 2015-03-03

## 2014-09-30 NOTE — Telephone Encounter (Signed)
Cancel lab per 5/69 pof

## 2014-09-30 NOTE — Progress Notes (Signed)
OFFICE PROGRESS NOTE   Sep 30, 2014   Physicians:ClarkePearson, Quillian Quince; Evalee Mutton, Marlene Bast, Floy Sabina, Annie Main  INTERVAL HISTORY:  Patient is seen, together with daughter and sister, in continuing attention to advanced gyn carcinoma. She has had more pelvic and bladder discomfort, which may be related to both right ureteral stent and cancer involvement, to the point that she has been unable to attempt chemotherapy since cycle 5 CDDP gemzar on 09-04-14.   Patient tells me that she has been mostly in bed, tho up and down to BR for frequent voiding, including "31 times from 0100 to 0400 last Friday" . She is reluctant to use pain medication, taking only 1/2 of vicoden tablets x 6 and one MSIR in last 24 hours; she is never completely comfortable. Bowels are moving via ostomy as usual. She has had no fever. She is eating small amounts and has had no vomiting. She has no gross hematuria.  She is to see Dr Diona Fanti on 10-02-14.  PAC in, flushed 09-04-14.  Right JJ stent Flu vaccine done   ONCOLOGIC HISTORY Patient presented with RLE swelling negative for DVT July 2014, and right hydronephrosis. Biopsy of left inguinal node ([id number]) found carcinoma consistent with serous gyn primary.Neoadjuvant dose dense taxol carboplatin began 01-31-13, with improvement in pelvic mass and adenopathy by scans after 3 cycles.. She had 3 additional cycles, with progressive decrease in CA 125 from 5626 in 12-2012 to 596 by Jan 2015. CT 06-14-13 had ~ stable pelvic mass and adenopathy. Dr Josephina Shih recommended another 3 cycles chemotherapy, regimen changed to q 3 week taxol carboplatin on 06-27-13, which unfortunately caused worsening of peripheral neuropathy and severe taxol aches.Taxotere was substituted for taxol due to neuropathy in feet for cycles 8 and 9, completed 08-08-13. CA 125 improved to low of 494 in Feb 2015, then 732 just prior to cycle 9. CT AP 08-22-13 had minimal improvement,  still not adequate to consider debulking surgery. She received doxil for 6 cycles, from 09-19-13 thru 02-06-14. CT AP 02-27-14 had some early increase in central pelvic mass, with treatment changed to gemzar beginning 03-29-14. CA 125 baseline for gemzar was 1932 on 03-27-14. Cycle 4 gemzar was given 05-22-14, with CA 125 2680. CDDP was added to gemzar beginning 06-05-14. She had cycle 5 CDDP gemzar on 09-04-14.   Review of systems as above, also: No other bleeding. No increased SOB. PAC ok. Swelling RLE at baseline. Remainder of 10 point Review of Systems negative.  Objective:  Vital signs in last 24 hours:  BP 131/73 mmHg  Pulse 83  Temp(Src) 97.9 F (36.6 C) (Oral)  Resp 18  Ht 5' 1"  (1.549 m)  Wt 132 lb 1.6 oz (59.92 kg)  BMI 24.97 kg/m2 Weight stable. Alert, oriented and appropriate. Ambulatory without assistance. Somewhat pale, looks somewhat uncomfortable and fatigued, but not in acute distress. Alopecia  HEENT:PERRL, sclerae not icteric. Oral mucosa moist without lesions, posterior pharynx clear.  Neck supple. No JVD.  Lymphatics:no cervical,supraclavicular adenopathy Resp: clear to auscultation bilaterally  Cardio: regular rate and rhythm. No gallop. GI: soft, not tender, few BS. Ostomy unremarkable.  Musculoskeletal/ Extremities: RLE tight swelling to above knee as chronic, LLE without pitting edema, cords, tenderness Neuro: no change peripheral neuropathy. Otherwise nonfocal. PSYCH appropriate mood and affect Skin without rash, ecchymosis, petechiae Portacath-without erythema or tenderness  Lab Results:  Results for orders placed or performed in visit on 09/25/14  Urine culture  Result Value Ref Range   Urine Culture, Routine Culture,  Urine   CBC with Differential  Result Value Ref Range   WBC 5.6 3.9 - 10.3 10e3/uL   NEUT# 3.7 1.5 - 6.5 10e3/uL   HGB 11.4 (L) 11.6 - 15.9 g/dL   HCT 34.7 (L) 34.8 - 46.6 %   Platelets 411 (H) 145 - 400 10e3/uL   MCV 95.5 79.5 - 101.0  fL   MCH 31.5 25.1 - 34.0 pg   MCHC 33.0 31.5 - 36.0 g/dL   RBC 3.63 (L) 3.70 - 5.45 10e6/uL   RDW 18.1 (H) 11.2 - 14.5 %   lymph# 0.7 (L) 0.9 - 3.3 10e3/uL   MONO# 1.1 (H) 0.1 - 0.9 10e3/uL   Eosinophils Absolute 0.1 0.0 - 0.5 10e3/uL   Basophils Absolute 0.1 0.0 - 0.1 10e3/uL   NEUT% 65.2 38.4 - 76.8 %   LYMPH% 12.6 (L) 14.0 - 49.7 %   MONO% 19.4 (H) 0.0 - 14.0 %   EOS% 1.9 0.0 - 7.0 %   BASO% 0.9 0.0 - 2.0 %  Comprehensive metabolic panel (Cmet) - [facility]  Result Value Ref Range   Sodium 139 136 - 145 mEq/L   Potassium 4.2 3.5 - 5.1 mEq/L   Chloride 100 98 - 109 mEq/L   CO2 28 22 - 29 mEq/L   Glucose 119 70 - 140 mg/dl   BUN 8.4 7.0 - 26.0 mg/dL   Creatinine 1.0 0.6 - 1.1 mg/dL   Total Bilirubin 0.23 0.20 - 1.20 mg/dL   Alkaline Phosphatase 98 40 - 150 U/L   AST 13 5 - 34 U/L   ALT 8 0 - 55 U/L   Total Protein 6.6 6.4 - 8.3 g/dL   Albumin 3.4 (L) 3.5 - 5.0 g/dL   Calcium 10.0 8.4 - 10.4 mg/dL   Anion Gap 10 3 - 11 mEq/L   EGFR 62 (L) >90 ml/min/1.73 m2  CA 125  Result Value Ref Range   CA 125 691 (H) <35 U/mL  Urinalysis, Microscopic - [facility]  Result Value Ref Range   Glucose Negative Negative mg/dL   Bilirubin (Urine) Negative Negative   Ketones Negative Negative mg/dL   Specific Gravity, Urine 1.015 1.003 - 1.035   Blood Large Negative   pH 6.5 4.6 - 8.0   Protein 300 Negative- <30 mg/dL   Urobilinogen, UR 0.2 0.2 - 1 mg/dL   Nitrite Negative Negative   Leukocyte Esterase Large Negative   RBC / HPF Field Obscured by WBCs 0 - 2   WBC, UA TNTC 0 - 2   Bacteria, UA Field obscured by WBCs Negative- Trace  Magnesium  Result Value Ref Range   Magnesium 1.7 1.5 - 2.5 mg/dl     Studies/Results:  No results found. Note pathology was ER + ([id number])  Medications: I have reviewed the patient's current medications. Depending on upcoming urology recommendations re stent, may need to add sustained release pain medication.  DISCUSSION: not clear if symptoms are stent  or stent + disease. I have told patient that I would be in agreement with removing the stent if Dr Diona Fanti feels that might help with symptoms, as her other kidney is functional and her cancer is advanced/ not potentially curable. Dr Diona Fanti is away until 5-11 when she is to see him, however I have LM with his RN in this regard.  I have told patient that CDDP gemzar is not improving situation and will DC this. We have discussed ER+ by path and will change treatment to tamoxifen, which hopefully will be  much easier on her otherwise than chemotherapy. We have discussed mechanism of action of the tamoxifen and possible side effects; I have told her that response to tamoxifen can take longer than chemotherapy. Patient is in agreement with plan to change treatment to tamoxifen.  Last imaging was 06-2014. Not discussed, may want to repeat CT AP in near future. She has not had radiation therapy consultation, which may be consideration.   Assessment/Plan:  1.advanced serous gyn carcinoma: treatment in  attempt to control disease, which is not surgically resectable. Symptom control is not adequate now. I have encouraged patient and family to liberalize her prn pain medication; may need MS Contin added 2.Right hydronephrosis with JJ stent: if removing stent would improve symptoms, I would be in favor of that now, even if lose function in right kidney. 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 Iron low normal on 09-04-14, continue oral iron as tolerated   All questions answered. I have left my contact information with Dr Dahlstedt's RN and am glad to discuss with him. I will see her back as  scheduled 10-14-14. Time spent 30 min including >50% counseling and coordination of care.    Gordy Levan, MD   09/30/2014, 12:33 PM

## 2014-10-08 ENCOUNTER — Telehealth: Payer: Self-pay

## 2014-10-08 DIAGNOSIS — C772 Secondary and unspecified malignant neoplasm of intra-abdominal lymph nodes: Secondary | ICD-10-CM

## 2014-10-08 NOTE — Telephone Encounter (Signed)
Kirsten Schroeder is much more comfortable with the addition of the Myrbetrig 25 mg daily by Dr. Diona Fanti at her visit on 10-02-14. Discussed the use of the MS contin as noted below by Dr. Marko Plume. Ms. Widdowson is agreeable to trying the Sustained release MS contin 15 mg.  She will pick up a prescription  tomorrow after 1300. She fine with setting up a CT scan as noted below by Dr. Marko Plume

## 2014-10-08 NOTE — Telephone Encounter (Signed)
-----   Message from Gordy Levan, MD sent at 10/05/2014  2:47 PM EDT ----- I spoke with Dr Diona Fanti at her visit on 10-02-14. He was trying another medication for stent discomfort. He was not immediately in agreement with removing the stent, preferred trying this new med first  I think she likely needs sustained release pain medication, suggest MS Contin 15 mg bid as she tolerated MS well. Please explain to her that this will keep low, steady level of pain medication in her system, and may control discomfort better than just immediate release. She can still use prn immediate release with the long lasting preparation; it will take ~ 3 doses of the MS Contin to build up enough to be able to tell. Dr Josephina Shih is in office on 5-16 and knows patient, so maybe he or Melissa could sign script 15 mg bid x 2 weeks ( I see her on 5-23).  I would also like CT AP in next week or so. I believe she prefers water based po contrast at radiology. Please tell her that depending on CT findings, we may want to ask radiation oncology to see her/ consider some directed radiation to the involvement impinging on bladder.  thanks

## 2014-10-09 ENCOUNTER — Other Ambulatory Visit: Payer: Self-pay

## 2014-10-09 DIAGNOSIS — C569 Malignant neoplasm of unspecified ovary: Secondary | ICD-10-CM

## 2014-10-09 MED ORDER — MORPHINE SULFATE ER 15 MG PO TBCR: 15.0000 mg | EXTENDED_RELEASE_TABLET | Freq: Two times a day (BID) | ORAL | Status: DC

## 2014-10-13 ENCOUNTER — Other Ambulatory Visit: Payer: Self-pay | Admitting: Oncology

## 2014-10-14 ENCOUNTER — Encounter: Payer: Self-pay | Admitting: Oncology

## 2014-10-14 ENCOUNTER — Other Ambulatory Visit (HOSPITAL_BASED_OUTPATIENT_CLINIC_OR_DEPARTMENT_OTHER): Payer: BLUE CROSS/BLUE SHIELD

## 2014-10-14 ENCOUNTER — Telehealth: Payer: Self-pay | Admitting: Oncology

## 2014-10-14 ENCOUNTER — Ambulatory Visit (HOSPITAL_BASED_OUTPATIENT_CLINIC_OR_DEPARTMENT_OTHER): Payer: BLUE CROSS/BLUE SHIELD | Admitting: Oncology

## 2014-10-14 VITALS — BP 119/68 | HR 63 | Temp 97.8°F | Resp 18 | Ht 61.0 in | Wt 131.2 lb

## 2014-10-14 DIAGNOSIS — G893 Neoplasm related pain (acute) (chronic): Secondary | ICD-10-CM

## 2014-10-14 DIAGNOSIS — D6481 Anemia due to antineoplastic chemotherapy: Secondary | ICD-10-CM

## 2014-10-14 DIAGNOSIS — C569 Malignant neoplasm of unspecified ovary: Secondary | ICD-10-CM

## 2014-10-14 DIAGNOSIS — C801 Malignant (primary) neoplasm, unspecified: Secondary | ICD-10-CM

## 2014-10-14 DIAGNOSIS — D649 Anemia, unspecified: Secondary | ICD-10-CM | POA: Diagnosis not present

## 2014-10-14 DIAGNOSIS — G620 Drug-induced polyneuropathy: Secondary | ICD-10-CM | POA: Diagnosis not present

## 2014-10-14 DIAGNOSIS — I890 Lymphedema, not elsewhere classified: Secondary | ICD-10-CM

## 2014-10-14 DIAGNOSIS — T451X5A Adverse effect of antineoplastic and immunosuppressive drugs, initial encounter: Secondary | ICD-10-CM

## 2014-10-14 DIAGNOSIS — C774 Secondary and unspecified malignant neoplasm of inguinal and lower limb lymph nodes: Secondary | ICD-10-CM

## 2014-10-14 LAB — CBC WITH DIFFERENTIAL/PLATELET
BASO%: 0.9 % (ref 0.0–2.0)
Basophils Absolute: 0 10*3/uL (ref 0.0–0.1)
EOS%: 2.6 % (ref 0.0–7.0)
Eosinophils Absolute: 0.1 10*3/uL (ref 0.0–0.5)
HEMATOCRIT: 34.9 % (ref 34.8–46.6)
HEMOGLOBIN: 11.3 g/dL — AB (ref 11.6–15.9)
LYMPH%: 17.5 % (ref 14.0–49.7)
MCH: 31.1 pg (ref 25.1–34.0)
MCHC: 32.5 g/dL (ref 31.5–36.0)
MCV: 95.7 fL (ref 79.5–101.0)
MONO#: 0.5 10*3/uL (ref 0.1–0.9)
MONO%: 11.3 % (ref 0.0–14.0)
NEUT#: 3.1 10*3/uL (ref 1.5–6.5)
NEUT%: 67.7 % (ref 38.4–76.8)
PLATELETS: 285 10*3/uL (ref 145–400)
RBC: 3.64 10*6/uL — ABNORMAL LOW (ref 3.70–5.45)
RDW: 16.6 % — AB (ref 11.2–14.5)
WBC: 4.6 10*3/uL (ref 3.9–10.3)
lymph#: 0.8 10*3/uL — ABNORMAL LOW (ref 0.9–3.3)

## 2014-10-14 LAB — COMPREHENSIVE METABOLIC PANEL (CC13)
ALK PHOS: 76 U/L (ref 40–150)
ALT: 8 U/L (ref 0–55)
AST: 17 U/L (ref 5–34)
Albumin: 3.4 g/dL — ABNORMAL LOW (ref 3.5–5.0)
Anion Gap: 11 mEq/L (ref 3–11)
BUN: 11 mg/dL (ref 7.0–26.0)
CO2: 28 meq/L (ref 22–29)
Calcium: 9 mg/dL (ref 8.4–10.4)
Chloride: 103 mEq/L (ref 98–109)
Creatinine: 0.9 mg/dL (ref 0.6–1.1)
EGFR: 71 mL/min/{1.73_m2} — ABNORMAL LOW (ref >90)
GLUCOSE: 106 mg/dL (ref 70–140)
Potassium: 3.9 mEq/L (ref 3.5–5.1)
Sodium: 142 mEq/L (ref 136–145)
TOTAL PROTEIN: 6.3 g/dL — AB (ref 6.4–8.3)
Total Bilirubin: 0.41 mg/dL (ref 0.20–1.20)

## 2014-10-14 MED ORDER — MORPHINE SULFATE ER 15 MG PO TBCR: 15.0000 mg | EXTENDED_RELEASE_TABLET | Freq: Two times a day (BID) | ORAL | Status: DC

## 2014-10-14 NOTE — Progress Notes (Signed)
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 She is much more comfortable on MSContin 15 mg bid begun 10-09-14 and now on Myrbetrig by Dr Diona Fanti. CT planned when I saw her on 09-30-14 has not been done, and since symptoms otherwise are much better on present medications without needing to consider RT, that scan will be cancelled. She is to see Dr Diona Fanti on 11-01-14  Patient has needed MSIR only once since beginning the MSContin "I did not realize I was in so much pain". She cannot tell that she is taking tamoxifen. Bowels are moving as usual via ostomy. She denies nausea, hematuria or other bleeding, any fever. Energy is better and she continues to try to eat 5x daily.    PAC in, flushed 09-04-14.  Right JJ stent Flu vaccine done      ONCOLOGIC HISTORY Patient presented with RLE swelling negative for DVT July 2014, and right hydronephrosis. Biopsy of left inguinal node ([id number]) found carcinoma consistent with serous gyn primary.Neoadjuvant dose dense taxol carboplatin began 01-31-13, with improvement in pelvic mass and adenopathy by scans after 3 cycles.. She had 3 additional cycles, with progressive decrease in CA 125 from 5626 in 12-2012 to 596 by Jan 2015. CT 06-14-13 had ~ stable pelvic mass and adenopathy. Dr Josephina Shih recommended another 3 cycles chemotherapy, regimen changed to q 3 week taxol carboplatin on 06-27-13, which unfortunately caused worsening of peripheral neuropathy and severe taxol aches.Taxotere was substituted for taxol due to neuropathy in feet for cycles 8 and 9, completed 08-08-13. CA 125 improved to low of 494 in Feb 2015, then 732 just prior to cycle 9. CT AP 08-22-13 had minimal improvement, still not adequate to consider  debulking surgery. She received doxil for 6 cycles, from 09-19-13 thru 02-06-14. CT AP 02-27-14 had some early increase in central pelvic mass, with treatment changed to gemzar beginning 03-29-14. CA 125 baseline for gemzar was 1932 on 03-27-14. Cycle 4 gemzar was given 05-22-14, with CA 125 2680. CDDP was added to gemzar beginning 06-05-14. She had cycle 5 CDDP gemzar on 09-04-14.     Review of systems as above, also: Not SOB with present activity level. Swelling RLE subjectively a bit better. No change peripheral neuropathy. Remainder of 10 point Review of Systems negative.  Objective:  Vital signs in last 24 hours:  BP 119/68 mmHg  Pulse 63  Temp(Src) 97.8 F (36.6 C) (Oral)  Resp 18  Ht _0  (1.549 m)  Wt 131 lb 3.2 oz (59.512 kg)  BMI 24.80 kg/m2  SpO2 99% Weight down 1 lb Alert, oriented and appropriate. Looks more comfortable, more easily mobile, ambulatory without assistance Alopecia  HEENT:PERRL, sclerae not icteric. Oral mucosa moist without lesions, posterior pharynx clear.  Neck supple. No JVD.  Lymphatics:no cervical,supraclavicular adenopathy Resp: clear to auscultation bilaterally  Cardio: regular rate and rhythm. No gallop. GI: soft, nontender, not distended, no mass or organomegaly. Some bowel sounds. Musculoskeletal/ Extremities: RLE tight swelling as previously, otherwise without pitting edema, cords, tenderness Neuro: no change peripheral neuropathy. Otherwise nonfocal. Psych brighter affect Skin without rash, ecchymosis, petechiae  Portacath-without erythema or tenderness  Lab Results:  Results for orders placed or performed in visit on 10/14/14  CBC with Differential  Result Value Ref Range   WBC 4.6 3.9 - 10.3 10e3/uL  NEUT# 3.1 1.5 - 6.5 10e3/uL   HGB 11.3 (L) 11.6 - 15.9 g/dL   HCT 34.9 34.8 - 46.6 %   Platelets 285 145 - 400 10e3/uL   MCV 95.7 79.5 - 101.0 fL   MCH 31.1 25.1 - 34.0 pg   MCHC 32.5 31.5 - 36.0 g/dL   RBC 3.64 (L) 3.70 - 5.45  10e6/uL   RDW 16.6 (H) 11.2 - 14.5 %   lymph# 0.8 (L) 0.9 - 3.3 10e3/uL   MONO# 0.5 0.1 - 0.9 10e3/uL   Eosinophils Absolute 0.1 0.0 - 0.5 10e3/uL   Basophils Absolute 0.0 0.0 - 0.1 10e3/uL   NEUT% 67.7 38.4 - 76.8 %   LYMPH% 17.5 14.0 - 49.7 %   MONO% 11.3 0.0 - 14.0 %   EOS% 2.6 0.0 - 7.0 %   BASO% 0.9 0.0 - 2.0 %  Comprehensive metabolic panel (Cmet) - [facility]  Result Value Ref Range   Sodium 142 136 - 145 mEq/L   Potassium 3.9 3.5 - 5.1 mEq/L   Chloride 103 98 - 109 mEq/L   CO2 28 22 - 29 mEq/L   Glucose 106 70 - 140 mg/dl   BUN 11.0 7.0 - 26.0 mg/dL   Creatinine 0.9 0.6 - 1.1 mg/dL   Total Bilirubin 0.41 0.20 - 1.20 mg/dL   Alkaline Phosphatase 76 40 - 150 U/L   AST 17 5 - 34 U/L   ALT 8 0 - 55 U/L   Total Protein 6.3 (L) 6.4 - 8.3 g/dL   Albumin 3.4 (L) 3.5 - 5.0 g/dL   Calcium 9.0 8.4 - 10.4 mg/dL   Anion Gap 11 3 - 11 mEq/L   EGFR 71 (L) >90 ml/min/1.73 m2     Studies/Results:  No results found.  Medications: I have reviewed the patient's current medications. Prescription for MSContin given, which can be filed at pharmacy until time to fill next week. Patient is pleased with present medications and willing to continue.   DISCUSSION: medications as above. Cancel CT for now. Continue tamoxifen   Assessment/Plan:  1.advanced serous gyn carcinoma: unable to tolerate recent chemotherapy and now on tamoxifen. Symptoms better with addition of MSContin. I will see her in 3-4 weeks with labs.  2.Right hydronephrosis with JJ stent: symptoms much better with myrbetrig and the MS Contin. 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 Iron low normal on 09-04-14, continue oral iron as tolerated   All questions  answered. Patient knows to call if needed prior to next scheduled appointment. Time spent 20 min including >50% counseling and coordination of care.   Claron Rosencrans P, MD   10/14/2014, 4:11 PM

## 2014-10-14 NOTE — Telephone Encounter (Signed)
Gave avs & calendar for June. °

## 2014-10-16 ENCOUNTER — Ambulatory Visit (HOSPITAL_COMMUNITY): Payer: BLUE CROSS/BLUE SHIELD

## 2014-10-16 ENCOUNTER — Ambulatory Visit: Payer: BLUE CROSS/BLUE SHIELD

## 2014-10-23 ENCOUNTER — Telehealth: Payer: Self-pay

## 2014-10-23 NOTE — Telephone Encounter (Signed)
Her appetite is decreased with the heat.   She would like a recommendation for protein bars with out nuts as she cannot take nuts with her ileostomy. She also cannot drink the protein shakes as they will cause diarrhea with the ileostomy. Ms. Kirsten Schroeder can eat eggs.  Suggested she eat an egg a day or so. She can prepare the eggs different way. Told her that Kirsten Schroeder RD will call her tomorrow to discuss options for protein.  Told Ms. Kirsten Schroeder that she can  Call on Friday and leave a message if the dietitian has not called her. Ms. Kirsten Schroeder verbalized understanding.

## 2014-10-24 ENCOUNTER — Telehealth: Payer: Self-pay | Admitting: Nutrition

## 2014-10-24 NOTE — Telephone Encounter (Signed)
Contacted patient by phone. She was unavailable. Left message with my name and phone number for return call.

## 2014-10-24 NOTE — Telephone Encounter (Signed)
Have attempted to contact patient 3 times this morning and line is busy. Will continue to try to call her later today.

## 2014-10-25 ENCOUNTER — Telehealth: Payer: Self-pay | Admitting: Nutrition

## 2014-10-25 NOTE — Telephone Encounter (Signed)
Patient looking for ways to increase protein intake. Patient has added eggs and yogurt to her diet. She is also consuming chicken and fish. Educated patient to continue increased protein foods. Provided information on protein bars without nuts at her request.

## 2014-11-01 ENCOUNTER — Ambulatory Visit (HOSPITAL_BASED_OUTPATIENT_CLINIC_OR_DEPARTMENT_OTHER): Payer: BC Managed Care – PPO

## 2014-11-01 VITALS — BP 129/70 | HR 94 | Temp 98.6°F | Resp 18

## 2014-11-01 DIAGNOSIS — Z452 Encounter for adjustment and management of vascular access device: Secondary | ICD-10-CM

## 2014-11-01 DIAGNOSIS — C801 Malignant (primary) neoplasm, unspecified: Secondary | ICD-10-CM | POA: Diagnosis not present

## 2014-11-01 DIAGNOSIS — Z95828 Presence of other vascular implants and grafts: Secondary | ICD-10-CM

## 2014-11-01 MED ORDER — SODIUM CHLORIDE 0.9 % IJ SOLN
10.0000 mL | INTRAMUSCULAR | Status: DC | PRN
  Administered 2014-11-01: 10 mL via INTRAVENOUS
  Filled 2014-11-01: qty 10

## 2014-11-01 MED ORDER — HEPARIN SOD (PORK) LOCK FLUSH 100 UNIT/ML IV SOLN
500.0000 [IU] | Freq: Once | INTRAVENOUS | Status: AC
  Administered 2014-11-01: 500 [IU] via INTRAVENOUS
  Filled 2014-11-01: qty 5

## 2014-11-01 NOTE — Patient Instructions (Signed)

## 2014-11-17 ENCOUNTER — Other Ambulatory Visit: Payer: Self-pay | Admitting: Oncology

## 2014-11-17 DIAGNOSIS — C569 Malignant neoplasm of unspecified ovary: Secondary | ICD-10-CM

## 2014-11-17 DIAGNOSIS — C772 Secondary and unspecified malignant neoplasm of intra-abdominal lymph nodes: Secondary | ICD-10-CM

## 2014-11-18 ENCOUNTER — Ambulatory Visit (HOSPITAL_BASED_OUTPATIENT_CLINIC_OR_DEPARTMENT_OTHER): Payer: BC Managed Care – PPO | Admitting: Oncology

## 2014-11-18 ENCOUNTER — Telehealth: Payer: Self-pay | Admitting: Oncology

## 2014-11-18 ENCOUNTER — Other Ambulatory Visit (HOSPITAL_BASED_OUTPATIENT_CLINIC_OR_DEPARTMENT_OTHER): Payer: BC Managed Care – PPO

## 2014-11-18 ENCOUNTER — Encounter: Payer: Self-pay | Admitting: Oncology

## 2014-11-18 ENCOUNTER — Ambulatory Visit (HOSPITAL_BASED_OUTPATIENT_CLINIC_OR_DEPARTMENT_OTHER): Payer: BC Managed Care – PPO

## 2014-11-18 VITALS — BP 134/66 | HR 80 | Temp 98.3°F | Resp 18 | Ht 61.0 in | Wt 125.9 lb

## 2014-11-18 DIAGNOSIS — R300 Dysuria: Secondary | ICD-10-CM

## 2014-11-18 DIAGNOSIS — Z9889 Other specified postprocedural states: Secondary | ICD-10-CM | POA: Diagnosis not present

## 2014-11-18 DIAGNOSIS — T8384XD Pain from genitourinary prosthetic devices, implants and grafts, subsequent encounter: Secondary | ICD-10-CM

## 2014-11-18 DIAGNOSIS — G893 Neoplasm related pain (acute) (chronic): Secondary | ICD-10-CM | POA: Diagnosis not present

## 2014-11-18 DIAGNOSIS — C772 Secondary and unspecified malignant neoplasm of intra-abdominal lymph nodes: Secondary | ICD-10-CM

## 2014-11-18 DIAGNOSIS — C569 Malignant neoplasm of unspecified ovary: Secondary | ICD-10-CM | POA: Diagnosis not present

## 2014-11-18 DIAGNOSIS — Z95828 Presence of other vascular implants and grafts: Secondary | ICD-10-CM

## 2014-11-18 DIAGNOSIS — D6481 Anemia due to antineoplastic chemotherapy: Secondary | ICD-10-CM

## 2014-11-18 DIAGNOSIS — T451X5A Adverse effect of antineoplastic and immunosuppressive drugs, initial encounter: Secondary | ICD-10-CM

## 2014-11-18 LAB — CBC WITH DIFFERENTIAL/PLATELET
BASO%: 0.4 % (ref 0.0–2.0)
Basophils Absolute: 0 10*3/uL (ref 0.0–0.1)
EOS%: 0.8 % (ref 0.0–7.0)
Eosinophils Absolute: 0 10*3/uL (ref 0.0–0.5)
HEMATOCRIT: 36.1 % (ref 34.8–46.6)
HGB: 11.9 g/dL (ref 11.6–15.9)
LYMPH#: 0.8 10*3/uL — AB (ref 0.9–3.3)
LYMPH%: 15.8 % (ref 14.0–49.7)
MCH: 30.9 pg (ref 25.1–34.0)
MCHC: 33 g/dL (ref 31.5–36.0)
MCV: 93.8 fL (ref 79.5–101.0)
MONO#: 0.4 10*3/uL (ref 0.1–0.9)
MONO%: 8.3 % (ref 0.0–14.0)
NEUT#: 3.9 10*3/uL (ref 1.5–6.5)
NEUT%: 74.7 % (ref 38.4–76.8)
Platelets: 179 10*3/uL (ref 145–400)
RBC: 3.85 10*6/uL (ref 3.70–5.45)
RDW: 13.6 % (ref 11.2–14.5)
WBC: 5.2 10*3/uL (ref 3.9–10.3)

## 2014-11-18 LAB — COMPREHENSIVE METABOLIC PANEL (CC13)
ALK PHOS: 65 U/L (ref 40–150)
ALT: 14 U/L (ref 0–55)
AST: 19 U/L (ref 5–34)
Albumin: 3.3 g/dL — ABNORMAL LOW (ref 3.5–5.0)
Anion Gap: 7 mEq/L (ref 3–11)
BILIRUBIN TOTAL: 0.3 mg/dL (ref 0.20–1.20)
BUN: 11.4 mg/dL (ref 7.0–26.0)
CALCIUM: 8.9 mg/dL (ref 8.4–10.4)
CHLORIDE: 104 meq/L (ref 98–109)
CO2: 28 mEq/L (ref 22–29)
CREATININE: 0.7 mg/dL (ref 0.6–1.1)
EGFR: 88 mL/min/{1.73_m2} — ABNORMAL LOW (ref >90)
Glucose: 108 mg/dl (ref 70–140)
Potassium: 4 mEq/L (ref 3.5–5.1)
Sodium: 140 mEq/L (ref 136–145)
Total Protein: 6.1 g/dL — ABNORMAL LOW (ref 6.4–8.3)

## 2014-11-18 LAB — CA 125: CA 125: 3202 U/mL — ABNORMAL HIGH (ref <35)

## 2014-11-18 MED ORDER — MORPHINE SULFATE 15 MG PO TABS: 7.5000 mg | ORAL_TABLET | ORAL | Status: DC | PRN

## 2014-11-18 MED ORDER — HEPARIN SOD (PORK) LOCK FLUSH 100 UNIT/ML IV SOLN
500.0000 [IU] | Freq: Once | INTRAVENOUS | Status: AC
  Administered 2014-11-18: 500 [IU] via INTRAVENOUS
  Filled 2014-11-18: qty 5

## 2014-11-18 MED ORDER — SODIUM CHLORIDE 0.9 % IJ SOLN
10.0000 mL | INTRAMUSCULAR | Status: DC | PRN
  Administered 2014-11-18: 10 mL via INTRAVENOUS
  Filled 2014-11-18: qty 10

## 2014-11-18 MED ORDER — MORPHINE SULFATE ER 15 MG PO TBCR: 15.0000 mg | EXTENDED_RELEASE_TABLET | Freq: Two times a day (BID) | ORAL | Status: DC

## 2014-11-18 NOTE — Progress Notes (Signed)
OFFICE PROGRESS NOTE   November 18, 2014   Physicians:ClarkePearson, Quillian Quince; Evalee Mutton, Marlene Bast, Lorriane Shire; Dahlstedt, Annie Main  INTERVAL HISTORY:  Patient is seen, together with son, in continuing attention to advanced serous gyn carcinoma, for which she has been on tamoxifen since 09-30-14. She is tolerating the tamoxifen with no side effects at all. Last imaging was CT AP 07-19-14. Last systemic chemo was cycle 5 gemzar CDDP 09-04-14.  Patient is glad to be on break from chemotherapy, with pain still mostly controlled with MS Contin 15 mg bid, needing MSIR only a couple of times in past week for pain that is low pelvis described as "I am sitting on tumor". Output from ostomy is good, unchanged. Bladder/ stent symptoms have been much better on Myrbetriq, which she wants to change from late afternoon to AM dosing, as she feels this causes frequent urination until ~ 2 AM on present schedule. States "bottom is irritated" from so much voiding at night.She denies gross hematuria and has had no fever or chills. Appetite is not as good recently, discussed with [facility] nutritionist by phone this week and has added protein bars. No nausea or vomiting.   PAC in, flushed today Right JJ stent  ONCOLOGIC HISTORY  Patient presented with RLE swelling negative for DVT July 2014, and right hydronephrosis. Biopsy of left inguinal node ([id number]) found carcinoma consistent with serous gyn primary.Neoadjuvant dose dense taxol carboplatin began 01-31-13, with improvement in pelvic mass and adenopathy by scans after 3 cycles.. She had 3 additional cycles, with progressive decrease in CA 125 from 5626 in 12-2012 to 596 by Jan 2015. CT 06-14-13 had ~ stable pelvic mass and adenopathy. Dr Josephina Shih recommended another 3 cycles chemotherapy, regimen changed to q 3 week taxol carboplatin on 06-27-13, which unfortunately caused worsening of peripheral neuropathy and severe taxol aches.Taxotere was substituted for  taxol due to neuropathy in feet for cycles 8 and 9, completed 08-08-13. CA 125 improved to low of 494 in Feb 2015, then 732 just prior to cycle 9. CT AP 08-22-13 had minimal improvement, still not adequate to consider debulking surgery. She received doxil for 6 cycles, from 09-19-13 thru 02-06-14. CT AP 02-27-14 had some early increase in central pelvic mass, with treatment changed to gemzar beginning 03-29-14. CA 125 baseline for gemzar was 1932 on 03-27-14. Cycle 4 gemzar was given 05-22-14, with CA 125 2680. CDDP was added to gemzar beginning 06-05-14. She had cycle 5 CDDP gemzar on 09-04-14. Treatment changed to tamoxifen 09-30-14 due to poor tolerance of CDDP gemzar without clinical improvement.     Review of systems as above, also: No SOB or other respiratory symptoms. No other bleeding. No increased swelling LE. More fatigue, but has been helping clean out mother's house. Using A&D ointment to perineum Remainder of 10 point Review of Systems negative.  Objective:  Vital signs in last 24 hours:  BP 134/66 mmHg  Pulse 80  Temp(Src) 98.3 F (36.8 C) (Oral)  Resp 18  Ht _0  (1.549 m)  Wt 125 lb 14.4 oz (57.108 kg)  BMI 23.80 kg/m2  SpO2 99% Weight is down 5 lbs. Alert, oriented and appropriate. Ambulatory without difficulty. Unable to lie back at ~ 60 degrees on exam table due to pain low pelvis.  HEENT:PERRL, sclerae not icteric. Oral mucosa moist without lesions, posterior pharynx clear.  Neck supple. No JVD.  Lymphatics:no supraclavicular adenopathy Resp: clear to auscultation bilaterally and normal percussion bilaterally Cardio: regular rate and rhythm. No gallop. GI: soft, nontender,  not distended, no mass or organomegaly. Normally active bowel sounds. Ostomy not remarkable Musculoskeletal/ Extremities: RLE swelling not tight, otherwise without pitting edema, cords, tenderness Neuro: no change peripheral neuropathy. Otherwise nonfocal. PSYCH appropriate mood and affect Skin without  rash, ecchymosis, petechiae, tho I did not look at perineum with son here. Portacath-without erythema or tenderness  Lab Results:  Results for orders placed or performed in visit on 11/18/14  CBC with Differential  Result Value Ref Range   WBC 5.2 3.9 - 10.3 10e3/uL   NEUT# 3.9 1.5 - 6.5 10e3/uL   HGB 11.9 11.6 - 15.9 g/dL   HCT 36.1 34.8 - 46.6 %   Platelets 179 145 - 400 10e3/uL   MCV 93.8 79.5 - 101.0 fL   MCH 30.9 25.1 - 34.0 pg   MCHC 33.0 31.5 - 36.0 g/dL   RBC 3.85 3.70 - 5.45 10e6/uL   RDW 13.6 11.2 - 14.5 %   lymph# 0.8 (L) 0.9 - 3.3 10e3/uL   MONO# 0.4 0.1 - 0.9 10e3/uL   Eosinophils Absolute 0.0 0.0 - 0.5 10e3/uL   Basophils Absolute 0.0 0.0 - 0.1 10e3/uL   NEUT% 74.7 38.4 - 76.8 %   LYMPH% 15.8 14.0 - 49.7 %   MONO% 8.3 0.0 - 14.0 %   EOS% 0.8 0.0 - 7.0 %   BASO% 0.4 0.0 - 2.0 %  Comprehensive metabolic panel (Cmet) - [facility]  Result Value Ref Range   Sodium 140 136 - 145 mEq/L   Potassium 4.0 3.5 - 5.1 mEq/L   Chloride 104 98 - 109 mEq/L   CO2 28 22 - 29 mEq/L   Glucose 108 70 - 140 mg/dl   BUN 11.4 7.0 - 26.0 mg/dL   Creatinine 0.7 0.6 - 1.1 mg/dL   Total Bilirubin 0.30 0.20 - 1.20 mg/dL   Alkaline Phosphatase 65 40 - 150 U/L   AST 19 5 - 34 U/L   ALT 14 0 - 55 U/L   Total Protein 6.1 (L) 6.4 - 8.3 g/dL   Albumin 3.3 (L) 3.5 - 5.0 g/dL   Calcium 8.9 8.4 - 10.4 mg/dL   Anion Gap 7 3 - 11 mEq/L   EGFR 88 (L) >90 ml/min/1.73 m2   CA 125 available after visit up to 3202, having been 691 on 09-25-14 (may not have been correct value) and 1234 in March.  Studies/Results:  No results found. Last CT AP 07-19-14  Medications: I have reviewed the patient's current medications. Patient requests moving Myrbetriq to AM due to increased voiding from this thru much of night when she takes at 1700; drug information reveiwed now and no reason not to move the time of administration.  No change in MS Contin now.  DISCUSSION: overall symptoms more tolerable, tho low pelvic  discomfort and weight loss noted. Hopefully can give tamoxifen another 1-2 months to see if any response, tho would re-evaluate sooner if concerns.   Assessment/Plan:  1.advanced serous gyn carcinoma: unable to tolerate recent chemotherapy and now on tamoxifen since early May. I will see her in ~ 4 weeks, may need to repeat scans after that visit.  2.Right hydronephrosis with JJ stent: symptoms much better with myrbetrig and the MS Contin. Dr Diona Fanti following. Change myrbetriq to AM dosing. 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 Improved off of chemo  now. Iron low normal on 09-04-14, continue oral iron as tolerated   All questions answered. TIme spent 25 min including >50% counseling and coordination of care. She knows to call if needed prior to next scheduled visit   Khara Renaud P, MD   11/18/2014, 12:15 PM

## 2014-11-18 NOTE — Telephone Encounter (Signed)
Number busy ,calendar was mailed to patient

## 2014-11-18 NOTE — Patient Instructions (Signed)

## 2014-11-19 ENCOUNTER — Telehealth: Payer: Self-pay

## 2014-11-19 NOTE — Telephone Encounter (Signed)
Told Ms. Volante the results of the CA-125 as noted below by Dr. Marko Plume. Ms. Stotts verbalized understanding.

## 2014-11-19 NOTE — Telephone Encounter (Signed)
-----   Message from Gordy Levan, MD sent at 11/19/2014  8:32 AM EDT ----- Labs seen and need follow up: OK to let her know CA 125 resulted higher at 3200, having been 690 in early May (which may not have been correct value) and 1000-2000 over last several months. We expected marker might not show improvement in short time that she has been on tamoxifen noq. We will keep watching it closely for now.

## 2014-12-09 ENCOUNTER — Encounter: Payer: Self-pay | Admitting: Oncology

## 2014-12-09 ENCOUNTER — Ambulatory Visit (HOSPITAL_BASED_OUTPATIENT_CLINIC_OR_DEPARTMENT_OTHER): Payer: BC Managed Care – PPO | Admitting: Oncology

## 2014-12-09 ENCOUNTER — Telehealth: Payer: Self-pay | Admitting: Oncology

## 2014-12-09 ENCOUNTER — Ambulatory Visit (HOSPITAL_BASED_OUTPATIENT_CLINIC_OR_DEPARTMENT_OTHER): Payer: BC Managed Care – PPO

## 2014-12-09 ENCOUNTER — Other Ambulatory Visit (HOSPITAL_BASED_OUTPATIENT_CLINIC_OR_DEPARTMENT_OTHER): Payer: BC Managed Care – PPO

## 2014-12-09 VITALS — BP 131/65 | HR 82 | Temp 98.7°F | Resp 18 | Ht 61.0 in | Wt 123.6 lb

## 2014-12-09 DIAGNOSIS — C774 Secondary and unspecified malignant neoplasm of inguinal and lower limb lymph nodes: Secondary | ICD-10-CM

## 2014-12-09 DIAGNOSIS — C569 Malignant neoplasm of unspecified ovary: Secondary | ICD-10-CM

## 2014-12-09 DIAGNOSIS — C801 Malignant (primary) neoplasm, unspecified: Secondary | ICD-10-CM

## 2014-12-09 DIAGNOSIS — Z95828 Presence of other vascular implants and grafts: Secondary | ICD-10-CM

## 2014-12-09 DIAGNOSIS — G622 Polyneuropathy due to other toxic agents: Secondary | ICD-10-CM

## 2014-12-09 DIAGNOSIS — G893 Neoplasm related pain (acute) (chronic): Secondary | ICD-10-CM

## 2014-12-09 DIAGNOSIS — D649 Anemia, unspecified: Secondary | ICD-10-CM

## 2014-12-09 DIAGNOSIS — D6481 Anemia due to antineoplastic chemotherapy: Secondary | ICD-10-CM

## 2014-12-09 DIAGNOSIS — I890 Lymphedema, not elsewhere classified: Secondary | ICD-10-CM

## 2014-12-09 DIAGNOSIS — R634 Abnormal weight loss: Secondary | ICD-10-CM | POA: Diagnosis not present

## 2014-12-09 DIAGNOSIS — R300 Dysuria: Secondary | ICD-10-CM

## 2014-12-09 DIAGNOSIS — T451X5A Adverse effect of antineoplastic and immunosuppressive drugs, initial encounter: Secondary | ICD-10-CM

## 2014-12-09 DIAGNOSIS — C772 Secondary and unspecified malignant neoplasm of intra-abdominal lymph nodes: Secondary | ICD-10-CM

## 2014-12-09 LAB — CBC WITH DIFFERENTIAL/PLATELET
BASO%: 0.9 % (ref 0.0–2.0)
BASOS ABS: 0.1 10*3/uL (ref 0.0–0.1)
EOS ABS: 0.1 10*3/uL (ref 0.0–0.5)
EOS%: 0.9 % (ref 0.0–7.0)
HEMATOCRIT: 37 % (ref 34.8–46.6)
HGB: 12.1 g/dL (ref 11.6–15.9)
LYMPH%: 12.6 % — ABNORMAL LOW (ref 14.0–49.7)
MCH: 29.8 pg (ref 25.1–34.0)
MCHC: 32.7 g/dL (ref 31.5–36.0)
MCV: 91 fL (ref 79.5–101.0)
MONO#: 0.5 10*3/uL (ref 0.1–0.9)
MONO%: 7.9 % (ref 0.0–14.0)
NEUT#: 5.3 10*3/uL (ref 1.5–6.5)
NEUT%: 77.7 % — ABNORMAL HIGH (ref 38.4–76.8)
Platelets: 263 10*3/uL (ref 145–400)
RBC: 4.06 10*6/uL (ref 3.70–5.45)
RDW: 14.1 % (ref 11.2–14.5)
WBC: 6.8 10*3/uL (ref 3.9–10.3)
lymph#: 0.9 10*3/uL (ref 0.9–3.3)

## 2014-12-09 LAB — COMPREHENSIVE METABOLIC PANEL (CC13)
ALT: 13 U/L (ref 0–55)
AST: 19 U/L (ref 5–34)
Albumin: 3.2 g/dL — ABNORMAL LOW (ref 3.5–5.0)
Alkaline Phosphatase: 63 U/L (ref 40–150)
Anion Gap: 5 mEq/L (ref 3–11)
BILIRUBIN TOTAL: 0.27 mg/dL (ref 0.20–1.20)
BUN: 8.4 mg/dL (ref 7.0–26.0)
CALCIUM: 9.3 mg/dL (ref 8.4–10.4)
CHLORIDE: 103 meq/L (ref 98–109)
CO2: 29 meq/L (ref 22–29)
Creatinine: 0.8 mg/dL (ref 0.6–1.1)
EGFR: 84 mL/min/{1.73_m2} — AB (ref >90)
GLUCOSE: 108 mg/dL (ref 70–140)
Potassium: 4 mEq/L (ref 3.5–5.1)
SODIUM: 137 meq/L (ref 136–145)
TOTAL PROTEIN: 6.3 g/dL — AB (ref 6.4–8.3)

## 2014-12-09 MED ORDER — MORPHINE SULFATE ER 15 MG PO TBCR: 15.0000 mg | EXTENDED_RELEASE_TABLET | Freq: Two times a day (BID) | ORAL | Status: DC

## 2014-12-09 MED ORDER — MORPHINE SULFATE 15 MG PO TABS: 7.5000 mg | ORAL_TABLET | ORAL | Status: DC | PRN

## 2014-12-09 MED ORDER — SODIUM CHLORIDE 0.9 % IJ SOLN
10.0000 mL | INTRAMUSCULAR | Status: DC | PRN
  Administered 2014-12-09: 10 mL via INTRAVENOUS
  Filled 2014-12-09: qty 10

## 2014-12-09 MED ORDER — MORPHINE SULFATE ER 30 MG PO TBCR: 30.0000 mg | EXTENDED_RELEASE_TABLET | Freq: Two times a day (BID) | ORAL | Status: DC

## 2014-12-09 MED ORDER — HEPARIN SOD (PORK) LOCK FLUSH 100 UNIT/ML IV SOLN
500.0000 [IU] | Freq: Once | INTRAVENOUS | Status: AC
  Administered 2014-12-09: 500 [IU] via INTRAVENOUS
  Filled 2014-12-09: qty 5

## 2014-12-09 NOTE — Telephone Encounter (Signed)
Gave avs & calendar for July °

## 2014-12-09 NOTE — Progress Notes (Signed)
OFFICE PROGRESS NOTE   December 09, 2014   Physicians:ClarkePearson, Quillian Quince; Evalee Mutton, Daniel; [place], Lorriane Shire; Dahlstedt, Annie Main  INTERVAL HISTORY:  Patient is seen, a week earlier than initially scheduled at her request due to increased discomfort low pelvis "when I sit". She is accompanied by son. She continues tamoxifen, this begun 09-30-14.   Present MSContin 15 mg bid, which had controlled symptoms well initially, now "wears off" ~ 1500 - 1700 for past 1-2 weeks,  uses MSIR 15 mg in late afternoons ~ qod for the discomfort. Bowels are moving well as usual via colostomy. Appetite is not great, but is eating small meals ~ 5x daily. No bleeding, no other pain, minimal nausea without vomiting. No increased swelling RLE. No increased or different symtoms with ureteral stent.No side effects from tamoxifen apparent.  Last CA 125 11-18-14 was 3200, this having been 691 on 09-25-14 and 1234 on 08-14-14.  PAC in Right JJ stent No genetics testing.  ONCOLOGIC HISTORY Patient presented with RLE swelling negative for DVT July 2014, and right hydronephrosis. 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 She had 3 additional cycles, with progressive decrease in CA 125 from 5626 in 12-2012 to 596 by Jan 2015. CT 06-14-13 had ~ stable pelvic mass and adenopathy. Dr Josephina Shih recommended another 3 cycles chemotherapy, regimen changed to q 3 week taxol carboplatin on 06-27-13, which unfortunately caused worsening of peripheral neuropathy and severe taxol aches.Taxotere was substituted for taxol due to neuropathy in feet for cycles 8 and 9, completed 08-08-13. CA 125 improved to low of 494 in Feb 2015, then 732 just prior to cycle 9. CT AP 08-22-13 had minimal improvement, still not adequate to consider debulking surgery. She received  doxil for 6 cycles, from 09-19-13 thru 02-06-14. CT AP 02-27-14 had some early increase in central pelvic mass, with treatment changed to gemzar beginning 03-29-14. CA 125 baseline for gemzar was 1932 on 03-27-14. Cycle 4 gemzar was given 05-22-14, with CA 125 2680. CDDP was added to gemzar beginning 06-05-14. She had cycle 5 CDDP gemzar on 09-04-14. Treatment changed to tamoxifen 09-30-14 due to poor tolerance of CDDP gemzar without clinical improvement.     Review of systems as above, also: Eating peanut butter, protein bars, eggs etc but decreased appetite "in hot weather" Remainder of 10 point Review of Systems negative.  Objective:  Vital signs in last 24 hours: weight down 1.5 lbs from 6-27 to 123.9 today. 131/65, 82 regular, 18 not labored RA, 98.7  Looks more pale than labs indicate, not icteric.  Alert, oriented and appropriate. Ambulatory without assistance. Sits positioned off of left buttocks Partial alopecia  HEENT:PERRL, sclerae not icteric. Oral mucosa moist without lesions, posterior pharynx clear.  Neck supple. No JVD.  Lymphatics:no cervical,supraclavicular adenopathy Resp: clear to auscultation bilaterally and normal percussion bilaterally Cardio: regular rate and rhythm. No gallop. GI: soft, nontender,scaphoid,  not distended, no mass or organomegaly. Few bowel sounds. Ostomy functioning, site not remarkable Perirectal area with soft bullous skin area ~ 3 x 4 cm inner left buttocks, no erythema, no rash Musculoskeletal/ Extremities: 1-2+ swelling not tight right lower leg, otherwise without pitting edema, cords, tenderness Neuro: peripheral neuropathy unchanged Skin without rash, ecchymosis, petechiae Portacath-without erythema or tenderness, functioned without difficulty today.  Lab Results:  CBC today with WBC 6.8, ANC 5.3, Hgb 12.1 and plt 263k  CMET  today with normal electrolytes and LFTs, creat 0.8, total protein 6.3 and alb 3.2   CA 125 on 11-18-14 was 3202,  previously (691 on 09-25-14 not clear if this was accurate), 1234 on 08-14-14 and 2400 on 07-01-14.   Studies/Results:  No results found. CT AP ordered  Medications: I have reviewed the patient's current medications. Increase MSContin to 30 mg bid, using 15 mg tablets that she has available for now and script written for 30 mg strength; refill done for MSIR 15 mg.  DISCUSSION: with increasing local discomfort in pelvis/ perirectal area will repeat scans this week prior to visit as scheduled with this MD on 12-16-14. Continue tamoxifen for now, as she has been on this for just 10 weeks. May need to ask Dr Sondra Come to see again if localized involvment. Increase MS Contin now as noted.  Patient is tearful as she is concerned "cancer will get into my spine" - discussed.   If resume systemic chemo, would consider alimta if able to get approval, or oral etoposide.  Assessment/Plan:  1.advanced serous gyn carcinoma: unable to tolerate recent chemotherapy attempts, no problems with tamoxifen for past 10 weeks however increasing pain in low pelvic area and increasing marker. CT AP this week and keep appointment as planned on 7-25 to discuss scans and plan going forward.  2.Right hydronephrosis with JJ stent: symptoms much better with myrbetrig and the MS Contin. Dr Diona Fanti following.  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 Improved off of chemo now. Iron low normal on 09-04-14, continue oral iron as tolerated 9.bullous area inner buttocks: looks like old pressure sore. She is positioning off of this now. 10. Progressive weight loss: discussed diet again, she and son very aware. 11. She does not have Advance Directives per EMR documentation.  12. Lymphedema RLE stable  All  questions answered, patient and son in agreement with plans as above. She knows to call if other concerns before follow up visit next week. TIme spent 25 min including >50% counseling and coordination of care.    Kirsten Schroeder P, MD   12/09/2014, 8:47 AM

## 2014-12-09 NOTE — Patient Instructions (Signed)

## 2014-12-09 NOTE — Patient Instructions (Signed)
Increase present long lasting MSContin to 30 mg twice daily (= 2 of the 15 mg tablets twice daily) .  The new prescription will be for the 30 mg strength, so just one tablet every 12 hours from the new bottle

## 2014-12-10 ENCOUNTER — Other Ambulatory Visit: Payer: Self-pay | Admitting: Oncology

## 2014-12-10 ENCOUNTER — Encounter: Payer: Self-pay | Admitting: Oncology

## 2014-12-10 DIAGNOSIS — R634 Abnormal weight loss: Secondary | ICD-10-CM | POA: Insufficient documentation

## 2014-12-10 NOTE — Progress Notes (Signed)
Medical Oncology  Disability paperwork done by MD, to be given to [facility] financial staff to complete and send back.  Godfrey Pick, MD

## 2014-12-11 ENCOUNTER — Other Ambulatory Visit: Payer: Self-pay | Admitting: Oncology

## 2014-12-11 ENCOUNTER — Telehealth: Payer: Self-pay | Admitting: Oncology

## 2014-12-11 NOTE — Telephone Encounter (Signed)
Left message to confirm labs canceled for 07/25 per pof.

## 2014-12-13 ENCOUNTER — Ambulatory Visit (HOSPITAL_COMMUNITY)
Admission: RE | Admit: 2014-12-13 | Discharge: 2014-12-13 | Disposition: A | Discharge status: Home / Self Care | Payer: BC Managed Care – PPO | Source: Ambulatory Visit | Attending: Oncology | Admitting: Oncology

## 2014-12-13 ENCOUNTER — Encounter (HOSPITAL_COMMUNITY): Payer: Self-pay

## 2014-12-13 ENCOUNTER — Encounter: Payer: Self-pay | Admitting: Oncology

## 2014-12-13 DIAGNOSIS — C569 Malignant neoplasm of unspecified ovary: Secondary | ICD-10-CM | POA: Insufficient documentation

## 2014-12-13 DIAGNOSIS — C772 Secondary and unspecified malignant neoplasm of intra-abdominal lymph nodes: Secondary | ICD-10-CM

## 2014-12-13 MED ORDER — IOHEXOL 300 MG/ML  SOLN
50.0000 mL | Freq: Once | INTRAMUSCULAR | Status: AC | PRN
  Administered 2014-12-13: 50 mL via ORAL

## 2014-12-13 MED ORDER — IOHEXOL 300 MG/ML  SOLN
100.0000 mL | Freq: Once | INTRAMUSCULAR | Status: AC | PRN
  Administered 2014-12-13: 100 mL via INTRAVENOUS

## 2014-12-13 NOTE — Progress Notes (Signed)
I called and left a message at [phone] for patient to call me back. I need date she became disabled in order to send her forms

## 2014-12-16 ENCOUNTER — Telehealth: Payer: Self-pay | Admitting: Oncology

## 2014-12-16 ENCOUNTER — Ambulatory Visit (HOSPITAL_BASED_OUTPATIENT_CLINIC_OR_DEPARTMENT_OTHER): Payer: BC Managed Care – PPO | Admitting: Oncology

## 2014-12-16 ENCOUNTER — Encounter: Payer: Self-pay | Admitting: Oncology

## 2014-12-16 ENCOUNTER — Other Ambulatory Visit: Payer: BC Managed Care – PPO

## 2014-12-16 VITALS — BP 116/72 | HR 100 | Temp 98.7°F | Resp 18 | Ht 61.0 in | Wt 122.2 lb

## 2014-12-16 DIAGNOSIS — R634 Abnormal weight loss: Secondary | ICD-10-CM

## 2014-12-16 DIAGNOSIS — D649 Anemia, unspecified: Secondary | ICD-10-CM

## 2014-12-16 DIAGNOSIS — Z95828 Presence of other vascular implants and grafts: Secondary | ICD-10-CM

## 2014-12-16 DIAGNOSIS — C801 Malignant (primary) neoplasm, unspecified: Secondary | ICD-10-CM | POA: Diagnosis not present

## 2014-12-16 DIAGNOSIS — C772 Secondary and unspecified malignant neoplasm of intra-abdominal lymph nodes: Secondary | ICD-10-CM

## 2014-12-16 DIAGNOSIS — G620 Drug-induced polyneuropathy: Secondary | ICD-10-CM

## 2014-12-16 DIAGNOSIS — G893 Neoplasm related pain (acute) (chronic): Secondary | ICD-10-CM

## 2014-12-16 DIAGNOSIS — C774 Secondary and unspecified malignant neoplasm of inguinal and lower limb lymph nodes: Secondary | ICD-10-CM | POA: Diagnosis not present

## 2014-12-16 DIAGNOSIS — C569 Malignant neoplasm of unspecified ovary: Secondary | ICD-10-CM

## 2014-12-16 DIAGNOSIS — I890 Lymphedema, not elsewhere classified: Secondary | ICD-10-CM

## 2014-12-16 DIAGNOSIS — N1330 Unspecified hydronephrosis: Secondary | ICD-10-CM

## 2014-12-16 NOTE — Telephone Encounter (Signed)
avs printed for patient with dr Sondra Come appointment

## 2014-12-16 NOTE — Progress Notes (Signed)
OFFICE PROGRESS NOTE   December 16, 2014   Physicians:ClarkePearson, Quillian Quince; Evalee Mutton, Marlene Bast, Floy Sabina, Annie Main. New patient referral made to Dr Gery Pray.   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 She is not a surgical candidate per Dr Josephina Shih.  With increased symptoms last week, MS Contin was increased from 15 to 30 mg bid, with good improvement, and CT AP repeated on 12-13-14.  Patient has been significantly more comfortable on the higher dose of MSContin, with only occasional breakthru medication needed and able to be more active at home. Ostomy is still functioning as usual despite increased pain medication and she is not sedated. She has a difficult time tolerating volume of oral contrast and could not lie flat in scanner, tho information from CT still is helpful. The pelvic mass is somewhat larger compared with 06-2014, which was shortly after she began CDDP with gemzar and prior to start of tamoxifen 09-30-14. She does not have bone involvement, ascites, worsening hydronephrosis or bulky adenopathy, and lung bases are clear.     PAC in, flushed 12-09-14 Right JJ stent No genetics testing.  Son is moving to [place], where he will be chef for [employer] Patient presented with RLE swelling negative for DVT July 2014, and right hydronephrosis. 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 She had 3 additional cycles, with progressive decrease in CA 125 from 5626 in 12-2012 to 596 by Jan 2015. CT 06-14-13 had ~ stable pelvic mass and adenopathy. Dr Josephina Shih recommended another 3 cycles  chemotherapy, regimen changed to q 3 week taxol carboplatin on 06-27-13, which unfortunately caused worsening of peripheral neuropathy and severe taxol aches.Taxotere was substituted for taxol due to neuropathy in feet for cycles 8 and 9, completed 08-08-13. CA 125 improved to low of 494 in Feb 2015, then 732 just prior to cycle 9. CT AP 08-22-13 had minimal improvement, still not adequate to consider debulking surgery. She received doxil for 6 cycles, from 09-19-13 thru 02-06-14. CT AP 02-27-14 had some early increase in central pelvic mass, with treatment changed to gemzar beginning 03-29-14. CA 125 baseline for gemzar was 1932 on 03-27-14. Cycle 4 gemzar was given 05-22-14, with CA 125 2680. CDDP was added to gemzar beginning 06-05-14. She had cycle 5 CDDP gemzar on 09-04-14. Treatment changed to tamoxifen 09-30-14 due to poor tolerance of CDDP gemzar without clinical improvement. CT AP 12-13-14 compared with 06-2014 had some increase in the pelvic mass, but no new areas of involvement.    Review of systems as above, also: No increase in chronic swelling RLE. No gross hematuria or different bladder symptoms, with right JJ stent. No bleeding. No vomiting. Is able to sleep. Still trying 5 small meals daily, has not tried supplements in 20 years (since surgery for ulcerative colitis), which she did not tolerate then but is willing to try again.  Remainder of 10 point Review of Systems negative.  Objective:  Vital signs in last 24 hours:  BP 116/72 mmHg  Pulse 100  Temp(Src) 98.7 F (37.1 C) (Oral)  Resp 18  Ht 5' 1"  (1.549 m)  Wt 122 lb 3.2 oz (55.43 kg)  BMI 23.10 kg/m2  SpO2 97%  Alert, oriented and appropriate. Ambulatory without assistance.  Alopecia  HEENT:PERRL, sclerae not icteric. Oral mucosa moist without lesions, posterior pharynx clear.  Neck supple. No JVD.  Lymphatics:no cervical,supraclavicular adenopathy Resp: clear to auscultation bilaterally and normal percussion bilaterally Cardio:  regular rate and rhythm. No gallop. GI: soft, nontender, not distended, no organomegaly. Some bowel sounds. Ostomy not remarkable Musculoskeletal/ Extremities: 2+ swelling not quite tight RLE as baseline, otherwise without pitting edema, cords, tenderness Neuro: no change peripheral neuropathy. Otherwise nonfocal. PSYCH appropriate mood and affect Skin without rash, ecchymosis, petechiae Portacath-without erythema or tenderness  Lab Results:  Results for orders placed or performed in visit on 12/09/14  CBC with Differential  Result Value Ref Range   WBC 6.8 3.9 - 10.3 10e3/uL   NEUT# 5.3 1.5 - 6.5 10e3/uL   HGB 12.1 11.6 - 15.9 g/dL   HCT 37.0 34.8 - 46.6 %   Platelets 263 145 - 400 10e3/uL   MCV 91.0 79.5 - 101.0 fL   MCH 29.8 25.1 - 34.0 pg   MCHC 32.7 31.5 - 36.0 g/dL   RBC 4.06 3.70 - 5.45 10e6/uL   RDW 14.1 11.2 - 14.5 %   lymph# 0.9 0.9 - 3.3 10e3/uL   MONO# 0.5 0.1 - 0.9 10e3/uL   Eosinophils Absolute 0.1 0.0 - 0.5 10e3/uL   Basophils Absolute 0.1 0.0 - 0.1 10e3/uL   NEUT% 77.7 (H) 38.4 - 76.8 %   LYMPH% 12.6 (L) 14.0 - 49.7 %   MONO% 7.9 0.0 - 14.0 %   EOS% 0.9 0.0 - 7.0 %   BASO% 0.9 0.0 - 2.0 %  Comprehensive metabolic panel (Cmet) - [facility]  Result Value Ref Range   Sodium 137 136 - 145 mEq/L   Potassium 4.0 3.5 - 5.1 mEq/L   Chloride 103 98 - 109 mEq/L   CO2 29 22 - 29 mEq/L   Glucose 108 70 - 140 mg/dl   BUN 8.4 7.0 - 26.0 mg/dL   Creatinine 0.8 0.6 - 1.1 mg/dL   Total Bilirubin 0.27 0.20 - 1.20 mg/dL   Alkaline Phosphatase 63 40 - 150 U/L   AST 19 5 - 34 U/L   ALT 13 0 - 55 U/L   Total Protein 6.3 (L) 6.4 - 8.3 g/dL   Albumin 3.2 (L) 3.5 - 5.0 g/dL   Calcium 9.3 8.4 - 10.4 mg/dL   Anion Gap 5 3 - 11 mEq/L   EGFR 84 (L) >90 ml/min/1.73 m2    CA 125 on 11-18-14 was 3202, having been 691 on 09-25-14  Studies/Results: EXAM: CT ABDOMEN AND PELVIS WITH CONTRAST  TECHNIQUE: Multidetector CT imaging of the abdomen and pelvis was performed using the standard  protocol following bolus administration of intravenous contrast.  CONTRAST: 35m OMNIPAQUE IOHEXOL 300 MG/ML SOLN, 1072mOMNIPAQUE IOHEXOL 300 MG/ML SOLN  COMPARISON: 07/19/2014  FINDINGS: Lower chest: No pleural or pericardial effusion. The lung bases are clear.  Hepatobiliary: No suspicious liver abnormality. Previous cholecystectomy. Similar appearance of mild increase caliber of the CBD.  Pancreas: Negative  Spleen: Negative  Adrenals/Urinary Tract: The adrenal glands are both normal. Normal appearance of the left kidney. Persistent right-sided obstructive uropathy. The right renal pelvis measures 2.9 cm, image 31/ series 2. Previously this measured the same. The right-sided nephro ureteral stent is in place. Thick walled urinary bladder appears collapsed.  Stomach/Bowel: Stomach appears normal. The small bowel loops have a normal caliber. No obstruction. 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 There is a  large parastomal hernia which contains nonobstructed loops of small bowel.  Vascular/Lymphatic: Normal appearance of the abdominal aorta. Aortocaval lymph node measures 7 mm, image 29/series 2. Previously 4 mm. Adjacent lymph node stress set adjacent precaval lymph node measure 8 mm, image 34/series 2. Previously this measured 5 mm. No iliac or inguinal adenopathy.  Reproductive: Heterogeneous and enhancing pelvic mass contiguous with the urinary bladder is again noted. This measures 7.4 x 7.0 by 9.8 cm. Previously 5.8 x 6.7 7.7 cm.  Other: No ascites. No peritoneal nodule or mass.  Musculoskeletal: No aggressive lytic or sclerotic bone lesions. Degenerative disc disease is noted within the lumbar spine.  IMPRESSION: 1. The dominant pelvic mass has decreased in size when compared with previous exam. 2. Small retroperitoneal lymph nodes are also increased in the interval. 3. Persistent right-sided obstructive uropathy  with double-J nephro ureteral stent in place  PACs images reviewed with patient and daughter now  Medications: I have reviewed the patient's current medications. Continue MSContin at 30 mg bid.Continue tamoxifen until we know if radiation is an option.  DISCUSSION: CT information reviewed in context of treatment course. I have suggested that we ask Dr Sondra Come to see her in consultation, for possible palliative radiation to the pelvic involvement. Note she has had proctocolectomy with ileostomy for the ulcerative colitis, so bowel complications from radiation hopefully would not be an issue. If radiation is not appropriate, then we may need to change systemic treatment, tho overall she has felt much better off of chemotherapy for last 2 months.  Try supplements with diet as she continues to lose weight, appetite not great. I am reluctant to add megace with DVT risk. I will see her back shortly after she meets with Dr Sondra Come, and she knows to call prior if needed.  Assessment/Plan: 1.advanced serous gyn carcinoma: poorly tolerant of recent chemotherapy attempts, no problems with tamoxifen since early May but may not be controlling disease adequately. Pain much better with increase in MSContin. CT findings as noted. Appreciate Dr Sondra Come seeing her in consultation. 2.Right hydronephrosis with JJ stent: symptoms much better with myrbetrig and the MS Contin. Dr Diona Fanti following.  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 Much improved off of chemo now. Iron low normal on 09-04-14, continue oral iron as tolerated 9.bullous area inner buttocks: looks like old pressure sore. She is positioning off of this now. 10. Progressive weight loss: discussed diet again, try  supplements.  11. She does not have Advance Directives per EMR documentation.  12. Lymphedema RLE stable   All questions answered. Time spent 25 min including >50% counseling and coordination of care. Cc Drs Sondra Come, Dahlstedt and ClarkePearson    Gordy Levan, MD   12/16/2014, 2:32 PM

## 2014-12-17 ENCOUNTER — Other Ambulatory Visit: Payer: Self-pay | Admitting: Oncology

## 2014-12-17 ENCOUNTER — Encounter: Payer: Self-pay | Admitting: Oncology

## 2014-12-17 DIAGNOSIS — C569 Malignant neoplasm of unspecified ovary: Secondary | ICD-10-CM

## 2014-12-17 DIAGNOSIS — C772 Secondary and unspecified malignant neoplasm of intra-abdominal lymph nodes: Secondary | ICD-10-CM

## 2014-12-17 NOTE — Progress Notes (Signed)
I mailed forms to patient and sent her a copy for her records.

## 2014-12-25 NOTE — Progress Notes (Signed)
GYN Location of Tumor / Histology: advanced serous gyn carcinoma - to be seen for possible palliative radiation to pelvic involvement  Deno Etienne presented per Dr. Marko Plume, "Patient presented with RLE swelling negative for DVT July 2014, and right hydronephrosis. 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 Fermin Schwab.  Past/Anticipated interventions by medical oncology, if any: taking tamoxifen since 09/30/14. Neoadjuvant dose dense taxol carboplatin began 01-31-13, with improvement in pelvic mass and adenopathy by scans after 3 cycles.. She had 3 additional cycles, with progressive decrease in CA 125 from 5626 in 12-2012 to 596 by Jan 2015. regimen changed to q 3 week taxol carboplatin on 06-27-13, which unfortunately caused worsening of peripheral neuropathy and severe taxol aches.Taxotere was substituted for taxol due to neuropathy in feet for cycles 8 and 9, completed 08-08-13.  She received doxil for 6 cycles, from 09-19-13 thru 02-06-14. Cycle 4 gemzar was given 05-22-14, with CA 125 2680. CDDP was added to gemzar beginning 06-05-14. She had cycle 5 CDDP gemzar on 09-04-14.  She is now taking Tamoxifen.  Weight changes, if any: has lsot about 10 lbs since April.  Reports a poor appetite.  She is eating protein bars.  Bowel/Bladder complaints, if any: patient has a colostomy.  She denies having diarrhea.  She has  urinary urgency.  She denies vaginal/rectal bleeding  Nausea/Vomiting, if any: no  Pain issues, if any:  Yes - has pain at the end of her spine where the tumor is.  She takes Morphine 30 mg bid and Morphine 15 mg q 4 hours for breakthrough pain.  She has only had to take the 15 mg about once a day.  She reports that she is unable to lay on her back.    SAFETY ISSUES:  Prior radiation? no  Pacemaker/ICD? no  Possible current pregnancy? no  Is the patient on methotrexate? no  Current Complaints / other details:  Patient is here with her daughter.  She has 2 children.  BP 114/60 mmHg  Pulse 92  Temp(Src) 98.4 F (36.9 C) (Oral)  Resp 12  Ht 5\' 2"  (1.575 m)  Wt 121 lb 6.4 oz (55.067 kg)  BMI 22.20 kg/m2  SpO2 99%   Wt Readings from Last 3 Encounters:  01/01/15 121 lb 6.4 oz (55.067 kg)  12/16/14 122 lb 3.2 oz (55.43 kg)  12/09/14 123 lb 9.6 oz (56.065 kg)

## 2015-01-01 ENCOUNTER — Ambulatory Visit
Admission: RE | Admit: 2015-01-01 | Discharge: 2015-01-01 | Disposition: A | Discharge status: Home / Self Care | Payer: BC Managed Care – PPO | Source: Ambulatory Visit | Attending: Radiation Oncology | Admitting: Radiation Oncology

## 2015-01-01 ENCOUNTER — Encounter: Payer: Self-pay | Admitting: Radiation Oncology

## 2015-01-01 VITALS — BP 114/60 | HR 92 | Temp 98.4°F | Resp 12 | Ht 62.0 in | Wt 121.4 lb

## 2015-01-01 DIAGNOSIS — Z833 Family history of diabetes mellitus: Secondary | ICD-10-CM | POA: Insufficient documentation

## 2015-01-01 DIAGNOSIS — Z8042 Family history of malignant neoplasm of prostate: Secondary | ICD-10-CM | POA: Diagnosis not present

## 2015-01-01 DIAGNOSIS — Z510 Encounter for antineoplastic radiation therapy: Secondary | ICD-10-CM | POA: Insufficient documentation

## 2015-01-01 DIAGNOSIS — C772 Secondary and unspecified malignant neoplasm of intra-abdominal lymph nodes: Secondary | ICD-10-CM

## 2015-01-01 DIAGNOSIS — Z8249 Family history of ischemic heart disease and other diseases of the circulatory system: Secondary | ICD-10-CM | POA: Insufficient documentation

## 2015-01-01 DIAGNOSIS — Z801 Family history of malignant neoplasm of trachea, bronchus and lung: Secondary | ICD-10-CM | POA: Insufficient documentation

## 2015-01-01 DIAGNOSIS — C569 Malignant neoplasm of unspecified ovary: Secondary | ICD-10-CM | POA: Diagnosis present

## 2015-01-01 NOTE — Progress Notes (Signed)
Please see the Nurse Progress Note in the MD Initial Consult Encounter for this patient. 

## 2015-01-01 NOTE — Progress Notes (Signed)
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 female who presented per Dr. Marko Plume. "Patient presented with RLE swelling, negative for DVT July 2016, and right hydronephrosis. Biopsy of left inguinal node ([id number]) found carcinoma consistent with serous gyn primary.  CT scan at that time showed a pelvic mass. She was seen by gynecologic oncology and was not felt to be a candidate at any point for surgical debulking . CT scan on 12/13/14 shows that the dominant pelvic mass has increased in size, despite ongoing tamoxifen and prior chemotherapy. Patient is been having increasing pain in the pelvis area interfering with sitting and sleeping.   Patient is now referred to radiation oncology for consideration for palliative treatment.    PREVIOUS RADIATION THERAPY: No  Oncology History   Serous carcinoma consistent with gyn primary from biopsy of inguinal lymph node 01-15-13. Plan neoadjuvant chemotherapy prior to any surgical staging.  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 She had been generally stable until contracting norovirus in May 2014, with prolonged illness and weight loss then. She had yearly PE and first gyn exam by Dr Leo Grosser in June 2014. In July 2014 she developed RLE swelling and pain, with venous dopplers negative. She developed nausea, vomiting and fever, with abdominal US 01-10-13 which showed mild right hydronephrosis. She was admitted to [facility] from 8-20 thru 01-17-13; with evaluation including CT AP x2; consultations by Drs Diona Fanti,  Benay Spice, Cornett and ClarkePearson;right ureteral stent placement; and US biopsy of left inguinal lymph node. Pathology [phone] carcinoma with immunohistochemical stains consistent with serous carcinoma of gyn origin. Dr Josephina Shih recommended dose dense taxol carboplatin, with possible interval debulking depending on response after ~ 3 cycles. Cycle 1 day 1 dose dense taxol carboplatin was given on 01-31-13; day 15 cycle 1 was held with platelets 49k. Carboplatin dose was decreased to AUC=4 with cycle 2 on 02-21-13; day 15 cycle 2 was held due to symptomatic anemia, transfusion 2 units PRBCs on 03-07-13. Day 1 cycle 3 was given 03-14-13, day 8 on 10-29 + neupogen, and day 15 held 11-5 for platelets 51k. CT AP 04-10-13 showed improvement in pelvic mass and adenopathy, with recommendation for 3 additional cycles of chemotherapy before possible debulking surgery. Day 15 cycle 4 was held on 04-18-13 due to hemoglobin 7.8, with PRBC transfusion given.  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    PAST SURGICAL HISTORY: Past Surgical History  Procedure Laterality Date  . Cystoscopy w/ ureteral stent placement Right 01/12/2013    Procedure: CYSTOSCOPY WITH RETROGRADE PYELOGRAM/ RIGHT DOUBLE J STENT PLACEMENT;  Surgeon: Franchot Gallo, MD;  Location: [facility];  Service: Urology;  Laterality: Right;  . Cesarean section    . Cholecystectomy  1992  . Total colectomy w/ permanent ileostomy  1990  (2 PART SURGERY)    SEVERE UC  . Cystoscopy w/ ureteral stent placement Right 09/13/2013    Procedure: CYSTOSCOPY WITH STENT REPLACEMENT;  Surgeon: Franchot Gallo, MD;  Location: [facility];  Service: Urology;  Laterality: Right;  . Cystoscopy w/ ureteral stent placement Right 07/22/2014    Procedure: CYSTOSCOPY WITH STENT REPLACEMENT;  Surgeon: Jorja Loa, MD;  Location: [facility];  Service: Urology;  Laterality: Right;    FAMILY HISTORY: family history includes Arthritis in her mother; Diabetes in her brother and father; Hypertension in her brother, father, and mother; Lung cancer in her father; Prostate cancer in her paternal grandfather; Skin cancer in her sister.  SOCIAL HISTORY:  reports that she has never smoked. She has never used smokeless tobacco. She reports that she does not drink alcohol or use illicit drugs. lives with her daughter here in [place]. She is not presently driving in light of her narcotics.  ALLERGIES: Penicillins  MEDICATIONS:  Current Outpatient Prescriptions  Medication Sig Dispense Refill  . Calcium Carbonate-Vitamin D (CALCIUM + D PO) Take 1 tablet by mouth daily.     . Fe Fum-FA-B Cmp-C-Zn-Mg-Mn-Cu (FERROCITE PLUS) 106-1 MG TABS Take 1 tablet by mouth daily. 30 each 5  .  lidocaine-prilocaine (EMLA) cream Apply topically as needed. Apply 1-2 hours prior to Porta-cath access. 30 g 2  . mirabegron ER (MYRBETRIQ) 25 MG TB24 tablet Take 25 mg by mouth daily.    Marland Kitchen morphine (MS CONTIN) 30 MG 12 hr tablet Take 1 tablet (30 mg total) by mouth every 12 (twelve) hours. 60 tablet 0  . morphine (MSIR) 15 MG tablet Take 0.5-1 tablets (7.5-15 mg total) by mouth every 4 (four) hours as needed for severe pain. 30 tablet 0  . Multiple Vitamin (MULTIVITAMIN WITH MINERALS) TABS tablet Take 1 tablet by mouth daily.    Marland Kitchen oxybutynin (DITROPAN) 5 MG tablet Take 1 tablet (5 mg total) by mouth 3 (three) times daily. 90 tablet 3  . tamoxifen (NOLVADEX) 20 MG tablet Take 1 tablet (20 mg total) by mouth daily. Begin medication about 10-07-14. 30 tablet 3  . acyclovir (ZOVIRAX) 200 MG capsule Take 1 capsule (200 mg total) by mouth 3 (three) times daily as needed. For fever blister (Patient not taking: Reported on 12/16/2014) 30 capsule 1  . diazepam (VALIUM) 2 MG tablet Take 1 mg by mouth daily as needed for anxiety.     Marland Kitchen LORazepam (ATIVAN) 0.5 MG tablet Take 1 tablet (0.5 mg total) by mouth every 6 (six) hours as needed (for nausea). (Patient not taking: Reported on 11/18/2014) 30 tablet 0  . ondansetron (ZOFRAN) 8 MG tablet  Take 1-2 tablets (8-16 mg total) by mouth every 12 (twelve) hours as needed for nausea. (Patient not taking: Reported on 12/09/2014) 30 tablet 2  . sodium chloride (OCEAN) 0.65 % nasal spray Place 1 spray into the nose as needed. Use every 1-3 hours while awake to keep nose moist, which helps to prevent nosebleeds     No current facility-administered medications for this encounter.   Facility-Administered Medications Ordered in Other Encounters  Medication Dose Route Frequency Provider Last Rate Last Dose  . sodium chloride 0.9 % injection 10 mL  10 mL Intravenous PRN Lennis Marion Downer, MD   10 mL at 02/19/13 0835    REVIEW OF SYSTEMS:  A 15 point review of systems is  documented in the electronic medical record. This was obtained by the nursing staff. However, I reviewed this with the patient to discuss relevant findings and make appropriate changes.  Pertinent items are noted in HPI. she is having significant pain in the pelvis area. She is unable to sit comfortably in light of this issue and has difficulty sleeping at night in light of her pain she denies any vaginal bleeding.   PHYSICAL EXAM:  height is 5\' 2"  (1.575 m) and weight is 121 lb 6.4 oz (55.067 kg). Her oral temperature is 98.4 F (36.9 C). Her blood pressure is 114/60 and her pulse is 92. Her respiration is 12 and oxygen saturation is 99%.   _ General: Well-developed, in no acute distress, sits off to her right side in light of her pelvic pain , accompanied by daughter on evaluation today HEENT: Normocephalic, atraumatic Cardiovascular: Regular rate and rhythm Respiratory: Clear to auscultation bilaterally GI: Soft, nontender, normal bowel sounds Extremities: No edema present No palpable cervical, supraclavicular or axillary lymphoadenopathy Dentures in place, no mucosal lesions Patient has ileostomy in right lower quadrant No inguinal adenopathy Scars along abdomen from prior surgery RLE significantly swollen, pitting edema   ECOG = 2  LABORATORY DATA:  Lab Results  Component Value Date   WBC 6.8 12/09/2014   HGB 12.1 12/09/2014   HCT 37.0 12/09/2014   MCV 91.0 12/09/2014   PLT 263 12/09/2014   NEUTROABS 5.3 12/09/2014   Lab Results  Component Value Date   NA 137 12/09/2014   K 4.0 12/09/2014   CL 103 01/17/2013   CO2 29 12/09/2014   GLUCOSE 108 12/09/2014   CREATININE 0.8 12/09/2014   CALCIUM 9.3 12/09/2014      RADIOGRAPHY: Ct Abdomen Pelvis W Contrast  12/13/2014   CLINICAL DATA:  Metastatic ovarian cancer.  Chemotherapy complete.  EXAM: CT ABDOMEN AND PELVIS WITH CONTRAST  TECHNIQUE: Multidetector CT imaging of the abdomen and pelvis was performed using the standard  protocol following bolus administration of intravenous contrast.  CONTRAST:  60mL OMNIPAQUE IOHEXOL 300 MG/ML SOLN, 142mL OMNIPAQUE IOHEXOL 300 MG/ML SOLN  COMPARISON:  07/19/2014  FINDINGS: Lower chest: No pleural or pericardial effusion. The lung bases are clear.  Hepatobiliary: No suspicious liver abnormality. Previous cholecystectomy. Similar appearance of mild increase caliber of the CBD.  Pancreas: Negative  Spleen: Negative  Adrenals/Urinary Tract: The adrenal glands are both normal. Normal appearance of the left kidney. Persistent right-sided obstructive uropathy. The right renal pelvis measures 2.9 cm, image 31/ series 2. Previously this measured the same. The right-sided nephro ureteral stent is in place. Thick walled urinary bladder appears collapsed.  Stomach/Bowel: Stomach appears normal. The small bowel loops have a normal caliber. No obstruction. There is a right lower quadrant ileostomy status  post colectomy. There is a large parastomal hernia which contains nonobstructed loops of small bowel.  Vascular/Lymphatic: Normal appearance of the abdominal aorta. Aortocaval lymph node measures 7 mm, image 29/series 2. Previously 4 mm. Adjacent lymph node stress set adjacent precaval lymph node measure 8 mm, image 34/series 2. Previously this measured 5 mm. No iliac or inguinal adenopathy.  Reproductive: Heterogeneous and enhancing pelvic mass contiguous with the urinary bladder is again noted. This measures 7.4 x 7.0 by 9.8 cm. Previously 5.8 x 6.7 7.7 cm.  Other: No ascites.  No peritoneal nodule or mass.  Musculoskeletal: No aggressive lytic or sclerotic bone lesions. Degenerative disc disease is noted within the lumbar spine.  IMPRESSION: 1. The dominant pelvic mass has decreased in size when compared with previous exam. 2. Small retroperitoneal lymph nodes are also increased in the interval. 3. Persistent right-sided obstructive uropathy with double-J nephro ureteral stent in place.   Electronically  Signed   By: Kerby Moors M.D.   On: 12/13/2014 14:27      IMPRESSION: Kirsten Schroeder is a 59 y.o. female who presents with a pelvic mass with pain and urinary symptoms from hydronephrosis. At this time, the patient would benefit from palliative radiation treatment to the pelvic mass.  Discussed treatment course side effects and potential toxicities of radiation therapy in this situation with the patient. She appears to understand and wishes to proceed with planning course of treatments.  PLAN:  Simulation and planning on August 16 with treatments to begin soon afterwards. Anticipate approximately 5 weeks of radiation therapy as part of her overall management.       ------------------------------------------------  Blair Promise, PhD, MD   This document serves as a record of services personally performed by Gery Pray, MD. It was created on his behalf by Derek Mound, a trained medical scribe. The creation of this record is based on the scribe's personal observations and the provider's statements to them. This document has been checked and approved by the attending provider.

## 2015-01-01 NOTE — Addendum Note (Signed)
Encounter addended by: Jacqulyn Liner, RN on: 01/01/2015  4:04 PM<BR>     Documentation filed: Charges VN

## 2015-01-05 ENCOUNTER — Other Ambulatory Visit: Payer: Self-pay | Admitting: Oncology

## 2015-01-06 ENCOUNTER — Other Ambulatory Visit (HOSPITAL_BASED_OUTPATIENT_CLINIC_OR_DEPARTMENT_OTHER): Payer: BC Managed Care – PPO

## 2015-01-06 ENCOUNTER — Encounter: Payer: Self-pay | Admitting: Oncology

## 2015-01-06 ENCOUNTER — Ambulatory Visit (HOSPITAL_BASED_OUTPATIENT_CLINIC_OR_DEPARTMENT_OTHER): Payer: BC Managed Care – PPO

## 2015-01-06 ENCOUNTER — Ambulatory Visit (HOSPITAL_BASED_OUTPATIENT_CLINIC_OR_DEPARTMENT_OTHER): Payer: BC Managed Care – PPO | Admitting: Oncology

## 2015-01-06 VITALS — BP 125/72 | HR 87 | Temp 98.2°F | Resp 18 | Ht 62.0 in | Wt 123.0 lb

## 2015-01-06 DIAGNOSIS — R634 Abnormal weight loss: Secondary | ICD-10-CM

## 2015-01-06 DIAGNOSIS — G620 Drug-induced polyneuropathy: Secondary | ICD-10-CM | POA: Diagnosis not present

## 2015-01-06 DIAGNOSIS — N1330 Unspecified hydronephrosis: Secondary | ICD-10-CM

## 2015-01-06 DIAGNOSIS — C801 Malignant (primary) neoplasm, unspecified: Secondary | ICD-10-CM

## 2015-01-06 DIAGNOSIS — C772 Secondary and unspecified malignant neoplasm of intra-abdominal lymph nodes: Secondary | ICD-10-CM

## 2015-01-06 DIAGNOSIS — C774 Secondary and unspecified malignant neoplasm of inguinal and lower limb lymph nodes: Secondary | ICD-10-CM

## 2015-01-06 DIAGNOSIS — G893 Neoplasm related pain (acute) (chronic): Secondary | ICD-10-CM

## 2015-01-06 DIAGNOSIS — I890 Lymphedema, not elsewhere classified: Secondary | ICD-10-CM

## 2015-01-06 DIAGNOSIS — C569 Malignant neoplasm of unspecified ovary: Secondary | ICD-10-CM

## 2015-01-06 DIAGNOSIS — D649 Anemia, unspecified: Secondary | ICD-10-CM

## 2015-01-06 DIAGNOSIS — Z95828 Presence of other vascular implants and grafts: Secondary | ICD-10-CM

## 2015-01-06 DIAGNOSIS — R300 Dysuria: Secondary | ICD-10-CM

## 2015-01-06 LAB — CBC WITH DIFFERENTIAL/PLATELET
BASO%: 1 % (ref 0.0–2.0)
Basophils Absolute: 0.1 10*3/uL (ref 0.0–0.1)
EOS%: 1.6 % (ref 0.0–7.0)
Eosinophils Absolute: 0.1 10*3/uL (ref 0.0–0.5)
HCT: 35.2 % (ref 34.8–46.6)
HGB: 11.7 g/dL (ref 11.6–15.9)
LYMPH#: 0.9 10*3/uL (ref 0.9–3.3)
LYMPH%: 12.1 % — ABNORMAL LOW (ref 14.0–49.7)
MCH: 29.4 pg (ref 25.1–34.0)
MCHC: 33.2 g/dL (ref 31.5–36.0)
MCV: 88.7 fL (ref 79.5–101.0)
MONO#: 0.7 10*3/uL (ref 0.1–0.9)
MONO%: 9.2 % (ref 0.0–14.0)
NEUT%: 76.1 % (ref 38.4–76.8)
NEUTROS ABS: 5.5 10*3/uL (ref 1.5–6.5)
Platelets: 250 10*3/uL (ref 145–400)
RBC: 3.97 10*6/uL (ref 3.70–5.45)
RDW: 14.8 % — ABNORMAL HIGH (ref 11.2–14.5)
WBC: 7.3 10*3/uL (ref 3.9–10.3)

## 2015-01-06 LAB — COMPREHENSIVE METABOLIC PANEL (CC13)
ALBUMIN: 3.2 g/dL — AB (ref 3.5–5.0)
ALT: 13 U/L (ref 0–55)
AST: 19 U/L (ref 5–34)
Alkaline Phosphatase: 70 U/L (ref 40–150)
Anion Gap: 10 mEq/L (ref 3–11)
BUN: 11 mg/dL (ref 7.0–26.0)
CHLORIDE: 102 meq/L (ref 98–109)
CO2: 26 mEq/L (ref 22–29)
CREATININE: 0.9 mg/dL (ref 0.6–1.1)
Calcium: 9.1 mg/dL (ref 8.4–10.4)
EGFR: 69 mL/min/{1.73_m2} — ABNORMAL LOW (ref >90)
Glucose: 113 mg/dl (ref 70–140)
Potassium: 4 mEq/L (ref 3.5–5.1)
Sodium: 138 mEq/L (ref 136–145)
Total Bilirubin: 0.28 mg/dL (ref 0.20–1.20)
Total Protein: 6.3 g/dL — ABNORMAL LOW (ref 6.4–8.3)

## 2015-01-06 MED ORDER — OXYBUTYNIN CHLORIDE 5 MG PO TABS: 5.0000 mg | ORAL_TABLET | Freq: Three times a day (TID) | ORAL | Status: DC

## 2015-01-06 MED ORDER — MORPHINE SULFATE 15 MG PO TABS: 7.5000 mg | ORAL_TABLET | ORAL | Status: DC | PRN

## 2015-01-06 MED ORDER — SODIUM CHLORIDE 0.9 % IJ SOLN
10.0000 mL | INTRAMUSCULAR | Status: DC | PRN
  Administered 2015-01-06: 10 mL via INTRAVENOUS
  Filled 2015-01-06: qty 10

## 2015-01-06 MED ORDER — HEPARIN SOD (PORK) LOCK FLUSH 100 UNIT/ML IV SOLN
500.0000 [IU] | Freq: Once | INTRAVENOUS | Status: AC
  Administered 2015-01-06: 500 [IU] via INTRAVENOUS
  Filled 2015-01-06: qty 5

## 2015-01-06 MED ORDER — MORPHINE SULFATE ER 30 MG PO TBCR: 30.0000 mg | EXTENDED_RELEASE_TABLET | Freq: Two times a day (BID) | ORAL | Status: DC

## 2015-01-06 NOTE — Progress Notes (Signed)
OFFICE PROGRESS NOTE   January 06, 2015   Physicians:ClarkePearson, Quillian Quince; Evalee Mutton, Marlene Bast, Floy Sabina, Annie Main. New patient referral made to Dr Gery Pray.   INTERVAL HISTORY:   Patient is seen in follow up of advanced serous gyn carcinoma with symptomatic progression in pelvis. She is to begin radiation by Dr Sondra Come, with CT simulation 01-07-15 and treatment anticipated x 5 weeks. She continues tamoxifen, tho this has not improved situation since it was begun early May.   Pain control is adequate with MS Contin 30 mg q 12 hrs and prn MSIR, which she has not needed to increase in last couple of weeks. Bowels are moving regularly via ileostomy. She has tried to increase po intake, including more high calorie foods, and has gained about 2 lbs. She denies bleeding, fever, different symptoms from bladder or stent, SOB.   PAC in, flushed today Right JJ stent No genetics testing. CA 125 at diagnosis [place] Patient presented with RLE swelling negative for DVT July 2014, and right hydronephrosis. Biopsy of left inguinal node ([id number]) found carcinoma consistent with serous gyn primary.Neoadjuvant dose dense taxol carboplatin began 01-31-13, with improvement in pelvic mass and adenopathy by scans after 3 cycles.. She had 3 additional cycles, with progressive decrease in CA 125 from 5626 in 12-2012 to 596 by Jan 2015. CT 06-14-13 had ~ stable pelvic mass and adenopathy. Dr Josephina Shih recommended another 3 cycles chemotherapy, regimen changed to q 3 week taxol carboplatin on 06-27-13, which unfortunately caused worsening of peripheral neuropathy and severe taxol aches.Taxotere was substituted for taxol due to neuropathy in feet for cycles 8 and 9, completed 08-08-13. CA 125 improved to low of 494 in Feb 2015, then 732 just prior to cycle 9. CT AP 08-22-13 had minimal improvement, still not adequate to consider debulking surgery. She received doxil for 6  cycles, from 09-19-13 thru 02-06-14. CT AP 02-27-14 had some early increase in central pelvic mass, with treatment changed to gemzar beginning 03-29-14. CA 125 baseline for gemzar was 1932 on 03-27-14. Cycle 4 gemzar was given 05-22-14, with CA 125 2680. CDDP was added to gemzar beginning 06-05-14. She had cycle 5 CDDP gemzar on 09-04-14. Treatment changed to tamoxifen 09-30-14 due to poor tolerance of CDDP gemzar without clinical improvement. CT AP 12-13-14 compared with 06-2014 had some increase in the pelvic mass, but no new areas of involvement.    Review of systems as above, also: No problems with PAC, flushed Remainder of 10 point Review of Systems negative.  Objective:  Vital signs in last 24 hours:  BP 125/72 mmHg  Pulse 87  Temp(Src) 98.2 F (36.8 C) (Oral)  Resp 18  Ht _0  (1.575 m)  Wt 123 lb (55.792 kg)  BMI 22.49 kg/m2 Weight up 2 lbs Alert, oriented and appropriate. Ambulatory without assistance.  No alopecia  HEENT:PERRL, sclerae not icteric. Oral mucosa moist without lesions, posterior pharynx clear.  Neck supple. No JVD.  Lymphatics:no cervical,supraclavicular adenopathy Resp: clear to auscultation bilaterally  Cardio: regular rate and rhythm. No gallop. GI: soft, nontender, ileostomy functioning Musculoskeletal/ Extremities: RLE tight swelling as baseline, LLE without pitting edema, cords, tenderness Neuro: no peripheral neuropathy. Otherwise nonfocal Skin without rash, ecchymosis, petechiae Portacath-without erythema or tenderness  Lab Results:  Results for orders placed or performed in visit on 01/06/15  CBC with Differential  Result Value Ref Range   WBC 7.3 3.9 - 10.3 10e3/uL   NEUT# 5.5 1.5 - 6.5 10e3/uL   HGB  11.7 11.6 - 15.9 g/dL   HCT 35.2 34.8 - 46.6 %   Platelets 250 145 - 400 10e3/uL   MCV 88.7 79.5 - 101.0 fL   MCH 29.4 25.1 - 34.0 pg   MCHC 33.2 31.5 - 36.0 g/dL   RBC 3.97 3.70 - 5.45 10e6/uL   RDW 14.8 (H) 11.2 - 14.5 %   lymph# 0.9 0.9 - 3.3  10e3/uL   MONO# 0.7 0.1 - 0.9 10e3/uL   Eosinophils Absolute 0.1 0.0 - 0.5 10e3/uL   Basophils Absolute 0.1 0.0 - 0.1 10e3/uL   NEUT% 76.1 38.4 - 76.8 %   LYMPH% 12.1 (L) 14.0 - 49.7 %   MONO% 9.2 0.0 - 14.0 %   EOS% 1.6 0.0 - 7.0 %   BASO% 1.0 0.0 - 2.0 %  Comprehensive metabolic panel (Cmet) - [facility]  Result Value Ref Range   Sodium 138 136 - 145 mEq/L   Potassium 4.0 3.5 - 5.1 mEq/L   Chloride 102 98 - 109 mEq/L   CO2 26 22 - 29 mEq/L   Glucose 113 70 - 140 mg/dl   BUN 11.0 7.0 - 26.0 mg/dL   Creatinine 0.9 0.6 - 1.1 mg/dL   Total Bilirubin 0.28 0.20 - 1.20 mg/dL   Alkaline Phosphatase 70 40 - 150 U/L   AST 19 5 - 34 U/L   ALT 13 0 - 55 U/L   Total Protein 6.3 (L) 6.4 - 8.3 g/dL   Albumin 3.2 (L) 3.5 - 5.0 g/dL   Calcium 9.1 8.4 - 10.4 mg/dL   Anion Gap 10 3 - 11 mEq/L   EGFR 69 (L) >90 ml/min/1.73 m2   CA 125 11-18-14   3202, compared with 09-25-14  691  Studies/Results:  No results found.  Medications: I have reviewed the patient's current medications. Prescriptions given for MS Contin and MSIR  DISCUSSION: Patient feels present doses of pain medication are adequate. Will set up next appointment here when radiation schedule is available. Dr Clabe Seal assistance appreciated  Assessment/Plan:  1.advanced serous gyn carcinoma: radiation to pelvic disease in palliative atempt to begin in near future. I will see her back with labs during that treatment. 2.Right hydronephrosis with JJ stent: symptoms much better with myrbetrig and the MS Contin. Dr Diona Fanti following.  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 Much improved off of chemo now. Iron low normal on 09-04-14, continue oral iron as tolerated 9. Progressive weight loss: discussed diet  again, try supplements.  10. She does not have Advance Directives per EMR documentation.  11. Lymphedema RLE stable   Patient understands plans and is in agreement. She will let us know if she needs anything from this office during radiation. Time spent 15 min including >50% counseling and coordination of care.   [name],LENNIS P, MD   01/06/2015, 5:15 PM

## 2015-01-07 ENCOUNTER — Other Ambulatory Visit: Payer: Self-pay | Admitting: Oncology

## 2015-01-07 ENCOUNTER — Ambulatory Visit
Admission: RE | Admit: 2015-01-07 | Discharge: 2015-01-07 | Disposition: A | Discharge status: Home / Self Care | Payer: BC Managed Care – PPO | Source: Ambulatory Visit | Attending: Radiation Oncology | Admitting: Radiation Oncology

## 2015-01-07 DIAGNOSIS — C569 Malignant neoplasm of unspecified ovary: Secondary | ICD-10-CM

## 2015-01-07 DIAGNOSIS — Z510 Encounter for antineoplastic radiation therapy: Secondary | ICD-10-CM | POA: Diagnosis not present

## 2015-01-07 NOTE — Progress Notes (Signed)
  Radiation Oncology         ([phone] ________________________________  Name: Kirsten Schroeder MRN: [id number]  Date: 01/07/2015  DOB: March 17, 1956  SIMULATION AND TREATMENT PLANNING NOTE    ICD-9-CM ICD-10-CM   1. 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  Identity was confirmed.  All relevant records and images related to the planned course of therapy were reviewed.  The patient freely provided informed written consent to proceed with treatment after reviewing the details related to the planned course of therapy. The consent form was witnessed and verified by the simulation staff.  Then, the patient was set-up in a stable reproducible  supine position for radiation therapy.  CT images were obtained.  Surface markings were placed.  The CT images were loaded into the planning software.  Then the target and avoidance structures were contoured.  Treatment planning then occurred.  The radiation prescription was entered and confirmed.  Then, I designed and supervised the construction of a total of 1 medically necessary complex treatment devices.  I have requested : 3D Simulation  I have requested a DVH of the following structures: GTV, PTV, bladder, bowel.  I have ordered:dose calc.  PLAN:  The patient will receive 45 Gy in 25 fractions.  ________________________________  -----------------------------------  Blair Promise, PhD, MD

## 2015-01-08 ENCOUNTER — Telehealth: Payer: Self-pay | Admitting: Oncology

## 2015-01-08 DIAGNOSIS — Z510 Encounter for antineoplastic radiation therapy: Secondary | ICD-10-CM | POA: Diagnosis not present

## 2015-01-08 NOTE — Telephone Encounter (Signed)
Called patient and she is aware of her follow up °

## 2015-01-09 DIAGNOSIS — Z510 Encounter for antineoplastic radiation therapy: Secondary | ICD-10-CM | POA: Diagnosis not present

## 2015-01-14 ENCOUNTER — Ambulatory Visit
Admission: RE | Admit: 2015-01-14 | Discharge: 2015-01-14 | Disposition: A | Discharge status: Home / Self Care | Payer: BC Managed Care – PPO | Source: Ambulatory Visit | Attending: Radiation Oncology | Admitting: Radiation Oncology

## 2015-01-14 DIAGNOSIS — Z510 Encounter for antineoplastic radiation therapy: Secondary | ICD-10-CM | POA: Diagnosis not present

## 2015-01-14 DIAGNOSIS — C569 Malignant neoplasm of unspecified ovary: Secondary | ICD-10-CM

## 2015-01-14 NOTE — Progress Notes (Signed)
  Radiation Oncology         [phone] ________________________________  Name: Kirsten Schroeder MRN: [id number]  Date: 01/14/2015  DOB: 12-22-1955  Simulation Verification Note    ICD-9-CM ICD-10-CM   1. Ovarian cancer, unspecified laterality 183.0 C56.9     Status: outpatient  NARRATIVE: The patient was brought to the treatment unit and placed in the planned treatment position. The clinical setup was verified. Then port films were obtained and uploaded to the radiation oncology medical record software.  The treatment beams were carefully compared against the planned radiation fields. The position location and shape of the radiation fields was reviewed. They targeted volume of tissue appears to be appropriately covered by the radiation beams. Organs at risk appear to be excluded as planned.  Based on my personal review, I approved the simulation verification. The patient's treatment will proceed as planned.  -----------------------------------  Blair Promise, PhD, MD

## 2015-01-15 ENCOUNTER — Ambulatory Visit
Admission: RE | Admit: 2015-01-15 | Discharge: 2015-01-15 | Disposition: A | Discharge status: Home / Self Care | Payer: BC Managed Care – PPO | Source: Ambulatory Visit | Attending: Radiation Oncology | Admitting: Radiation Oncology

## 2015-01-15 DIAGNOSIS — Z510 Encounter for antineoplastic radiation therapy: Secondary | ICD-10-CM | POA: Diagnosis not present

## 2015-01-16 ENCOUNTER — Ambulatory Visit
Admission: RE | Admit: 2015-01-16 | Discharge: 2015-01-16 | Disposition: A | Discharge status: Home / Self Care | Payer: BC Managed Care – PPO | Source: Ambulatory Visit | Attending: Radiation Oncology | Admitting: Radiation Oncology

## 2015-01-16 DIAGNOSIS — Z510 Encounter for antineoplastic radiation therapy: Secondary | ICD-10-CM | POA: Diagnosis not present

## 2015-01-17 ENCOUNTER — Ambulatory Visit
Admission: RE | Admit: 2015-01-17 | Discharge: 2015-01-17 | Disposition: A | Discharge status: Home / Self Care | Payer: BC Managed Care – PPO | Source: Ambulatory Visit | Attending: Radiation Oncology | Admitting: Radiation Oncology

## 2015-01-17 DIAGNOSIS — Z510 Encounter for antineoplastic radiation therapy: Secondary | ICD-10-CM | POA: Diagnosis not present

## 2015-01-20 ENCOUNTER — Ambulatory Visit
Admission: RE | Admit: 2015-01-20 | Discharge: 2015-01-20 | Disposition: A | Discharge status: Home / Self Care | Payer: BC Managed Care – PPO | Source: Ambulatory Visit | Attending: Radiation Oncology | Admitting: Radiation Oncology

## 2015-01-20 DIAGNOSIS — Z510 Encounter for antineoplastic radiation therapy: Secondary | ICD-10-CM | POA: Diagnosis not present

## 2015-01-21 ENCOUNTER — Ambulatory Visit
Admission: RE | Admit: 2015-01-21 | Discharge: 2015-01-21 | Disposition: A | Discharge status: Home / Self Care | Payer: BC Managed Care – PPO | Source: Ambulatory Visit | Attending: Radiation Oncology | Admitting: Radiation Oncology

## 2015-01-21 ENCOUNTER — Encounter: Payer: Self-pay | Admitting: Radiation Oncology

## 2015-01-21 VITALS — BP 112/67 | HR 82 | Temp 98.8°F | Resp 16 | Ht 62.0 in | Wt 119.6 lb

## 2015-01-21 DIAGNOSIS — Z510 Encounter for antineoplastic radiation therapy: Secondary | ICD-10-CM | POA: Diagnosis not present

## 2015-01-21 DIAGNOSIS — C569 Malignant neoplasm of unspecified ovary: Secondary | ICD-10-CM

## 2015-01-21 DIAGNOSIS — C772 Secondary and unspecified malignant neoplasm of intra-abdominal lymph nodes: Secondary | ICD-10-CM

## 2015-01-21 NOTE — Progress Notes (Signed)
Pt here for patient teaching.  Pt given Radiation and You booklet and skin care instructions. Pt reports they have watched the Radiation Therapy Education video.  Reviewed areas of pertinence such as diarrhea, fatigue, hair loss, nausea and vomiting, skin changes and urinary and bladder changes . Pt able to give teach back of to pat skin, use unscented/gentle soap, have Imodium on hand, drink plenty of water and sitz bath,avoid applying anything to skin within 4 hours of treatment. Pt demonstrated understanding and verbalizes understanding of information given and will contact nursing with any questions or concerns.

## 2015-01-21 NOTE — Progress Notes (Signed)
  Radiation Oncology         [phone] ________________________________  Name: Kirsten Schroeder MRN: [id number]  Date: 01/21/2015  DOB: 01-01-1956  Weekly Radiation Therapy Management    ICD-9-CM ICD-10-CM   1. Ovarian cancer, unspecified laterality 183.0 C56.9     Current Dose: 9 Gy     Planned Dose:  45 Gy  Narrative . . . . . . . . The patient presents for routine under treatment assessment.                                   The patient is without complaint. Pain is improved slightly. She continues on MS Contin and morphine for breakthrough pain.  she does have a poor appetite but denies any nausea.                                 Set-up films were reviewed.                                 The chart was checked. Physical Findings. . .  height is 5\' 2"  (1.575 m) and weight is 119 lb 9.6 oz (54.25 kg). Her oral temperature is 98.8 F (37.1 C). Her blood pressure is 112/67 and her pulse is 82. Her respiration is 16 and oxygen saturation is 99%. . The lungs are clear. The heart has a regular rhythm and rate. The abdomen is soft and nontender with normal bowel sounds. Impression . . . . . . . The patient is tolerating radiation. Plan . . . . . . . . . . . . Continue treatment as planned.  ________________________________   Blair Promise, PhD, MD

## 2015-01-21 NOTE — Progress Notes (Signed)
Kirsten Schroeder has completed 5 fractions to her pelvis.  She denies pain and is currently talking Morphine 30 mg bid and Morphine 15 mg in between as needed.  She reports her urinary frequency has improved on mybetriq and oxbutytin.  She is only going every 4 hours.  She reports her stools are normal in her colostomy bag.  She denies having any nausea.  She does report having a poor appetite.  She reports skin irritation in her vaginal area from her urinary frequency.  Advised her to try using Desitin.  She was using Aquaphor.    BP 112/67 mmHg  Pulse 82  Temp(Src) 98.8 F (37.1 C) (Oral)  Resp 16  Ht 5\' 2"  (1.575 m)  Wt 119 lb 9.6 oz (54.25 kg)  BMI 21.87 kg/m2  SpO2 99%   Wt Readings from Last 3 Encounters:  01/21/15 119 lb 9.6 oz (54.25 kg)  01/06/15 123 lb (55.792 kg)  01/01/15 121 lb 6.4 oz (55.067 kg)

## 2015-01-22 ENCOUNTER — Ambulatory Visit
Admission: RE | Admit: 2015-01-22 | Discharge: 2015-01-22 | Disposition: A | Discharge status: Home / Self Care | Payer: BC Managed Care – PPO | Source: Ambulatory Visit | Attending: Radiation Oncology | Admitting: Radiation Oncology

## 2015-01-22 DIAGNOSIS — Z510 Encounter for antineoplastic radiation therapy: Secondary | ICD-10-CM | POA: Diagnosis not present

## 2015-01-23 ENCOUNTER — Ambulatory Visit
Admission: RE | Admit: 2015-01-23 | Discharge: 2015-01-23 | Disposition: A | Discharge status: Home / Self Care | Payer: BC Managed Care – PPO | Source: Ambulatory Visit | Attending: Radiation Oncology | Admitting: Radiation Oncology

## 2015-01-23 DIAGNOSIS — Z510 Encounter for antineoplastic radiation therapy: Secondary | ICD-10-CM | POA: Diagnosis not present

## 2015-01-24 ENCOUNTER — Ambulatory Visit
Admission: RE | Admit: 2015-01-24 | Discharge: 2015-01-24 | Disposition: A | Discharge status: Home / Self Care | Payer: BC Managed Care – PPO | Source: Ambulatory Visit | Attending: Radiation Oncology | Admitting: Radiation Oncology

## 2015-01-24 DIAGNOSIS — Z510 Encounter for antineoplastic radiation therapy: Secondary | ICD-10-CM | POA: Diagnosis not present

## 2015-01-27 ENCOUNTER — Other Ambulatory Visit: Payer: Self-pay | Admitting: Oncology

## 2015-01-28 ENCOUNTER — Ambulatory Visit
Admission: RE | Admit: 2015-01-28 | Discharge: 2015-01-28 | Disposition: A | Discharge status: Home / Self Care | Payer: BC Managed Care – PPO | Source: Ambulatory Visit | Attending: Radiation Oncology | Admitting: Radiation Oncology

## 2015-01-28 ENCOUNTER — Encounter: Payer: Self-pay | Admitting: Radiation Oncology

## 2015-01-28 VITALS — BP 116/59 | HR 93 | Resp 16 | Wt 118.6 lb

## 2015-01-28 DIAGNOSIS — C569 Malignant neoplasm of unspecified ovary: Secondary | ICD-10-CM

## 2015-01-28 DIAGNOSIS — Z510 Encounter for antineoplastic radiation therapy: Secondary | ICD-10-CM | POA: Diagnosis not present

## 2015-01-28 NOTE — Progress Notes (Signed)
Weight and vitals stable. She denies pain and is currently talking Morphine 30 mg bid and Morphine 15 mg in between as needed. She reports her urinary frequency has improved on mybetriq and oxbutytin. She reports her stools are normal in her colostomy bag. She denies having any nausea or vomiting. She report having a poor appetite. She reports skin irritation in her vaginal area less this week than, last. Patient sitting on the edge of her chair due to discomfort not pain felt in perineal area. Denies vaginal odor, itching or discharge.     BP 116/59 mmHg  Pulse 93  Resp 16  Wt 118 lb 9.6 oz (53.797 kg) Wt Readings from Last 3 Encounters:  01/28/15 118 lb 9.6 oz (53.797 kg)  01/21/15 119 lb 9.6 oz (54.25 kg)  01/06/15 123 lb (55.792 kg)

## 2015-01-28 NOTE — Progress Notes (Signed)
  Radiation Oncology         [phone] ________________________________  Name: Kirsten Schroeder MRN: [id number]  Date: 01/28/2015  DOB: 05/15/1956  Weekly Radiation Therapy Management    ICD-9-CM ICD-10-CM   1. Ovarian cancer, unspecified laterality 183.0 C56.9      Current Dose: 16.2 Gy     Planned Dose:  45 Gy  Narrative . . . . . . . . The patient presents for routine under treatment assessment.                                   She denies pain and is currently talking Morphine 30 mg bid and Morphine 15 mg in between as needed. She reports her urinary frequency has improved on mybetriq and oxbutytin. She reports her stools are normal in her colostomy bag. She denies having any nausea or vomiting. She report having a poor appetite. She reports skin irritation in her vaginal area less this week than, last.                                 Set-up films were reviewed.                                 The chart was checked. Physical Findings. . .  weight is 118 lb 9.6 oz (53.797 kg). Her blood pressure is 116/59 and her pulse is 93. Her respiration is 16. . The lungs are clear. The heart has a regular rhythm and rate.  The abdomen is soft and nontender with normal bowel sounds. 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 . . . . . . The patient is tolerating radiation. Plan . . . . . . . . . . . . Continue treatment as planned.  ________________________________   Blair Promise, PhD, MD

## 2015-01-29 ENCOUNTER — Ambulatory Visit
Admission: RE | Admit: 2015-01-29 | Discharge: 2015-01-29 | Disposition: A | Discharge status: Home / Self Care | Payer: BC Managed Care – PPO | Source: Ambulatory Visit | Attending: Radiation Oncology | Admitting: Radiation Oncology

## 2015-01-29 DIAGNOSIS — Z510 Encounter for antineoplastic radiation therapy: Secondary | ICD-10-CM | POA: Diagnosis not present

## 2015-01-30 ENCOUNTER — Ambulatory Visit
Admission: RE | Admit: 2015-01-30 | Discharge: 2015-01-30 | Disposition: A | Discharge status: Home / Self Care | Payer: BC Managed Care – PPO | Source: Ambulatory Visit | Attending: Radiation Oncology | Admitting: Radiation Oncology

## 2015-01-30 DIAGNOSIS — Z510 Encounter for antineoplastic radiation therapy: Secondary | ICD-10-CM | POA: Diagnosis not present

## 2015-01-31 ENCOUNTER — Ambulatory Visit
Admission: RE | Admit: 2015-01-31 | Discharge: 2015-01-31 | Disposition: A | Discharge status: Home / Self Care | Payer: BC Managed Care – PPO | Source: Ambulatory Visit | Attending: Radiation Oncology | Admitting: Radiation Oncology

## 2015-01-31 DIAGNOSIS — Z510 Encounter for antineoplastic radiation therapy: Secondary | ICD-10-CM | POA: Diagnosis not present

## 2015-02-03 ENCOUNTER — Encounter: Payer: Self-pay | Admitting: Oncology

## 2015-02-03 ENCOUNTER — Ambulatory Visit (HOSPITAL_BASED_OUTPATIENT_CLINIC_OR_DEPARTMENT_OTHER): Payer: BC Managed Care – PPO

## 2015-02-03 ENCOUNTER — Ambulatory Visit
Admission: RE | Admit: 2015-02-03 | Discharge: 2015-02-03 | Disposition: A | Discharge status: Home / Self Care | Payer: BC Managed Care – PPO | Source: Ambulatory Visit | Attending: Radiation Oncology | Admitting: Radiation Oncology

## 2015-02-03 ENCOUNTER — Ambulatory Visit (HOSPITAL_BASED_OUTPATIENT_CLINIC_OR_DEPARTMENT_OTHER): Payer: BC Managed Care – PPO | Admitting: Oncology

## 2015-02-03 ENCOUNTER — Other Ambulatory Visit (HOSPITAL_BASED_OUTPATIENT_CLINIC_OR_DEPARTMENT_OTHER): Payer: BC Managed Care – PPO

## 2015-02-03 VITALS — BP 127/70 | HR 80 | Temp 98.3°F | Resp 18 | Ht 62.0 in | Wt 120.0 lb

## 2015-02-03 DIAGNOSIS — G622 Polyneuropathy due to other toxic agents: Secondary | ICD-10-CM

## 2015-02-03 DIAGNOSIS — G620 Drug-induced polyneuropathy: Secondary | ICD-10-CM

## 2015-02-03 DIAGNOSIS — N1330 Unspecified hydronephrosis: Secondary | ICD-10-CM | POA: Diagnosis not present

## 2015-02-03 DIAGNOSIS — Z95828 Presence of other vascular implants and grafts: Secondary | ICD-10-CM

## 2015-02-03 DIAGNOSIS — C772 Secondary and unspecified malignant neoplasm of intra-abdominal lymph nodes: Secondary | ICD-10-CM

## 2015-02-03 DIAGNOSIS — R300 Dysuria: Secondary | ICD-10-CM | POA: Diagnosis not present

## 2015-02-03 DIAGNOSIS — Z510 Encounter for antineoplastic radiation therapy: Secondary | ICD-10-CM | POA: Diagnosis not present

## 2015-02-03 DIAGNOSIS — C569 Malignant neoplasm of unspecified ovary: Secondary | ICD-10-CM

## 2015-02-03 DIAGNOSIS — D6481 Anemia due to antineoplastic chemotherapy: Secondary | ICD-10-CM

## 2015-02-03 DIAGNOSIS — G893 Neoplasm related pain (acute) (chronic): Secondary | ICD-10-CM

## 2015-02-03 DIAGNOSIS — C801 Malignant (primary) neoplasm, unspecified: Secondary | ICD-10-CM

## 2015-02-03 DIAGNOSIS — C774 Secondary and unspecified malignant neoplasm of inguinal and lower limb lymph nodes: Secondary | ICD-10-CM | POA: Diagnosis not present

## 2015-02-03 DIAGNOSIS — Z9889 Other specified postprocedural states: Secondary | ICD-10-CM

## 2015-02-03 DIAGNOSIS — T451X5A Adverse effect of antineoplastic and immunosuppressive drugs, initial encounter: Secondary | ICD-10-CM

## 2015-02-03 DIAGNOSIS — I890 Lymphedema, not elsewhere classified: Secondary | ICD-10-CM

## 2015-02-03 LAB — COMPREHENSIVE METABOLIC PANEL (CC13)
ALBUMIN: 3.1 g/dL — AB (ref 3.5–5.0)
ALT: 10 U/L (ref 0–55)
ANION GAP: 7 meq/L (ref 3–11)
AST: 16 U/L (ref 5–34)
Alkaline Phosphatase: 58 U/L (ref 40–150)
BILIRUBIN TOTAL: 0.33 mg/dL (ref 0.20–1.20)
BUN: 11 mg/dL (ref 7.0–26.0)
CALCIUM: 9 mg/dL (ref 8.4–10.4)
CO2: 28 mEq/L (ref 22–29)
CREATININE: 0.8 mg/dL (ref 0.6–1.1)
Chloride: 103 mEq/L (ref 98–109)
EGFR: 87 mL/min/{1.73_m2} — ABNORMAL LOW (ref >90)
Glucose: 113 mg/dl (ref 70–140)
Potassium: 4.1 mEq/L (ref 3.5–5.1)
Sodium: 138 mEq/L (ref 136–145)
TOTAL PROTEIN: 6.3 g/dL — AB (ref 6.4–8.3)

## 2015-02-03 LAB — CBC WITH DIFFERENTIAL/PLATELET
BASO%: 0.6 % (ref 0.0–2.0)
Basophils Absolute: 0 10*3/uL (ref 0.0–0.1)
EOS%: 1.5 % (ref 0.0–7.0)
Eosinophils Absolute: 0.1 10*3/uL (ref 0.0–0.5)
HEMATOCRIT: 33.2 % — AB (ref 34.8–46.6)
HEMOGLOBIN: 11 g/dL — AB (ref 11.6–15.9)
LYMPH#: 0.5 10*3/uL — AB (ref 0.9–3.3)
LYMPH%: 7.1 % — ABNORMAL LOW (ref 14.0–49.7)
MCH: 29.5 pg (ref 25.1–34.0)
MCHC: 33.2 g/dL (ref 31.5–36.0)
MCV: 88.7 fL (ref 79.5–101.0)
MONO#: 0.5 10*3/uL (ref 0.1–0.9)
MONO%: 8.6 % (ref 0.0–14.0)
NEUT%: 82.2 % — AB (ref 38.4–76.8)
NEUTROS ABS: 5.3 10*3/uL (ref 1.5–6.5)
PLATELETS: 235 10*3/uL (ref 145–400)
RBC: 3.75 10*6/uL (ref 3.70–5.45)
RDW: 15.2 % — AB (ref 11.2–14.5)
WBC: 6.4 10*3/uL (ref 3.9–10.3)

## 2015-02-03 MED ORDER — HEPARIN SOD (PORK) LOCK FLUSH 100 UNIT/ML IV SOLN
500.0000 [IU] | Freq: Once | INTRAVENOUS | Status: AC
  Administered 2015-02-03: 500 [IU] via INTRAVENOUS
  Filled 2015-02-03: qty 5

## 2015-02-03 MED ORDER — SODIUM CHLORIDE 0.9 % IJ SOLN
10.0000 mL | INTRAMUSCULAR | Status: DC | PRN
  Administered 2015-02-03: 10 mL via INTRAVENOUS
  Filled 2015-02-03: qty 10

## 2015-02-03 MED ORDER — MORPHINE SULFATE 15 MG PO TABS: 7.5000 mg | ORAL_TABLET | ORAL | Status: DC | PRN

## 2015-02-03 MED ORDER — MORPHINE SULFATE ER 30 MG PO TBCR: 30.0000 mg | EXTENDED_RELEASE_TABLET | Freq: Two times a day (BID) | ORAL | Status: DC

## 2015-02-03 NOTE — Progress Notes (Signed)
OFFICE PROGRESS NOTE   February 03, 2015   Physicians:ClarkePearson, Quillian Quince; Evalee Mutton, Marlene Bast, Floy Sabina, Annie Main.; Gery Pray.   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 Radiation is planned thru 02-19-15.   Patient has been able to sit more easily and even able to sleep on her back this week. She continues MSContin 30 bid with MSIR 15 min usually 1-2 times daily. She has some fatigue but still is doing some light activity at home. She has not had much nausea and bowels are moving unchanged via colostomy. She has sitz bath available, discussed use. She is having no pain now with voiding and feels that she is emptying bladder. Swelling in RLE is less. She has no new pain. She is eating and drinking fluids.    PAC in, flushed today Right JJ stent No genetics testing. CA 125 at diagnosis [place] Patient presented with RLE swelling negative for DVT July 2014, and right hydronephrosis. Biopsy of left inguinal node ([id number]) found carcinoma consistent with serous gyn primary.Neoadjuvant dose dense taxol carboplatin began 01-31-13, with improvement in pelvic mass and adenopathy by scans after 3 cycles.. She had 3 additional cycles, with progressive decrease in CA 125 from 5626 in 12-2012 to 596 by Jan 2015. CT 06-14-13 had ~ stable pelvic mass and adenopathy. Dr Josephina Shih recommended another 3 cycles chemotherapy, regimen changed to q 3 week taxol carboplatin on 06-27-13, which unfortunately caused worsening of peripheral neuropathy and severe taxol aches.Taxotere was substituted for taxol due to neuropathy in feet for cycles 8 and 9, completed 08-08-13. CA 125 improved to low of 494 in Feb 2015, then 732 just prior to cycle 9. CT AP 08-22-13 had minimal improvement, still not adequate to consider  debulking surgery. She received doxil for 6 cycles, from 09-19-13 thru 02-06-14. CT AP 02-27-14 had some early increase in central pelvic mass, with treatment changed to gemzar beginning 03-29-14. CA 125 baseline for gemzar was 1932 on 03-27-14. Cycle 4 gemzar was given 05-22-14, with CA 125 2680. CDDP was added to gemzar beginning 06-05-14. She had cycle 5 CDDP gemzar on 09-04-14. Treatment changed to tamoxifen 09-30-14 due to poor tolerance of CDDP gemzar without clinical improvement. CT AP 12-13-14 compared with 06-2014 had some increase in the pelvic mass, but no new areas of involvement.     Review of systems as above, also: No fever or symptoms of infection. No bleeding. No increased SOB Remainder of 10 point Review of Systems negative.  Objective:  Vital signs in last 24 hours:  BP 127/70 mmHg  Pulse 80  Temp(Src) 98.3 F (36.8 C) (Oral)  Resp 18  Ht 5' 2"  (1.575 m)  Wt 120 lb (54.432 kg)  BMI 21.94 kg/m2  SpO2 100% Weight down 3 lbs. Alert, oriented and appropriate. Ambulatory without assistance.  Alopecia  HEENT:PERRL, sclerae not icteric. Oral mucosa moist without lesions, posterior pharynx clear.  Neck supple. No JVD.  Lymphatics:no cervical,supraclavicular adenopathy Resp: clear to auscultation bilaterally and normal percussion bilaterally Cardio: regular rate and rhythm. No gallop. GI: soft, nontender, not distended, no mass or organomegaly. Normally active bowel sounds. Ostomy LUQ. Musculoskeletal/ Extremities: RLE 2+ swelling not tight, improved. Other extremities without pitting edema, cords, tenderness Neuro: no change peripheral neuropathy. Otherwise nonfocal  PSYCH appropriate mood and affect Skin without rash, ecchymosis, petechiae Portacath-without erythema  or tenderness  Lab Results:  Results for orders placed or performed in visit on 02/03/15  CBC with Differential  Result Value Ref Range   WBC 6.4 3.9 - 10.3 10e3/uL   NEUT# 5.3 1.5 - 6.5 10e3/uL   HGB 11.0 (L)  11.6 - 15.9 g/dL   HCT 33.2 (L) 34.8 - 46.6 %   Platelets 235 145 - 400 10e3/uL   MCV 88.7 79.5 - 101.0 fL   MCH 29.5 25.1 - 34.0 pg   MCHC 33.2 31.5 - 36.0 g/dL   RBC 3.75 3.70 - 5.45 10e6/uL   RDW 15.2 (H) 11.2 - 14.5 %   lymph# 0.5 (L) 0.9 - 3.3 10e3/uL   MONO# 0.5 0.1 - 0.9 10e3/uL   Eosinophils Absolute 0.1 0.0 - 0.5 10e3/uL   Basophils Absolute 0.0 0.0 - 0.1 10e3/uL   NEUT% 82.2 (H) 38.4 - 76.8 %   LYMPH% 7.1 (L) 14.0 - 49.7 %   MONO% 8.6 0.0 - 14.0 %   EOS% 1.5 0.0 - 7.0 %   BASO% 0.6 0.0 - 2.0 %  Comprehensive metabolic panel (Cmet) - [facility]  Result Value Ref Range   Sodium 138 136 - 145 mEq/L   Potassium 4.1 3.5 - 5.1 mEq/L   Chloride 103 98 - 109 mEq/L   CO2 28 22 - 29 mEq/L   Glucose 113 70 - 140 mg/dl   BUN 11.0 7.0 - 26.0 mg/dL   Creatinine 0.8 0.6 - 1.1 mg/dL   Total Bilirubin 0.33 0.20 - 1.20 mg/dL   Alkaline Phosphatase 58 40 - 150 U/L   AST 16 5 - 34 U/L   ALT 10 0 - 55 U/L   Total Protein 6.3 (L) 6.4 - 8.3 g/dL   Albumin 3.1 (L) 3.5 - 5.0 g/dL   Calcium 9.0 8.4 - 10.4 mg/dL   Anion Gap 7 3 - 11 mEq/L   EGFR 87 (L) >90 ml/min/1.73 m2  Labs reviewed with patient and daughter  Studies/Results:  No results found.  Medications: I have reviewed the patient's current medications. MSContin and MSIR refills given, no changes in dose. Stop tamoxifen.  DISCUSSION: patient asking "what will we do next for treatment?" but understands that we will see how much improvement with radiation, realizing that maximum benefit from radiation is best assessed ~ 4 weeks after completion. We are pleased that she is tolerating this well so far, and is already seeing some benefit; fact that she has had colon and rectum removed likely is helping lessen GI side effects.   We discussed genetics counseling, which she has not had done but would be interested in pursuing, this appropriate with serous gyn carcinoma that could be ovarian primary.  Assessment/Plan:  1.advanced serous gyn  carcinoma: radiation to pelvic disease in palliative atempt, Dr Clabe Seal help appreciated . I will see her back with labs ~ early Oct. Refer to genetics counseling. 2.Right hydronephrosis with JJ stent: symptoms much better with myrbetrig and the MS Contin. Dr Diona Fanti following.  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 Improved off of chemo. Iron low normal on 09-04-14, continue oral iron as tolerated 9. Progressive weight loss: discussed diet again, try supplements.  10. She does not have Advance Directives per EMR documentation.  11. Lymphedema RLE also some better likely from the  radiation.  All questions answered. Time spent 25 min including >50% counseling and coordination of care. Cc Dr Diona Fanti    Gordy Levan, MD   02/03/2015, 5:56 PM

## 2015-02-03 NOTE — Patient Instructions (Signed)

## 2015-02-04 ENCOUNTER — Encounter: Payer: Self-pay | Admitting: Radiation Oncology

## 2015-02-04 ENCOUNTER — Ambulatory Visit
Admission: RE | Admit: 2015-02-04 | Discharge: 2015-02-04 | Disposition: A | Discharge status: Home / Self Care | Payer: BC Managed Care – PPO | Source: Ambulatory Visit | Attending: Radiation Oncology | Admitting: Radiation Oncology

## 2015-02-04 ENCOUNTER — Telehealth: Payer: Self-pay | Admitting: Oncology

## 2015-02-04 ENCOUNTER — Telehealth: Payer: Self-pay

## 2015-02-04 VITALS — BP 109/53 | HR 90 | Temp 99.0°F | Resp 16 | Ht 62.0 in | Wt 118.8 lb

## 2015-02-04 DIAGNOSIS — Z510 Encounter for antineoplastic radiation therapy: Secondary | ICD-10-CM | POA: Diagnosis not present

## 2015-02-04 DIAGNOSIS — C569 Malignant neoplasm of unspecified ovary: Secondary | ICD-10-CM

## 2015-02-04 NOTE — Progress Notes (Signed)
Kirsten Schroeder has completed 14 fractions to her pelvis.  She denies pain today.  She is taking morphine 25 mg twice a day and usually morphine 15 mg once a day before treatment.  She reports she is having some dysuria in the mornings that goes away during the day.  She is using her sitz bath which helps.  She denies having nausea and said her appetite is improving.  She has stopped taking tamoxifen per Dr. Marko Plume.  She denies having any vaginal/rectal bleeding.  She reports her stool is formed in her ostomy bag.  She reports fatigue in the middle of the week.  She has more energy towards the end of the week.  She denies having any skin irritation.  BP 109/53 mmHg  Pulse 90  Temp(Src) 99 F (37.2 C) (Oral)  Resp 16  Ht 5\' 2"  (1.575 m)  Wt 118 lb 12.8 oz (53.887 kg)  BMI 21.72 kg/m2  SpO2 100%   Wt Readings from Last 3 Encounters:  02/04/15 118 lb 12.8 oz (53.887 kg)  02/03/15 120 lb (54.432 kg)  01/28/15 118 lb 9.6 oz (53.797 kg)

## 2015-02-04 NOTE — Telephone Encounter (Signed)
Called patient and she is aware of her upcoming appointments °

## 2015-02-04 NOTE — Progress Notes (Signed)
  Radiation Oncology         [phone] ________________________________  Name: Kirsten Schroeder MRN: [id number]  Date: 02/04/2015  DOB: 30-Oct-1955  Weekly Radiation Therapy Management    ICD-9-CM ICD-10-CM   1. Ovarian cancer, unspecified laterality 183.0 C56.9      Current Dose: 25.2 Gy     Planned Dose:  45 Gy  Narrative . . . . . . . . The patient presents for routine under treatment assessment.                                   The patient is without complaint. She denies pain today. She is taking morphine 25 mg twice a day and usually morphine 15 mg once a day before treatment. She reports she is having some dysuria in the mornings that goes away during the day. She is using her sitz bath which helps. She denies having nausea and said her appetite is improving. She has stopped taking tamoxifen per Dr. Marko Plume. She denies having any vaginal/rectal bleeding. She reports her stool is formed in her ostomy bag. She reports fatigue in the middle of the week. She has more energy towards the end of the week. She denies having any skin irritation.                                 Set-up films were reviewed.                                 The chart was checked. Physical Findings. . .  height is 5\' 2"  (1.575 m) and weight is 118 lb 12.8 oz (53.887 kg). Her oral temperature is 99 F (37.2 C). Her blood pressure is 109/53 and her pulse is 90. Her respiration is 16 and oxygen saturation is 100%. . The lungs are clear. The heart has a regular rhythm and rate. The abdomen is soft and nontender with normal bowel sounds. Impression . . . . . . . The patient is tolerating radiation. Plan . . . . . . . . . . . . Continue treatment as planned.  ________________________________   Blair Promise, PhD, MD

## 2015-02-05 ENCOUNTER — Ambulatory Visit
Admission: RE | Admit: 2015-02-05 | Discharge: 2015-02-05 | Disposition: A | Discharge status: Home / Self Care | Payer: BC Managed Care – PPO | Source: Ambulatory Visit | Attending: Radiation Oncology | Admitting: Radiation Oncology

## 2015-02-05 DIAGNOSIS — Z510 Encounter for antineoplastic radiation therapy: Secondary | ICD-10-CM | POA: Diagnosis not present

## 2015-02-06 ENCOUNTER — Ambulatory Visit
Admission: RE | Admit: 2015-02-06 | Discharge: 2015-02-06 | Disposition: A | Discharge status: Home / Self Care | Payer: BC Managed Care – PPO | Source: Ambulatory Visit | Attending: Radiation Oncology | Admitting: Radiation Oncology

## 2015-02-06 DIAGNOSIS — Z510 Encounter for antineoplastic radiation therapy: Secondary | ICD-10-CM | POA: Diagnosis not present

## 2015-02-07 ENCOUNTER — Ambulatory Visit
Admission: RE | Admit: 2015-02-07 | Discharge: 2015-02-07 | Disposition: A | Discharge status: Home / Self Care | Payer: BC Managed Care – PPO | Source: Ambulatory Visit | Attending: Radiation Oncology | Admitting: Radiation Oncology

## 2015-02-07 DIAGNOSIS — Z510 Encounter for antineoplastic radiation therapy: Secondary | ICD-10-CM | POA: Diagnosis not present

## 2015-02-10 ENCOUNTER — Ambulatory Visit
Admission: RE | Admit: 2015-02-10 | Discharge: 2015-02-10 | Disposition: A | Discharge status: Home / Self Care | Payer: BC Managed Care – PPO | Source: Ambulatory Visit | Attending: Radiation Oncology | Admitting: Radiation Oncology

## 2015-02-10 DIAGNOSIS — Z510 Encounter for antineoplastic radiation therapy: Secondary | ICD-10-CM | POA: Diagnosis not present

## 2015-02-11 ENCOUNTER — Ambulatory Visit
Admission: RE | Admit: 2015-02-11 | Discharge: 2015-02-11 | Disposition: A | Discharge status: Home / Self Care | Payer: BC Managed Care – PPO | Source: Ambulatory Visit | Attending: Radiation Oncology | Admitting: Radiation Oncology

## 2015-02-11 ENCOUNTER — Encounter: Payer: Self-pay | Admitting: Radiation Oncology

## 2015-02-11 VITALS — BP 127/54 | HR 91 | Temp 98.9°F | Ht 62.0 in | Wt 118.9 lb

## 2015-02-11 DIAGNOSIS — C569 Malignant neoplasm of unspecified ovary: Secondary | ICD-10-CM

## 2015-02-11 DIAGNOSIS — Z510 Encounter for antineoplastic radiation therapy: Secondary | ICD-10-CM | POA: Diagnosis not present

## 2015-02-11 NOTE — Progress Notes (Signed)
  Radiation Oncology         [phone] ________________________________  Name: Kirsten Schroeder MRN: [id number]  Date: 02/11/2015  DOB: Aug 31, 1955  Weekly Radiation Therapy Management  Ovarian cancer, unspecified laterality   Current Dose: 34.2 Gy     Planned Dose:  45 Gy  Narrative . . . . . . . . The patient presents for routine under treatment assessment.                                   The patient has improvement in her overall pain within the pelvis area. She has had some nausea which she attributes to her bladder medications and will see her urologist further adjustment tomorrow                                 Set-up films were reviewed.                                 The chart was checked. Physical Findings. . .  height is 5\' 2"  (1.575 m) and weight is 118 lb 14.4 oz (53.933 kg). Her oral temperature is 98.9 F (37.2 C). Her blood pressure is 127/54 and her pulse is 91. . The lungs are clear. The heart has a regular rhythm and rate. The abdomen is soft and nontender with normal bowel sounds. Impression . . . . . . . The patient is tolerating radiation. Plan . . . . . . . . . . . . Continue treatment as planned.  ________________________________   Blair Promise, PhD, MD

## 2015-02-11 NOTE — Progress Notes (Signed)
Kirsten Schroeder has completed 19 fractions to her pelvis.  She reports lower back pain and states that she is on 2 kidney medications that could be causing this.  They are also causing nausea and vomiting with 2 episodes of vomiting today.  She is going to see her urologist tomorrow to have them adjusted.  She reports occasional dysuria, worse today.  She denies having any vaginal bleeding, diarrhea and skin irritation in the treatment area.  She reports fatigue in the mornings.  BP 127/54 mmHg  Pulse 91  Temp(Src) 98.9 F (37.2 C) (Oral)  Ht 5\' 2"  (1.575 m)  Wt 118 lb 14.4 oz (53.933 kg)  BMI 21.74 kg/m2   Wt Readings from Last 3 Encounters:  02/11/15 118 lb 14.4 oz (53.933 kg)  02/04/15 118 lb 12.8 oz (53.887 kg)  02/03/15 120 lb (54.432 kg)

## 2015-02-12 ENCOUNTER — Ambulatory Visit
Admission: RE | Admit: 2015-02-12 | Discharge: 2015-02-12 | Disposition: A | Discharge status: Home / Self Care | Payer: BC Managed Care – PPO | Source: Ambulatory Visit | Attending: Radiation Oncology | Admitting: Radiation Oncology

## 2015-02-12 DIAGNOSIS — Z510 Encounter for antineoplastic radiation therapy: Secondary | ICD-10-CM | POA: Diagnosis not present

## 2015-02-13 ENCOUNTER — Ambulatory Visit
Admission: RE | Admit: 2015-02-13 | Discharge: 2015-02-13 | Disposition: A | Discharge status: Home / Self Care | Payer: BC Managed Care – PPO | Source: Ambulatory Visit | Attending: Radiation Oncology | Admitting: Radiation Oncology

## 2015-02-13 DIAGNOSIS — Z510 Encounter for antineoplastic radiation therapy: Secondary | ICD-10-CM | POA: Diagnosis not present

## 2015-02-14 ENCOUNTER — Ambulatory Visit
Admission: RE | Admit: 2015-02-14 | Discharge: 2015-02-14 | Disposition: A | Discharge status: Home / Self Care | Payer: BC Managed Care – PPO | Source: Ambulatory Visit | Attending: Radiation Oncology | Admitting: Radiation Oncology

## 2015-02-14 DIAGNOSIS — Z510 Encounter for antineoplastic radiation therapy: Secondary | ICD-10-CM | POA: Diagnosis not present

## 2015-02-17 ENCOUNTER — Ambulatory Visit
Admission: RE | Admit: 2015-02-17 | Discharge: 2015-02-17 | Disposition: A | Discharge status: Home / Self Care | Payer: BC Managed Care – PPO | Source: Ambulatory Visit | Attending: Radiation Oncology | Admitting: Radiation Oncology

## 2015-02-17 DIAGNOSIS — Z510 Encounter for antineoplastic radiation therapy: Secondary | ICD-10-CM | POA: Diagnosis not present

## 2015-02-18 ENCOUNTER — Ambulatory Visit
Admission: RE | Admit: 2015-02-18 | Discharge: 2015-02-18 | Disposition: A | Discharge status: Home / Self Care | Payer: BC Managed Care – PPO | Source: Ambulatory Visit | Attending: Radiation Oncology | Admitting: Radiation Oncology

## 2015-02-18 ENCOUNTER — Encounter: Payer: Self-pay | Admitting: Radiation Oncology

## 2015-02-18 VITALS — BP 123/60 | HR 89 | Temp 98.6°F | Ht 62.0 in | Wt 120.3 lb

## 2015-02-18 DIAGNOSIS — C569 Malignant neoplasm of unspecified ovary: Secondary | ICD-10-CM

## 2015-02-18 DIAGNOSIS — Z510 Encounter for antineoplastic radiation therapy: Secondary | ICD-10-CM | POA: Diagnosis not present

## 2015-02-18 NOTE — Progress Notes (Signed)
  Radiation Oncology         [phone] ________________________________  Name: Kirsten Schroeder MRN: [id number]  Date: 02/18/2015  DOB: 1955-12-13  Weekly Radiation Therapy Management   Ovarian cancer, unspecified laterality   Current Dose: 43.2 Gy     Planned Dose:  45 Gy  Narrative . . . . . . . . The patient presents for routine under treatment assessment.                                   Marquis Buggy has completed 24 fractions to her pelvis. She reports pain as an 8/10 in her lower back, which she thinks is from her kidneys. She will see her urologist on Friday. She reports burning with urination. She reports hematuria yesterday with a few drops of blood. She denies having loose stools, skin irriation in her pelvic area, or vaginal bleeding. She also reports fatigue.                                 Set-up films were reviewed.                                 The chart was checked. Physical Findings. . .  height is 5\' 2"  (1.575 m) and weight is 120 lb 4.8 oz (54.568 kg). Her oral temperature is 98.6 F (37 C). Her blood pressure is 123/60 and her pulse is 89. . The lungs are clear. The heart has a regular rhythm and rate. The abdomen is soft and nontender with normal bowel sounds. Impression . . . . . . . The patient is tolerating radiation. Plan . . . . . . . . . . . . Continue treatment as planned for one more tx.  This document serves as a record of services personally performed by Gery Pray, MD. It was created on his behalf by Darcus Austin, a trained medical scribe. The creation of this record is based on the scribe's personal observations and the provider's statements to them. This document has been checked and approved by the attending provider.  ________________________________   Blair Promise, PhD, MD

## 2015-02-18 NOTE — Progress Notes (Signed)
Kirsten Schroeder has completed 24 fractions to her pelvis.  She reports pain at an 8/10 in her lower back which she thinks is from her kidneys.  She will see her urologist on Friday.  She reports burning with urination.  She reports seeing drops of blood in her urine yesterday.  She denies having loose stools.  She denies having any skin irriation in her pelvic area.  She denies having vaginal bleeding.  She reports fatigue.  BP 123/60 mmHg  Pulse 89  Temp(Src) 98.6 F (37 C) (Oral)  Ht 5\' 2"  (1.575 m)  Wt 120 lb 4.8 oz (54.568 kg)  BMI 22.00 kg/m2

## 2015-02-19 ENCOUNTER — Encounter: Payer: Self-pay | Admitting: Genetic Counselor

## 2015-02-19 ENCOUNTER — Ambulatory Visit (HOSPITAL_BASED_OUTPATIENT_CLINIC_OR_DEPARTMENT_OTHER): Payer: BC Managed Care – PPO | Admitting: Genetic Counselor

## 2015-02-19 ENCOUNTER — Encounter: Payer: Self-pay | Admitting: Radiation Oncology

## 2015-02-19 ENCOUNTER — Other Ambulatory Visit: Payer: BC Managed Care – PPO

## 2015-02-19 ENCOUNTER — Ambulatory Visit
Admission: RE | Admit: 2015-02-19 | Discharge: 2015-02-19 | Disposition: A | Discharge status: Home / Self Care | Payer: BC Managed Care – PPO | Source: Ambulatory Visit | Attending: Radiation Oncology | Admitting: Radiation Oncology

## 2015-02-19 DIAGNOSIS — Z806 Family history of leukemia: Secondary | ICD-10-CM

## 2015-02-19 DIAGNOSIS — C569 Malignant neoplasm of unspecified ovary: Secondary | ICD-10-CM | POA: Diagnosis not present

## 2015-02-19 DIAGNOSIS — Z801 Family history of malignant neoplasm of trachea, bronchus and lung: Secondary | ICD-10-CM | POA: Diagnosis not present

## 2015-02-19 DIAGNOSIS — Z510 Encounter for antineoplastic radiation therapy: Secondary | ICD-10-CM | POA: Diagnosis not present

## 2015-02-19 DIAGNOSIS — Z800 Family history of malignant neoplasm of digestive organs: Secondary | ICD-10-CM

## 2015-02-19 DIAGNOSIS — Z808 Family history of malignant neoplasm of other organs or systems: Secondary | ICD-10-CM

## 2015-02-19 NOTE — Progress Notes (Signed)
REFERRING PROVIDER: Rosalita Chessman, DO [address] [place], [place] [address]  Evlyn Clines, MD  PRIMARY PROVIDER:  [name], DO  PRIMARY REASON FOR VISIT:  1. Ovarian cancer, unspecified laterality      HISTORY OF PRESENT ILLNESS:   Kirsten Schroeder, a 59 y.o. female, was seen for a [place] cancer genetics consultation at the request of Dr. Etter Sjogren due to a personal and family history of cancer.  Kirsten Schroeder presents to clinic today to discuss the possibility of a hereditary predisposition to cancer, genetic testing, and to further clarify her future cancer risks, as well as potential cancer risks for family members.   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 This was treated with surgery and chemotherapy.    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 Plan neoadjuvant chemotherapy prior to any surgical staging.  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 She had been generally stable until contracting norovirus in May 2014, with prolonged illness and weight loss then. She had yearly PE and first gyn exam by Dr Leo Grosser in June 2014. In July 2014 she developed RLE swelling and pain, with venous dopplers negative. She developed nausea, vomiting and fever, with abdominal US 01-10-13 which showed mild right hydronephrosis. She was admitted to [facility] from 8-20 thru 01-17-13; with evaluation including CT AP x2; consultations by Drs Diona Fanti, Benay Spice, Cornett and ClarkePearson;right ureteral stent placement; and US biopsy of left inguinal lymph node. Pathology [phone] carcinoma with immunohistochemical stains consistent with serous carcinoma of gyn origin. Dr Josephina Shih recommended dose dense taxol carboplatin, with possible interval debulking depending on  response after ~ 3 cycles. Cycle 1 day 1 dose dense taxol carboplatin was given on 01-31-13; day 15 cycle 1 was held with platelets 49k. Carboplatin dose was decreased to AUC=4 with cycle 2 on 02-21-13; day 15 cycle 2 was held due to symptomatic anemia, transfusion 2 units PRBCs on 03-07-13. Day 1 cycle 3 was given 03-14-13, day 8 on 10-29 + neupogen, and day 15 held 11-5 for platelets 51k. CT AP 04-10-13 showed improvement in pelvic mass and adenopathy, with recommendation for 3 additional cycles of chemotherapy before possible debulking surgery. Day 15 cycle 4 was held on 04-18-13 due to hemoglobin 7.8, with PRBC transfusion given.  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    01/10/2013 -  Hospital Admission R hydronephrosis, right ureteral stent, bx left inguinal node with serous carcinoma gyn origin  [id number]   01/31/2013 -  Chemotherapy dose dense carboplatin taxol     HORMONAL RISK FACTORS:  Menarche was at age 6.  First live birth at age 54.  OCP use for approximately 0 years.  Ovaries intact: no.  Hysterectomy: yes.  Menopausal status: postmenopausal.  HRT use: 0 years. Colonoscopy: yes; patient had ulcerative colitis and had her colon removed in 1990. Mammogram within the last year: no. Number of breast biopsies: 0. Up to date with pelvic exams:  no. Any excessive radiation exposure in the past:  no  Past Medical History  Diagnosis Date  . Ulcerative  colitis   . Hydronephrosis, right   . Ejection fraction < 50%     EF 56%  AND NORMAL WALL MOTION --  PER  MUGA  09-06-2013  . 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 Wears glasses   . Hydronephrosis, right   . Chemotherapy induced thrombocytopenia   . Peripheral neuropathy due to chemotherapy   . Hypertension     PT CURRENT NOT TAKING BP MEDICATION PER PCP SINCE BP IS LOW SECONDARY TO CHEMO  . 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 Bone metastasis     MILD ---  SECONDARY OVARIAN CANCER  . 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 Cystoscopy w/ ureteral stent placement Right 01/12/2013    Procedure: CYSTOSCOPY WITH RETROGRADE PYELOGRAM/ RIGHT DOUBLE J STENT PLACEMENT;  Surgeon: Franchot Gallo, MD;  Location: [facility];  Service: Urology;  Laterality: Right;  . Cesarean section    . Cholecystectomy  1992  . Total colectomy w/ permanent ileostomy  1990  (2 PART SURGERY)    SEVERE UC  . Cystoscopy w/ ureteral stent placement Right 09/13/2013    Procedure: CYSTOSCOPY WITH STENT REPLACEMENT;  Surgeon: Franchot Gallo, MD;  Location: [facility];  Service: Urology;  Laterality: Right;  . Cystoscopy w/ ureteral stent placement Right 07/22/2014    Procedure: CYSTOSCOPY WITH STENT REPLACEMENT;  Surgeon: Jorja Loa, MD;  Location: [facility];  Service: Urology;  Laterality: Right;    Social History   Social History  . Marital Status: Divorced    Spouse Name: N/A  . Number of Children: 2  . Years of Education: N/A   Occupational History  . [employer] [place] History Main Topics  . Smoking status: Never Smoker   . Smokeless tobacco: Never Used  . Alcohol Use: No  . Drug Use: No  . Sexual Activity: Not Asked   Other Topics Concern  . None   Social History  Narrative   Gets reg exercise     FAMILY HISTORY:  We obtained a detailed, 4-generation family history.  Significant diagnoses are listed below: Family History  Problem Relation Age of Onset  . Hypertension Mother   . Arthritis Mother   . Stroke Mother   . Hypertension Father   . Diabetes Father   . Lung cancer Father   . Hypertension Brother   . Diabetes Brother   . Skin cancer Sister   . Prostate cancer Paternal Grandfather   . Leukemia Maternal Aunt   . Stomach cancer Maternal Grandfather    The patient has two children, two sisters and a brother.  One sister is a "sun worshipper" and has melanoma.  Her parents are both deceased.  Her father died of a stroke and had lung cancer and her mother died of a stroke.  Her mother had three sisters and two brothers.  One sister was diagnosed and died of leukemia at 84.  She  has two maternal cousins who were diagnosed with Hodgkins' lymphoma.  Her maternal grandfather possibly had stomach cancer.  Her father had one sister who is alive and well.  His parents died of prostate cancer and a heart attack. Patient's maternal ancestors are of [place] descent, and paternal ancestors are of [place] and [place] descent. There is no reported Ashkenazi Jewish ancestry. There is no known consanguinity.  GENETIC COUNSELING ASSESSMENT: Kirsten Schroeder is a 59 y.o. female with a personal history of ovarian cancer which somewhat suggestive of a hereditary cancer syndrome and predisposition to cancer. We, therefore, discussed and recommended the following at today's visit.   DISCUSSION: We reviewed the characteristics, features and inheritance patterns of hereditary cancer syndromes. We also discussed genetic testing, including the appropriate family members to test, the process of testing, insurance coverage and turn-around-time for results. We discussed the implications of a negative, positive and/or variant of uncertain significant result. We recommended Kirsten Schroeder  pursue genetic testing for the Breast/Ovarian cancer gene panel. The Breast/Ovarian gene panel offered by [facility] includes sequencing and rearrangement analysis for the following 20 genes:  ATM, BARD1, BRCA1, BRCA2, BRIP1, CDH1, CHEK2, EPCAM, FANCC, MLH1, MSH2, MSH6, NBN, PALB2, PMS2, PTEN, RAD51C, RAD51D, TP53, and XRCC2.     Based on Kirsten Schroeder's personal and family history of cancer, she meets medical criteria for genetic testing. Despite that she meets criteria, she may still have an out of pocket cost. We discussed that if her out of pocket cost for testing is over $100, the laboratory will call and confirm whether she wants to proceed with testing.  If the out of pocket cost of testing is less than $100 she will be billed by the genetic testing laboratory.   PLAN: After considering the risks, benefits, and limitations, Kirsten Schroeder  provided informed consent to pursue genetic testing and the blood sample was sent to [facility] for analysis of the Breast/Ovarian cancer panel. Results should be available within approximately 2-3 weeks' time, at which point they will be disclosed by telephone to Kirsten Schroeder, as will any additional recommendations warranted by these results. Kirsten Schroeder will receive a summary of her genetic counseling visit and a copy of her results once available. This information will also be available in Epic. We encouraged Kirsten Schroeder to remain in contact with cancer genetics annually so that we can continuously update the family history and inform her of any changes in cancer genetics and testing that may be of benefit for her family. Kirsten Schroeder's questions were answered to her satisfaction today. Our contact information was provided should additional questions or concerns arise.  Lastly, we encouraged Kirsten Schroeder to remain in contact with cancer genetics annually so that we can continuously update the family history and inform her of any changes in cancer genetics and testing that may be of  benefit for this family.   Ms.  Schroeder's questions were answered to her satisfaction today. Our contact information was provided should additional questions or concerns arise. Thank you for the referral and allowing [place] to share in the care of your patient.   Karen P. Florene Glen, [place], [facility] Certified Genetic Counselor [email or web address] phone: [phone]  The patient was seen for a total of 35 minutes in face-to-face genetic counseling.  This patient was discussed with Drs. Magrinat, Lindi Adie and/or Burr Medico who agrees with the above.    _______________________________________________________________________ For Office Staff:  Number of people involved in session: 2 Was an Intern/ student involved with case: no

## 2015-02-24 NOTE — Progress Notes (Signed)
  Radiation Oncology         [phone] ________________________________  Name: Kirsten Schroeder MRN: [id number]  Date: 02/19/2015  DOB: [birth year]  End of Treatment Note  ICD-9-CM ICD-10-CM    1. Ovarian cancer, unspecified laterality 183.0 C56.9     DIAGNOSIS: Stage III-C ovarian cancer, progressive on therapy     Indication for treatment:  Pelvic pain       Radiation treatment dates:   01/15/2015-02/19/2015  Site/dose:   Pelvic mass, 45 gray in 25 fractions  Beams/energy:   3-D conformal, multiple beams (4)  Narrative: The patient tolerated radiation treatment relatively well.   She experienced some fatigue in the course for treatment. Her pelvic pain appeared to improve overall  Plan: The patient has completed radiation treatment. The patient will return to radiation oncology clinic for routine followup in one month. I advised them to call or return sooner if they have any questions or concerns related to their recovery or treatment.  -----------------------------------  Blair Promise, PhD, MD

## 2015-02-27 ENCOUNTER — Other Ambulatory Visit: Payer: Self-pay | Admitting: *Deleted

## 2015-02-27 DIAGNOSIS — C772 Secondary and unspecified malignant neoplasm of intra-abdominal lymph nodes: Secondary | ICD-10-CM

## 2015-03-02 ENCOUNTER — Other Ambulatory Visit: Payer: Self-pay | Admitting: Oncology

## 2015-03-03 ENCOUNTER — Ambulatory Visit (HOSPITAL_BASED_OUTPATIENT_CLINIC_OR_DEPARTMENT_OTHER): Payer: BC Managed Care – PPO

## 2015-03-03 ENCOUNTER — Encounter: Payer: Self-pay | Admitting: Oncology

## 2015-03-03 ENCOUNTER — Telehealth: Payer: Self-pay | Admitting: Oncology

## 2015-03-03 ENCOUNTER — Ambulatory Visit (HOSPITAL_BASED_OUTPATIENT_CLINIC_OR_DEPARTMENT_OTHER): Payer: BC Managed Care – PPO | Admitting: Oncology

## 2015-03-03 VITALS — BP 124/57 | HR 99 | Temp 98.4°F | Resp 18 | Ht 62.0 in | Wt 116.3 lb

## 2015-03-03 DIAGNOSIS — C801 Malignant (primary) neoplasm, unspecified: Secondary | ICD-10-CM

## 2015-03-03 DIAGNOSIS — C774 Secondary and unspecified malignant neoplasm of inguinal and lower limb lymph nodes: Secondary | ICD-10-CM | POA: Diagnosis not present

## 2015-03-03 DIAGNOSIS — D649 Anemia, unspecified: Secondary | ICD-10-CM | POA: Diagnosis not present

## 2015-03-03 DIAGNOSIS — Z23 Encounter for immunization: Secondary | ICD-10-CM | POA: Diagnosis not present

## 2015-03-03 DIAGNOSIS — I890 Lymphedema, not elsewhere classified: Secondary | ICD-10-CM

## 2015-03-03 DIAGNOSIS — G620 Drug-induced polyneuropathy: Secondary | ICD-10-CM | POA: Diagnosis not present

## 2015-03-03 DIAGNOSIS — C569 Malignant neoplasm of unspecified ovary: Secondary | ICD-10-CM

## 2015-03-03 DIAGNOSIS — R300 Dysuria: Secondary | ICD-10-CM

## 2015-03-03 DIAGNOSIS — G893 Neoplasm related pain (acute) (chronic): Secondary | ICD-10-CM

## 2015-03-03 DIAGNOSIS — C772 Secondary and unspecified malignant neoplasm of intra-abdominal lymph nodes: Secondary | ICD-10-CM

## 2015-03-03 DIAGNOSIS — R634 Abnormal weight loss: Secondary | ICD-10-CM

## 2015-03-03 DIAGNOSIS — Z95828 Presence of other vascular implants and grafts: Secondary | ICD-10-CM

## 2015-03-03 DIAGNOSIS — T451X5A Adverse effect of antineoplastic and immunosuppressive drugs, initial encounter: Secondary | ICD-10-CM

## 2015-03-03 LAB — CBC WITH DIFFERENTIAL/PLATELET
BASO%: 0.6 % (ref 0.0–2.0)
Basophils Absolute: 0 10*3/uL (ref 0.0–0.1)
EOS%: 1 % (ref 0.0–7.0)
Eosinophils Absolute: 0.1 10*3/uL (ref 0.0–0.5)
HEMATOCRIT: 33.8 % — AB (ref 34.8–46.6)
HEMOGLOBIN: 11.3 g/dL — AB (ref 11.6–15.9)
LYMPH#: 0.4 10*3/uL — AB (ref 0.9–3.3)
LYMPH%: 6.6 % — ABNORMAL LOW (ref 14.0–49.7)
MCH: 29.6 pg (ref 25.1–34.0)
MCHC: 33.4 g/dL (ref 31.5–36.0)
MCV: 88.7 fL (ref 79.5–101.0)
MONO#: 0.7 10*3/uL (ref 0.1–0.9)
MONO%: 11.1 % (ref 0.0–14.0)
NEUT#: 4.9 10*3/uL (ref 1.5–6.5)
NEUT%: 80.7 % — ABNORMAL HIGH (ref 38.4–76.8)
Platelets: 270 10*3/uL (ref 145–400)
RBC: 3.81 10*6/uL (ref 3.70–5.45)
RDW: 15.6 % — AB (ref 11.2–14.5)
WBC: 6.1 10*3/uL (ref 3.9–10.3)

## 2015-03-03 LAB — COMPREHENSIVE METABOLIC PANEL (CC13)
ALK PHOS: 58 U/L (ref 40–150)
ALT: 9 U/L (ref 0–55)
AST: 19 U/L (ref 5–34)
Albumin: 3 g/dL — ABNORMAL LOW (ref 3.5–5.0)
Anion Gap: 7 mEq/L (ref 3–11)
BUN: 7.1 mg/dL (ref 7.0–26.0)
CALCIUM: 8.9 mg/dL (ref 8.4–10.4)
CHLORIDE: 102 meq/L (ref 98–109)
CO2: 27 mEq/L (ref 22–29)
CREATININE: 0.8 mg/dL (ref 0.6–1.1)
EGFR: 79 mL/min/{1.73_m2} — ABNORMAL LOW (ref >90)
GLUCOSE: 124 mg/dL (ref 70–140)
Potassium: 3.8 mEq/L (ref 3.5–5.1)
Sodium: 137 mEq/L (ref 136–145)
Total Bilirubin: 0.52 mg/dL (ref 0.20–1.20)
Total Protein: 5.9 g/dL — ABNORMAL LOW (ref 6.4–8.3)

## 2015-03-03 MED ORDER — MORPHINE SULFATE ER 30 MG PO TBCR: 30.0000 mg | EXTENDED_RELEASE_TABLET | Freq: Two times a day (BID) | ORAL | Status: DC

## 2015-03-03 MED ORDER — MORPHINE SULFATE ER 15 MG PO TBCR: 15.0000 mg | EXTENDED_RELEASE_TABLET | Freq: Two times a day (BID) | ORAL | Status: DC

## 2015-03-03 MED ORDER — LIDOCAINE VISCOUS 2 % MT SOLN: 20.0000 mL | OROMUCOSAL | Status: DC | PRN

## 2015-03-03 MED ORDER — INFLUENZA VAC SPLIT QUAD 0.5 ML IM SUSY
0.5000 mL | PREFILLED_SYRINGE | Freq: Once | INTRAMUSCULAR | Status: AC
  Administered 2015-03-03: 0.5 mL via INTRAMUSCULAR
  Filled 2015-03-03: qty 0.5

## 2015-03-03 MED ORDER — HEPARIN SOD (PORK) LOCK FLUSH 100 UNIT/ML IV SOLN
500.0000 [IU] | Freq: Once | INTRAVENOUS | Status: AC
  Administered 2015-03-03: 500 [IU] via INTRAVENOUS
  Filled 2015-03-03: qty 5

## 2015-03-03 MED ORDER — MORPHINE SULFATE 15 MG PO TABS: 15.0000 mg | ORAL_TABLET | ORAL | Status: DC | PRN

## 2015-03-03 MED ORDER — SODIUM CHLORIDE 0.9 % IJ SOLN
10.0000 mL | INTRAMUSCULAR | Status: DC | PRN
  Administered 2015-03-03: 10 mL via INTRAVENOUS
  Filled 2015-03-03: qty 10

## 2015-03-03 NOTE — Patient Instructions (Signed)

## 2015-03-03 NOTE — Progress Notes (Signed)
OFFICE PROGRESS NOTE   March 03, 2015   Physicians:ClarkePearson, Quillian Quince; Evalee Mutton, Marlene Bast, Floy Sabina, Annie Main.; Gery Pray.   INTERVAL HISTORY:  Patient is seen, together with daughter, in continuing attention to advanced serous gyn carcinoma, having completed radiation to pelvis 02-18-15. We plan repeat CT in 4-6 weeks from completion of radiation, with treatment break until then. She is to see Dr Sondra Come next on 03-19-15.  She saw Dr Diona Fanti ~ 02-21-15, culture without infection per patient.  Patient is fatigued but still able to do some limited home activities. She is not adequately comfortable on MS Contin 30 mg q 12 hrs, does use prn MSIR ~ 2x daily in addition, pain in rectal area. She is using desitin and sitz baths ~ 5x daily for irritation on perineum since radiation. She is having difficulty sleeping with the discomfort and is crying in pain at times per daughter. She has had no fever. She denies bleeding. Appetite is ~ same tho continues to lose weight.Swelling RLE stable.      PAC in, flushed today Right JJ stent Genetics testing.sent 02-19-15 and pending (Breast Ovarian panel by [facility]) CA 125 at diagnosis 5626  Flu vaccine given today     ONCOLOGIC HISTORY Patient presented with RLE swelling negative for DVT July 2014, and right hydronephrosis. Biopsy of left inguinal node ([id number]) found carcinoma consistent with serous gyn primary.Neoadjuvant dose dense taxol carboplatin began 01-31-13, with improvement in pelvic mass and adenopathy by scans after 3 cycles.. She had 3 additional cycles, with progressive decrease in CA 125 from 5626 in 12-2012 to 596 by Jan 2015. CT 06-14-13 had ~ stable pelvic mass and adenopathy. Dr Josephina Shih recommended another 3 cycles chemotherapy, regimen changed to q 3 week taxol carboplatin on 06-27-13, which unfortunately caused worsening of peripheral neuropathy and severe taxol aches.Taxotere was substituted for  taxol due to neuropathy in feet for cycles 8 and 9, completed 08-08-13. CA 125 improved to low of 494 in Feb 2015, then 732 just prior to cycle 9. CT AP 08-22-13 had minimal improvement, still not adequate to consider debulking surgery. She received doxil for 6 cycles, from 09-19-13 thru 02-06-14. CT AP 02-27-14 had some early increase in central pelvic mass, with treatment changed to gemzar beginning 03-29-14. CA 125 baseline for gemzar was 1932 on 03-27-14. Cycle 4 gemzar was given 05-22-14, with CA 125 2680. CDDP was added to gemzar beginning 06-05-14. She had cycle 5 CDDP gemzar on 09-04-14. Treatment changed to tamoxifen 09-30-14 due to poor tolerance of CDDP gemzar without clinical improvement. CT AP 12-13-14 compared with 06-2014 had some increase in the pelvic mass, but no new areas of involvement.Bowel movements via ostomy at baseline.    Review of systems as above, also: No increased SOB or cough. PAC ok.  Remainder of 10 point Review of Systems negative.  Objective:  Vital signs in last 24 hours:  BP 124/57 mmHg  Pulse 99  Temp(Src) 98.4 F (36.9 C) (Oral)  Resp 18  Ht _0  (1.575 m)  Wt 116 lb 4.8 oz (52.753 kg)  BMI 21.27 kg/m2  SpO2 98% Weight down 4 lbs. Alert, oriented and appropriate. Ambulatory without assistance. Respirations not labored. Looks chronically ill but not in acute discomfort, very pleasant as always.  HEENT:PERRL, sclerae not icteric. Oral mucosa moist without lesions, posterior pharynx clear.  Neck supple. No JVD.  Lymphatics:no cervical,supraclavicular or inguinal adenopathy Resp: clear to auscultation bilaterally and normal percussion bilaterally Cardio: regular rate and rhythm. No gallop.  GI: soft, nontender, not distended, no mass or organomegaly. Few bowel sounds. Surgical incision not remarkable. Ostomy. Perineum with desitin in place but no apparent "blisters" or desquamation, no significant swelling Musculoskeletal/ Extremities:RLE 1-2+ swelling, otherwise  without pitting edema, cords, tenderness Neuro: no change peripheral neuropathy. Otherwise nonfocal. Psych appropriate mood and affect Skin without rash, ecchymosis, petechiae Portacath-without erythema or tenderness  Lab Results:  Results for orders placed or performed in visit on 03/03/15  CBC with Differential  Result Value Ref Range   WBC 6.1 3.9 - 10.3 10e3/uL   NEUT# 4.9 1.5 - 6.5 10e3/uL   HGB 11.3 (L) 11.6 - 15.9 g/dL   HCT 33.8 (L) 34.8 - 46.6 %   Platelets 270 145 - 400 10e3/uL   MCV 88.7 79.5 - 101.0 fL   MCH 29.6 25.1 - 34.0 pg   MCHC 33.4 31.5 - 36.0 g/dL   RBC 3.81 3.70 - 5.45 10e6/uL   RDW 15.6 (H) 11.2 - 14.5 %   lymph# 0.4 (L) 0.9 - 3.3 10e3/uL   MONO# 0.7 0.1 - 0.9 10e3/uL   Eosinophils Absolute 0.1 0.0 - 0.5 10e3/uL   Basophils Absolute 0.0 0.0 - 0.1 10e3/uL   NEUT% 80.7 (H) 38.4 - 76.8 %   LYMPH% 6.6 (L) 14.0 - 49.7 %   MONO% 11.1 0.0 - 14.0 %   EOS% 1.0 0.0 - 7.0 %   BASO% 0.6 0.0 - 2.0 %  Comprehensive metabolic panel (Cmet) - [facility]  Result Value Ref Range   Sodium 137 136 - 145 mEq/L   Potassium 3.8 3.5 - 5.1 mEq/L   Chloride 102 98 - 109 mEq/L   CO2 27 22 - 29 mEq/L   Glucose 124 70 - 140 mg/dl   BUN 7.1 7.0 - 26.0 mg/dL   Creatinine 0.8 0.6 - 1.1 mg/dL   Total Bilirubin 0.52 0.20 - 1.20 mg/dL   Alkaline Phosphatase 58 40 - 150 U/L   AST 19 5 - 34 U/L   ALT 9 0 - 55 U/L   Total Protein 5.9 (L) 6.4 - 8.3 g/dL   Albumin 3.0 (L) 3.5 - 5.0 g/dL   Calcium 8.9 8.4 - 10.4 mg/dL   Anion Gap 7 3 - 11 mEq/L   EGFR 79 (L) >90 ml/min/1.73 m2     Studies/Results:  No results found.  Medications: I have reviewed the patient's current medications. Will increase MS Contin to 45 mg bid (30 mg + 15 mg bid), with MSIR also refilled. Will try mixing viscous lidocaine with desitin for perineum. Flu vaccine today   DISCUSSION: medication changes as above. Timing of next CT confirmed and I will see her back after thant scan. Likely best to drink contrast at  radiology. Dr Diona Fanti also interested in the upcoming scan from standpoint of stent.  Not discussed now, considerations for further treatment: single agent carbo, Alimta. She has not felt she could tolerate oral VP16. Not appropriate for avastin now. Benefit topotecan does not seem worth possible diarrhea. She would be Hospice appropriate if/ when stop aggressive treatment.  Assessment/Plan:  1.advanced serous gyn carcinoma: radiation to pelvic disease in palliative atempt recently completed, Dr Clabe Seal help appreciated. Increase pain medication and try viscous lidocaine with desitin. I will see her back shortly after repeat scans early Nov.  2.Right hydronephrosis with JJ stent: symptoms better with myrbetrig and the MS Contin. Dr Diona Fanti following.  3.history of ulcerative colitis post proctocolectomy with ileostomy remotely 4.previous UTIs related to stent, no symptoms presently  5..PAC in 6..peripheral neuropathy in feet related to taxanes: would not use further taxanes 7.allergy PCN 8..anemia likely multifactorial with chemo, blood draws, JJ stent. Improved off of chemo. Iron low normal on 09-04-14, continue oral iron as tolerated 9. Progressive weight loss: despite ongoing efforts 10. She does not have Advance Directives per EMR documentation.  11. Lymphedema RLE also some better likely from the radiation. 12. Flu vaccine done   All questions answered and patient and daughter are in agreement with recommendations and plans. Time spent 25 min including >50% counseling and coordination of care. Cc Dr Revonda Standard, MD   03/03/2015, 4:41 PM

## 2015-03-03 NOTE — Telephone Encounter (Signed)
Appointments made and avs printed for patient °

## 2015-03-04 ENCOUNTER — Telehealth: Payer: Self-pay

## 2015-03-04 LAB — CA 125: CA 125: 8511 U/mL — AB (ref <35)

## 2015-03-04 NOTE — Telephone Encounter (Signed)
-----   Message from Gordy Levan, MD sent at 03/04/2015 12:06 PM EDT ----- Viscous lidocaine, which I believe is 2% but may be 1%  One bottle  2 RF Mix with desitin for perineum  thanks

## 2015-03-04 NOTE — Telephone Encounter (Signed)
Kirsten Schroeder is using the desitin and viscous lidocaine in 1:1 ratio as directed and applying to the perineum. She states that it is helping with the discomfort from radiation.

## 2015-03-05 ENCOUNTER — Telehealth: Payer: Self-pay | Admitting: Genetic Counselor

## 2015-03-05 ENCOUNTER — Encounter: Payer: Self-pay | Admitting: Genetic Counselor

## 2015-03-05 DIAGNOSIS — Z1379 Encounter for other screening for genetic and chromosomal anomalies: Secondary | ICD-10-CM | POA: Insufficient documentation

## 2015-03-05 NOTE — Telephone Encounter (Signed)
Revealed positive BRCA2 pathogenic mutation found on the Breast/Ovarian cancer panel.  Patient will come in with her daughter on Friday 10/14.

## 2015-03-07 ENCOUNTER — Encounter: Payer: Self-pay | Admitting: Genetic Counselor

## 2015-03-07 ENCOUNTER — Ambulatory Visit (HOSPITAL_BASED_OUTPATIENT_CLINIC_OR_DEPARTMENT_OTHER): Payer: BC Managed Care – PPO | Admitting: Genetic Counselor

## 2015-03-07 DIAGNOSIS — Z315 Encounter for genetic counseling: Secondary | ICD-10-CM | POA: Diagnosis not present

## 2015-03-07 DIAGNOSIS — Z1509 Genetic susceptibility to other malignant neoplasm: Secondary | ICD-10-CM

## 2015-03-07 DIAGNOSIS — Z1501 Genetic susceptibility to malignant neoplasm of breast: Secondary | ICD-10-CM

## 2015-03-07 DIAGNOSIS — C569 Malignant neoplasm of unspecified ovary: Secondary | ICD-10-CM | POA: Diagnosis not present

## 2015-03-07 DIAGNOSIS — Z1379 Encounter for other screening for genetic and chromosomal anomalies: Secondary | ICD-10-CM

## 2015-03-07 NOTE — Progress Notes (Signed)
GENETIC TEST RESULTS   Patient Name: Kirsten Schroeder Patient Age: 59 y.o. Encounter Date: 03/07/2015  Referring Provider: Evlyn Clines, MD    Kirsten Schroeder was seen in the [place] clinic on March 07, 2015 due to a personal history of ovarian cancer and concern regarding a hereditary predisposition to cancer in the family. Please refer to the prior Genetics clinic note for more information regarding Kirsten Schroeder's medical and family histories and our assessment at the time.   GENETIC TESTING:  At the time of Kirsten Schroeder's visit, we recommended she pursue genetic testing of the Breast/Ovarian cancer gene panel.  The Breast/Ovarian gene panel offered by [facility] includes sequencing and rearrangement analysis for the following 20 genes:  ATM, BARD1, BRCA1, BRCA2, BRIP1, CDH1, CHEK2, EPCAM, FANCC, MLH1, MSH2, MSH6, NBN, PALB2, PMS2, PTEN, RAD51C, RAD51D, TP53, and XRCC2.   The report date was March 04, 2015.  Testing revealed a mutation in the BRCA2 gene called [id number].  MEDICAL MANAGEMENT: Women who have a BRCA mutation have an increased risk for both breast and ovarian cancer.   As discussed with Kirsten Schroeder, to reduce the risk for breast cancer, prophylactic bilateral mastectomy is the most effective option. However, for women who choose to keep their breasts, we recommend yearly mammograms, yearly breast MRI, twice-yearly clinical breast exams through a high-risk clinic, and monthly self-breast exams.   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  We discussed that some chemotherapy is slated for women with BRCA mutations.  I could not tell her if she qualified for this treatment, and explained that BRCA mutations is only one part of the criteria.  She will discuss with Dr. Marko Plume whether she would be eligible for this chemotherapy or not.   RISK REDUCTION: There are several things that can be offered to individuals who are carriers for BRCA mutations that will reduce the risk for  getting cancer.   The use of oral contraceptives can lower the risk for ovarian cancer, and, per case control studies, does not significantly increase the risk for breast cancer in BRCA2 patients. Case control studies have shown that oral contraceptives can lower the risk for ovarian cancer in women with BRCA mutations. Additionally, a more recent meta-analysis, including one corhort (n=3,181) and one case control study (1,096 cases and 2,878 controls) also showed an inverse correlation between ovarian cancer and ever having used oral contraceptives (OR, 0.58; 95% CI = 0.46-0.73). Studies on oral contraceptives and breast cancer have been conflicting, with some studies suggesting that there is not an increased risk for breast cancer in BRCA mutation carriers, while others suggest that there could be a risk. That said, two meta-analysis studies have shown that there is not an increased risk for breast cancer with oral contraceptive use in BRCA1 and BRCA2 carriers.   In individuals who have a prophylactic bilateral salpino-oophorectomy (BSO), the risk for breast cancer is reduced by up to 50%. It has been reported that short term hormone replacement therapy in women undergoing prophylactic BSO does not negate the reduction of breast cancer risk associated with surgery (2.2016 NCCN guidelines).  For men who harbor BRCA mutations, the general risk for cancer seems to be only slightly increased. As we discussed, BRCA mutations confer a slightly increased risk (about 7%) for breast cancer in men. With regard to screening for other types of cancer, the risk for prostate cancer is increased over that of the general population risk and that screening should commence at age 37.  We suggest adhering faithfully to the general population screening recommendations including annual colon cancer screening starting at the age of 81 and prostate cancer screening if you and your physician decide to pursue that.  FAMILY  MEMBERS: It is important that all of Kirsten Schroeder's relatives (both men and women) know of the presence of this gene mutation. Site-specific genetic testing can sort out who in the family is at risk and who is not.   Kirsten Schroeder's children and siblings have a 50% chance to have inherited this mutation. We recommend they have genetic testing for this same mutation, as identifying the presence of this mutation would allow them to also take advantage of risk-reducing measures.   SUPPORT AND RESOURCES: If Kirsten Schroeder is interested in BRCA-specific information and support, there are two groups, Facing Our Risk (www.facingourrisk.com) and Bright Pink (www.brightpink.org) which some people have found useful. They provide opportunities to speak with other individuals from high-risk families.  To locate genetic counselors in other cities, visit the website of the [employer] (ArtistMovie.se) and Secretary/administrator for a Social worker by zip code. We did provide information on how to contact a Dietitian in the [place], [place] area for Kirsten Schroeder's son.  Additionally, we provided Kirsten Schroeder information about the informational meeting for the Hereditary Support Group.  We encouraged Kirsten Schroeder to remain in contact with [place] on an annual basis so we can update her personal and family histories, and let her know of advances in cancer genetics that may benefit the family. Our contact number was provided. Kirsten Schroeder's questions were answered to her satisfaction today, and she knows she is welcome to call anytime with additional questions.   Djibril Glogowski P. Florene Glen, [place], [facility] Certified Genetic Counselor [email or web address] phone: [phone]

## 2015-03-18 ENCOUNTER — Encounter: Payer: Self-pay | Admitting: Oncology

## 2015-03-19 ENCOUNTER — Encounter: Payer: Self-pay | Admitting: Radiation Oncology

## 2015-03-19 ENCOUNTER — Ambulatory Visit
Admission: RE | Admit: 2015-03-19 | Discharge: 2015-03-19 | Disposition: A | Discharge status: Home / Self Care | Payer: BC Managed Care – PPO | Source: Ambulatory Visit | Attending: Radiation Oncology | Admitting: Radiation Oncology

## 2015-03-19 VITALS — BP 107/56 | HR 91 | Temp 98.4°F | Ht 62.0 in | Wt 116.0 lb

## 2015-03-19 DIAGNOSIS — C569 Malignant neoplasm of unspecified ovary: Secondary | ICD-10-CM

## 2015-03-19 NOTE — Progress Notes (Signed)
Ms. Kirsten Schroeder is here for follow-up of radiation to her pelvic mass completed 02/19/2015.  She had several lesions/sores to her vaginal area which she was using lidocaine/desitin for. She reports the lesions improved remarkably and she is not using the cream anymore. She is taking 45mg  MSContin twice a day with relief of her vaginal pain, and she reports not taking any of her MSIR breakthrough pain medicine except when she is planning on doing some walking. She has an ileostomy that is working well according to [name]. She reports some burning with urination which she relates to her vaginal sores. She is sleeping well, and states her appetite is improving slowly.   BP 107/56 mmHg  Pulse 91  Temp(Src) 98.4 F (36.9 C)  Ht 5\' 2"  (1.575 m)  Wt 116 lb (52.617 kg)  BMI 21.21 kg/m2   Wt Readings from Last 3 Encounters:  03/19/15 116 lb (52.617 kg)  03/03/15 116 lb 4.8 oz (52.753 kg)  02/18/15 120 lb 4.8 oz (54.568 kg)

## 2015-03-19 NOTE — Progress Notes (Signed)
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 Kirsten Schroeder is here for follow-up of radiation to her pelvic mass completed 02/19/2015. She had several lesions/sores to her vaginal area which she was using lidocaine/desitin for. She reports the lesions improved remarkably and she is not using the cream anymore. She is taking 45mg  MSContin twice a day with relief of her vaginal pain, and she reports not taking any of her MSIR breakthrough pain medicine except when she is planning on doing some walking. She has an ileostomy that is working well according to Ms. Troy. She reports some burning with urination which she relates to her vaginal sores. She is sleeping well, and states her appetite is improving slowly.                               ALLERGIES:  is allergic to penicillins.  Meds: Current Outpatient Prescriptions  Medication Sig Dispense Refill  . Calcium Carbonate-Vitamin D (CALCIUM + D PO) Take 1 tablet by mouth daily.     . Fe Fum-FA-B Cmp-C-Zn-Mg-Mn-Cu (FERROCITE PLUS) 106-1 MG TABS Take 1 tablet by mouth daily. 30 each 5  . lidocaine-prilocaine (EMLA) cream Apply 1 application topically as needed.    . mirabegron ER (MYRBETRIQ) 50 MG TB24 tablet Take 50 mg by mouth daily.    Marland Kitchen morphine (MS CONTIN) 15 MG 12 hr tablet Take 1 tablet (15 mg total) by mouth every 12 (twelve) hours. To equal total 45 mg dose of MS Contin 60 tablet 0  . morphine (MS CONTIN) 30 MG 12 hr tablet Take 1 tablet (30 mg total) by mouth every 12 (twelve) hours. 60 tablet 0  . morphine (MSIR) 15 MG tablet Take 1-2 tablets (15-30 mg total) by mouth every 4 (four)  hours as needed for severe pain. 60 tablet 0  . Multiple Vitamin (MULTIVITAMIN WITH MINERALS) TABS tablet Take 1 tablet by mouth daily.    . ondansetron (ZOFRAN) 8 MG tablet Take 1-2 tablets (8-16 mg total) by mouth every 12 (twelve) hours as needed for nausea. 30 tablet 2  . oxybutynin (DITROPAN) 5 MG tablet Take 1 tablet (5 mg total) by mouth 3 (three) times daily. 90 tablet 3  . sodium chloride (OCEAN) 0.65 % nasal spray Place 1 spray into the nose as needed. Use every 1-3 hours while awake to keep nose moist, which helps to prevent nosebleeds    . acyclovir (ZOVIRAX) 200 MG capsule Take 1 capsule (200 mg total) by mouth 3 (three) times daily as needed. For fever blister (Patient not taking: Reported on 03/03/2015) 30 capsule 1  . diazepam (VALIUM) 2 MG tablet Take 1 mg by mouth daily as needed for anxiety.     . lidocaine (XYLOCAINE) 2 % solution as needed. Mix lidocaine 1:1 with Desitin and apply to perineum as directed.    Marland Kitchen LORazepam (ATIVAN) 0.5 MG tablet Take 1 tablet (0.5 mg total) by mouth every 6 (six) hours as needed (for nausea). (Patient not taking: Reported on 02/03/2015) 30 tablet 0   No current facility-administered medications  for this encounter.   Facility-Administered Medications Ordered in Other Encounters  Medication Dose Route Frequency Provider Last Rate Last Dose  . sodium chloride 0.9 % injection 10 mL  10 mL Intravenous PRN Lennis Marion Downer, MD   10 mL at 02/19/13 4540    Physical Findings: The patient is in no acute distress. Patient is alert and oriented.  height is 5\' 2"  (1.575 m) and weight is 116 lb (52.617 kg). Her temperature is 98.4 F (36.9 C). Her blood pressure is 107/56 and her pulse is 91. .  No significant changes. No palpable cervical, supraclavicular or axillary lymphoadenopathy. The heart has a regular rate and rhythm. The lungs are clear to auscultation. Abdomen is soft and non-tender.  Lab Findings: Lab Results  Component Value Date   WBC 6.1  03/03/2015   HGB 11.3* 03/03/2015   HCT 33.8* 03/03/2015   MCV 88.7 03/03/2015   PLT 270 03/03/2015    Radiographic Findings: No results found.  Impression:  The patient is recovering from the effects of radiation. She is doing better with less pain.  Plan:  CT scan Nov. 1st, continue follow up with Dr.[name]. PRN follow up with radiation oncology.   -----------------------------------  Blair Promise, PhD, MD  This document serves as a record of services personally performed by Gery Pray, MD. It was created on his behalf by Derek Mound, a trained medical scribe. The creation of this record is based on the scribe's personal observations and the provider's statements to them. This document has been checked and approved by the attending provider.

## 2015-03-25 ENCOUNTER — Encounter (HOSPITAL_COMMUNITY): Payer: Self-pay

## 2015-03-25 ENCOUNTER — Ambulatory Visit (HOSPITAL_COMMUNITY)
Admission: RE | Admit: 2015-03-25 | Discharge: 2015-03-25 | Disposition: A | Discharge status: Home / Self Care | Payer: BC Managed Care – PPO | Source: Ambulatory Visit | Attending: Oncology | Admitting: Oncology

## 2015-03-25 DIAGNOSIS — C7800 Secondary malignant neoplasm of unspecified lung: Secondary | ICD-10-CM | POA: Diagnosis not present

## 2015-03-25 DIAGNOSIS — C772 Secondary and unspecified malignant neoplasm of intra-abdominal lymph nodes: Secondary | ICD-10-CM | POA: Insufficient documentation

## 2015-03-25 DIAGNOSIS — N1330 Unspecified hydronephrosis: Secondary | ICD-10-CM | POA: Insufficient documentation

## 2015-03-25 DIAGNOSIS — Z933 Colostomy status: Secondary | ICD-10-CM | POA: Insufficient documentation

## 2015-03-25 DIAGNOSIS — C569 Malignant neoplasm of unspecified ovary: Secondary | ICD-10-CM | POA: Insufficient documentation

## 2015-03-25 DIAGNOSIS — Z08 Encounter for follow-up examination after completed treatment for malignant neoplasm: Secondary | ICD-10-CM | POA: Insufficient documentation

## 2015-03-25 DIAGNOSIS — C787 Secondary malignant neoplasm of liver and intrahepatic bile duct: Secondary | ICD-10-CM | POA: Diagnosis not present

## 2015-03-25 DIAGNOSIS — K435 Parastomal hernia without obstruction or  gangrene: Secondary | ICD-10-CM | POA: Insufficient documentation

## 2015-03-25 MED ORDER — IOHEXOL 300 MG/ML  SOLN
50.0000 mL | Freq: Once | INTRAMUSCULAR | Status: AC | PRN
  Administered 2015-03-25: 50 mL via ORAL

## 2015-03-25 MED ORDER — IOHEXOL 300 MG/ML  SOLN
100.0000 mL | Freq: Once | INTRAMUSCULAR | Status: AC | PRN
  Administered 2015-03-25: 100 mL via INTRAVENOUS

## 2015-03-28 ENCOUNTER — Other Ambulatory Visit: Payer: Self-pay

## 2015-03-28 DIAGNOSIS — C772 Secondary and unspecified malignant neoplasm of intra-abdominal lymph nodes: Secondary | ICD-10-CM

## 2015-03-30 ENCOUNTER — Other Ambulatory Visit: Payer: Self-pay | Admitting: Oncology

## 2015-03-31 ENCOUNTER — Telehealth: Payer: Self-pay | Admitting: Oncology

## 2015-03-31 ENCOUNTER — Encounter: Payer: Self-pay | Admitting: General Practice

## 2015-03-31 ENCOUNTER — Other Ambulatory Visit (HOSPITAL_BASED_OUTPATIENT_CLINIC_OR_DEPARTMENT_OTHER): Payer: BC Managed Care – PPO

## 2015-03-31 ENCOUNTER — Ambulatory Visit (HOSPITAL_BASED_OUTPATIENT_CLINIC_OR_DEPARTMENT_OTHER): Payer: BC Managed Care – PPO | Admitting: Oncology

## 2015-03-31 ENCOUNTER — Ambulatory Visit (HOSPITAL_BASED_OUTPATIENT_CLINIC_OR_DEPARTMENT_OTHER): Payer: BC Managed Care – PPO

## 2015-03-31 ENCOUNTER — Encounter: Payer: Self-pay | Admitting: Oncology

## 2015-03-31 VITALS — BP 115/67 | HR 89 | Temp 98.6°F | Resp 18 | Ht 62.0 in | Wt 115.4 lb

## 2015-03-31 DIAGNOSIS — C569 Malignant neoplasm of unspecified ovary: Secondary | ICD-10-CM

## 2015-03-31 DIAGNOSIS — G893 Neoplasm related pain (acute) (chronic): Secondary | ICD-10-CM

## 2015-03-31 DIAGNOSIS — R300 Dysuria: Secondary | ICD-10-CM | POA: Diagnosis not present

## 2015-03-31 DIAGNOSIS — C772 Secondary and unspecified malignant neoplasm of intra-abdominal lymph nodes: Secondary | ICD-10-CM | POA: Diagnosis not present

## 2015-03-31 DIAGNOSIS — C801 Malignant (primary) neoplasm, unspecified: Secondary | ICD-10-CM

## 2015-03-31 DIAGNOSIS — R634 Abnormal weight loss: Secondary | ICD-10-CM | POA: Diagnosis not present

## 2015-03-31 DIAGNOSIS — C787 Secondary malignant neoplasm of liver and intrahepatic bile duct: Secondary | ICD-10-CM

## 2015-03-31 DIAGNOSIS — C7802 Secondary malignant neoplasm of left lung: Secondary | ICD-10-CM

## 2015-03-31 DIAGNOSIS — C7801 Secondary malignant neoplasm of right lung: Secondary | ICD-10-CM

## 2015-03-31 LAB — CBC WITH DIFFERENTIAL/PLATELET
BASO%: 0.3 % (ref 0.0–2.0)
Basophils Absolute: 0 10*3/uL (ref 0.0–0.1)
EOS%: 1.8 % (ref 0.0–7.0)
Eosinophils Absolute: 0.1 10*3/uL (ref 0.0–0.5)
HCT: 34.3 % — ABNORMAL LOW (ref 34.8–46.6)
HGB: 11.1 g/dL — ABNORMAL LOW (ref 11.6–15.9)
LYMPH#: 0.4 10*3/uL — AB (ref 0.9–3.3)
LYMPH%: 6.5 % — AB (ref 14.0–49.7)
MCH: 28.6 pg (ref 25.1–34.0)
MCHC: 32.2 g/dL (ref 31.5–36.0)
MCV: 88.7 fL (ref 79.5–101.0)
MONO#: 0.5 10*3/uL (ref 0.1–0.9)
MONO%: 8.7 % (ref 0.0–14.0)
NEUT%: 82.7 % — AB (ref 38.4–76.8)
NEUTROS ABS: 5.1 10*3/uL (ref 1.5–6.5)
Platelets: 292 10*3/uL (ref 145–400)
RBC: 3.87 10*6/uL (ref 3.70–5.45)
RDW: 16.3 % — ABNORMAL HIGH (ref 11.2–14.5)
WBC: 6.2 10*3/uL (ref 3.9–10.3)

## 2015-03-31 LAB — COMPREHENSIVE METABOLIC PANEL (CC13)
ALT: 13 U/L (ref 0–55)
AST: 33 U/L (ref 5–34)
Albumin: 2.8 g/dL — ABNORMAL LOW (ref 3.5–5.0)
Alkaline Phosphatase: 87 U/L (ref 40–150)
Anion Gap: 8 mEq/L (ref 3–11)
BUN: 8.3 mg/dL (ref 7.0–26.0)
CHLORIDE: 102 meq/L (ref 98–109)
CO2: 25 meq/L (ref 22–29)
CREATININE: 0.9 mg/dL (ref 0.6–1.1)
Calcium: 8.9 mg/dL (ref 8.4–10.4)
EGFR: 68 mL/min/{1.73_m2} — ABNORMAL LOW (ref >90)
GLUCOSE: 108 mg/dL (ref 70–140)
Potassium: 3.8 mEq/L (ref 3.5–5.1)
SODIUM: 135 meq/L — AB (ref 136–145)
TOTAL PROTEIN: 5.9 g/dL — AB (ref 6.4–8.3)
Total Bilirubin: 0.39 mg/dL (ref 0.20–1.20)

## 2015-03-31 MED ORDER — MORPHINE SULFATE ER 15 MG PO TBCR: 15.0000 mg | EXTENDED_RELEASE_TABLET | Freq: Two times a day (BID) | ORAL | Status: DC

## 2015-03-31 MED ORDER — HEPARIN SOD (PORK) LOCK FLUSH 100 UNIT/ML IV SOLN
500.0000 [IU] | Freq: Once | INTRAVENOUS | Status: AC
  Administered 2015-03-31: 500 [IU] via INTRAVENOUS
  Filled 2015-03-31: qty 5

## 2015-03-31 MED ORDER — SODIUM CHLORIDE 0.9 % IJ SOLN
10.0000 mL | INTRAMUSCULAR | Status: DC | PRN
  Administered 2015-03-31: 10 mL via INTRAVENOUS
  Filled 2015-03-31: qty 10

## 2015-03-31 NOTE — Patient Instructions (Signed)

## 2015-03-31 NOTE — Progress Notes (Signed)
OFFICE PROGRESS NOTE   March 31, 2015   Physicians:ClarkePearson, Quillian Quince; Evalee Mutton, Marlene Bast, Floy Sabina, Annie Main.; Gery Pray.   INTERVAL HISTORY:   Patient is seen, together with daughter, having completed palliative radiation to pelvis 02-19-15 which was helpful with symptoms, but unfortunately now with progression by CT 03-25-15 to lung and liver. She has prn follow up now with Dr Sondra Come.  Pain is controlled with present medications (MSContin 45 mg q 12 hrs and prn MSIR). She is fatigued, but able to do self care and some limited activities at home. Appetite is not very good tho she does eat and drink fluids, no vomiting and no significant nausea. She denies SOB with present activity level, no cough or chest pain. Swelling RLE no worse. No different bladder or stent symptoms. No overt bleeding. The vaginal irritation from radiation has resolved. Slight rough rash not pruritic on extremities for last 24 hours. No fever or symptoms of infection. No problems with PAC. Remainder of 10 point Review of Systems negative/ unchanged.  PAC in, flushed 03-31-15 Right JJ stent Genetics testing.sent 02-19-15 and revealed a mutation in the BRCA2 gene called c.7618-1G>A. (BreastOvarian panel by [facility]) CA 125 at diagnosis 5626  Flu vaccine 03-03-15    ONCOLOGIC HISTORY  Patient presented with RLE swelling negative for DVT July 2014, and right hydronephrosis. Biopsy of left inguinal node ([id number]) found carcinoma consistent with serous gyn primary.Neoadjuvant dose dense taxol carboplatin began 01-31-13, with improvement in pelvic mass and adenopathy by scans after 3 cycles.. She had 3 additional cycles, with progressive decrease in CA 125 from 5626 in 12-2012 to 596 by Jan 2015. CT 06-14-13 had ~ stable pelvic mass and adenopathy. Dr Josephina Shih recommended another 3 cycles chemotherapy, regimen changed to q 3 week taxol carboplatin on 06-27-13, which unfortunately caused  worsening of peripheral neuropathy and severe taxol aches.Taxotere was substituted for taxol due to neuropathy in feet for cycles 8 and 9, completed 08-08-13. CA 125 improved to low of 494 in Feb 2015, then 732 just prior to cycle 9. CT AP 08-22-13 had minimal improvement, still not adequate to consider debulking surgery. She received doxil for 6 cycles, from 09-19-13 thru 02-06-14. CT AP 02-27-14 had some early increase in central pelvic mass, with treatment changed to gemzar beginning 03-29-14. CA 125 baseline for gemzar was 1932 on 03-27-14. Cycle 4 gemzar was given 05-22-14, with CA 125 2680. CDDP was added to gemzar beginning 06-05-14. She had cycle 5 CDDP gemzar on 09-04-14. Treatment changed to tamoxifen 09-30-14 due to poor tolerance of CDDP gemzar without clinical improvement. CT AP 12-13-14 compared with 06-2014 had some increase in the pelvic mass, but no new areas of involvement. Radiation treatment dates: 01/15/2015-02/19/2015 Site/dose: Pelvic mass, 45 gray in 25 fractions. CT 03-25-15 had new lung and liver metastases.  Objective:  Vital signs in last 24 hours:  BP 115/67 mmHg  Pulse 89  Temp(Src) 98.6 F (37 C) (Oral)  Resp 18  Ht 5' 2"  (1.575 m)  Wt 115 lb 6.4 oz (52.345 kg)  BMI 21.10 kg/m2  SpO2 99% Appears chronically ill, pale but not icteric, not in acute physical discomfort. Alert, oriented and appropriate. Ambulatory without assistance.  Alopecia  HEENT:PERRL, sclerae not icteric. Oral mucosa moist without lesions, posterior pharynx clear.  Neck supple. No JVD.  Lymphatics:no supraclavicular or inguinal adenopathy Resp: clear to auscultation bilaterally and normal percussion bilaterally Cardio: regular rate and rhythm. No gallop. GI: soft, nontender, not distended. Some bowel sounds.Ostomy functioning. Surgical  incision not remarkable. Musculoskeletal/ Extremities: RLE 1-2+ swelling as baseline, not tight, no cords or tenderness. LLE without pitting edema, cords,  tenderness Neuro: no change peripheral neuropathy. Otherwise nonfocal. PSYCH crying and very emotional thru much of visit related to information from scan Skin dry, slight rough sandpaper like rash extremities, no ecchymosis or petechiae Portacath-without erythema or tenderness  Lab Results:  Results for orders placed or performed in visit on 03/31/15  CBC with Differential  Result Value Ref Range   WBC 6.2 3.9 - 10.3 10e3/uL   NEUT# 5.1 1.5 - 6.5 10e3/uL   HGB 11.1 (L) 11.6 - 15.9 g/dL   HCT 34.3 (L) 34.8 - 46.6 %   Platelets 292 145 - 400 10e3/uL   MCV 88.7 79.5 - 101.0 fL   MCH 28.6 25.1 - 34.0 pg   MCHC 32.2 31.5 - 36.0 g/dL   RBC 3.87 3.70 - 5.45 10e6/uL   RDW 16.3 (H) 11.2 - 14.5 %   lymph# 0.4 (L) 0.9 - 3.3 10e3/uL   MONO# 0.5 0.1 - 0.9 10e3/uL   Eosinophils Absolute 0.1 0.0 - 0.5 10e3/uL   Basophils Absolute 0.0 0.0 - 0.1 10e3/uL   NEUT% 82.7 (H) 38.4 - 76.8 %   LYMPH% 6.5 (L) 14.0 - 49.7 %   MONO% 8.7 0.0 - 14.0 %   EOS% 1.8 0.0 - 7.0 %   BASO% 0.3 0.0 - 2.0 %  Comprehensive metabolic panel (Cmet) - [facility]  Result Value Ref Range   Sodium 135 (L) 136 - 145 mEq/L   Potassium 3.8 3.5 - 5.1 mEq/L   Chloride 102 98 - 109 mEq/L   CO2 25 22 - 29 mEq/L   Glucose 108 70 - 140 mg/dl   BUN 8.3 7.0 - 26.0 mg/dL   Creatinine 0.9 0.6 - 1.1 mg/dL   Total Bilirubin 0.39 0.20 - 1.20 mg/dL   Alkaline Phosphatase 87 40 - 150 U/L   AST 33 5 - 34 U/L   ALT 13 0 - 55 U/L   Total Protein 5.9 (L) 6.4 - 8.3 g/dL   Albumin 2.8 (L) 3.5 - 5.0 g/dL   Calcium 8.9 8.4 - 10.4 mg/dL   Anion Gap 8 3 - 11 mEq/L   EGFR 68 (L) >90 ml/min/1.73 m2   last CA 125 from 03-03-15 up to 8511  Studies/Results:   EXAM: CT ABDOMEN AND PELVIS WITH CONTRAST   03-25-15  COMPARISON: 12/13/2014  FINDINGS: Lower chest: Development of bilateral pulmonary nodules, consistent with metastatic disease. Example 9 mm right lower lobe pulmonary nodule (image 5, series 4). A left lower lobe index pulmonary  nodule measures 7 mm (image 4, series 4). Normal heart size without pericardial or pleural effusion.  Hepatobiliary: Development of extensive hepatic metastasis. Index inferior right hepatic lobe lesion measures 2.8 x 2.6 cm (image 23, series 2).  An index left hepatic lobe lesion measures 1.9 cm on image 9. Cholecystectomy, without biliary ductal dilatation.  Pancreas: Normal, without mass or ductal dilatation.  Spleen: Normal in size, without focal abnormality.  Adrenals/Urinary Tract: Normal adrenal glands. Normal left kidney. Interpolar right renal cyst anteriorly. Mild right-sided hydronephrosis remains. Right ureteric stent which originates in the right renal pelvis and terminates in the urinary bladder. Renal pelvic wall thickening and mucosal hyper enhancement are mild, similar. Nonspecific in the setting of ureteric stent. The urinary bladder also demonstrates mucosal hyperenhancement with surrounding edema. Grossly similar.  Stomach/Bowel: Normal stomach, without wall thickening. Ascending colostomy. Small bowel containing parastomal hernia, without  obstruction. Small bowel otherwise unremarkable.  Vascular/Lymphatic: Aortic and branch vessel atherosclerosis. Retroperitoneal nodes. A pre caval index node measures 8 mm (image 42, series 2). 5 mm on the prior.  An aortocaval index node measures 5 mm (image 33, series 2). Decreased from a 7 mm on the prior. Necrotic nodes in the ileocolic mesentery are new. Example at 1.0 cm (image 48, series 2).  Reproductive: High central right pelvic mass (which is intimately associated with versus arising from the uterine fundus). This measures 6.9 x 6.8 cm (image 58, series 2). 7.4 x 7.0 cm at the same level on the prior. New or increased endometrial fluid, including on image 62. Question soft tissue fullness within the uterine cervix, similar (image 68, series 2).  Other: No significant free  fluid.  Musculoskeletal: No acute osseous abnormality.  IMPRESSION: 1. Development of extensive hepatic and pulmonary metastasis. 2. Nodal metastasis are overall slightly progressive. Although some nodes measure slightly smaller, there are new nodes in the ileocolic mesentery with central necrosis. 3. Similar to minimal decrease in size of a high right pelvic soft tissue mass. New or increased endometrial fluid, indeterminate. Possible concurrent cervical soft tissue fullness, similar. 4. Right ureteric stent in place with similar mild right-sided hydronephrosis. Renal pelvic and bladder wall thickening with mucosal hyper enhancement may be within normal variation with stent in place. Cannot exclude concurrent urinary tract infection. 5. 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 6. Advanced atherosclerosis for age.  Medications: I have reviewed the patient's current medications. Needed MSContin refill, not MSIR yet.   DISCUSSION: We have discussed symptomatic improvement from radiation which is continuing, and we are very appreciative of this. I have told patient and daughter about significant new metastatic disease involving liver and lungs, this information extremely upsetting to both of them. I have told them that we have really reached maximum benefit from chemotherapy, as further aggressive treatment  would most likely worsen her quality of life significantly. She has been heavily treated, so options for chemo are not good and I do not expect that any chemo now would benefit situation overall. They find this information difficult to hear, tho patient agrees that recent attempts at chemo have been very hard for her. I have told patient and daughter that their efforts and determination have truly made a difference to this point, and that this progression is not because of any omissions in care. Daughter mentions oral hormone blocker. I have talked with  them about Advance Directives; patient is to meet with attorney in near future to complete, would not want to be maintained on life support in irreversible situation. I have explained that life support would not improve the underlying cancer and would add to suffering for patient and family.  I have offered assistance from [facility] to complete these if that is more convenient. We have discussed hospice referral, which I have encouraged them to consider, for both nursing and emotional support, as patient and daughter are extremely close. I have explained that Hospice works closely with other physicians and that she can still be seen at this office however that is helpful. [place] chaplain met with patient and daughter at length after my viist.  She will try using lotion liberally for dry skin, which may be the cause of rash.   Assessment/Plan: 1. 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 Quality of life  expected to be better now with strict palliative care. Referral to Hospice if patient agrees. I will see her back in ~ 2 weeks also. 2.Right hydronephrosis with JJ stent: symptoms better with myrbetrig and the MS Contin. Dr Diona Fanti following.  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 Improved off of chemo. Iron low normal on 09-04-14, continue oral iron as tolerated 9. Progressive weight loss: despite ongoing efforts, related to metastatic cancer. 10. Advance Directives being addressed. She would not want to be on life support in irreversible situation.  11. Lymphedema RLE also some better likely from the radiation. 12. Flu vaccine  03-03-15 13.rash likely dry skin, as above.   Time spent 35 min including >50% counseling and coordination of care. They know to call if needed prior to follow up visit in near future.  Cc Dr Diona Fanti. RN will follow up by phone including rash tomorrow.  [name],LENNIS P, MD   03/31/2015, 7:35 PM

## 2015-03-31 NOTE — Telephone Encounter (Signed)
Appointments made and avs printed for patient °

## 2015-03-31 NOTE — Progress Notes (Signed)
Spiritual Care Note  Met with Ms Hove and daughter Estill Bamberg per referral from Dr Marko Plume and Baruch Merl, RN.  Pt and dtr were tearful at news of mets and talk of hospice.  Estill Bamberg was blindsided; Ms Ocampo indicated that she had an inward sense that her disease was progressing.  They appear to have a very close relationship, giving impression that Estill Bamberg depends on her mom for emotional support and perhaps daily structure.  Pt sought to reassure and comfort dtr, with soothing promises:   "It's going to be ok; we will get you ready for this," and, "She is very strong inside, she just doesn't know it yet."  Many losses and stressors in past year (pt's parents died seven weeks apart in July 27, 2013; pt's sister is in hospital with mini-strokes; nephew just had MVC this morning; and several others).  Pt also has a son in [place], with whom dtr Estill Bamberg does not have a close relationship; pt plans to involve their dad in supporting him, as their relationship is closer.  (Pt is no longer married to their father.)  Pt's coping tools include prayer, faith, and church.  Per pt, she is not close to her pastor; she verbalized that chaplain could help fill in that gap.  Provided pastoral presence and normalization of feelings, seeking both to honor their relationship and to make space for Estill Bamberg to emote and process her feelings without having pt's soothing overshadow dtr's pain and fear.  Brought them a prayer shawl and handmade plush heart as tangible signs of support and comfort.  Provided my card with encouragement to reach out any time.  Also introduced availability of counseling intern for individual/family support; Dr Marko Plume and I agreed that a more direct encouragement/referral may be helpful to them in a subsequent encounter.  They are receptive to Spiritual Care and plan to phone as desired; I will also be available for support on pt's f/u appointment date, 11/21.  Plan to send a handwritten notecard to each of them, as well  (separate cards to differentiate between them as individuals with differing needs).  Please also page as needs arise.  Thank you.  [place], [place], [facility] Pager [phone] Voicemail [phone]

## 2015-04-02 ENCOUNTER — Telehealth: Payer: Self-pay

## 2015-04-02 NOTE — Telephone Encounter (Signed)
Ms. Horney stated that the body rash noted at visit 03-31-15  has resolved with lotion application as discussed with Dr. Marko Plume at visit.

## 2015-04-04 ENCOUNTER — Telehealth: Payer: Self-pay | Admitting: Oncology

## 2015-04-04 ENCOUNTER — Telehealth: Payer: Self-pay

## 2015-04-04 DIAGNOSIS — C787 Secondary malignant neoplasm of liver and intrahepatic bile duct: Secondary | ICD-10-CM | POA: Insufficient documentation

## 2015-04-04 DIAGNOSIS — R634 Abnormal weight loss: Secondary | ICD-10-CM

## 2015-04-04 DIAGNOSIS — C569 Malignant neoplasm of unspecified ovary: Secondary | ICD-10-CM | POA: Insufficient documentation

## 2015-04-04 DIAGNOSIS — C7801 Secondary malignant neoplasm of right lung: Secondary | ICD-10-CM | POA: Insufficient documentation

## 2015-04-04 DIAGNOSIS — C7802 Secondary malignant neoplasm of left lung: Secondary | ICD-10-CM | POA: Insufficient documentation

## 2015-04-04 MED ORDER — MEGESTROL ACETATE 40 MG PO TABS: 40.0000 mg | ORAL_TABLET | Freq: Every day | ORAL | Status: AC

## 2015-04-04 NOTE — Telephone Encounter (Signed)
Spoke with patient re new date/time for 11/22 @ 1:45 am. Per triage nurse, Juliann Pulse appointments to be moved from 11/21 to 11/22.

## 2015-04-04 NOTE — Telephone Encounter (Signed)
-----   Message from Gordy Levan, MD sent at 04/04/2015  9:07 AM EST ----- Please send script for Megace 40 mg one daily  "hormone pill, for appetite" #30 1 RF to pharmacy  I am opening clinic on Tues 11-22. Please offer AM time spot x 45 min to patient that day - I know son is planning to come from [facility] for visit, so she may prefer to keep late afternoon on 11-21. Send POF to move visit + lab if she wants 11-22.  Please ask the [place] (I believe works out of rad onc? I am not sure who-) if they could meet with patient/ family either the day of my next visit or any time before that visit.  See my note from 11-7.  Daughter works night shift, so likely AM apts best, but you can ask patient what they prefer  thanks

## 2015-04-04 NOTE — Telephone Encounter (Signed)
S/w Clotilde Dieter and she directed me to Wadie Lessen for the palliative care liason. S/w Stanton Kidney and sent her an email to set up appt. on 11/21 in the AM. This is a time when her son and daughter may accompany her. Stanton Kidney only works at [employer] on Monday mornings.  Lauren will coordinate with Stanton Kidney so they do not duplicate their work.

## 2015-04-04 NOTE — Telephone Encounter (Signed)
Medical Oncology  MD spoke with patient by phone now to follow up. She tells me that she and daughter are "doing better", still have not reached decision on Hospice referral. She plans to meet with [facility] SW when she is back at office for my next visit on 04-14-15, son and daughter to accompany her then. I have again encouraged her to consider Hospice, which I believe would be very helpful for her care now and very beneficial especially to support daughter. I mentioned palliative care liason at [facility], and patient would like to meet either day of my visit or separately, as she feels this would help them make decision about Hospice care. Note daughter works third shift, goes to work at [employer]. She understands that I am glad to do FMLA for family if needed. Patient has MS Contin 30 mg and MS Contin 15 mg enough to get to my next visit, as the prescriptions were not done at same time.  We discussed trying Megace for appetite, that also a hormonal intervention. Patient appreciated call and knows to be in touch if concerns prior to next scheduled visit.   Godfrey Pick, MD

## 2015-04-04 NOTE — Progress Notes (Signed)
ADDENDUM to note 03-31-15  Also noted results of genetics testing found BRCA 2 abnormality. Patient and daughter particularly upset as they did not understand correspondence from her insurance and [employer]. I have reviewed the papers, which are specifically not a bill from her insurance, and which the [employer] letter addresses, letter from [employer] includes form that she needs to sign and fax back so that they can do appeal on her behalf. Copy made of this information for our records.  Godfrey Pick, MD

## 2015-04-04 NOTE — Telephone Encounter (Signed)
-----   Message from Gordy Levan, MD sent at 04/04/2015  9:07 AM EST ----- Please send script for Megace 40 mg one daily  "hormone pill, for appetite" #30 1 RF to pharmacy  I am opening clinic on Tues 11-22. Please offer AM time spot x 45 min to patient that day - I know son is planning to come from [facility] for visit, so she may prefer to keep late afternoon on 11-21. Send POF to move visit + lab if she wants 11-22.  Please ask the [place] (I believe works out of rad onc? I am not sure who-) if they could meet with patient/ family either the day of my next visit or any time before that visit.  See my note from 11-7.  Daughter works night shift, so likely AM apts best, but you can ask patient what they prefer  thanks

## 2015-04-04 NOTE — Telephone Encounter (Addendum)
S/w pt and she will change appt from 11/21 to 11/22 in AM. POF sent. Rx to pharmacy for megace done. S/w pt about [employer] having a form she needs to fill out and send back to them in case they need to appeal insurance. Pt will look for this form in the envelope. Inbasket sent to [employer] b/c pt also had an appt to talk with her on 11/22.

## 2015-04-04 NOTE — Telephone Encounter (Signed)
lvm for pt to call back, several messages from Dr Marko Plume. It is OK to transfer call to [place].

## 2015-04-08 NOTE — Telephone Encounter (Signed)
Spoke with Stanton Kidney as she did not receive E-mail with patient MRN and DOB.  Sent an  Higher education careers adviser to [place] with information.  She and Lauren and discussed case.

## 2015-04-10 ENCOUNTER — Telehealth: Payer: Self-pay | Admitting: *Deleted

## 2015-04-10 NOTE — Telephone Encounter (Signed)
Spoke with Ms. Kirsten Schroeder. She has plenty of the MS contin 15 mg tabs.  She will take three of these = 45 mg q 12 hrs and then when she comes to see Dr. Marko Plume 04-15-15 she will get new prescriptions. [name] agreeable to this plan.

## 2015-04-10 NOTE — Telephone Encounter (Signed)
Voicemail: "I need morphine 30 mg.  I have 15 mg but wasn't given enough supply of the 30 mg.  Can someone call me and tell me what I need to be taking for pain or what I need to do to get this refill.  My pharmacy said I have to call the doctor for this medicine."

## 2015-04-13 ENCOUNTER — Other Ambulatory Visit: Payer: Self-pay | Admitting: Oncology

## 2015-04-14 ENCOUNTER — Other Ambulatory Visit: Payer: BC Managed Care – PPO

## 2015-04-14 ENCOUNTER — Ambulatory Visit
Admission: RE | Admit: 2015-04-14 | Discharge: 2015-04-14 | Disposition: A | Discharge status: Home / Self Care | Payer: BC Managed Care – PPO | Source: Ambulatory Visit | Attending: Family Medicine | Admitting: Family Medicine

## 2015-04-14 ENCOUNTER — Telehealth: Payer: Self-pay | Admitting: Oncology

## 2015-04-14 ENCOUNTER — Ambulatory Visit: Payer: BC Managed Care – PPO | Admitting: Oncology

## 2015-04-14 DIAGNOSIS — Z515 Encounter for palliative care: Secondary | ICD-10-CM

## 2015-04-14 DIAGNOSIS — R530 Neoplastic (malignant) related fatigue: Secondary | ICD-10-CM

## 2015-04-14 DIAGNOSIS — C801 Malignant (primary) neoplasm, unspecified: Secondary | ICD-10-CM | POA: Diagnosis not present

## 2015-04-14 DIAGNOSIS — Z66 Do not resuscitate: Secondary | ICD-10-CM | POA: Diagnosis not present

## 2015-04-14 NOTE — Telephone Encounter (Signed)
s.w. pt and advised on NOV 22 lab/flush cx per pof pt ok and aware

## 2015-04-14 NOTE — Consult Note (Signed)
Patient [id number] D Deacon      DOB: Aug 30, 1955      [id number]     Consult Note from the Palliative Medicine Team at [place] Requested by: Dr Marko Plume     PCP: [name], DO Reason for Consultation: Goals of Care              Phone                                                                                                                                Number:[phone]  Assessment of patients Current state:  Metastatic ovarian cancer, most recent scan (03-25-15) shows extensive disease in liver and lungs.  Limited treatment options.    Consult is for introduction to the concept of Palliative Medicine, clarification of Advanced Directives,  holistic support and symptom management as indicated.   This NP Wadie Lessen reviewed medical records, received report from team,  and then meet with Kirsten Schroeder and her daughter Kirsten Schroeder in the OP cancer center setting.  Created space and opportunity for patient and her daughter to share thoughts, feelings and fear regarding terminal diagnosis,and the  emotional and physical struggles of living with this disease and facing one's  mortality                          A  discussion was had today regarding advanced directives.  Concepts specific to code status, artifical feeding and hydration, continued IV antibiotics and rehospitalization was had.  The difference between a aggressive medical intervention path  and a palliative comfort care path for this patient at this time was had.  Values and goals of care important to patient and family were attempted to be elicited.  MOST form completed Advanced Directives completed  Kirsten Schroeder is able to verbalize to her daughter the importance of quality of life.  She does not want any life prolonging interventions "when she gets to that point"; to [name] this when she loses her independence and is unable to care for herself. Kirsten Schroeder is struggling!  "I wish it was me who had the cancer", she speaks to minimal  support or friendships.  She does have a relationship with her brother and an uncle. We discussed grief and bereavement and resources.  Concept of Hospice and Palliative Care were discussed.  Patient speaks to her desire for a "second opinion", she plans to talk with Dr Marko Plume tomorrow.  We discussed the importance of using her time wisely   Natural trajectory and expectations at EOL were discussed.  Questions and concerns addressed.  Hard Choices booklet left for review. Family encouraged to call with questions or concerns.  PMT will continue to support holistically.    Goals of Care:  Code Status: DNR/DNI   Scope of Treatment:  At this time patient is open to all available and offered medical interventions to prolong quality life.  She is considering a second opinion for further cancer treatment options.  She wants to treat the treatable as far as infections, electrolyte imbalances and would consider artifical hydration.  She is realistic and understands her cancer is incurable but wants and hopes for continued quality life.    Symptom Management:   Fatigue: -Pace yourself -Plan your day -Include naps and breaks -get a little exercise -fuel the body -consider complementary therapies -deep breathing  -prayer/medication  Pain: Current medication regime is providing acceptable pain control   Psychosocial:  Emotional support offered to patient and daughter.  We discussed importance of support net especially for Kirsten Schroeder who is having a hard time facing her mother's mortality.  Both Kirsten Schroeder and Kirsten Schroeder spoke to an uncle who is supportive to [name] and a brother who "is there too". Introduced Teacher, adult education of a support group or individual one-on-one counseling.  Educated on hospice benefit and grief counseling support.   Patient Documents Completed or Given: Document Given Completed  Advanced Directives Pkt  x  MOST  x  DNR  x  Gone from My Sight    Hard Choices      Brief  HPI:  ONCOLOGIC HISTORY  Patient presented with RLE swelling negative for DVT July 2014, and right hydronephrosis. 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 She had 3 additional cycles, with progressive decrease in CA 125 from 5626 in 12-2012 to 596 by Jan 2015. CT 06-14-13 had ~ stable pelvic mass and adenopathy. Dr Josephina Shih recommended another 3 cycles chemotherapy, regimen changed to q 3 week taxol carboplatin on 06-27-13, which unfortunately caused worsening of peripheral neuropathy and severe taxol aches.Taxotere was substituted for taxol due to neuropathy in feet for cycles 8 and 9, completed 08-08-13. CA 125 improved to low of 494 in Feb 2015, then 732 just prior to cycle 9. CT AP 08-22-13 had minimal improvement, still not adequate to consider debulking surgery. She received doxil for 6 cycles, from 09-19-13 thru 02-06-14. CT AP 02-27-14 had some early increase in central pelvic mass, with treatment changed to gemzar beginning 03-29-14. CA 125 baseline for gemzar was 1932 on 03-27-14. Cycle 4 gemzar was given 05-22-14, with CA 125 2680. CDDP was added to gemzar beginning 06-05-14. She had cycle 5 CDDP gemzar on 09-04-14. Treatment changed to tamoxifen 09-30-14 due to poor tolerance of CDDP gemzar without clinical improvement. CT AP 12-13-14 compared with 06-2014 had some increase in the pelvic mass, but no new areas of involvement. Radiation treatment dates: 01/15/2015-02/19/2015 Site/dose: Pelvic mass, 45 gray in 25 fractions. CT 03-25-15 had new lung and liver metastases.  Completed palliative radiation to pelvis 02-19-15 which was helpful with symptoms, but unfortunately now with progression by CT 03-25-15 to lung and liver.  Pain is controlled with present medications (MSContin 45 mg q 12 hrs and prn MSIR). She is fatigued, but able  to do self care and some limited activities at home. Appetite is not very good tho she does eat and drink fluids, no vomiting and no significant nausea.   Continued slow physical and functional decline.  Patient and her family face anticipatory care needs and advanced directive decsions   ROS: fatigue, reports pain is currently controlled on current medications   PMH:  Past Medical History  Diagnosis Date  . Ulcerative colitis ([place])   . Hydronephrosis, right   . Ejection fraction <  50%     EF 56%  AND NORMAL WALL MOTION --  PER  MUGA  09-06-2013  . 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 Wears glasses   . Hydronephrosis, right   . Chemotherapy induced thrombocytopenia   . Peripheral neuropathy due to chemotherapy ([place])   . Hypertension     PT CURRENT NOT TAKING BP MEDICATION PER PCP SINCE BP IS LOW SECONDARY TO CHEMO  . Ovarian cancer ([place]) AUG 2014   SEROUS GYN CARCINOMA---  ONCOLOGIST--  DR Marko Plume    CURRENT CHEMOTHERAPY AND NEEDING MULTIPLE BLOOD TRANSFUSIONS  . Bone metastasis ([facility])     MILD ---  SECONDARY OVARIAN CANCER  . 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 Radiation 01/15/15-02/19/15    pelvic mass 45 gray     PSH: Past Surgical History  Procedure Laterality Date  . Cystoscopy w/ ureteral stent placement Right 01/12/2013    Procedure: CYSTOSCOPY WITH RETROGRADE PYELOGRAM/ RIGHT DOUBLE J STENT PLACEMENT;  Surgeon: Franchot Gallo, MD;  Location: [facility];  Service: Urology;  Laterality: Right;  . Cesarean section    . Cholecystectomy  1992  . Total colectomy w/ permanent ileostomy  1990  (2 PART SURGERY)    SEVERE UC  . Cystoscopy w/ ureteral stent placement Right 09/13/2013    Procedure: CYSTOSCOPY WITH STENT REPLACEMENT;  Surgeon: Franchot Gallo, MD;  Location: [facility];  Service: Urology;  Laterality: Right;  . Cystoscopy w/ ureteral stent placement Right 07/22/2014    Procedure: CYSTOSCOPY WITH STENT REPLACEMENT;  Surgeon: Jorja Loa, MD;  Location: [facility];  Service: Urology;  Laterality: Right;   I have reviewed the [place] and SH and  If appropriate update it with new information. Allergies  Allergen Reactions  . Penicillins Rash   Scheduled Meds: Continuous Infusions: PRN Meds:.    There were no vitals taken for this visit.     No intake or output data in the 24 hours ending 04/14/15 1459  Physical Exam:  General: Chronically ill appearing, NAD Neuro: weak, ambulates without device Skin: warm and dry   Labs: CBC    Component Value Date/Time   WBC 6.2 03/31/2015 1245   WBC 13.4* 01/17/2013 0430   RBC 3.87 03/31/2015 1245   RBC 3.43* 01/17/2013 0430   HGB 11.1* 03/31/2015 1245   HGB 11.4* 09/13/2013 1034   HCT 34.3* 03/31/2015 1245   HCT 30.5* 01/17/2013 0430   PLT 292 03/31/2015 1245   PLT 473* 01/17/2013 0430   MCV 88.7 03/31/2015 1245   MCV 88.9 01/17/2013 0430   MCH 28.6 03/31/2015 1245   MCH 29.2 01/17/2013 0430   MCHC 32.2 03/31/2015 1245   MCHC 32.8 01/17/2013 0430   RDW 16.3* 03/31/2015 1245   RDW 13.0 01/17/2013 0430   LYMPHSABS 0.4* 03/31/2015 1245   LYMPHSABS 1.1 01/10/2013 1135   MONOABS 0.5 03/31/2015 1245   MONOABS 1.6* 01/10/2013 1135   EOSABS 0.1 03/31/2015 1245   EOSABS 0.1 01/10/2013 1135   BASOSABS 0.0 03/31/2015 1245   BASOSABS 0.0 01/10/2013 1135    BMET    Component Value Date/Time   NA 135* 03/31/2015 1245   NA 135 01/17/2013 0430   K 3.8 03/31/2015 1245   K 3.6 01/17/2013 0430   CL 103 01/17/2013 0430   CO2 25 03/31/2015 1245   CO2 25 01/17/2013 0430   GLUCOSE 108 03/31/2015 1245   GLUCOSE 109* 01/17/2013 0430   BUN 8.3 03/31/2015 1245  BUN 3* 01/17/2013 0430   CREATININE 0.9 03/31/2015 1245   CREATININE 0.54 01/17/2013 0430   CREATININE 0.77 01/05/2013 1616   CALCIUM 8.9 03/31/2015 1245   CALCIUM 8.4 01/17/2013 0430   GFRNONAA >90 01/17/2013 0430   GFRAA >90 01/17/2013 0430    CMP     Component Value Date/Time    NA 135* 03/31/2015 1245   NA 135 01/17/2013 0430   K 3.8 03/31/2015 1245   K 3.6 01/17/2013 0430   CL 103 01/17/2013 0430   CO2 25 03/31/2015 1245   CO2 25 01/17/2013 0430   GLUCOSE 108 03/31/2015 1245   GLUCOSE 109* 01/17/2013 0430   BUN 8.3 03/31/2015 1245   BUN 3* 01/17/2013 0430   CREATININE 0.9 03/31/2015 1245   CREATININE 0.54 01/17/2013 0430   CREATININE 0.77 01/05/2013 1616   CALCIUM 8.9 03/31/2015 1245   CALCIUM 8.4 01/17/2013 0430   PROT 5.9* 03/31/2015 1245   PROT 7.8 01/10/2013 1135   ALBUMIN 2.8* 03/31/2015 1245   ALBUMIN 3.2* 01/10/2013 1135   AST 33 03/31/2015 1245   AST 18 01/10/2013 1135   ALT 13 03/31/2015 1245   ALT 13 01/10/2013 1135   ALKPHOS 87 03/31/2015 1245   ALKPHOS 66 01/10/2013 1135   BILITOT 0.39 03/31/2015 1245   BILITOT 0.9 01/10/2013 1135   GFRNONAA >90 01/17/2013 0430   GFRAA >90 01/17/2013 0430   ECOG PERFORMANCE STATUS* (Eastern Cooperative Oncology Group)  0 Fully active, able to continue with all pre-disease activities without restriction. Pt score  1 Restricted in physically strenuous activity but ambulatory and able to carry out work of a light or sedentary nature, e.g., light house work, office work.   2 Ambulatory and capable of all self-care but unable to carry out any work activities. Up and about more than 50% of waking hours.  2  3 Capable of only limited self-care. Confined to bed or chair more than 50% of waking hours.   4 Completely disabled. Cannot carry on any self-care. Totally confined to bed or chair.   5 Dead.    As published in Am. J. Clin. Oncol.: Eustace Pen, M.M., Colon Flattery., [place], D.C., [place], Sharen Hint., Drexel Iha, P.P.: Toxicity And Response Criteria Of [facility]. [place] [id number], 1982.  The ECOG Performance Status is in the public domain therefore available for public use. To duplicate the scale, please cite the reference above and credit the [facility], Tyler Pita M.D., Group Chair    Time In Time Out Total Time Spent with Patient Total Overall Time  1400 1515 75 min 75 min    Greater than 50%  of this time was spent counseling and coordinating care related to the above assessment and plan.   Wadie Lessen NP  Palliative Medicine Team Team Phone # [phone] Pager [phone]

## 2015-04-15 ENCOUNTER — Telehealth: Payer: Self-pay | Admitting: Oncology

## 2015-04-15 ENCOUNTER — Encounter: Payer: Self-pay | Admitting: Oncology

## 2015-04-15 ENCOUNTER — Ambulatory Visit (HOSPITAL_BASED_OUTPATIENT_CLINIC_OR_DEPARTMENT_OTHER): Payer: BC Managed Care – PPO | Admitting: Oncology

## 2015-04-15 ENCOUNTER — Other Ambulatory Visit: Payer: BC Managed Care – PPO

## 2015-04-15 VITALS — BP 129/80 | HR 97 | Temp 98.5°F | Resp 18 | Ht 62.0 in | Wt 118.8 lb

## 2015-04-15 DIAGNOSIS — C7800 Secondary malignant neoplasm of unspecified lung: Secondary | ICD-10-CM | POA: Diagnosis not present

## 2015-04-15 DIAGNOSIS — C7801 Secondary malignant neoplasm of right lung: Secondary | ICD-10-CM

## 2015-04-15 DIAGNOSIS — I890 Lymphedema, not elsewhere classified: Secondary | ICD-10-CM

## 2015-04-15 DIAGNOSIS — C772 Secondary and unspecified malignant neoplasm of intra-abdominal lymph nodes: Secondary | ICD-10-CM

## 2015-04-15 DIAGNOSIS — C801 Malignant (primary) neoplasm, unspecified: Secondary | ICD-10-CM

## 2015-04-15 DIAGNOSIS — C569 Malignant neoplasm of unspecified ovary: Secondary | ICD-10-CM

## 2015-04-15 DIAGNOSIS — R634 Abnormal weight loss: Secondary | ICD-10-CM

## 2015-04-15 DIAGNOSIS — D649 Anemia, unspecified: Secondary | ICD-10-CM

## 2015-04-15 DIAGNOSIS — Z1509 Genetic susceptibility to other malignant neoplasm: Secondary | ICD-10-CM

## 2015-04-15 DIAGNOSIS — R300 Dysuria: Secondary | ICD-10-CM

## 2015-04-15 DIAGNOSIS — G620 Drug-induced polyneuropathy: Secondary | ICD-10-CM

## 2015-04-15 DIAGNOSIS — C7802 Secondary malignant neoplasm of left lung: Secondary | ICD-10-CM

## 2015-04-15 DIAGNOSIS — Z1501 Genetic susceptibility to malignant neoplasm of breast: Secondary | ICD-10-CM

## 2015-04-15 DIAGNOSIS — C787 Secondary malignant neoplasm of liver and intrahepatic bile duct: Secondary | ICD-10-CM

## 2015-04-15 MED ORDER — MORPHINE SULFATE ER 15 MG PO TBCR: 15.0000 mg | EXTENDED_RELEASE_TABLET | Freq: Two times a day (BID) | ORAL | Status: DC

## 2015-04-15 MED ORDER — ONDANSETRON HCL 8 MG PO TABS: 8.0000 mg | ORAL_TABLET | Freq: Two times a day (BID) | ORAL | Status: DC | PRN

## 2015-04-15 MED ORDER — MORPHINE SULFATE ER 30 MG PO TBCR: 30.0000 mg | EXTENDED_RELEASE_TABLET | Freq: Two times a day (BID) | ORAL | Status: DC

## 2015-04-15 NOTE — Progress Notes (Signed)
OFFICE PROGRESS NOTE   April 15, 2015   Physicians:ClarkePearson, Quillian Quince; Evalee Mutton, Marlene Bast, Floy Sabina, Annie Main.; Gery Pray.   INTERVAL HISTORY:   Patient is seen, together with son, daughter, sister and brother, for further discussion of progressive serous gyn carcinoma, now involving lungs and liver by CT 03-25-15. Fortunately she has had symptomatic benefit from radiation to pelvis, which completed in late Sept.   Patient met with Palliative Care liason and [facility] social worker on 04-14-15, with HCPOA completed  (daughter Estill Bamberg, with son Corene Cornea as backup). She also completed Medical Orders for Scope of Treatment, which she requests as limited, not to include intubation, mechanical ventilation or ICU. Copies of this information to be scanned into this EMR.   Patient has had improved appetite with Megace and energy also better since she was here 3 weeks ago. She continues MS Contin 45 mg every 12 hours with prn MSIR. Bowels are moving via colostomy. She denies increased SOB, any cough or chest pain. She has not had recent overt hematuria, stent in on right, and no other bleeding. Swelling RLE stable. Dry skin LE much better with lotion. Remainder of 10 point Review of Systems unchanged/ negative.    PAC in, flushed 03-31-15 Right JJ stent Genetics testing.sent 02-19-15 and revealed a mutation in the BRCA2 gene called [id number]. (BreastOvarian panel by [facility]) CA 125 at diagnosis 5626  Flu vaccine 03-03-15     ONCOLOGIC HISTORY Patient presented with RLE swelling negative for DVT July 2014, and right hydronephrosis. Biopsy of left inguinal node ([id number]) found carcinoma consistent with serous gyn primary.Neoadjuvant dose dense taxol carboplatin began 01-31-13, with improvement in pelvic mass and adenopathy by scans after 3 cycles.. She had 3 additional cycles, with progressive decrease in CA 125 from 5626 in 12-2012 to 596 by Jan 2015. CT 06-14-13 had ~  stable pelvic mass and adenopathy. Dr Josephina Shih recommended another 3 cycles chemotherapy, regimen changed to q 3 week taxol carboplatin on 06-27-13, which unfortunately caused worsening of peripheral neuropathy and severe taxol aches.Taxotere was substituted for taxol due to neuropathy in feet for cycles 8 and 9, completed 08-08-13. CA 125 improved to low of 494 in Feb 2015, then 732 just prior to cycle 9. CT AP 08-22-13 had minimal improvement, still not adequate to consider debulking surgery. She received doxil for 6 cycles, from 09-19-13 thru 02-06-14. CT AP 02-27-14 had some early increase in central pelvic mass, with treatment changed to gemzar beginning 03-29-14. CA 125 baseline for gemzar was 1932 on 03-27-14. Cycle 4 gemzar was given 05-22-14, with CA 125 2680. CDDP was added to gemzar beginning 06-05-14. She had cycle 5 CDDP gemzar on 09-04-14. Treatment changed to tamoxifen 09-30-14 due to poor tolerance of CDDP gemzar without clinical improvement. CT AP 12-13-14 compared with 06-2014 had some increase in the pelvic mass, but no new areas of involvement. Radiation treatment dates: 01/15/2015-02/19/2015 Site/dose: Pelvic mass, 45 gray in 25 fractions. CT 03-25-15 had new lung and liver metastases.   Objective:  Vital signs in last 24 hours:  BP 129/80 mmHg  Pulse 97  Temp(Src) 98.5 F (36.9 C) (Oral)  Resp 18  Ht 5' 2"  (1.575 m)  Wt 118 lb 12.8 oz (53.887 kg)  BMI 21.72 kg/m2  SpO2 100% Weight is up 3 lbs from 03-31-15 Alert, oriented and appropriate. Ambulatory without assistance. Respirations not labored RA. RLE swelling 1-2+. Patient declined further exam with family present.  Lab Results:  Results for orders placed or performed in visit  on 03/31/15  CBC with Differential  Result Value Ref Range   WBC 6.2 3.9 - 10.3 10e3/uL   NEUT# 5.1 1.5 - 6.5 10e3/uL   HGB 11.1 (L) 11.6 - 15.9 g/dL   HCT 34.3 (L) 34.8 - 46.6 %   Platelets 292 145 - 400 10e3/uL   MCV 88.7 79.5 - 101.0 fL   MCH 28.6  25.1 - 34.0 pg   MCHC 32.2 31.5 - 36.0 g/dL   RBC 3.87 3.70 - 5.45 10e6/uL   RDW 16.3 (H) 11.2 - 14.5 %   lymph# 0.4 (L) 0.9 - 3.3 10e3/uL   MONO# 0.5 0.1 - 0.9 10e3/uL   Eosinophils Absolute 0.1 0.0 - 0.5 10e3/uL   Basophils Absolute 0.0 0.0 - 0.1 10e3/uL   NEUT% 82.7 (H) 38.4 - 76.8 %   LYMPH% 6.5 (L) 14.0 - 49.7 %   MONO% 8.7 0.0 - 14.0 %   EOS% 1.8 0.0 - 7.0 %   BASO% 0.3 0.0 - 2.0 %  Comprehensive metabolic panel (Cmet) - [facility]  Result Value Ref Range   Sodium 135 (L) 136 - 145 mEq/L   Potassium 3.8 3.5 - 5.1 mEq/L   Chloride 102 98 - 109 mEq/L   CO2 25 22 - 29 mEq/L   Glucose 108 70 - 140 mg/dl   BUN 8.3 7.0 - 26.0 mg/dL   Creatinine 0.9 0.6 - 1.1 mg/dL   Total Bilirubin 0.39 0.20 - 1.20 mg/dL   Alkaline Phosphatase 87 40 - 150 U/L   AST 33 5 - 34 U/L   ALT 13 0 - 55 U/L   Total Protein 5.9 (L) 6.4 - 8.3 g/dL   Albumin 2.8 (L) 3.5 - 5.0 g/dL   Calcium 8.9 8.4 - 10.4 mg/dL   Anion Gap 8 3 - 11 mEq/L   EGFR 68 (L) >90 ml/min/1.73 m2    CA 125 on 03-03-15 8511, this having been 3202 on 11-18-14  Studies/Results:  No results found.  Medications: I have reviewed the patient's current medications. Continue MS Contin at 45 mg bid and prn MSIR  DISCUSSION: patient and family are encouraged that she is doing some better now out a little further from completion of radiation. I have reviewed progression of metastatic disease with family present. She is not in agreement with Hospice yet, tho I have mentioned that Hospice support can be significant for family members also.  Patient and family request second opinion. We have discussed referral to tertiary care center and I have also mentioned that Dr Josephina Shih, who is already very familiar with her case, would be an excellent resource. She is in agreement with consultation with him, which we have been able to set up in [facility] for 05-02-15. Daughter asks about PARP inhibitor, which would be an option as she has been found to  have BRCA 2 abnormality. I will ask [place] oral chemotherapy pharmacist to help look into insurance coverage for olaparib and to discuss with patient and daughter. They understand that there are possible significant side effects with this treatment as well as with any aggressive treatment.   Patient is also in agreement with a return visit to me, which will be scheduled for ~ mid Dec. She knows that she can call prior if needed.   We did not discuss family BRCA testing at this visit.  Assessment/Plan: 1. 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 She / and especially family prefer to continue some treatment in preference to strict comfort care. She has recently been found BRCA 2 +, will look into olaparib. She would like to see Dr Josephina Shih again for any other recommendations.  2.Right hydronephrosis with JJ stent: symptoms better with myrbetrig and the MS Contin. Dr Diona Fanti following.  3.history of ulcerative colitis post proctocolectomy with ileostomy remotely 4.previous UTIs related to stent, no symptoms presently 5.Marland KitchenPAC in, flush with my visit in Dec and every 6-8 weeks when not otherwise used. 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 Improved off of chemo. Iron low normal on 09-04-14, continue oral iron as tolerated 9. Progressive weight loss: despite ongoing efforts, related to metastatic cancer. 10. Advance Directives being addressed. She would not want to be on life support in irreversible situation.  11. Lymphedema RLE also some better likely from the radiation. 12. Flu vaccine 03-03-15 13.Advance directives in place: daughter and son HCPOA, limited interventions including hospitalization but not including intubation, mechanical ventilation, intensive  care unit.  I have given them opportunity to ask questions. Patient and family are in agreement with recommendations and plans as above. Communication with [place] oral chemo pharmacist. Cc Drs Josephina Shih, Dahlstedt, [place].   Time spent 25 min, essentially 100% counseling and coordination of care.    Kedar Sedano P, MD   04/15/2015, 7:30 PM

## 2015-04-15 NOTE — Telephone Encounter (Signed)
Appointments made and avs printed for patient °

## 2015-04-16 ENCOUNTER — Encounter: Payer: Self-pay | Admitting: *Deleted

## 2015-04-16 NOTE — Progress Notes (Signed)
[place] Social Work  Clinical Social Work was referred by medical oncologist to review and complete healthcare advance directives and discuss disability options.  Clinical Education officer, museum met with patient, daughter Estill Bamberg, and Suzan Nailer palliative care NP in [place] office.  The patient designated Estill Bamberg as their primary healthcare agent and son Corene Cornea as their secondary agent.  Patient also completed healthcare living will and MOST form with Wadie Lessen, NP.  CSW was present while patient/family discussed goals of care with NP.   CSW also discussed applying for social security disability.  CSW made referral to [employer] disability assistance to help patient with application process.  Clinical Social Worker notarized documents and made copies for patient/family. Clinical Social Worker will send documents to medical records to be scanned into patient's chart. Clinical Social Worker encouraged patient/family to contact with any additional questions or concerns.  Polo Riley, MSW, [place] Worker [facility] [phone]

## 2015-04-22 ENCOUNTER — Telehealth: Payer: Self-pay

## 2015-04-22 ENCOUNTER — Encounter: Payer: Self-pay | Admitting: Pharmacist

## 2015-04-22 NOTE — Progress Notes (Signed)
Oral Chemotherapy Pharmacist Encounter   I spoke with patient for overview of new oral chemotherapy medication: Olaparib. Pt is doing well. The prescriptions have been sent to the [facility] for benefit analysis and approval today.   Counseled patient on administration, dosing, side effects, safe handling, and monitoring. Side effects include but not limited to: fatigue, nasuea, diarrhea, peripheral edema, low blood counts, headache, and lung infection (rare).  Kirsten Schroeder voiced understanding and appreciation. She is seeing Dr. Fermin Schwab next week for second opinion but is ready for [place] to begin insurance process on olaparib.   All questions answered.  Will follow up with patient regarding insurance and pharmacy.  Thank you,  Montel Clock, PharmD, [facility]

## 2015-04-23 ENCOUNTER — Other Ambulatory Visit: Payer: Self-pay | Admitting: *Deleted

## 2015-04-23 ENCOUNTER — Telehealth: Payer: Self-pay | Admitting: *Deleted

## 2015-04-23 DIAGNOSIS — C772 Secondary and unspecified malignant neoplasm of intra-abdominal lymph nodes: Secondary | ICD-10-CM

## 2015-04-23 DIAGNOSIS — C569 Malignant neoplasm of unspecified ovary: Secondary | ICD-10-CM

## 2015-04-23 DIAGNOSIS — Z1501 Genetic susceptibility to malignant neoplasm of breast: Secondary | ICD-10-CM

## 2015-04-23 DIAGNOSIS — Z1509 Genetic susceptibility to other malignant neoplasm: Secondary | ICD-10-CM

## 2015-04-23 MED ORDER — ONDANSETRON HCL 8 MG PO TABS: 8.0000 mg | ORAL_TABLET | Freq: Two times a day (BID) | ORAL | Status: AC | PRN

## 2015-04-23 MED ORDER — OLAPARIB 50 MG PO CAPS: 400.0000 mg | ORAL_CAPSULE | Freq: Two times a day (BID) | ORAL | Status: AC

## 2015-04-23 NOTE — Telephone Encounter (Signed)
Received fax from [facility] with confirmation of zofran quantity #30 authorization. Send to HIM to be scanned into patient's chart.

## 2015-04-23 NOTE — Telephone Encounter (Signed)
Received fax from patient's pharmacy that her insurance would only cover #20 Zofran tablets (prescription sent for #30). Called and completed prior authorization. Authorization received for 03/24/15-04/22/16 and the Case ID#: [id number]. Called and left VM at patient's pharmacy with this information and notified patient as well. New prescription for zofran 8mg  tablets #30 sent to patient's pharmacy.

## 2015-04-28 DIAGNOSIS — Z515 Encounter for palliative care: Secondary | ICD-10-CM | POA: Insufficient documentation

## 2015-04-28 DIAGNOSIS — R530 Neoplastic (malignant) related fatigue: Secondary | ICD-10-CM | POA: Insufficient documentation

## 2015-04-28 DIAGNOSIS — C799 Secondary malignant neoplasm of unspecified site: Secondary | ICD-10-CM | POA: Insufficient documentation

## 2015-04-28 DIAGNOSIS — Z66 Do not resuscitate: Secondary | ICD-10-CM | POA: Insufficient documentation

## 2015-05-01 ENCOUNTER — Telehealth: Payer: Self-pay

## 2015-05-01 DIAGNOSIS — C772 Secondary and unspecified malignant neoplasm of intra-abdominal lymph nodes: Secondary | ICD-10-CM

## 2015-05-01 DIAGNOSIS — C569 Malignant neoplasm of unspecified ovary: Secondary | ICD-10-CM

## 2015-05-01 DIAGNOSIS — R300 Dysuria: Secondary | ICD-10-CM

## 2015-05-01 MED ORDER — MORPHINE SULFATE 15 MG PO TABS: 15.0000 mg | ORAL_TABLET | ORAL | Status: AC | PRN

## 2015-05-01 NOTE — Telephone Encounter (Addendum)
Kirsten Schroeder states that  Her urine is cloudy and occasionally burns.  She wants to get a U/A/C&S tomorrow when she comes in to see Dr. Fermin Schwab.  Lab appointment made for 0930. On 05-02-15. Pt needs a refill on her MSIR 15 mg tabs.  She is using them more frequently the last few days as she cannot get her MS Contin 15 mg tabs filled until 05-03-15 per her insurance.  She is fine and comfortable using the MSIR. Prescription given to Kirsten Rud, RN to give to Kirsten Schroeder at appointment tomorrow.  Kirsten Schroeder wil also follow up with U/A C&S tomorrow as well.

## 2015-05-02 ENCOUNTER — Ambulatory Visit: Payer: BC Managed Care – PPO | Attending: Gynecology | Admitting: Gynecology

## 2015-05-02 ENCOUNTER — Other Ambulatory Visit (HOSPITAL_BASED_OUTPATIENT_CLINIC_OR_DEPARTMENT_OTHER): Payer: BC Managed Care – PPO

## 2015-05-02 ENCOUNTER — Encounter: Payer: Self-pay | Admitting: Gynecology

## 2015-05-02 VITALS — BP 127/68 | HR 114 | Temp 98.2°F | Resp 18 | Ht 62.0 in | Wt 119.3 lb

## 2015-05-02 DIAGNOSIS — R300 Dysuria: Secondary | ICD-10-CM

## 2015-05-02 DIAGNOSIS — C569 Malignant neoplasm of unspecified ovary: Secondary | ICD-10-CM | POA: Diagnosis present

## 2015-05-02 DIAGNOSIS — N390 Urinary tract infection, site not specified: Secondary | ICD-10-CM

## 2015-05-02 LAB — URINALYSIS, MICROSCOPIC - CHCC
Bilirubin (Urine): NEGATIVE
Glucose: NEGATIVE mg/dL
KETONES: NEGATIVE mg/dL
Nitrite: NEGATIVE
PH: 6.5 (ref 4.6–8.0)
Protein: 300 mg/dL
SPECIFIC GRAVITY, URINE: 1.015 (ref 1.003–1.035)
Urobilinogen, UR: 0.2 mg/dL (ref 0.2–1)

## 2015-05-02 MED ORDER — SULFAMETHOXAZOLE-TRIMETHOPRIM 800-160 MG PO TABS: 1.0000 | ORAL_TABLET | Freq: Two times a day (BID) | ORAL | Status: DC

## 2015-05-02 NOTE — Progress Notes (Signed)
Consult Note: Gyn-Onc   Kirsten Schroeder 59 y.o. female  Chief Complaint  Patient presents with  . 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   Plan:  The  patient will be starting a PARP  inhibitor today. Side effects were discussed between the patient and the patient navigator.  Given her urinalysis suggesting a urinary tract infection she is started on Septra DS for 5 days. Urine culture will be sent.  Discussion the patient and her daughter other alternative treatments including:  Oral etoposide  Oral Cytoxan combined with IV a Avastin  Weekly Taxol or Taxotere.  For now we will see how the PARP inhibitor goes. Interval History: The patient returns today seeking further input as to management options. Her prior treatment course was reviewed. She is most recently finished pelvis radiation therapy and has had significant improvement in her pelvic pressure symptoms and pressure on the bladder. She is having some bladder symptoms and a urinalysis today suggests urinary tract infection. (Culture will be sent)  The patient is got insurance approval to begin a PARP inhibitor which she intends to begin today. Overall her functional status remains reasonably good. Sheets 5 small meals a day. 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   History of present illness:.The patient was initially seen in consultation at the request of Dr Julieanne Manson regarding a newly diagnosed pelvic mass. The patient was initially admitted for evaluation of hydronephrosis and a UTI. Further evaluation revealed a pelvic mass, and retroperitoneal adenopathy. From a gynecologic point of view the patient denies any past gynecologic history. She said she had a pelvic examination in June which is relatively normal. She indicates that her gynecologist had been concern that because of her total colectomy the uterus is tilted  posteriorly. Patient denies any pelvic pain. She went through menopause at about age 14 and has not had any bleeding.   Given the extent of disease we initiated dose dense carboplatin and Taxol in a neoadjuvant fashion. Overall she received 9 cycles of carboplatin and Taxol. And initiation of therapy her CA 125 was 5626 and fell to a low value of 494 units per mL on July 09 2013. However, on 08/06/2013 CA 125 rose to 732 units per mL. CT scan at that time showed stable disease over the prior 3 months.  In April 2015 the patient's regimen was changed to Doxil. Subsequent [facility] [place] she was treated with single agent gemcitabine and in the fall of 2016 was treated with whole pelvis radiation therapy because of symptoms associated with the bulk tumor in the pelvis.   Review of Systems:10 point review of systems is negative except as noted in interval history.   Vitals: Blood pressure 127/68, pulse 114, temperature 98.2 F (36.8 C), temperature source Oral, resp. rate 18, height 5\' 2"  (1.575 m), weight 119 lb 4.8 oz (54.114 kg), SpO2 98 %.  Physical Exam: General : The patient is a healthy woman in no acute distress.  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 This is easily reducible. The suprapubic mass seems to have resolved. There is no evidence of ascites.  Pelvic:   Deferred today. At the patient's request.   Allergies  Allergen Reactions  . Penicillins Rash  Past Medical History  Diagnosis Date  . Ulcerative colitis ([place])   . Hydronephrosis, right   . Ejection fraction < 50%     EF 56%  AND NORMAL WALL MOTION --  PER  MUGA  09-06-2013  . 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 Wears glasses   . Hydronephrosis, right   . Chemotherapy induced thrombocytopenia   . Peripheral neuropathy due to  chemotherapy ([place])   . Hypertension     PT CURRENT NOT TAKING BP MEDICATION PER PCP SINCE BP IS LOW SECONDARY TO CHEMO  . Ovarian cancer ([place]) AUG 2014   SEROUS GYN CARCINOMA---  ONCOLOGIST--  DR Marko Plume    CURRENT CHEMOTHERAPY AND NEEDING MULTIPLE BLOOD TRANSFUSIONS  . Bone metastasis ([facility])     MILD ---  SECONDARY OVARIAN CANCER  . 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 Radiation 01/15/15-02/19/15    pelvic mass 45 gray    Past Surgical History  Procedure Laterality Date  . Cystoscopy w/ ureteral stent placement Right 01/12/2013    Procedure: CYSTOSCOPY WITH RETROGRADE PYELOGRAM/ RIGHT DOUBLE J STENT PLACEMENT;  Surgeon: Franchot Gallo, MD;  Location: [facility];  Service: Urology;  Laterality: Right;  . Cesarean section    . Cholecystectomy  1992  . Total colectomy w/ permanent ileostomy  1990  (2 PART SURGERY)    SEVERE UC  . Cystoscopy w/ ureteral stent placement Right 09/13/2013    Procedure: CYSTOSCOPY WITH STENT REPLACEMENT;  Surgeon: Franchot Gallo, MD;  Location: [facility];  Service: Urology;  Laterality: Right;  . Cystoscopy w/ ureteral stent placement Right 07/22/2014    Procedure: CYSTOSCOPY WITH STENT REPLACEMENT;  Surgeon: Jorja Loa, MD;  Location: [facility];  Service: Urology;  Laterality: Right;    Current Outpatient Prescriptions  Medication Sig Dispense Refill  . Calcium Carbonate-Vitamin D (CALCIUM + D PO) Take 1 tablet by mouth daily.     . Fe Fum-FA-B Cmp-C-Zn-Mg-Mn-Cu (FERROCITE PLUS) 106-1 MG TABS Take 1 tablet by mouth daily. 30 each 5  . lidocaine (XYLOCAINE) 2 % solution as needed. Mix lidocaine 1:1 with Desitin and apply to perineum as directed.    . lidocaine-prilocaine (EMLA) cream Apply 1 application topically as needed.    . megestrol (MEGACE) 40 MG tablet Take 1 tablet (40 mg total) by mouth daily. Hormone pill for appetite 30 tablet 1  . mirabegron ER (MYRBETRIQ) 50 MG TB24 tablet Take 50 mg  by mouth daily.    Marland Kitchen morphine (MS CONTIN) 15 MG 12 hr tablet Take 1 tablet (15 mg total) by mouth every 12 (twelve) hours. To equal total 45 mg dose of MS Contin 60 tablet 0  . morphine (MS CONTIN) 30 MG 12 hr tablet Take 1 tablet (30 mg total) by mouth every 12 (twelve) hours. 60 tablet 0  . morphine (MSIR) 15 MG tablet Take 1-2 tablets (15-30 mg total) by mouth every 4 (four) hours as needed for severe pain. 60 tablet 0  . Multiple Vitamin (MULTIVITAMIN WITH MINERALS) TABS tablet Take 1 tablet by mouth daily.    . ondansetron (ZOFRAN) 8 MG tablet Take 1-2 tablets (8-16 mg total) by mouth every 12 (twelve) hours as needed for nausea. 30 tablet 2  . oxybutynin (DITROPAN) 5 MG tablet Take 1 tablet (5 mg total) by mouth 3 (three) times daily. 90 tablet 3  . sodium chloride (OCEAN) 0.65 % nasal spray Place 1 spray into the nose as needed. Use every 1-3 hours while  awake to keep nose moist, which helps to prevent nosebleeds    . acyclovir (ZOVIRAX) 200 MG capsule Take 1 capsule (200 mg total) by mouth 3 (three) times daily as needed. For fever blister (Patient not taking: Reported on 03/03/2015) 30 capsule 1  . diazepam (VALIUM) 2 MG tablet Take 1 mg by mouth daily as needed for anxiety.     Marland Kitchen LORazepam (ATIVAN) 0.5 MG tablet Take 1 tablet (0.5 mg total) by mouth every 6 (six) hours as needed (for nausea). (Patient not taking: Reported on 02/03/2015) 30 tablet 0  . olaparib (LYNPARZA) 50 MG capsule Take 8 capsules (400 mg total) by mouth 2 (two) times daily. Swallow whole. (Patient not taking: Reported on 05/02/2015) 480 capsule 0   No current facility-administered medications for this visit.   Facility-Administered Medications Ordered in Other Visits  Medication Dose Route Frequency Provider Last Rate Last Dose  . sodium chloride 0.9 % injection 10 mL  10 mL Intravenous PRN Gordy Levan, MD   10 mL at 02/19/13 [id number]    Social History   Social History  . Marital Status: Divorced    Spouse Name:  N/A  . Number of Children: 2  . Years of Education: N/A   Occupational History  . 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 Smoking status: Never Smoker   . Smokeless tobacco: Never Used  . Alcohol Use: No  . Drug Use: No  . Sexual Activity: Not on file   Other Topics Concern  . Not on file   Social History Narrative   Gets reg exercise    Family History  Problem Relation Age of Onset  . Hypertension Mother   . Arthritis Mother   . Stroke Mother   . Hypertension Father   . Diabetes Father   . Lung cancer Father   . Hypertension Brother   . Diabetes Brother   . Skin cancer Sister   . Prostate cancer Paternal Grandfather   . Leukemia Maternal Aunt   . Stomach cancer Maternal Grandfather       Alvino Chapel, MD 05/02/2015, 10:20 AM       Consult Note: Gyn-Onc   Kirsten Schroeder 59 y.o. female  Chief Complaint  Patient presents with  . 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 The patient is currently receiving Doxil. Clinically she is doing very well.. Plan:  The current situation was reviewed with the patient and her son. She will continue receiving Doxil. The next dose is scheduled for 11/14/2013. Precautions regarding preventing PPE were reviewed. The patient will return to see me at the discretion of Dr. Marko Plume Interval History: The patient returns for followup . After our last visit, the patient was started on Doxil received her first dose on 09/19/2013. At that time the CA 125 value was 573 units per mL. Patient received her second cycle on May 27. At that time CA 125 has risen to 925 units per mL. However on June 10 a repeat CA 125 is relatively stable (945 units per mL).  Overall the patient feels very well. She denies any abdominal pain or pressure. She did have ureteral stent replaced recently. She is tolerating the Doxil well and has had no  difficulty with PPE. She has no GI or GU symptoms.  History of present illness:.The patient was initially seen in consultation at the request of Dr Julieanne Manson  regarding a newly diagnosed pelvic mass. The patient was initially admitted for evaluation of hydronephrosis and a UTI. Further evaluation revealed a pelvic mass, and retroperitoneal adenopathy. From a gynecologic point of view the patient denies any past gynecologic history. She said she had a pelvic examination in June which is relatively normal. She indicates that her gynecologist had been concern that because of her total colectomy the uterus is tilted posteriorly. Patient denies any pelvic pain. She went through menopause at about age 62 and has not had any bleeding.   Given the extent of disease we initiated dose dense carboplatin and Taxol in a neoadjuvant fashion. Overall she received 9 cycles of carboplatin and Taxol. And initiation of therapy her CA 125 was 5626 and fell to a low value of 494 units per mL on July 09 2013. However, on 08/06/2013 CA 125 rose to 732 units per mL. CT scan at that time showed stable disease over the prior 3 months.  In April 2015 the patient's regimen was changed to Doxil.   Review of Systems:10 point review of systems is negative except as noted in interval history.   Vitals: Blood pressure 127/68, pulse 114, temperature 98.2 F (36.8 C), temperature source Oral, resp. rate 18, height 5\' 2"  (1.575 m), weight 119 lb 4.8 oz (54.114 kg), SpO2 98 %.  Physical Exam: General : The patient is a healthy woman in no acute distress.  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 This is easily reducible. The suprapubic mass seems to have resolved. There is no evidence of ascites.  Pelvic:   Deferred  today. At the patient's request.   Allergies  Allergen Reactions  . Penicillins Rash    Past Medical History  Diagnosis Date  . Ulcerative colitis ([place])   . Hydronephrosis, right   . Ejection fraction < 50%     EF 56%  AND NORMAL WALL MOTION --  PER  MUGA  09-06-2013  . 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 Wears glasses   . Hydronephrosis, right   . Chemotherapy induced thrombocytopenia   . Peripheral neuropathy due to chemotherapy ([place])   . Hypertension     PT CURRENT NOT TAKING BP MEDICATION PER PCP SINCE BP IS LOW SECONDARY TO CHEMO  . Ovarian cancer ([place]) AUG 2014   SEROUS GYN CARCINOMA---  ONCOLOGIST--  DR Marko Plume    CURRENT CHEMOTHERAPY AND NEEDING MULTIPLE BLOOD TRANSFUSIONS  . Bone metastasis ([facility])     MILD ---  SECONDARY OVARIAN CANCER  . 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 Radiation 01/15/15-02/19/15    pelvic mass 45 gray    Past Surgical History  Procedure Laterality Date  . Cystoscopy w/ ureteral stent placement Right 01/12/2013    Procedure: CYSTOSCOPY WITH RETROGRADE PYELOGRAM/ RIGHT DOUBLE J STENT PLACEMENT;  Surgeon: Franchot Gallo, MD;  Location: [facility];  Service: Urology;  Laterality: Right;  . Cesarean section    . Cholecystectomy  1992  . Total colectomy w/ permanent ileostomy  1990  (2 PART SURGERY)    SEVERE UC  . Cystoscopy w/ ureteral stent placement Right 09/13/2013    Procedure: CYSTOSCOPY WITH STENT REPLACEMENT;  Surgeon: Franchot Gallo, MD;  Location: [facility];  Service: Urology;  Laterality: Right;  . Cystoscopy w/ ureteral stent placement Right 07/22/2014    Procedure:  CYSTOSCOPY WITH STENT REPLACEMENT;  Surgeon: Jorja Loa, MD;  Location: [facility];  Service: Urology;  Laterality: Right;    Current Outpatient Prescriptions  Medication Sig Dispense Refill  . Calcium Carbonate-Vitamin D (CALCIUM + D PO) Take 1 tablet by mouth daily.     . Fe Fum-FA-B Cmp-C-Zn-Mg-Mn-Cu (FERROCITE PLUS)  106-1 MG TABS Take 1 tablet by mouth daily. 30 each 5  . lidocaine (XYLOCAINE) 2 % solution as needed. Mix lidocaine 1:1 with Desitin and apply to perineum as directed.    . lidocaine-prilocaine (EMLA) cream Apply 1 application topically as needed.    . megestrol (MEGACE) 40 MG tablet Take 1 tablet (40 mg total) by mouth daily. Hormone pill for appetite 30 tablet 1  . mirabegron ER (MYRBETRIQ) 50 MG TB24 tablet Take 50 mg by mouth daily.    Marland Kitchen morphine (MS CONTIN) 15 MG 12 hr tablet Take 1 tablet (15 mg total) by mouth every 12 (twelve) hours. To equal total 45 mg dose of MS Contin 60 tablet 0  . morphine (MS CONTIN) 30 MG 12 hr tablet Take 1 tablet (30 mg total) by mouth every 12 (twelve) hours. 60 tablet 0  . morphine (MSIR) 15 MG tablet Take 1-2 tablets (15-30 mg total) by mouth every 4 (four) hours as needed for severe pain. 60 tablet 0  . Multiple Vitamin (MULTIVITAMIN WITH MINERALS) TABS tablet Take 1 tablet by mouth daily.    . ondansetron (ZOFRAN) 8 MG tablet Take 1-2 tablets (8-16 mg total) by mouth every 12 (twelve) hours as needed for nausea. 30 tablet 2  . oxybutynin (DITROPAN) 5 MG tablet Take 1 tablet (5 mg total) by mouth 3 (three) times daily. 90 tablet 3  . sodium chloride (OCEAN) 0.65 % nasal spray Place 1 spray into the nose as needed. Use every 1-3 hours while awake to keep nose moist, which helps to prevent nosebleeds    . acyclovir (ZOVIRAX) 200 MG capsule Take 1 capsule (200 mg total) by mouth 3 (three) times daily as needed. For fever blister (Patient not taking: Reported on 03/03/2015) 30 capsule 1  . diazepam (VALIUM) 2 MG tablet Take 1 mg by mouth daily as needed for anxiety.     Marland Kitchen LORazepam (ATIVAN) 0.5 MG tablet Take 1 tablet (0.5 mg total) by mouth every 6 (six) hours as needed (for nausea). (Patient not taking: Reported on 02/03/2015) 30 tablet 0  . olaparib (LYNPARZA) 50 MG capsule Take 8 capsules (400 mg total) by mouth 2 (two) times daily. Swallow whole. (Patient not  taking: Reported on 05/02/2015) 480 capsule 0   No current facility-administered medications for this visit.   Facility-Administered Medications Ordered in Other Visits  Medication Dose Route Frequency Provider Last Rate Last Dose  . sodium chloride 0.9 % injection 10 mL  10 mL Intravenous PRN Gordy Levan, MD   10 mL at 02/19/13 [id number]    Social History   Social History  . Marital Status: Divorced    Spouse Name: N/A  . Number of Children: 2  . 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 Smoking status: Never Smoker   . Smokeless tobacco: Never Used  . Alcohol Use: No  . Drug Use: No  . Sexual Activity: Not on file   Other Topics Concern  . Not on file   Social History Narrative   Gets reg exercise  Family History  Problem Relation Age of Onset  . Hypertension Mother   . Arthritis Mother   . Stroke Mother   . Hypertension Father   . Diabetes Father   . Lung cancer Father   . Hypertension Brother   . Diabetes Brother   . Skin cancer Sister   . Prostate cancer Paternal Grandfather   . Leukemia Maternal Aunt   . Stomach cancer Maternal Grandfather       Alvino Chapel, MD 05/02/2015, 10:43 AM

## 2015-05-02 NOTE — Patient Instructions (Addendum)
Plan to followup with Dr. Fermin Schwab in 3 months as scheduled.  Starting taking antibiotic prescribed today and you will receive a phone call with the urine culture results. Please call our office with any additional questions or concerns.

## 2015-05-03 LAB — URINE CULTURE

## 2015-05-05 ENCOUNTER — Telehealth: Payer: Self-pay

## 2015-05-05 NOTE — Telephone Encounter (Signed)
The UTI symptoms of pain and cloudyness are much better.

## 2015-05-07 ENCOUNTER — Other Ambulatory Visit: Payer: Self-pay | Admitting: Oncology

## 2015-05-07 ENCOUNTER — Encounter: Payer: Self-pay | Admitting: Pharmacist

## 2015-05-07 DIAGNOSIS — T8384XA Pain from genitourinary prosthetic devices, implants and grafts, initial encounter: Secondary | ICD-10-CM

## 2015-05-07 NOTE — Progress Notes (Signed)
..  Oral Chemotherapy Follow-Up Form  Original Start date of oral chemotherapy: _12/10/16____   Called patient today to follow up regarding patient's oral chemotherapy medication: _Lynparza (Olaparib)___  Pt is doing well today. No complaints. No issues with taking the 8 capsules twice daily. No missed doses. Feeling good. Patient returns for lab and visit with Dr. Marko Plume on Monday 05/12/15  Pt reports __0__ tablets/doses missed in the last week/month.    Pt reports the following side effects: ___none________   Will follow up and call patient again in _2 weeks_____   Thank you,  Montel Clock, PharmD, [facility]

## 2015-05-09 ENCOUNTER — Other Ambulatory Visit: Payer: Self-pay | Admitting: *Deleted

## 2015-05-09 ENCOUNTER — Telehealth: Payer: Self-pay | Admitting: *Deleted

## 2015-05-09 ENCOUNTER — Other Ambulatory Visit: Payer: Self-pay

## 2015-05-09 DIAGNOSIS — R300 Dysuria: Secondary | ICD-10-CM

## 2015-05-09 NOTE — Telephone Encounter (Signed)
Pt called and left message wanting to know re:  Pt has had kidney infection.  Pt would like to know if Dr. Marko Plume will recheck UA to make sure the infection has cleared when pt comes in for office visit on 12/19. Pt's  Phone   [phone].

## 2015-05-09 NOTE — Telephone Encounter (Signed)
Re recheck urine  Fine to add urine C&S to visit 12-19 if she has completed antibiotics by then  thanks

## 2015-05-09 NOTE — Telephone Encounter (Signed)
Called pt. No answer, but left a detailed  message for pt to call this nurse back at [phone] concerning the question she has about rechecking her urine. I will continue  to try to reach this pt if I don't hear from her.

## 2015-05-09 NOTE — Telephone Encounter (Signed)
Pt returned nurse's call concerning the request to have a UA & Culture done at her next appt on 12/19. Pt completed 5 days of Bactrim on 05/07/15. Pt tells this nurse, '' I still have some burning when urinating, but not much urgency or frequency". I told pt that we would collect a urine sample in the lab when she comes for her next appt per Dr. Marko Plume.

## 2015-05-11 ENCOUNTER — Other Ambulatory Visit: Payer: Self-pay | Admitting: Oncology

## 2015-05-11 DIAGNOSIS — C569 Malignant neoplasm of unspecified ovary: Secondary | ICD-10-CM

## 2015-05-11 DIAGNOSIS — N390 Urinary tract infection, site not specified: Secondary | ICD-10-CM

## 2015-05-12 ENCOUNTER — Other Ambulatory Visit (HOSPITAL_BASED_OUTPATIENT_CLINIC_OR_DEPARTMENT_OTHER): Payer: BC Managed Care – PPO

## 2015-05-12 ENCOUNTER — Other Ambulatory Visit: Payer: Self-pay

## 2015-05-12 ENCOUNTER — Ambulatory Visit (HOSPITAL_BASED_OUTPATIENT_CLINIC_OR_DEPARTMENT_OTHER): Payer: BC Managed Care – PPO | Admitting: Oncology

## 2015-05-12 ENCOUNTER — Telehealth: Payer: Self-pay | Admitting: Oncology

## 2015-05-12 ENCOUNTER — Ambulatory Visit (HOSPITAL_BASED_OUTPATIENT_CLINIC_OR_DEPARTMENT_OTHER): Payer: BC Managed Care – PPO

## 2015-05-12 ENCOUNTER — Encounter: Payer: Self-pay | Admitting: Oncology

## 2015-05-12 VITALS — BP 137/75 | HR 104 | Temp 98.0°F | Resp 18 | Ht 62.0 in | Wt 111.8 lb

## 2015-05-12 DIAGNOSIS — N390 Urinary tract infection, site not specified: Secondary | ICD-10-CM

## 2015-05-12 DIAGNOSIS — C772 Secondary and unspecified malignant neoplasm of intra-abdominal lymph nodes: Secondary | ICD-10-CM

## 2015-05-12 DIAGNOSIS — C569 Malignant neoplasm of unspecified ovary: Secondary | ICD-10-CM

## 2015-05-12 DIAGNOSIS — T451X5A Adverse effect of antineoplastic and immunosuppressive drugs, initial encounter: Secondary | ICD-10-CM

## 2015-05-12 DIAGNOSIS — Z1509 Genetic susceptibility to other malignant neoplasm: Secondary | ICD-10-CM

## 2015-05-12 DIAGNOSIS — I890 Lymphedema, not elsewhere classified: Secondary | ICD-10-CM

## 2015-05-12 DIAGNOSIS — G893 Neoplasm related pain (acute) (chronic): Secondary | ICD-10-CM

## 2015-05-12 DIAGNOSIS — D6481 Anemia due to antineoplastic chemotherapy: Secondary | ICD-10-CM

## 2015-05-12 DIAGNOSIS — D649 Anemia, unspecified: Secondary | ICD-10-CM

## 2015-05-12 DIAGNOSIS — Z95828 Presence of other vascular implants and grafts: Secondary | ICD-10-CM

## 2015-05-12 DIAGNOSIS — C7802 Secondary malignant neoplasm of left lung: Secondary | ICD-10-CM | POA: Diagnosis not present

## 2015-05-12 DIAGNOSIS — C787 Secondary malignant neoplasm of liver and intrahepatic bile duct: Secondary | ICD-10-CM | POA: Diagnosis not present

## 2015-05-12 DIAGNOSIS — Z79899 Other long term (current) drug therapy: Secondary | ICD-10-CM

## 2015-05-12 DIAGNOSIS — C801 Malignant (primary) neoplasm, unspecified: Secondary | ICD-10-CM | POA: Diagnosis not present

## 2015-05-12 DIAGNOSIS — D6959 Other secondary thrombocytopenia: Secondary | ICD-10-CM

## 2015-05-12 DIAGNOSIS — Z1501 Genetic susceptibility to malignant neoplasm of breast: Secondary | ICD-10-CM

## 2015-05-12 DIAGNOSIS — R634 Abnormal weight loss: Secondary | ICD-10-CM

## 2015-05-12 DIAGNOSIS — D696 Thrombocytopenia, unspecified: Secondary | ICD-10-CM

## 2015-05-12 DIAGNOSIS — C7801 Secondary malignant neoplasm of right lung: Secondary | ICD-10-CM

## 2015-05-12 DIAGNOSIS — G620 Drug-induced polyneuropathy: Secondary | ICD-10-CM

## 2015-05-12 DIAGNOSIS — R300 Dysuria: Secondary | ICD-10-CM

## 2015-05-12 LAB — COMPREHENSIVE METABOLIC PANEL
ALBUMIN: 2.5 g/dL — AB (ref 3.5–5.0)
ALK PHOS: 133 U/L (ref 40–150)
ALT: 24 U/L (ref 0–55)
ANION GAP: 10 meq/L (ref 3–11)
AST: 48 U/L — ABNORMAL HIGH (ref 5–34)
BUN: 13.2 mg/dL (ref 7.0–26.0)
CALCIUM: 8.8 mg/dL (ref 8.4–10.4)
CO2: 21 mEq/L — ABNORMAL LOW (ref 22–29)
Chloride: 102 mEq/L (ref 98–109)
Creatinine: 1 mg/dL (ref 0.6–1.1)
EGFR: 61 mL/min/{1.73_m2} — AB (ref >90)
GLUCOSE: 101 mg/dL (ref 70–140)
POTASSIUM: 3.9 meq/L (ref 3.5–5.1)
SODIUM: 133 meq/L — AB (ref 136–145)
Total Bilirubin: 0.69 mg/dL (ref 0.20–1.20)
Total Protein: 6.1 g/dL — ABNORMAL LOW (ref 6.4–8.3)

## 2015-05-12 LAB — CBC WITH DIFFERENTIAL/PLATELET
BASO%: 0.3 % (ref 0.0–2.0)
BASOS ABS: 0 10*3/uL (ref 0.0–0.1)
EOS ABS: 0 10*3/uL (ref 0.0–0.5)
EOS%: 0.4 % (ref 0.0–7.0)
HCT: 29.6 % — ABNORMAL LOW (ref 34.8–46.6)
HEMOGLOBIN: 9.5 g/dL — AB (ref 11.6–15.9)
LYMPH%: 5 % — ABNORMAL LOW (ref 14.0–49.7)
MCH: 27.1 pg (ref 25.1–34.0)
MCHC: 32 g/dL (ref 31.5–36.0)
MCV: 84.7 fL (ref 79.5–101.0)
MONO#: 0.2 10*3/uL (ref 0.1–0.9)
MONO%: 3.2 % (ref 0.0–14.0)
NEUT#: 6.1 10*3/uL (ref 1.5–6.5)
NEUT%: 91.1 % — ABNORMAL HIGH (ref 38.4–76.8)
PLATELETS: 102 10*3/uL — AB (ref 145–400)
RBC: 3.49 10*6/uL — ABNORMAL LOW (ref 3.70–5.45)
RDW: 17.5 % — AB (ref 11.2–14.5)
WBC: 6.7 10*3/uL (ref 3.9–10.3)
lymph#: 0.3 10*3/uL — ABNORMAL LOW (ref 0.9–3.3)

## 2015-05-12 LAB — CA 125: CA 125: 41591 U/mL — AB (ref <35)

## 2015-05-12 MED ORDER — SODIUM CHLORIDE 0.9 % IJ SOLN
10.0000 mL | INTRAMUSCULAR | Status: DC | PRN
  Administered 2015-05-12: 10 mL via INTRAVENOUS
  Filled 2015-05-12: qty 10

## 2015-05-12 MED ORDER — MORPHINE SULFATE ER 30 MG PO TBCR: 30.0000 mg | EXTENDED_RELEASE_TABLET | Freq: Two times a day (BID) | ORAL | Status: DC

## 2015-05-12 MED ORDER — PANTOPRAZOLE SODIUM 40 MG PO TBEC: 40.0000 mg | DELAYED_RELEASE_TABLET | Freq: Every day | ORAL | Status: AC

## 2015-05-12 MED ORDER — HEPARIN SOD (PORK) LOCK FLUSH 100 UNIT/ML IV SOLN
500.0000 [IU] | Freq: Once | INTRAVENOUS | Status: AC
  Administered 2015-05-12: 500 [IU] via INTRAVENOUS
  Filled 2015-05-12: qty 5

## 2015-05-12 NOTE — Progress Notes (Signed)
OFFICE PROGRESS NOTE   May 12, 2015   Physicians:ClarkePearson, Quillian Quince; Evalee Mutton, Marlene Bast, Floy Sabina, Annie Main.; Gery Pray.   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 Daughter particularly, and also patient, did not want to stop treatment attempts when we discussed possible Hospice referral at my last visit. As she has been recently found BRCA 2 positive, she elected to begin olaparib. [facility] oral chemotherapy pharmacist did education and assisted in obtaining the drug, which she began on 05-03-15. She saw Dr Josephina Shih for second opinion on 05-02-15, with discussion of PARP inhibitor, as well as other regimens that she has not used (oral etoposide, oral cytoxan + avastin, weekly taxol or taxotere).  Patient was treated with 5 days of Septra for possible UTI beginning 05-02-15, culture finalized with multiple species. She continues with bladder discomfort including pain, burning, frequency during day, no fever, less frequency at night, no gross hematuria. She had right flank pain so severe last week that she had to call daughter home from work at [employer]. The right flank pain has persisted tho not that severe, worse when standing or walking, or with much activity at home. She continues Myrbetriq and ditropan by Dr Diona Fanti.  Stent is due change in Feb (1 year), however patient feels these symptoms may be stent related and may need this changed earlier. I have spoken with [facility] now, NP able to see her on 05-14-15.   Patient had tolerate full dose olaparib (8 capsules bid) without much difficulty until more fatigue past 2 days and vomiting x 1 this AM; she did not take the olaparib this AM. She denies increased SOB, cough, chest pain. She has new GERD which has been bothersome, not on any medications for this. Bowel movements have been  normal via colostomy. RLE swelling is at baseline, otherwise no increased peripheral edema since starting olaparib. She continues MS Contin 45 mg bid with prn MSIR, low pelvic discomfort remains better than prior to radiation. No other bleeding. No stomatitis.  No problems with PAC. Remainder of 10 point Review of Systems unchanged.   PAC in, flushed 03-31-15 Right JJ stent Genetics testing.sent 02-19-15 and revealed a mutation in the BRCA2 gene called [id number]. (BreastOvarian panel by [facility]) CA 125 at diagnosis 5626  Flu vaccine 03-03-15  ONCOLOGIC HISTORY Patient presented with RLE swelling negative for DVT July 2014, and right hydronephrosis. Biopsy of left inguinal node ([id number]) found carcinoma consistent with serous gyn primary.Neoadjuvant dose dense taxol carboplatin began 01-31-13, with improvement in pelvic mass and adenopathy by scans after 3 cycles.. She had 3 additional cycles, with progressive decrease in CA 125 from 5626 in 12-2012 to 596 by Jan 2015. CT 06-14-13 had ~ stable pelvic mass and adenopathy. Dr Josephina Shih recommended another 3 cycles chemotherapy, regimen changed to q 3 week taxol carboplatin on 06-27-13, which unfortunately caused worsening of peripheral neuropathy and severe taxol aches.Taxotere was substituted for taxol due to neuropathy in feet for cycles 8 and 9, completed 08-08-13. CA 125 improved to low of 494 in Feb 2015, then 732 just prior to cycle 9. CT AP 08-22-13 had minimal improvement, still not adequate to consider debulking surgery. She received doxil for 6 cycles, from 09-19-13 thru 02-06-14. CT AP 02-27-14 had some early increase in central pelvic mass, with treatment changed to gemzar beginning 03-29-14. CA 125 baseline for gemzar was 1932 on 03-27-14. Cycle  4 gemzar was given 05-22-14, with CA 125 2680. CDDP was added to gemzar beginning 06-05-14. She had cycle 5 CDDP gemzar on 09-04-14. Treatment changed to tamoxifen 09-30-14 due to poor tolerance of CDDP gemzar  without clinical improvement. CT AP 12-13-14 compared with 06-2014 had some increase in the pelvic mass, but no new areas of involvement. Radiation treatment dates: 01/15/2015-02/19/2015 Site/dose: Pelvic mass, 45 gray in 25 fractions.  CT 03-25-15 had new lung and liver metastases. BRCA 2 mutation documented, olaparib begun 05-03-15.   Objective:  Vital signs in last 24 hours:  BP 137/75 mmHg  Pulse 104  Temp(Src) 98 F (36.7 C) (Oral)  Resp 18  Ht _0  (1.575 m)  Wt 111 lb 12.8 oz (50.712 kg)  BMI 20.44 kg/m2  SpO2 97% Weight down 7 lbs from 04-15-15 Alert, oriented and appropriate. Ambulatory without assistance. Looks pale, fatigued, chronically ill, still very pleasant as always, not in acute distress. Respirations not labored RA.  No alopecia  HEENT:PERRL, sclerae not icteric. Oral mucosa moist without lesions, posterior pharynx clear.  Neck supple. No JVD.  Lymphatics:no cervical,supraclavicular adenopathy Resp: clear to auscultation bilaterally and normal percussion bilaterally Cardio: tachy, regular rate and rhythm. No gallop. GI: soft, nontender, not distended. Some bowel sounds. Surgical incision not remarkable. Ostomy not remarkable Musculoskeletal/ Extremities: RLE tight swelling to thigh without pitting edema, cords, tenderness Neuro: no change LE neuropathy. Otherwise nonfocal. PSYCH appropriate mood and affect Skin without rash, ecchymosis, petechiae Portacath-without erythema or tenderness  Lab Results:  Results for orders placed or performed in visit on 05/12/15  CBC with Differential  Result Value Ref Range   WBC 6.7 3.9 - 10.3 10e3/uL   NEUT# 6.1 1.5 - 6.5 10e3/uL   HGB 9.5 (L) 11.6 - 15.9 g/dL   HCT 29.6 (L) 34.8 - 46.6 %   Platelets 102 (L) 145 - 400 10e3/uL   MCV 84.7 79.5 - 101.0 fL   MCH 27.1 25.1 - 34.0 pg   MCHC 32.0 31.5 - 36.0 g/dL   RBC 3.49 (L) 3.70 - 5.45 10e6/uL   RDW 17.5 (H) 11.2 - 14.5 %   lymph# 0.3 (L) 0.9 - 3.3 10e3/uL   MONO# 0.2  0.1 - 0.9 10e3/uL   Eosinophils Absolute 0.0 0.0 - 0.5 10e3/uL   Basophils Absolute 0.0 0.0 - 0.1 10e3/uL   NEUT% 91.1 (H) 38.4 - 76.8 %   LYMPH% 5.0 (L) 14.0 - 49.7 %   MONO% 3.2 0.0 - 14.0 %   EOS% 0.4 0.0 - 7.0 %   BASO% 0.3 0.0 - 2.0 %  Comprehensive metabolic panel  Result Value Ref Range   Sodium 133 (L) 136 - 145 mEq/L   Potassium 3.9 3.5 - 5.1 mEq/L   Chloride 102 98 - 109 mEq/L   CO2 21 (L) 22 - 29 mEq/L   Glucose 101 70 - 140 mg/dl   BUN 13.2 7.0 - 26.0 mg/dL   Creatinine 1.0 0.6 - 1.1 mg/dL   Total Bilirubin 0.69 0.20 - 1.20 mg/dL   Alkaline Phosphatase 133 40 - 150 U/L   AST 48 (H) 5 - 34 U/L   ALT 24 0 - 55 U/L   Total Protein 6.1 (L) 6.4 - 8.3 g/dL   Albumin 2.5 (L) 3.5 - 5.0 g/dL   Calcium 8.8 8.4 - 10.4 mg/dL   Anion Gap 10 3 - 11 mEq/L   EGFR 61 (L) >90 ml/min/1.73 m2    Follow up urine culture sent and pending  Hemoglobin  03-31-15 was 11.1 with platelets 292  CA 125 available after visit 41591, this having been 8511 on 03-03-15 and 3202 in 10-2014  Iron in 08-2014: serum iron 45 and %sat 24   Studies/Results:  No results found.  Medications: I have reviewed the patient's current medications. WIth hemoglobin and platelets both down, will hold olaparib until recheck in 7-10-days Refilled MSContin 30 mg tablets (has 15 mg contin)  Begin protonix 40 mg daily for GERD.   DISCUSSION: Bladder symptoms and right flank pain sound stent related. Will follow up urine culture pending today, tho culture from 05-02-15 no specific single organism. Appreciate urology seeing her so quickly, set up for NP on 05-14-15 with Dr Diona Fanti in office that day.  Difficult to sort out symptoms possibly related to new olaparib from the urologic issues, however hemoglobin and platelets lower than last counts. Patient is in full agreement with holding that medication for next several days; I will see her back with repeat counts in ~ a week. Patient states "if I have to go into  hospital, this medicine is not worth it".      Assessment/Plan:  1. Metastatic serous gyn carcinoma  involving liver and lungs: heavily treated, poor tolerance for recent chemo attempts, did benefit symptomatically from pelvic RT. She / and especially family preferred to continue some treatment in preference to strict comfort care. On olaparib since 05-03-15 as she was recently found BRCA 2 +, which will be held now with drop in hemoglobin and platelets. I will see her back in ~ a week.  2.Right hydronephrosis with JJ stent: bladder and right flank discomfort may be stent related. To see urology 05-14-15. Continue pain medication 3.history of ulcerative colitis post proctocolectomy with ileostomy remotely. The ileostomy helped her tolerate pelvic RT and has avoided narcotic constipation. 4.previous UTIs: symptoms no better with 5 days septra, culture pending today. 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 Progressive weight loss: despite ongoing efforts, related to metastatic cancer. 10. Flu vaccine 03-03-15 11.Advance directives in place: daughter and son HCPOA, limited interventions including hospitalization but not including intubation, mechanical ventilation, intensive care unit. 12.GERD symptoms new: may be related to olaparib. Add protonix daily  All questions answered and patient / daughter are in agreement with recommendations and plans. Time spent 30 min including >50% counseling and coordination of care. Cc Dr Diona Fanti  Gordy Levan, MD   05/12/2015, 1:22 PM

## 2015-05-12 NOTE — Telephone Encounter (Signed)
Appointments made and avs printed for patient °

## 2015-05-12 NOTE — Patient Instructions (Signed)
Nurse practitioner Jiles Crocker Wed 12-21 at 9 AM.  Dr Diona Fanti is in office that day to discuss with Mr Kirsten Schroeder.  Hold olaparib starting 12-19 AM until blood counts rechecked and we let you know to resume.  Start medicine for reflux  Once daily   This will be protonix or generic or equivalent, depending on insurance  Call if more fatigue or other problems before we repeat CBC ~ Dec 27

## 2015-05-12 NOTE — Patient Instructions (Signed)

## 2015-05-14 ENCOUNTER — Telehealth: Payer: Self-pay

## 2015-05-14 ENCOUNTER — Other Ambulatory Visit: Payer: Self-pay | Admitting: Oncology

## 2015-05-14 NOTE — Telephone Encounter (Signed)
Faxed office note from 04-15-15, labs from 05-12-15 appointment' Urine culture from 05-02-15. 05-12-15 culture not resulted, and Dr. Mariana Kaufman office note from 05-12-15 not completed.

## 2015-05-14 NOTE — Telephone Encounter (Signed)
Patient Demographics     Patient Name Sex DOB SSN Address Phone    Kirsten Schroeder, [place] Female [id number] [place] [address] [phone] (Home)      Message  Received: 2 days ago    Gordy Levan, MD  Baruch Merl, RN           She will see urology Jiles Crocker NP with [facility] on Wed 12-21. Need to send them urine cx from 12-9 and whatever results from 12-19. Also my last note and hopefully note from 12-19 if ready  Thank you

## 2015-05-15 LAB — URINE CULTURE

## 2015-05-16 ENCOUNTER — Telehealth: Payer: Self-pay

## 2015-05-16 DIAGNOSIS — N390 Urinary tract infection, site not specified: Principal | ICD-10-CM

## 2015-05-16 DIAGNOSIS — B952 Enterococcus as the cause of diseases classified elsewhere: Secondary | ICD-10-CM

## 2015-05-16 MED ORDER — NITROFURANTOIN MONOHYD MACRO 100 MG PO CAPS: 100.0000 mg | ORAL_CAPSULE | Freq: Two times a day (BID) | ORAL | Status: DC

## 2015-05-16 NOTE — Telephone Encounter (Signed)
Ms. Autin requested to see the dietitian Ms. Neff to see if she could recommend a protein bar without nuts.  Told Ms. Scribner that a message would be sent to [employer].  Maybe Ms. Cordella Register could speak with her on the phone or set up an appointment around her visit on 05-22-15 with Dr. Marko Plume.

## 2015-05-16 NOTE — Telephone Encounter (Signed)
-----   Message from Gordy Levan, MD sent at 05/16/2015 11:58 AM EST ----- Labs seen and need follow up  Please let her know she does have UTI on this culture. Needs 7 days macrobid 100 mg bid x 7 days (but need to ask if urology put her on anything at their visit ~ 12-21)

## 2015-05-16 NOTE — Telephone Encounter (Signed)
Spoke with Triage Nurse Kirsten Schroeder at [facility] urology.  Kirsten Drown, NP did not begin Kirsten Schroeder on any ATB at visit 05-14-15.Marland Kitchen A urine culture was done but has not resulted. Told Kirsten Schroeder that the culture from 05-12-15 visit with Kirsten Schroeder resulted Enterococcus.  Kirsten Schroeder is giving Kirsten Schroeder Macrobid 100 mg bid x 7 days. Kirsten Schroeder will notify Kirsten Crocker, NP of this result. Faxed result to [facility] at [phone]. Kirsten Schroeder notified of the above and that the macrobid was sent to her pharmacy.

## 2015-05-21 ENCOUNTER — Other Ambulatory Visit: Payer: Self-pay

## 2015-05-21 ENCOUNTER — Other Ambulatory Visit: Payer: Self-pay | Admitting: Oncology

## 2015-05-21 DIAGNOSIS — C569 Malignant neoplasm of unspecified ovary: Secondary | ICD-10-CM

## 2015-05-21 DIAGNOSIS — C772 Secondary and unspecified malignant neoplasm of intra-abdominal lymph nodes: Secondary | ICD-10-CM

## 2015-05-22 ENCOUNTER — Telehealth: Payer: Self-pay | Admitting: Oncology

## 2015-05-22 ENCOUNTER — Encounter: Payer: Self-pay | Admitting: Oncology

## 2015-05-22 ENCOUNTER — Ambulatory Visit (HOSPITAL_BASED_OUTPATIENT_CLINIC_OR_DEPARTMENT_OTHER): Payer: BC Managed Care – PPO | Admitting: Oncology

## 2015-05-22 ENCOUNTER — Other Ambulatory Visit: Payer: BC Managed Care – PPO

## 2015-05-22 ENCOUNTER — Ambulatory Visit (HOSPITAL_BASED_OUTPATIENT_CLINIC_OR_DEPARTMENT_OTHER): Payer: BC Managed Care – PPO

## 2015-05-22 ENCOUNTER — Other Ambulatory Visit (HOSPITAL_BASED_OUTPATIENT_CLINIC_OR_DEPARTMENT_OTHER): Payer: BC Managed Care – PPO

## 2015-05-22 ENCOUNTER — Telehealth: Payer: Self-pay | Admitting: Nutrition

## 2015-05-22 VITALS — BP 115/62 | HR 99 | Temp 99.3°F | Resp 18 | Ht 62.0 in | Wt 106.8 lb

## 2015-05-22 DIAGNOSIS — B952 Enterococcus as the cause of diseases classified elsewhere: Secondary | ICD-10-CM

## 2015-05-22 DIAGNOSIS — B3781 Candidal esophagitis: Secondary | ICD-10-CM

## 2015-05-22 DIAGNOSIS — C7802 Secondary malignant neoplasm of left lung: Secondary | ICD-10-CM

## 2015-05-22 DIAGNOSIS — C772 Secondary and unspecified malignant neoplasm of intra-abdominal lymph nodes: Secondary | ICD-10-CM

## 2015-05-22 DIAGNOSIS — R634 Abnormal weight loss: Secondary | ICD-10-CM

## 2015-05-22 DIAGNOSIS — C569 Malignant neoplasm of unspecified ovary: Secondary | ICD-10-CM

## 2015-05-22 DIAGNOSIS — N1330 Unspecified hydronephrosis: Secondary | ICD-10-CM

## 2015-05-22 DIAGNOSIS — C787 Secondary malignant neoplasm of liver and intrahepatic bile duct: Secondary | ICD-10-CM

## 2015-05-22 DIAGNOSIS — D696 Thrombocytopenia, unspecified: Secondary | ICD-10-CM

## 2015-05-22 DIAGNOSIS — C801 Malignant (primary) neoplasm, unspecified: Secondary | ICD-10-CM

## 2015-05-22 DIAGNOSIS — G620 Drug-induced polyneuropathy: Secondary | ICD-10-CM

## 2015-05-22 DIAGNOSIS — C7801 Secondary malignant neoplasm of right lung: Secondary | ICD-10-CM | POA: Diagnosis not present

## 2015-05-22 DIAGNOSIS — Z95828 Presence of other vascular implants and grafts: Secondary | ICD-10-CM

## 2015-05-22 DIAGNOSIS — B370 Candidal stomatitis: Secondary | ICD-10-CM

## 2015-05-22 DIAGNOSIS — D649 Anemia, unspecified: Secondary | ICD-10-CM

## 2015-05-22 DIAGNOSIS — D6481 Anemia due to antineoplastic chemotherapy: Secondary | ICD-10-CM

## 2015-05-22 DIAGNOSIS — N390 Urinary tract infection, site not specified: Secondary | ICD-10-CM

## 2015-05-22 DIAGNOSIS — T451X5A Adverse effect of antineoplastic and immunosuppressive drugs, initial encounter: Secondary | ICD-10-CM

## 2015-05-22 DIAGNOSIS — I890 Lymphedema, not elsewhere classified: Secondary | ICD-10-CM

## 2015-05-22 DIAGNOSIS — G893 Neoplasm related pain (acute) (chronic): Secondary | ICD-10-CM

## 2015-05-22 LAB — CBC WITH DIFFERENTIAL/PLATELET
BASO%: 0.3 % (ref 0.0–2.0)
BASOS ABS: 0 10*3/uL (ref 0.0–0.1)
EOS%: 0.5 % (ref 0.0–7.0)
Eosinophils Absolute: 0 10*3/uL (ref 0.0–0.5)
HCT: 28.1 % — ABNORMAL LOW (ref 34.8–46.6)
HGB: 9 g/dL — ABNORMAL LOW (ref 11.6–15.9)
LYMPH%: 11.6 % — AB (ref 14.0–49.7)
MCH: 27.2 pg (ref 25.1–34.0)
MCHC: 32 g/dL (ref 31.5–36.0)
MCV: 84.9 fL (ref 79.5–101.0)
MONO#: 0.8 10*3/uL (ref 0.1–0.9)
MONO%: 10.7 % (ref 0.0–14.0)
NEUT#: 5.9 10*3/uL (ref 1.5–6.5)
NEUT%: 76.9 % — AB (ref 38.4–76.8)
Platelets: 379 10*3/uL (ref 145–400)
RBC: 3.31 10*6/uL — AB (ref 3.70–5.45)
RDW: 17.1 % — ABNORMAL HIGH (ref 11.2–14.5)
WBC: 7.7 10*3/uL (ref 3.9–10.3)
lymph#: 0.9 10*3/uL (ref 0.9–3.3)

## 2015-05-22 LAB — COMPREHENSIVE METABOLIC PANEL
ALK PHOS: 150 U/L (ref 40–150)
ALT: 13 U/L (ref 0–55)
AST: 26 U/L (ref 5–34)
Albumin: 2.6 g/dL — ABNORMAL LOW (ref 3.5–5.0)
Anion Gap: 10 mEq/L (ref 3–11)
BILIRUBIN TOTAL: 0.55 mg/dL (ref 0.20–1.20)
BUN: 8 mg/dL (ref 7.0–26.0)
CALCIUM: 8.5 mg/dL (ref 8.4–10.4)
CHLORIDE: 97 meq/L — AB (ref 98–109)
CO2: 24 mEq/L (ref 22–29)
Creatinine: 0.9 mg/dL (ref 0.6–1.1)
EGFR: 71 mL/min/{1.73_m2} — ABNORMAL LOW (ref >90)
Glucose: 125 mg/dl (ref 70–140)
POTASSIUM: 3.5 meq/L (ref 3.5–5.1)
SODIUM: 131 meq/L — AB (ref 136–145)
Total Protein: 6.2 g/dL — ABNORMAL LOW (ref 6.4–8.3)

## 2015-05-22 MED ORDER — SODIUM CHLORIDE 0.9 % IJ SOLN
10.0000 mL | INTRAMUSCULAR | Status: DC | PRN
  Administered 2015-05-22: 10 mL via INTRAVENOUS
  Filled 2015-05-22: qty 10

## 2015-05-22 MED ORDER — FLUCONAZOLE 100 MG PO TABS: 100.0000 mg | ORAL_TABLET | Freq: Every day | ORAL | Status: DC

## 2015-05-22 MED ORDER — HEPARIN SOD (PORK) LOCK FLUSH 100 UNIT/ML IV SOLN
500.0000 [IU] | Freq: Once | INTRAVENOUS | Status: AC
  Administered 2015-05-22: 500 [IU] via INTRAVENOUS
  Filled 2015-05-22: qty 5

## 2015-05-22 NOTE — Progress Notes (Signed)
OFFICE PROGRESS NOTE   May 22, 2015   Physicians:ClarkePearson, Quillian Quince; Evalee Mutton, Marlene Bast, Floy Sabina, Annie Main.; Gery Pray.   INTERVAL HISTORY:  Patient is seen, together with daughter and sister, in continuing attention to progressive metastatic gyn carcinoma involving liver and lungs. She is clinically declining, tho mostly comfortable; she and daughter have not wanted to discontinue treatment.  She continues macrobid for enterococcus UTI from culture 05-12-15, the antibiotic begun 05-16-15 and to complete a week tomorrow. She has a second week's course of macrobid from urology available. She is to see Dr Diona Fanti again on 06-11-15 in follow up of right ureteral stent, which is due change ~ Feb 2017.  She was most recently treated with palliative radiation to pelvis thru 02-19-15, with symptomatic benefit. She took olaparib at 400 mg bid from 05-03-15 thru 05-11-15, held since then due to new cytopenias on 05-12-15. Patient has not felt any better since holding olaparib; she has had no bleeding and does not seem more symptomatic from anemia. Appetite and po intake remain poor, tho she is able to eat crackers, peanut butter, mashed potatoes. Some nausea, better with regular zofran. Pain control is adequate with MSContin 45 mg every 12 hrs and prn MSIR which she does use at hs. She has used protonix for GERD with slight improvement and will continue. Bowels are moving regularly via colostomy, not black or tarry. She denies increased SOB, chest pain, cough. She denies fever or shaking chills. She is also taking uribel samples for bladder symptoms, will change to OTC AZO when samples completed. She has some stomatitis, trying gentle toothpaste. No change swelling RLE. No problems with PAC, flushed today. Remainder of 10 point Review of Systems negative/ unchanged.   PAC in, flushed 05-22-15 Right JJ stent Genetics testing.sent 02-19-15 and revealed a mutation in the  BRCA2 gene c.7618-1G>A. (BreastOvarian panel by [facility]) CA 125 at diagnosis 5626  Flu vaccine 03-03-15  ONCOLOGIC HISTORY Patient presented with RLE swelling negative for DVT July 2014, and right hydronephrosis. Biopsy of left inguinal node ([id number]) found carcinoma consistent with serous gyn primary.Neoadjuvant dose dense taxol carboplatin began 01-31-13, with improvement in pelvic mass and adenopathy by scans after 3 cycles.. She had 3 additional cycles, with progressive decrease in CA 125 from 5626 in 12-2012 to 596 by Jan 2015. CT 06-14-13 had ~ stable pelvic mass and adenopathy. Dr Josephina Shih recommended another 3 cycles chemotherapy, regimen changed to q 3 week taxol carboplatin on 06-27-13, which unfortunately caused worsening of peripheral neuropathy and severe taxol aches.Taxotere was substituted for taxol due to neuropathy in feet for cycles 8 and 9, completed 08-08-13. CA 125 improved to low of 494 in Feb 2015, then 732 just prior to cycle 9. CT AP 08-22-13 had minimal improvement, still not adequate to consider debulking surgery. She received doxil for 6 cycles, from 09-19-13 thru 02-06-14. CT AP 02-27-14 had some early increase in central pelvic mass, with treatment changed to gemzar beginning 03-29-14. CA 125 baseline for gemzar was 1932 on 03-27-14. Cycle 4 gemzar was given 05-22-14, with CA 125 2680. CDDP was added to gemzar beginning 06-05-14. She had cycle 5 CDDP gemzar on 09-04-14. Treatment changed to tamoxifen 09-30-14 due to poor tolerance of CDDP gemzar without clinical improvement. CT AP 12-13-14 compared with 06-2014 had some increase in the pelvic mass, but no new areas of involvement. Radiation treatment dates: 01/15/2015-02/19/2015 Site/dose: Pelvic mass, 45 gray in 25 fractions.  CT 03-25-15 had new lung and liver metastases. BRCA 2  mutation documented, olaparib begun 05-03-15.   Objective:  Vital signs in last 24 hours:  BP 115/62 mmHg  Pulse 99  Temp(Src) 99.3 F (37.4 C) (Oral)   Resp 18  Ht 5' 2"  (1.575 m)  Wt 106 lb 12.8 oz (48.444 kg)  BMI 19.53 kg/m2  SpO2 100% Weight down 5 more lbs from 05-12-15 and 12 lbs from Sept. Appears cachectic. Respirations not labored RA in exam room. Pale. Sits on cushion.  Alert, oriented and appropriate, very pleasant as always. Ambulatory slowly without assistance.  Partial alopecia  HEENT:PERRL, sclerae not icteric. Oral mucosa moist, posterior pharynx clear. Raw areas posterior buccal mucosa behind posterior molars, with thrush No JVD.  Lymphatics:no supraclavicular adenopathy Resp: somewhat diminished BS thruout otherwise clear to auscultation bilaterally and normal percussion bilaterally Cardio: regular rate and rhythm. No gallop. GI: soft, nontender, not distended, colostomy with brownish stool. Some bowel sounds.  Musculoskeletal/ Extremities: RLE 2+ swelling, LLE without pitting edema, cords, tenderness Neuro:  peripheral neuropathy unchanged. Otherwise nonfocal. Psych appropriate mood and affect Skin without rash, ecchymosis, petechiae Portacath-without erythema or tenderness  Lab Results:  Results for orders placed or performed in visit on 05/22/15  CBC with Differential  Result Value Ref Range   WBC 7.7 3.9 - 10.3 10e3/uL   NEUT# 5.9 1.5 - 6.5 10e3/uL   HGB 9.0 (L) 11.6 - 15.9 g/dL   HCT 28.1 (L) 34.8 - 46.6 %   Platelets 379 145 - 400 10e3/uL   MCV 84.9 79.5 - 101.0 fL   MCH 27.2 25.1 - 34.0 pg   MCHC 32.0 31.5 - 36.0 g/dL   RBC 3.31 (L) 3.70 - 5.45 10e6/uL   RDW 17.1 (H) 11.2 - 14.5 %   lymph# 0.9 0.9 - 3.3 10e3/uL   MONO# 0.8 0.1 - 0.9 10e3/uL   Eosinophils Absolute 0.0 0.0 - 0.5 10e3/uL   Basophils Absolute 0.0 0.0 - 0.1 10e3/uL   NEUT% 76.9 (H) 38.4 - 76.8 %   LYMPH% 11.6 (L) 14.0 - 49.7 %   MONO% 10.7 0.0 - 14.0 %   EOS% 0.5 0.0 - 7.0 %   BASO% 0.3 0.0 - 2.0 %  Comprehensive metabolic panel  Result Value Ref Range   Sodium 131 (L) 136 - 145 mEq/L   Potassium 3.5 3.5 - 5.1 mEq/L   Chloride 97  (L) 98 - 109 mEq/L   CO2 24 22 - 29 mEq/L   Glucose 125 70 - 140 mg/dl   BUN 8.0 7.0 - 26.0 mg/dL   Creatinine 0.9 0.6 - 1.1 mg/dL   Total Bilirubin 0.55 0.20 - 1.20 mg/dL   Alkaline Phosphatase 150 40 - 150 U/L   AST 26 5 - 34 U/L   ALT 13 0 - 55 U/L   Total Protein 6.2 (L) 6.4 - 8.3 g/dL   Albumin 2.6 (L) 3.5 - 5.0 g/dL   Calcium 8.5 8.4 - 10.4 mg/dL   Anion Gap 10 3 - 11 mEq/L   EGFR 71 (L) >90 ml/min/1.73 m2    CA 125 from 05-12-15 was 41,591 previously 8511 on 03-03-15 Studies/Results:  No results found.  Medications: I have reviewed the patient's current medications. If UTI symptoms are resolved when she completes 7 days macrobid, she will DC then. If still symptoms, she will continue a second week of the macrobid.  Diflucan for oral thrush First dose reduction of olaparib would be to 200 mg bid (= 4 of the 50 mg tablets bid).   DISCUSSION If off  macrobid after completing 7 days, will have urine culture at [facility] on 05-27-15; she will have repeat CBC and iron studies also 1-3. If she has continued the second week of macrobid, will not get urine culture 1-3 at this office and she will  Instead follow this up at urology appointment 1-18.  Discussed improvement in platelets since holding olaparib, tho further drop in hemoglobin; not entirely clear that these are related to the drug. Will check iron studies again, these not low in April 2016.   I am reluctant to resume olaparib even at reduced dose, given other problems now. Patient prefers to wait until  " after this infection and after the stent is changed" before resuming olaparib; daughter does not seem opposed to this.  Note at last visit patient stated that if she had to be hospitalized due to olaparib "it would not be worth it". I have said again that performance status has to be adequate to tolerate any treatment. Discussion is again difficult due to significant denial at least on part of daughter.       Assessment/Plan:  1. 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 She / and especially family preferred to continue some treatment in preference to strict comfort care. Took olaparib from 12-10 thru 05-11-15, held since then for lower counts.  Progressive weight loss continues and PS too poor to tolerate treatment side effects now, so will continue to hold olaparib. I will see her back on 06-16-15. Will follow counts. Continue pain medication. She would be hospice appropriate whenever she and family agree/ when no longer treating systemically. 2.Right hydronephrosis with JJ stent and enterococcus UTI from culture 05-12-15. Macrobid and urology follow up as above. 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 The ileostomy helped her tolerate pelvic RT and has avoided narcotic constipation. 4.progressive anemia: no clear blood loss, may be from olaparaib, will check iron studies. Thrombocytopenia improved, may have been from olaparib.  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 Progressive weight loss: despite megace and their efforts, related to metastatic cancer. 10. Flu vaccine 03-03-15 11.Advance directives in place: daughter and son HCPOA, limited interventions including hospitalization but not including intubation, mechanical ventilation, intensive care unit. They have met with palliative care liason previously.  12.GERD symptoms some better on protonix, continue 13.oral thrush: diflucan 14.daughter and patient likely would benefit from grief counseling if they would agree. Message to [facility] chaplain, as she may be able to offer support again at least when they are at office next.   All questions answered. Time spent 30 min including >50%  counseling and coordination of care. Cc Dr Diona Fanti  Gordy Levan, MD   05/22/2015, 4:18 PM

## 2015-05-22 NOTE — Telephone Encounter (Signed)
Appointments made and avs printed for patient °

## 2015-05-22 NOTE — Telephone Encounter (Signed)
Received message that patient wanted information about protein bars without nuts. Contacted patient by phone and gave patient a list of nut free protein bars. Patient denies nut allergy but states she does not tolerate nuts secondary to her ileostomy. Patient denies other needs at this time.

## 2015-05-22 NOTE — Telephone Encounter (Signed)
Appointments made and as printed for patient °

## 2015-05-27 ENCOUNTER — Telehealth: Payer: Self-pay

## 2015-05-27 ENCOUNTER — Encounter: Payer: Self-pay | Admitting: General Practice

## 2015-05-27 ENCOUNTER — Ambulatory Visit (HOSPITAL_BASED_OUTPATIENT_CLINIC_OR_DEPARTMENT_OTHER): Payer: BC Managed Care – PPO

## 2015-05-27 ENCOUNTER — Other Ambulatory Visit (HOSPITAL_BASED_OUTPATIENT_CLINIC_OR_DEPARTMENT_OTHER): Payer: BC Managed Care – PPO

## 2015-05-27 VITALS — BP 104/63 | HR 101 | Temp 99.5°F

## 2015-05-27 DIAGNOSIS — C801 Malignant (primary) neoplasm, unspecified: Secondary | ICD-10-CM

## 2015-05-27 DIAGNOSIS — Z95828 Presence of other vascular implants and grafts: Secondary | ICD-10-CM

## 2015-05-27 DIAGNOSIS — C569 Malignant neoplasm of unspecified ovary: Secondary | ICD-10-CM

## 2015-05-27 LAB — CBC WITH DIFFERENTIAL/PLATELET
BASO%: 0.9 % (ref 0.0–2.0)
Basophils Absolute: 0.1 10*3/uL (ref 0.0–0.1)
EOS%: 0.3 % (ref 0.0–7.0)
Eosinophils Absolute: 0 10*3/uL (ref 0.0–0.5)
HEMATOCRIT: 27.2 % — AB (ref 34.8–46.6)
HEMOGLOBIN: 8.8 g/dL — AB (ref 11.6–15.9)
LYMPH#: 0.5 10*3/uL — AB (ref 0.9–3.3)
LYMPH%: 4.1 % — ABNORMAL LOW (ref 14.0–49.7)
MCH: 27.4 pg (ref 25.1–34.0)
MCHC: 32.3 g/dL (ref 31.5–36.0)
MCV: 84.7 fL (ref 79.5–101.0)
MONO#: 0.8 10*3/uL (ref 0.1–0.9)
MONO%: 6.4 % (ref 0.0–14.0)
NEUT#: 11.3 10*3/uL — ABNORMAL HIGH (ref 1.5–6.5)
NEUT%: 88.3 % — AB (ref 38.4–76.8)
Platelets: 418 10*3/uL — ABNORMAL HIGH (ref 145–400)
RBC: 3.22 10*6/uL — ABNORMAL LOW (ref 3.70–5.45)
RDW: 17.9 % — AB (ref 11.2–14.5)
WBC: 12.8 10*3/uL — ABNORMAL HIGH (ref 3.9–10.3)

## 2015-05-27 MED ORDER — HEPARIN SOD (PORK) LOCK FLUSH 100 UNIT/ML IV SOLN
500.0000 [IU] | Freq: Once | INTRAVENOUS | Status: AC
  Administered 2015-05-27: 500 [IU] via INTRAVENOUS
  Filled 2015-05-27: qty 5

## 2015-05-27 MED ORDER — SODIUM CHLORIDE 0.9 % IJ SOLN
10.0000 mL | INTRAMUSCULAR | Status: DC | PRN
  Administered 2015-05-27: 10 mL via INTRAVENOUS
  Filled 2015-05-27: qty 10

## 2015-05-27 NOTE — Telephone Encounter (Signed)
S/w pt re hgb 8.8. She is mildly tired. She woke up with stomach upset this am and did not take her iron tablet. She was able to eat breakfast and lunch. She denies SOB, palpitations or chest pressure. Explained possibility of 1 unit of blood. She said she would call us if she feels any worse. She also asked that we call her with the UC and iron study results.

## 2015-05-27 NOTE — Telephone Encounter (Signed)
lvm that if pt is still taking antibiotics we will need to reschedule today's lab appt for 2-3 days after she completes her antibiotics. Because we are doing a urine culture and it would skew results.

## 2015-05-27 NOTE — Patient Instructions (Signed)

## 2015-05-27 NOTE — Telephone Encounter (Signed)
-----   Message from Gordy Levan, MD sent at 05/22/2015  4:09 PM EST ----- For urine culture 1-3 as long as off antibiotics then - if not yet off antibiotics, need to resched the urine culture and other labs ~ 2-3 days after completes antibiotics.  Other labs at time of urine culture will be CBC and iron studies  RN please follow up results of urine culture until final. If >= 100,000 of any single organism, will need additional treatment, ask APP or MD if questions.  Thank you!

## 2015-05-27 NOTE — Progress Notes (Signed)
Spiritual Care Note  Followed up with Baker Janus by phone to continue building rapport, particularly mindful that, per Dr Marko Plume, pt appears not to be acknowledging the terminal nature of her illness.  [place] was very friendly and welcoming of calls (today and future), using opportunity to share about recent activities, family relationships, and how her faith is helpful to her.  She named that she she feels well physically and is able to be relatively active (doctor appointments, errands, laundry), verbalizing that this differs from what her "doctors say about how I'm doing--but I'm not ready to give up; every day that God lets me get out of bed and put my feet on the floor, I'm going to get up and live for Him.  He'll let me know if it's time for something different."  Using her language--talking with Baker Janus about what God is telling her about her life/priorities, and how she's talking with God about her hopes/goals/feelings--may be an entree to deeper conversation about prognosis, fear/grief, GOC, and EOL concerns.  I will continue to reach out to build trust and to encourage deeper levels of sharing/reflection, with an eye toward grief support/counseling to help her process her situation and to help prepare her family.  (Caring for family, especially her daughter Estill Bamberg, who lives with her, may be another way to frame conversations about planning for the future.)  Please also page as needs arise or circumstances change.  Thank you.  [place], [place], [facility] Pager [phone] Voicemail  [phone]

## 2015-05-28 ENCOUNTER — Telehealth: Payer: Self-pay

## 2015-05-28 ENCOUNTER — Ambulatory Visit (HOSPITAL_COMMUNITY): Payer: BC Managed Care – PPO

## 2015-05-28 ENCOUNTER — Other Ambulatory Visit: Payer: Self-pay

## 2015-05-28 ENCOUNTER — Ambulatory Visit (HOSPITAL_COMMUNITY)
Admission: RE | Admit: 2015-05-28 | Discharge: 2015-05-28 | Disposition: A | Discharge status: Home / Self Care | Payer: BC Managed Care – PPO | Source: Ambulatory Visit | Attending: Oncology | Admitting: Oncology

## 2015-05-28 ENCOUNTER — Other Ambulatory Visit: Payer: Self-pay | Admitting: Oncology

## 2015-05-28 VITALS — BP 101/48 | HR 81 | Temp 98.7°F | Resp 16

## 2015-05-28 DIAGNOSIS — C569 Malignant neoplasm of unspecified ovary: Secondary | ICD-10-CM | POA: Diagnosis not present

## 2015-05-28 LAB — FERRITIN

## 2015-05-28 LAB — IRON AND TIBC
%SAT: 11 % — AB (ref 21–57)
Iron: 15 ug/dL — ABNORMAL LOW (ref 41–142)
TIBC: 133 ug/dL — ABNORMAL LOW (ref 236–444)
UIBC: 118 ug/dL — AB (ref 120–384)

## 2015-05-28 LAB — PREPARE RBC (CROSSMATCH)

## 2015-05-28 MED ORDER — SODIUM CHLORIDE 0.9 % IJ SOLN
10.0000 mL | INTRAMUSCULAR | Status: AC | PRN
  Administered 2015-05-28: 10 mL

## 2015-05-28 MED ORDER — SODIUM CHLORIDE 0.9 % IV SOLN
250.0000 mL | Freq: Once | INTRAVENOUS | Status: AC
  Administered 2015-05-28: 250 mL via INTRAVENOUS

## 2015-05-28 MED ORDER — ACETAMINOPHEN 325 MG PO TABS
325.0000 mg | ORAL_TABLET | Freq: Once | ORAL | Status: AC
  Administered 2015-05-28: 325 mg via ORAL
  Filled 2015-05-28: qty 1

## 2015-05-28 MED ORDER — HEPARIN SOD (PORK) LOCK FLUSH 100 UNIT/ML IV SOLN
500.0000 [IU] | Freq: Every day | INTRAVENOUS | Status: AC | PRN
  Administered 2015-05-28: 500 [IU]
  Filled 2015-05-28: qty 5

## 2015-05-28 NOTE — Telephone Encounter (Signed)
-----   Message from Gordy Levan, MD sent at 05/28/2015  1:02 PM EST ----- Labs seen and need follow up: please let her know iron is low. Hopefully the PRBCs 1-4 will help, and she should try to take oral iron daily as instructed between now and when I see her ~ 1-23.   (Ferritin not low, likely reflecting rest of illness instead of iron stores here)

## 2015-05-28 NOTE — Telephone Encounter (Signed)
lvm for pt to take an OTC iron tablet daily until her next OV on 1/23. Per Dr Edwyna Shell note attached.

## 2015-05-28 NOTE — Progress Notes (Signed)
MD: Marko Plume, Carlean Jews. Diagnosis Association: Malignant neoplasm of ovary, unspecified laterality (HCC) (183.0) Pt to day hospital for one unit PRBCs. Transfused via implanted port without difficulty. Flushed port per order. Post-procedure, pt A/O and ambulatory. Discharged to home.   Blair Hailey, RN

## 2015-05-28 NOTE — Telephone Encounter (Signed)
Kirsten Schroeder called back today stating she would like to receive a unit of blood. Her hgb 1-3 was 8.8 and Dr Edwyna Shell stated she could get 1 unit of blood if she is symptomatic. She remains more tired and has a headache. Arranged for pt to get blood at [facility] today at 1 pm.

## 2015-05-28 NOTE — Telephone Encounter (Signed)
-----   Message from Gordy Levan, MD sent at 05/22/2015  4:09 PM EST ----- For urine culture 1-3 as long as off antibiotics then - if not yet off antibiotics, need to resched the urine culture and other labs ~ 2-3 days after completes antibiotics.  Other labs at time of urine culture will be CBC and iron studies  RN please follow up results of urine culture until final. If >= 100,000 of any single organism, will need additional treatment, ask APP or MD if questions.  Thank you!

## 2015-05-28 NOTE — Telephone Encounter (Signed)
Received return call today from pt at 814 am stating that she would like to come in for blood transfusion today since she had someone that could bring her.  Message to Dr Mariana Kaufman nurse, Juliann Pulse.

## 2015-05-29 LAB — TYPE AND SCREEN
ABO/RH(D): O POS
Antibody Screen: POSITIVE
DAT, IGG: NEGATIVE
Unit division: 0

## 2015-05-29 NOTE — Telephone Encounter (Signed)
S/w pt. She is feeling stronger today, and her headache is gone after the blood transfusion yesterday. Clarified her iron intake per Dr Edwyna Shell attached note.

## 2015-05-29 NOTE — Telephone Encounter (Signed)
Continue present iron. (I was not sure what she was really taking now) thanks

## 2015-05-29 NOTE — Telephone Encounter (Signed)
Patient received message to take iron daily.  Called for clarification.  "Am I to add a third source of iron and at what dose?  I currently take a MVI with iron and the prescribed Fe Fum/Ferrocite Plus.  I will continue what I'm doing because she told me to continue the Fe Fum after the one time I threw up an hour after taking it.  "  Return number [phone].

## 2015-05-29 NOTE — Telephone Encounter (Signed)
Dr Edwyna Shell, please see documentation from [facility] and let me know what to do.

## 2015-05-31 LAB — URINE CULTURE

## 2015-06-01 ENCOUNTER — Inpatient Hospital Stay (HOSPITAL_COMMUNITY)
Admission: EM | Admit: 2015-06-01 | Discharge: 2015-06-03 | DRG: 872 | Disposition: A | Discharge status: Home / Self Care | Payer: BC Managed Care – PPO | Attending: Internal Medicine | Admitting: Internal Medicine

## 2015-06-01 ENCOUNTER — Encounter (HOSPITAL_COMMUNITY): Payer: Self-pay | Admitting: *Deleted

## 2015-06-01 DIAGNOSIS — C569 Malignant neoplasm of unspecified ovary: Secondary | ICD-10-CM | POA: Diagnosis present

## 2015-06-01 DIAGNOSIS — I10 Essential (primary) hypertension: Secondary | ICD-10-CM | POA: Diagnosis present

## 2015-06-01 DIAGNOSIS — C7951 Secondary malignant neoplasm of bone: Secondary | ICD-10-CM | POA: Diagnosis present

## 2015-06-01 DIAGNOSIS — A419 Sepsis, unspecified organism: Secondary | ICD-10-CM | POA: Diagnosis present

## 2015-06-01 DIAGNOSIS — Z79899 Other long term (current) drug therapy: Secondary | ICD-10-CM

## 2015-06-01 DIAGNOSIS — D6481 Anemia due to antineoplastic chemotherapy: Secondary | ICD-10-CM | POA: Diagnosis present

## 2015-06-01 DIAGNOSIS — Z66 Do not resuscitate: Secondary | ICD-10-CM | POA: Diagnosis present

## 2015-06-01 DIAGNOSIS — R530 Neoplastic (malignant) related fatigue: Secondary | ICD-10-CM | POA: Diagnosis present

## 2015-06-01 DIAGNOSIS — Z932 Ileostomy status: Secondary | ICD-10-CM

## 2015-06-01 DIAGNOSIS — B952 Enterococcus as the cause of diseases classified elsewhere: Secondary | ICD-10-CM | POA: Diagnosis present

## 2015-06-01 DIAGNOSIS — D701 Agranulocytosis secondary to cancer chemotherapy: Secondary | ICD-10-CM | POA: Diagnosis present

## 2015-06-01 DIAGNOSIS — T451X5A Adverse effect of antineoplastic and immunosuppressive drugs, initial encounter: Secondary | ICD-10-CM | POA: Diagnosis present

## 2015-06-01 DIAGNOSIS — R300 Dysuria: Secondary | ICD-10-CM | POA: Diagnosis not present

## 2015-06-01 DIAGNOSIS — N390 Urinary tract infection, site not specified: Secondary | ICD-10-CM | POA: Diagnosis present

## 2015-06-01 DIAGNOSIS — E860 Dehydration: Secondary | ICD-10-CM

## 2015-06-01 DIAGNOSIS — R112 Nausea with vomiting, unspecified: Secondary | ICD-10-CM | POA: Diagnosis not present

## 2015-06-01 LAB — COMPREHENSIVE METABOLIC PANEL
ALK PHOS: 148 U/L — AB (ref 38–126)
ALT: 14 U/L (ref 14–54)
ANION GAP: 11 (ref 5–15)
AST: 22 U/L (ref 15–41)
Albumin: 2.7 g/dL — ABNORMAL LOW (ref 3.5–5.0)
BILIRUBIN TOTAL: 0.8 mg/dL (ref 0.3–1.2)
BUN: 9 mg/dL (ref 6–20)
CALCIUM: 8.6 mg/dL — AB (ref 8.9–10.3)
CO2: 23 mmol/L (ref 22–32)
CREATININE: 1.07 mg/dL — AB (ref 0.44–1.00)
Chloride: 98 mmol/L — ABNORMAL LOW (ref 101–111)
GFR, EST NON AFRICAN AMERICAN: 56 mL/min — AB (ref >60)
Glucose, Bld: 109 mg/dL — ABNORMAL HIGH (ref 65–99)
Potassium: 4.1 mmol/L (ref 3.5–5.1)
SODIUM: 132 mmol/L — AB (ref 135–145)
TOTAL PROTEIN: 6.2 g/dL — AB (ref 6.5–8.1)

## 2015-06-01 LAB — URINE MICROSCOPIC-ADD ON: Squamous Epithelial / LPF: NONE SEEN

## 2015-06-01 LAB — LACTIC ACID, PLASMA: Lactic Acid, Venous: 1.1 mmol/L (ref 0.5–2.0)

## 2015-06-01 LAB — I-STAT CG4 LACTIC ACID, ED: Lactic Acid, Venous: 0.83 mmol/L (ref 0.5–2.0)

## 2015-06-01 LAB — CBC
HCT: 33.8 % — ABNORMAL LOW (ref 36.0–46.0)
HEMOGLOBIN: 10.9 g/dL — AB (ref 12.0–15.0)
MCH: 28 pg (ref 26.0–34.0)
MCHC: 32.2 g/dL (ref 30.0–36.0)
MCV: 86.9 fL (ref 78.0–100.0)
PLATELETS: 368 10*3/uL (ref 150–400)
RBC: 3.89 MIL/uL (ref 3.87–5.11)
RDW: 17.4 % — ABNORMAL HIGH (ref 11.5–15.5)
WBC: 18.4 10*3/uL — AB (ref 4.0–10.5)

## 2015-06-01 LAB — CREATININE, SERUM
Creatinine, Ser: 1.01 mg/dL — ABNORMAL HIGH (ref 0.44–1.00)
GFR, EST NON AFRICAN AMERICAN: 60 mL/min — AB (ref >60)

## 2015-06-01 LAB — URINALYSIS, ROUTINE W REFLEX MICROSCOPIC
Glucose, UA: NEGATIVE mg/dL
Ketones, ur: NEGATIVE mg/dL
NITRITE: POSITIVE — AB
Protein, ur: 100 mg/dL — AB
SPECIFIC GRAVITY, URINE: 1.018 (ref 1.005–1.030)
pH: 6 (ref 5.0–8.0)

## 2015-06-01 LAB — LIPASE, BLOOD: Lipase: 12 U/L (ref 11–51)

## 2015-06-01 LAB — PROCALCITONIN: PROCALCITONIN: 0.22 ng/mL

## 2015-06-01 LAB — APTT: aPTT: 34 seconds (ref 24–37)

## 2015-06-01 LAB — PROTIME-INR
INR: 1.26 (ref 0.00–1.49)
PROTHROMBIN TIME: 15.9 s — AB (ref 11.6–15.2)

## 2015-06-01 MED ORDER — MORPHINE SULFATE (PF) 2 MG/ML IV SOLN: 2.0000 mg | INTRAVENOUS | Status: DC | PRN

## 2015-06-01 MED ORDER — ACETAMINOPHEN 325 MG PO TABS
650.0000 mg | ORAL_TABLET | Freq: Four times a day (QID) | ORAL | Status: DC | PRN
  Administered 2015-06-02: 650 mg via ORAL
  Filled 2015-06-01: qty 2

## 2015-06-01 MED ORDER — VANCOMYCIN HCL IN DEXTROSE 1-5 GM/200ML-% IV SOLN
1000.0000 mg | Freq: Once | INTRAVENOUS | Status: AC
  Administered 2015-06-01: 1000 mg via INTRAVENOUS
  Filled 2015-06-01: qty 200

## 2015-06-01 MED ORDER — MORPHINE SULFATE ER 30 MG PO TBCR
30.0000 mg | EXTENDED_RELEASE_TABLET | Freq: Two times a day (BID) | ORAL | Status: DC
  Administered 2015-06-01: 30 mg via ORAL
  Filled 2015-06-01: qty 1

## 2015-06-01 MED ORDER — LORAZEPAM 2 MG/ML IJ SOLN
0.5000 mg | Freq: Once | INTRAMUSCULAR | Status: AC
  Administered 2015-06-01: 0.5 mg via INTRAVENOUS
  Filled 2015-06-01: qty 1

## 2015-06-01 MED ORDER — LORAZEPAM 0.5 MG PO TABS: 0.5000 mg | ORAL_TABLET | Freq: Four times a day (QID) | ORAL | Status: DC | PRN

## 2015-06-01 MED ORDER — ONDANSETRON HCL 4 MG/2ML IJ SOLN: 4.0000 mg | Freq: Four times a day (QID) | INTRAMUSCULAR | Status: DC | PRN

## 2015-06-01 MED ORDER — LIP MEDEX EX OINT
TOPICAL_OINTMENT | CUTANEOUS | Status: AC
  Filled 2015-06-01: qty 7

## 2015-06-01 MED ORDER — FERROUS FUMARATE 325 (106 FE) MG PO TABS
1.0000 | ORAL_TABLET | Freq: Every day | ORAL | Status: DC
  Administered 2015-06-02 – 2015-06-03 (×2): 106 mg via ORAL
  Filled 2015-06-01 (×3): qty 1

## 2015-06-01 MED ORDER — FERROCITE PLUS 106-1 MG PO TABS: 1.0000 | ORAL_TABLET | Freq: Every day | ORAL | Status: DC

## 2015-06-01 MED ORDER — ONDANSETRON HCL 4 MG/2ML IJ SOLN
4.0000 mg | Freq: Once | INTRAMUSCULAR | Status: AC
  Administered 2015-06-01: 4 mg via INTRAVENOUS
  Filled 2015-06-01: qty 2

## 2015-06-01 MED ORDER — VANCOMYCIN HCL 500 MG IV SOLR
500.0000 mg | Freq: Two times a day (BID) | INTRAVENOUS | Status: DC
  Administered 2015-06-02 (×2): 500 mg via INTRAVENOUS
  Filled 2015-06-01 (×2): qty 500

## 2015-06-01 MED ORDER — LIDOCAINE-PRILOCAINE 2.5-2.5 % EX CREA
TOPICAL_CREAM | Freq: Once | CUTANEOUS | Status: AC
  Administered 2015-06-01: 1 via TOPICAL
  Filled 2015-06-01: qty 5

## 2015-06-01 MED ORDER — OXYBUTYNIN CHLORIDE 5 MG PO TABS
5.0000 mg | ORAL_TABLET | Freq: Three times a day (TID) | ORAL | Status: DC
  Administered 2015-06-01 – 2015-06-03 (×5): 5 mg via ORAL
  Filled 2015-06-01 (×5): qty 1

## 2015-06-01 MED ORDER — PANTOPRAZOLE SODIUM 40 MG PO TBEC
40.0000 mg | DELAYED_RELEASE_TABLET | Freq: Every day | ORAL | Status: DC
  Administered 2015-06-02 – 2015-06-03 (×2): 40 mg via ORAL
  Filled 2015-06-01 (×2): qty 1

## 2015-06-01 MED ORDER — MORPHINE SULFATE ER 15 MG PO TBCR
15.0000 mg | EXTENDED_RELEASE_TABLET | Freq: Two times a day (BID) | ORAL | Status: DC
  Administered 2015-06-01: 15 mg via ORAL
  Filled 2015-06-01: qty 1

## 2015-06-01 MED ORDER — SODIUM CHLORIDE 0.9 % IV SOLN
INTRAVENOUS | Status: DC
  Administered 2015-06-01 – 2015-06-02 (×3): via INTRAVENOUS

## 2015-06-01 MED ORDER — SODIUM CHLORIDE 0.9 % IV BOLUS (SEPSIS)
2000.0000 mL | Freq: Once | INTRAVENOUS | Status: AC
  Administered 2015-06-01: 2000 mL via INTRAVENOUS

## 2015-06-01 MED ORDER — MIRABEGRON ER 50 MG PO TB24
50.0000 mg | ORAL_TABLET | Freq: Every day | ORAL | Status: DC
  Administered 2015-06-02 – 2015-06-03 (×2): 50 mg via ORAL
  Filled 2015-06-01 (×2): qty 1

## 2015-06-01 MED ORDER — MORPHINE SULFATE 15 MG PO TABS
15.0000 mg | ORAL_TABLET | ORAL | Status: DC | PRN
  Administered 2015-06-02: 30 mg via ORAL
  Filled 2015-06-01: qty 2

## 2015-06-01 MED ORDER — ONDANSETRON HCL 4 MG PO TABS: 4.0000 mg | ORAL_TABLET | Freq: Four times a day (QID) | ORAL | Status: DC | PRN

## 2015-06-01 MED ORDER — HEPARIN SODIUM (PORCINE) 5000 UNIT/ML IJ SOLN
5000.0000 [IU] | Freq: Three times a day (TID) | INTRAMUSCULAR | Status: DC
  Administered 2015-06-01 – 2015-06-03 (×5): 5000 [IU] via SUBCUTANEOUS
  Filled 2015-06-01 (×6): qty 1

## 2015-06-01 MED ORDER — ACETAMINOPHEN 650 MG RE SUPP: 650.0000 mg | Freq: Four times a day (QID) | RECTAL | Status: DC | PRN

## 2015-06-01 NOTE — H&P (Addendum)
[facility] History and Physical  Kirsten Schroeder [id number] DOB: 1956-02-18 DOA: 06/01/2015  Referring physician: Emergency Department PCP: [name], DO  Specialists: Dr. Marko Plume  Chief Complaint: N/v  HPI: Kirsten Schroeder is a 60 y.o. female history of ovarian cancer followed by Dr. Marko Plume and currently going undergoing chemotherapy, hypertension, history of hydronephrosis secondary to right-sided stent placement who presents to [facility] with complaints of intractable nausea and vomiting over the past 24 hours prior to hospital admission. In addition, patient did also report feeling markedly weak. In the emergency department, patient was noted have a urinalysis with large blood, large leukocytes, positive nitrites, and too numerous to count WBCs. Patient was also found to have a presenting white blood cell count of 18.4 thousand and heart rate up to just over 100. Patient was started on empiric vancomycin. Given presenting UTI with sepsis and generalized weakness, hospital service was consult for consideration for admission.  Review of Systems:  Review of Systems  Constitutional: Positive for weight loss and malaise/fatigue.  HENT: Negative for hearing loss and tinnitus.   Eyes: Negative for pain and discharge.  Respiratory: Negative for sputum production and shortness of breath.   Cardiovascular: Negative for palpitations and orthopnea.  Gastrointestinal: Positive for nausea and vomiting.  Genitourinary: Negative for urgency and flank pain.  Musculoskeletal: Positive for falls. Negative for back pain and neck pain.  Neurological: Positive for weakness. Negative for tingling, tremors and loss of consciousness.  Psychiatric/Behavioral: Negative for hallucinations, memory loss and substance abuse.     Past Medical History  Diagnosis Date  . Ulcerative colitis ([place])   . Hydronephrosis, right   . Ejection fraction < 50%     EF 56%  AND NORMAL WALL MOTION --  PER  MUGA   09-06-2013  . 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 Wears glasses   . Hydronephrosis, right   . Chemotherapy induced thrombocytopenia   . Peripheral neuropathy due to chemotherapy ([place])   . Hypertension     PT CURRENT NOT TAKING BP MEDICATION PER PCP SINCE BP IS LOW SECONDARY TO CHEMO  . Ovarian cancer ([place]) AUG 2014   SEROUS GYN CARCINOMA---  ONCOLOGIST--  DR Marko Plume    CURRENT CHEMOTHERAPY AND NEEDING MULTIPLE BLOOD TRANSFUSIONS  . Bone metastasis (HCC)     MILD ---  SECONDARY OVARIAN CANCER  . 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 Radiation 01/15/15-02/19/15    pelvic mass 45 gray   Past Surgical History  Procedure Laterality Date  . Cystoscopy w/ ureteral stent placement Right 01/12/2013    Procedure: CYSTOSCOPY WITH RETROGRADE PYELOGRAM/ RIGHT DOUBLE J STENT PLACEMENT;  Surgeon: Franchot Gallo, MD;  Location: [facility];  Service: Urology;  Laterality: Right;  . Cesarean section    . Cholecystectomy  1992  . Total colectomy w/ permanent ileostomy  1990  (2 PART SURGERY)    SEVERE UC  . Cystoscopy w/ ureteral stent placement Right 09/13/2013    Procedure: CYSTOSCOPY WITH STENT REPLACEMENT;  Surgeon: Franchot Gallo, MD;  Location: [facility];  Service: Urology;  Laterality: Right;  . Cystoscopy w/ ureteral stent placement Right 07/22/2014    Procedure: CYSTOSCOPY WITH STENT REPLACEMENT;  Surgeon: Jorja Loa, MD;  Location: [facility];  Service: Urology;  Laterality: Right;   Social History:  reports that she has never smoked. She has never used smokeless tobacco. She reports that she does not drink alcohol or use illicit drugs.  where does patient  live--home, ALF, SNF? and with whom if at home?  Can patient participate in ADLs?  Allergies  Allergen Reactions  . Penicillins Rash    As a child. Has patient had a PCN reaction causing immediate rash, facial/tongue/throat swelling, SOB or lightheadedness with hypotension: unknown Has  patient had a PCN reaction causing severe rash involving mucus membranes or skin necrosis: unknown Has patient had a PCN reaction that required hospitalization: unknown Has patient had a PCN reaction occurring within the last 10 years: unknown If all of the above answers are "NO", then may proceed with Cephalosporin use.     Family History  Problem Relation Age of Onset  . Hypertension Mother   . Arthritis Mother   . Stroke Mother   . Hypertension Father   . Diabetes Father   . Lung cancer Father   . Hypertension Brother   . Diabetes Brother   . Skin cancer Sister   . Prostate cancer Paternal Grandfather   . Leukemia Maternal Aunt   . Stomach cancer Maternal Grandfather      Prior to Admission medications   Medication Sig Start Date End Date Taking? Authorizing Provider  acyclovir (ZOVIRAX) 200 MG capsule Take 1 capsule (200 mg total) by mouth 3 (three) times daily as needed. For fever blister 10/03/13  Yes Lennis Marion Downer, MD  Calcium Carbonate-Vitamin D (CALCIUM + D PO) Take 1 tablet by mouth daily.    Yes Historical Provider, MD  Fe Fum-FA-B Cmp-C-Zn-Mg-Mn-Cu (FERROCITE PLUS) 106-1 MG TABS Take 1 tablet by mouth daily. 04/25/14  Yes Lennis Marion Downer, MD  lidocaine-prilocaine (EMLA) cream Apply 1 application topically as needed.   Yes Historical Provider, MD  LORazepam (ATIVAN) 0.5 MG tablet Take 1 tablet (0.5 mg total) by mouth every 6 (six) hours as needed (for nausea). 08/17/13  Yes Lennis Marion Downer, MD  megestrol (MEGACE) 40 MG tablet Take 1 tablet (40 mg total) by mouth daily. Hormone pill for appetite 04/04/15  Yes Lennis Marion Downer, MD  mirabegron ER (MYRBETRIQ) 50 MG TB24 tablet Take 50 mg by mouth daily.   Yes Historical Provider, MD  morphine (MS CONTIN) 15 MG 12 hr tablet Take 1 tablet (15 mg total) by mouth every 12 (twelve) hours. To equal total 45 mg dose of MS Contin 04/15/15  Yes Lennis P [name], MD  morphine (MS CONTIN) 30 MG 12 hr tablet Take 1 tablet (30 mg total)  by mouth every 12 (twelve) hours. Patient taking differently: Take 30 mg by mouth every 12 (twelve) hours. To equal total 45 mg dose of MS Contin 05/12/15  Yes Lennis P [name], MD  morphine (MSIR) 15 MG tablet Take 1-2 tablets (15-30 mg total) by mouth every 4 (four) hours as needed for severe pain. 05/01/15  Yes Lennis Marion Downer, MD  Multiple Vitamin (MULTIVITAMIN WITH MINERALS) TABS tablet Take 1 tablet by mouth daily.   Yes Historical Provider, MD  ondansetron (ZOFRAN) 8 MG tablet Take 1-2 tablets (8-16 mg total) by mouth every 12 (twelve) hours as needed for nausea. 04/23/15  Yes Lennis P [name], MD  oxybutynin (DITROPAN) 5 MG tablet TAKE 1 TABLET(5 MG) BY MOUTH THREE TIMES DAILY 05/07/15  Yes Lennis P [name], MD  pantoprazole (PROTONIX) 40 MG tablet Take 1 tablet (40 mg total) by mouth daily. 05/12/15  Yes Lennis Marion Downer, MD  Phenazopyridine HCl (AZO TABS PO) Take 1 tablet by mouth 3 (three) times daily.   Yes Historical Provider, MD  sodium chloride (OCEAN) 0.65 %  nasal spray Place 1 spray into the nose as needed. Use every 1-3 hours while awake to keep nose moist, which helps to prevent nosebleeds   Yes Historical Provider, MD  fluconazole (DIFLUCAN) 100 MG tablet Take 1 tablet (100 mg total) by mouth daily. For thrush Patient not taking: Reported on 06/01/2015 05/22/15   Gordy Levan, MD  nitrofurantoin, macrocrystal-monohydrate, (MACROBID) 100 MG capsule Take 1 capsule (100 mg total) by mouth 2 (two) times daily. For UTI Patient not taking: Reported on 06/01/2015 05/16/15   Gordy Levan, MD  olaparib (LYNPARZA) 50 MG capsule Take 8 capsules (400 mg total) by mouth 2 (two) times daily. Swallow whole. 04/23/15   Lennis Marion Downer, MD   Physical Exam: Filed Vitals:   06/01/15 1427 06/01/15 1458 06/01/15 1535 06/01/15 1708  BP: 127/80 147/72 129/77 127/58  Pulse: 109 100 99 110  Temp: 98.6 F (37 C) 98.3 F (36.8 C) 98.4 F (36.9 C) 98.4 F (36.9 C)  TempSrc: Oral Oral Oral Oral   Resp: 20 20 20 14   SpO2: 98% 97% 98% 97%     General:  Awake, in nad  Eyes: PERRLB  ENT: membranes moist, dentition fair  Neck: trachea midline, neck supple  Cardiovascular: regular, s1, s2  Respiratory: normal resp effort, no wheezing  Abdomen: soft,nondistended  Skin: normal skin turgor, no abnormal skin lesions seen  Musculoskeletal: perfused,no clubbing  Psychiatric: mood/affect normal// no auditory/visual hallucinations  Neurologic: cn2-12 grossly intact, strength/sensation intact  Labs on Admission:  Basic Metabolic Panel:  Recent Labs Lab 06/01/15 1431  NA 132*  K 4.1  CL 98*  CO2 23  GLUCOSE 109*  BUN 9  CREATININE 1.07*  CALCIUM 8.6*   Liver Function Tests:  Recent Labs Lab 06/01/15 1431  AST 22  ALT 14  ALKPHOS 148*  BILITOT 0.8  PROT 6.2*  ALBUMIN 2.7*    Recent Labs Lab 06/01/15 1431  LIPASE 12   No results for input(s): AMMONIA in the last 168 hours. CBC:  Recent Labs Lab 05/27/15 1405 06/01/15 1431  WBC 12.8* 18.4*  NEUTROABS 11.3*  --   HGB 8.8* 10.9*  HCT 27.2* 33.8*  MCV 84.7 86.9  PLT 418* 368   Cardiac Enzymes: No results for input(s): CKTOTAL, CKMB, CKMBINDEX, TROPONINI in the last 168 hours.  BNP (last 3 results) No results for input(s): BNP in the last 8760 hours.  ProBNP (last 3 results) No results for input(s): PROBNP in the last 8760 hours.  CBG: No results for input(s): GLUCAP in the last 168 hours.  Radiological Exams on Admission: No results found.   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 Sepsis with  UTI 1. History of Enterococcus UTI, multidrug sensitive 2. Pt with leukocytosis and tachycardia to the 100's 3. Lactate normal 4. Was on macrobid as outpt 5. Given presentation, will empirically start on vanc, plans to de-escalate abx when improved 6. Admit to med-surg 2. Intractable nausea/vomiting 1. Secondary to presenting UTI 2. Continue anti-emetics as tolerated 3. HTN 1. Blood pressure currently stable and controlled 2. Continue to monitor 4. Ovarian cancer 1. Will flag Dr. Marko Plume of patient's admission  2. S/p chemo 2 weeks prior 5. Weakness 1. Patient reportedly fell [place] secondary to generalized weakness 6. DVT prophylaxis 1. Heparin subcutaneous while inpatient  Code Status: Family referred me to Dr.[name]'s documentation. Documentation reviewed. Patient was seen by palliative care on 11/16 and DNR/DNI status confirmed at that time.  Family Communication: Pt in room Disposition Plan: admit to [place], [facility] Pager [phone]  If 7PM-7AM, please contact night-coverage [email or web address] Password [email or web address] 06/01/2015, 5:51 PM

## 2015-06-01 NOTE — ED Provider Notes (Signed)
CSN: [id number]     Arrival date & time 06/01/15  1403 History   First MD Initiated Contact with Patient 06/01/15 1505     Chief Complaint  Patient presents with  . ca patient   . Emesis  . Weakness     (Consider location/radiation/quality/duration/timing/severity/associated sxs/prior Treatment) HPI Comments: Pt w/ h/o ovarian CA and last chemotx 2 weeks ago Pt tx for uti this week Denies flank pain  Patient is a 60 y.o. female presenting with vomiting and weakness. The history is provided by the patient and a relative.  Emesis Severity:  Severe Duration:  1 day Timing:  Constant Quality:  Unable to specify Progression:  Worsening Chronicity:  New Recent urination:  Increased Context: not post-tussive and not self-induced   Relieved by:  Nothing Worsened by:  Nothing tried Ineffective treatments:  Antiemetics Associated symptoms: no abdominal pain, no cough, no diarrhea and no fever   Weakness Pertinent negatives include no abdominal pain.    Past Medical History  Diagnosis Date  . Ulcerative colitis ([place])   . Hydronephrosis, right   . Ejection fraction < 50%     EF 56%  AND NORMAL WALL MOTION --  PER  [facility]GA  09-06-2013  . 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 Wears glasses   . Hydronephrosis, right   . Chemotherapy induced thrombocytopenia   . Peripheral neuropathy due to chemotherapy ([place])   . Hypertension     PT CURRENT NOT TAKING BP MEDICATION PER PCP SINCE BP IS LOW SECONDARY TO CHEMO  . Ovarian cancer ([place]) AUG 2014   SEROUS GYN CARCINOMA---  ONCOLOGIST--  DR Marko Plume    CURRENT CHEMOTHERAPY AND NEEDING MULTIPLE BLOOD TRANSFUSIONS  . Bone metastasis (HCC)     MILD ---  SECONDARY OVARIAN CANCER  . 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 Radiation 01/15/15-02/19/15    pelvic mass 45 gray   Past Surgical History  Procedure Laterality Date  . Cystoscopy w/ ureteral stent placement Right 01/12/2013    Procedure: CYSTOSCOPY WITH RETROGRADE PYELOGRAM/ RIGHT  DOUBLE J STENT PLACEMENT;  Surgeon: Franchot Gallo, MD;  Location: [facility];  Service: Urology;  Laterality: Right;  . Cesarean section    . Cholecystectomy  1992  . Total colectomy w/ permanent ileostomy  1990  (2 PART SURGERY)    SEVERE UC  . Cystoscopy w/ ureteral stent placement Right 09/13/2013    Procedure: CYSTOSCOPY WITH STENT REPLACEMENT;  Surgeon: Franchot Gallo, MD;  Location[facility];  Service: Urology;  Laterality: Right;  . Cystoscopy w/ ureteral stent placement Right 07/22/2014    Procedure: CYSTOSCOPY WITH STENT REPLACEMENT;  Surgeon: Jorja Loa, MD;  Location: [facility];  Service: Urology;  Laterality: Right;   Family History  Problem Relation Age of Onset  . Hypertension Mother   . Arthritis Mother   . Stroke Mother   . Hypertension Father   . Diabetes Father   . Lung cancer Father   . Hypertension Brother   . Diabetes Brother   . Skin cancer Sister   . Prostate cancer Paternal Grandfather   . Leukemia Maternal Aunt   . Stomach cancer Maternal Grandfather    Social History  Substance Use Topics  . Smoking status: Never Smoker   . Smokeless tobacco: Never Used  . Alcohol Use: No   OB History    No data available     Review of Systems  Gastrointestinal: Positive for vomiting. Negative for abdominal pain  and diarrhea.  Neurological: Positive for weakness.  All other systems reviewed and are negative.     Allergies  Penicillins  Home Medications   Prior to Admission medications   Medication Sig Start Date End Date Taking? Authorizing Provider  acyclovir (ZOVIRAX) 200 MG capsule Take 1 capsule (200 mg total) by mouth 3 (three) times daily as needed. For fever blister 10/03/13  Yes Lennis Marion Downer, MD  megestrol (MEGACE) 40 MG tablet Take 1 tablet (40 mg total) by mouth daily. Hormone pill for appetite 04/04/15  Yes Lennis Marion Downer, MD  mirabegron ER (MYRBETRIQ) 50 MG TB24 tablet Take 50 mg by mouth daily.    Yes Historical Provider, MD  morphine (MS CONTIN) 15 MG 12 hr tablet Take 1 tablet (15 mg total) by mouth every 12 (twelve) hours. To equal total 45 mg dose of MS Contin 04/15/15  Yes Lennis P [name], MD  morphine (MS CONTIN) 30 MG 12 hr tablet Take 1 tablet (30 mg total) by mouth every 12 (twelve) hours. Patient taking differently: Take 30 mg by mouth every 12 (twelve) hours. To equal total 45 mg dose of MS Contin 05/12/15  Yes Lennis P [name], MD  morphine (MSIR) 15 MG tablet Take 1-2 tablets (15-30 mg total) by mouth every 4 (four) hours as needed for severe pain. 05/01/15  Yes Lennis Marion Downer, MD  nitrofurantoin, macrocrystal-monohydrate, (MACROBID) 100 MG capsule Take 1 capsule (100 mg total) by mouth 2 (two) times daily. For UTI 05/16/15  Yes Lennis Marion Downer, MD  ondansetron (ZOFRAN) 8 MG tablet Take 1-2 tablets (8-16 mg total) by mouth every 12 (twelve) hours as needed for nausea. 04/23/15  Yes Lennis P [name], MD  oxybutynin (DITROPAN) 5 MG tablet TAKE 1 TABLET(5 MG) BY MOUTH THREE TIMES DAILY 05/07/15  Yes Lennis P [name], MD  pantoprazole (PROTONIX) 40 MG tablet Take 1 tablet (40 mg total) by mouth daily. 05/12/15  Yes Lennis Marion Downer, MD  Calcium Carbonate-Vitamin D (CALCIUM + D PO) Take 1 tablet by mouth daily.     Historical Provider, MD  diazepam (VALIUM) 2 MG tablet Take 1 mg by mouth daily as needed for anxiety. Reported on 05/22/2015    Historical Provider, MD  Fe Fum-FA-B Cmp-C-Zn-Mg-Mn-Cu (FERROCITE PLUS) 106-1 MG TABS Take 1 tablet by mouth daily. 04/25/14   Lennis Marion Downer, MD  fluconazole (DIFLUCAN) 100 MG tablet Take 1 tablet (100 mg total) by mouth daily. For thrush 05/22/15   Lennis Marion Downer, MD  lidocaine (XYLOCAINE) 2 % solution as needed. Reported on 05/22/2015    Historical Provider, MD  lidocaine-prilocaine (EMLA) cream Apply 1 application topically as needed.    Historical Provider, MD  LORazepam (ATIVAN) 0.5 MG tablet Take 1 tablet (0.5 mg total) by mouth every  6 (six) hours as needed (for nausea). 08/17/13   Lennis Marion Downer, MD  Meth-Hyo-M Barnett Hatter Phos-Ph Sal (URIBEL) 118 MG CAPS Take 118 mg by mouth 4 (four) times daily -  before meals and at bedtime. 05/14/15   Historical Provider, MD  Multiple Vitamin (MULTIVITAMIN WITH MINERALS) TABS tablet Take 1 tablet by mouth daily.    Historical Provider, MD  olaparib (LYNPARZA) 50 MG capsule Take 8 capsules (400 mg total) by mouth 2 (two) times daily. Swallow whole. Patient not taking: Reported on 05/22/2015 04/23/15   Gordy Levan, MD  sodium chloride (OCEAN) 0.65 % nasal spray Place 1 spray into the nose as needed. Use every 1-3 hours while awake to keep nose  moist, which helps to prevent nosebleeds    Historical Provider, MD   BP 147/72 mmHg  Pulse 100  Temp(Src) 98.3 F (36.8 C) (Oral)  Resp 20  SpO2 97% Physical Exam  Constitutional: She is oriented to person, place, and time. She appears well-developed and well-nourished.  Non-toxic appearance. No distress.  HENT:  Head: Normocephalic and atraumatic.  Eyes: Conjunctivae, EOM and lids are normal. Pupils are equal, round, and reactive to light.  Neck: Normal range of motion. Neck supple. No tracheal deviation present. No thyroid mass present.  Cardiovascular: Normal rate, regular rhythm and normal heart sounds.  Exam reveals no gallop.   No murmur heard. Pulmonary/Chest: Effort normal and breath sounds normal. No stridor. No respiratory distress. She has no decreased breath sounds. She has no wheezes. She has no rhonchi. She has no rales.  Abdominal: Soft. Normal appearance and bowel sounds are normal. She exhibits no distension. There is no tenderness. There is no rigidity, no rebound, no guarding and no CVA tenderness.  Ostomy bag noted w/o blood  Musculoskeletal: Normal range of motion. She exhibits no edema or tenderness.  Neurological: She is alert and oriented to person, place, and time. She has normal strength. No cranial nerve deficit or  sensory deficit. GCS eye subscore is 4. GCS verbal subscore is 5. GCS motor subscore is 6.  Skin: Skin is warm and dry. No abrasion and no rash noted.  Psychiatric: She has a normal mood and affect. Her speech is normal and behavior is normal.  Nursing note and vitals reviewed.   ED Course  Procedures (including critical care time) Labs Review Labs Reviewed  CBC - Abnormal; Notable for the following:    WBC 18.4 (*)    Hemoglobin 10.9 (*)    HCT 33.8 (*)    RDW 17.4 (*)    All other components within normal limits  LIPASE, BLOOD  COMPREHENSIVE METABOLIC PANEL  URINALYSIS, ROUTINE W REFLEX MICROSCOPIC (NOT AT [facility])  I-STAT CG4 LACTIC ACID, ED    Imaging Review No results found. I have personally reviewed and evaluated these images and lab results as part of my medical decision-making.   EKG Interpretation None      MDM   Final diagnoses:  None    Patient to be treated with IV vancomycin for her UTI. She does have leukocytosis noted on her CBC. Blood and urine cultures obtained. Given IV fluids here did have a episode of being weak in the bathroom where she fell to the ground but doesn't have any obvious signs of trauma. Will admit to the hospital    Lacretia Leigh, MD 06/01/15 1750

## 2015-06-01 NOTE — Progress Notes (Signed)
ANTIBIOTIC CONSULT NOTE - INITIAL  Pharmacy Consult for Vancomycin  Indication:   Allergies  Allergen Reactions  . Penicillins Rash    As a child. Has patient had a PCN reaction causing immediate rash, facial/tongue/throat swelling, SOB or lightheadedness with hypotension: unknown Has patient had a PCN reaction causing severe rash involving mucus membranes or skin necrosis: unknown Has patient had a PCN reaction that required hospitalization: unknown Has patient had a PCN reaction occurring within the last 10 years: unknown If all of the above answers are "NO", then may proceed with Cephalosporin use.    Patient Measurements:   Total body weight: 50.7 kg  Vital Signs: Temp: 98.4 F (36.9 C) (01/08 1708) Temp Source: Oral (01/08 1708) BP: 127/58 mmHg (01/08 1708) Pulse Rate: 110 (01/08 1708) Intake/Output from previous day:   Intake/Output from this shift:    Labs:  Recent Labs  06/01/15 1431  WBC 18.4*  HGB 10.9*  PLT 368  CREATININE 1.07*   Estimated Creatinine Clearance: 43.3 mL/min (by C-G formula based on Cr of 1.07). No results for input(s): VANCOTROUGH, VANCOPEAK, VANCORANDOM, GENTTROUGH, GENTPEAK, GENTRANDOM, TOBRATROUGH, TOBRAPEAK, TOBRARND, AMIKACINPEAK, AMIKACINTROU, AMIKACIN in the last 72 hours.   Microbiology: Recent Results (from the past 720 hour(s))  Urine Culture     Status: None   Collection Time: 05/12/15  8:58 AM  Result Value Ref Range Status   Urine Culture, Routine Culture, Urine  Final    Comment: Final - ===== COLONY COUNT: ===== >=100,000 COLONIES/ML ENTEROCOCCUS SPECIES Yeast  ------------------------------------------------------------------------  ENTEROCOCCUS SPECIES     AMPICILLIN                       MIC      Sensitive        <=2 ug/ml    LEVOFLOXACIN                     MIC      Sensitive          2 ug/ml    NITROFURANTOIN                   MIC      Sensitive       <=16 ug/ml    VANCOMYCIN                       MIC       Sensitive          1 ug/ml    TETRACYCLINE                     MIC      Resistant       >=16 ug/ml END OF REPORT   Urine Culture     Status: None   Collection Time: 05/27/15  2:06 PM  Result Value Ref Range Status   Urine Culture, Routine Culture, Urine  Final    Comment: Final - ===== COLONY COUNT: ===== >=100,000 COLONIES/ML ENTEROCOCCUS SPECIES  ------------------------------------------------------------------------  ENTEROCOCCUS SPECIES     AMPICILLIN                       MIC      Sensitive        <=2 ug/ml    LEVOFLOXACIN                     MIC      Sensitive  2 ug/ml    NITROFURANTOIN                   MIC      Sensitive       <=16 ug/ml    VANCOMYCIN                       MIC      Sensitive          1 ug/ml    TETRACYCLINE                     MIC      Resistant       >=16 ug/ml END OF REPORT    Medical History: Past Medical History  Diagnosis Date  . Ulcerative colitis ([place])   . Hydronephrosis, right   . Ejection fraction < 50%     EF 56%  AND NORMAL WALL MOTION --  PER  [facility]  09-06-2013  . 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 Wears glasses   . Hydronephrosis, right   . Chemotherapy induced thrombocytopenia   . Peripheral neuropathy due to chemotherapy ([place])   . Hypertension     PT CURRENT NOT TAKING BP MEDICATION PER PCP SINCE BP IS LOW SECONDARY TO CHEMO  . Ovarian cancer ([place]) AUG 2014   SEROUS GYN CARCINOMA---  ONCOLOGIST--  DR Marko Plume    CURRENT CHEMOTHERAPY AND NEEDING MULTIPLE BLOOD TRANSFUSIONS  . Bone metastasis (H[facility])     MILD ---  SECONDARY OVARIAN CANCER  . 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 Radiation 01/15/15-02/19/15    pelvic mass 45 gray   Medications:  Scheduled:  . ferrous fumarate  1 tablet Oral Daily  . heparin  5,000 Units Subcutaneous 3 times per day  . [START ON 06/02/2015] mirabegron ER  50 mg Oral Daily  . morphine  15 mg Oral Q12H  . morphine  30 mg Oral Q12H  . oxybutynin  5 mg Oral TID  . pantoprazole  40 mg  Oral Daily  . vancomycin  1,000 mg Intravenous Once   Anti-infectives    Start     Dose/Rate Route Frequency Ordered Stop   06/01/15 1800  vancomycin (VANCOCIN) IVPB 1000 mg/200 mL premix     1,000 mg 200 mL/hr over 60 Minutes Intravenous  Once 06/01/15 1747       Assessment: 80 yoF with hx of hydronephrosis and R stent, to ED with intractable N/V, leukocytosis.  Recent Urine Cx with Enterococcus.  Begin Vancomycin for presumed enterococcal UTI.  Received Vanc 1gm x1   Goal of Therapy:  Vancomycin trough level 15-20 mcg/ml  Plan:   Vanc x1 given, follow with 500mg  q12  Follow renal function, culture results.  Nyoka Cowden, Tishawn Friedhoff L 06/01/2015,7:43 PM

## 2015-06-01 NOTE — ED Notes (Signed)
Patient stated that she needed to go back to the restroom. This tech instructed patient that she is too weak to walk and will need staff assistance. Patient agreeded and this tech used the Steady to transport patient to the restroom. This tech also stayed in the room with patient to assist. Patient was able to tolerate use of Steady to and from restroom.

## 2015-06-01 NOTE — ED Notes (Addendum)
Per EMS, pt complains of vomiting since yesterday. Pt states she vomited 20-25 times in the past 24 hours. Pt states she has been unable to eat since 5PM yesterday. Pt could not take zofran due to emesis. Pt has hx of ovarian cancer. Pt states she was being treated for a bladder/kidney infection for the past 3 months, states she sees "red dots" in her urine, which she states she also saw when first diagnosed with bladder/kidney infection. Pt last had had chemo 2 weeks ago.

## 2015-06-01 NOTE — ED Notes (Signed)
This tech heard a scream from restroom 1. When patients family opened the door to restroom 1 I observed patient laying on the restroom floor. Patient laying on right side. Patient stated that she fell and hit her head on the floor. I then asked for RN, Charge and EDP to come evaluate patient. Staff then assisted patient back onto stretcher. Patient was able to ambulate to stretcher.

## 2015-06-02 DIAGNOSIS — C569 Malignant neoplasm of unspecified ovary: Secondary | ICD-10-CM

## 2015-06-02 DIAGNOSIS — T451X5A Adverse effect of antineoplastic and immunosuppressive drugs, initial encounter: Secondary | ICD-10-CM

## 2015-06-02 DIAGNOSIS — D701 Agranulocytosis secondary to cancer chemotherapy: Secondary | ICD-10-CM

## 2015-06-02 LAB — COMPREHENSIVE METABOLIC PANEL
ALBUMIN: 2.3 g/dL — AB (ref 3.5–5.0)
ALT: 13 U/L — ABNORMAL LOW (ref 14–54)
AST: 20 U/L (ref 15–41)
Alkaline Phosphatase: 138 U/L — ABNORMAL HIGH (ref 38–126)
Anion gap: 8 (ref 5–15)
BILIRUBIN TOTAL: 1 mg/dL (ref 0.3–1.2)
BUN: 10 mg/dL (ref 6–20)
CHLORIDE: 104 mmol/L (ref 101–111)
CO2: 24 mmol/L (ref 22–32)
Calcium: 8.3 mg/dL — ABNORMAL LOW (ref 8.9–10.3)
Creatinine, Ser: 1.14 mg/dL — ABNORMAL HIGH (ref 0.44–1.00)
GFR calc Af Amer: 60 mL/min — ABNORMAL LOW (ref >60)
GFR calc non Af Amer: 52 mL/min — ABNORMAL LOW (ref >60)
GLUCOSE: 85 mg/dL (ref 65–99)
POTASSIUM: 3.8 mmol/L (ref 3.5–5.1)
Sodium: 136 mmol/L (ref 135–145)
TOTAL PROTEIN: 5.4 g/dL — AB (ref 6.5–8.1)

## 2015-06-02 LAB — CBC
HEMATOCRIT: 31.1 % — AB (ref 36.0–46.0)
Hemoglobin: 9.7 g/dL — ABNORMAL LOW (ref 12.0–15.0)
MCH: 28 pg (ref 26.0–34.0)
MCHC: 31.2 g/dL (ref 30.0–36.0)
MCV: 89.9 fL (ref 78.0–100.0)
Platelets: 354 10*3/uL (ref 150–400)
RBC: 3.46 MIL/uL — ABNORMAL LOW (ref 3.87–5.11)
RDW: 17.9 % — AB (ref 11.5–15.5)
WBC: 15.9 10*3/uL — ABNORMAL HIGH (ref 4.0–10.5)

## 2015-06-02 LAB — LACTIC ACID, PLASMA: Lactic Acid, Venous: 0.9 mmol/L (ref 0.5–2.0)

## 2015-06-02 MED ORDER — LEVOFLOXACIN 500 MG PO TABS
250.0000 mg | ORAL_TABLET | ORAL | Status: DC
  Administered 2015-06-02: 250 mg via ORAL
  Filled 2015-06-02: qty 1

## 2015-06-02 MED ORDER — MORPHINE SULFATE ER 30 MG PO TBCR
45.0000 mg | EXTENDED_RELEASE_TABLET | Freq: Two times a day (BID) | ORAL | Status: DC
  Administered 2015-06-02 – 2015-06-03 (×3): 45 mg via ORAL
  Filled 2015-06-02 (×3): qty 1

## 2015-06-02 NOTE — Progress Notes (Signed)
Patient arrived on the unit at approximately 1924. She is alert and verbally responsive and voiced no complaints. Ileostomy appliance in place has good liquid output. Had a fall in the ER bathroom as per reporting nurse who stated no injuries noted. Nurse stated patient refused to use the bedside commode while in the ED and insisted on ambulating to the bathroom resulting in patient falling. Fall prevention plan in place. Patient has being using the call bell appropriately since arriving on the unit for assistance to go to the bathroom. Will closely monitor.

## 2015-06-02 NOTE — Progress Notes (Signed)
[facility] PROGRESS NOTE  Kirsten Schroeder [id number] DOB: [birth year] DOA: 06/01/2015 PCP: [name], DO  HPI/Brief narrative 60 y.o. female history of ovarian cancer followed by Dr. Marko Plume and currently going undergoing chemotherapy, hypertension, history of hydronephrosis secondary to right-sided stent placement who presents to [place] with complaints of intractable nausea and vomiting over the past 24 hours prior to hospital admission. In addition, patient did also report feeling markedly weak. In the emergency department, patient was noted have a urinalysis with large blood, large leukocytes, positive nitrites, and too numerous to count WBCs. Patient was also found to have a presenting white blood cell count of 18.4 thousand and heart rate up to just over 100. Patient was started on empiric vancomycin. Given presenting UTI with sepsis and generalized weakness, hospitalist service was consult for consideration for admission  Assessment/Plan: 1. Sepsis with UTI 1. History of Enterococcus UTI, multidrug sensitive 2. Pt with presenting leukocytosis and tachycardia to the 100's 3. Lactate normal 4. Failed PO macrobid as outpt 5. Given presentation, empirically started vanc 6. Clinically much improved today. Consider transitioning to PO abx later in afternoon (after 24hrs of IV abx) if stable 2. Intractable nausea/vomiting 1. Secondary to presenting UTI 2. Continue anti-emetics as tolerated 3. Much improved 4. Advance diet as tolerated 3. HTN 1. Blood pressure currently stable and controlled 2. Continue to monitor 4. Ovarian cancer 1. Will flag Dr. Marko Plume of patient's admission 2. S/p chemo 2 weeks prior 5. Weakness 1. Patient reportedly fell in emergency department secondary to generalized weakness 2. Seen by PT, no f/u recommended 6. DVT prophylaxis 1. Heparin subcutaneous while inpatient  Code Status: DNR Family Communication: Pt in room Disposition Plan:  Possible d/c in 24hrs   Consultants:    Procedures:    Antibiotics: Anti-infectives    Start     Dose/Rate Route Frequency Ordered Stop   06/02/15 0600  vancomycin (VANCOCIN) 500 mg in sodium chloride 0.9 % 100 mL IVPB     500 mg 100 mL/hr over 60 Minutes Intravenous Every 12 hours 06/01/15 1953     06/01/15 1800  vancomycin (VANCOCIN) IVPB 1000 mg/200 mL premix     1,000 mg 200 mL/hr over 60 Minutes Intravenous  Once 06/01/15 1747 06/01/15 1958      HPI/Subjective: Feels much better today  Objective: Filed Vitals:   06/01/15 1942 06/01/15 2204 06/02/15 0602 06/02/15 1315  BP: 139/72 123/55 112/50 130/61  Pulse: 105 103 110 105  Temp: 98.5 F (36.9 C) 98.3 F (36.8 C) 98.4 F (36.9 C) 98.6 F (37 C)  TempSrc: Oral Oral Oral Oral  Resp: 16 18 18 16   Height: 5\' 1"  (1.549 m)     Weight: 50.7 kg (111 lb 12.4 oz)     SpO2: 98% 96% 95% 98%    Intake/Output Summary (Last 24 hours) at 06/02/15 1417 Last data filed at 06/02/15 1300  Gross per 24 hour  Intake 2288.8 ml  Output   2275 ml  Net   13.8 ml   Filed Weights   06/01/15 1942  Weight: 50.7 kg (111 lb 12.4 oz)    Exam:   General:  Awake, in nad  Cardiovascular: regular, s1, s2  Respiratory: normal resp effort, no wheezing  Abdomen: soft, nondistended  Musculoskeletal: perfused, no clubbing   Data Reviewed: Basic Metabolic Panel:  Recent Labs Lab 06/01/15 1431 06/01/15 1836 06/02/15 0440  NA 132*  --  136  K 4.1  --  3.8  CL 98*  --  104  CO2 23  --  24  GLUCOSE 109*  --  85  BUN 9  --  10  CREATININE 1.07* 1.01* 1.14*  CALCIUM 8.6*  --  8.3*   Liver Function Tests:  Recent Labs Lab 06/01/15 1431 06/02/15 0440  AST 22 20  ALT 14 13*  ALKPHOS 148* 138*  BILITOT 0.8 1.0  PROT 6.2* 5.4*  ALBUMIN 2.7* 2.3*    Recent Labs Lab 06/01/15 1431  LIPASE 12   No results for input(s): AMMONIA in the last 168 hours. CBC:  Recent Labs Lab 05/27/15 1405 06/01/15 1431  06/02/15 0440  WBC 12.8* 18.4* 15.9*  NEUTROABS 11.3*  --   --   HGB 8.8* 10.9* 9.7*  HCT 27.2* 33.8* 31.1*  MCV 84.7 86.9 89.9  PLT 418* 368 354   Cardiac Enzymes: No results for input(s): CKTOTAL, CKMB, CKMBINDEX, TROPONINI in the last 168 hours. BNP (last 3 results) No results for input(s): BNP in the last 8760 hours.  ProBNP (last 3 results) No results for input(s): PROBNP in the last 8760 hours.  CBG: No results for input(s): GLUCAP in the last 168 hours.  Recent Results (from the past 240 hour(s))  Urine Culture     Status: None   Collection Time: 05/27/15  2:06 PM  Result Value Ref Range Status   Urine Culture, Routine Culture, Urine  Final    Comment: Final - ===== COLONY COUNT: ===== >=100,000 COLONIES/ML ENTEROCOCCUS SPECIES  ------------------------------------------------------------------------  ENTEROCOCCUS SPECIES     AMPICILLIN                       MIC      Sensitive        <=2 ug/ml    LEVOFLOXACIN                     MIC      Sensitive          2 ug/ml    NITROFURANTOIN                   MIC      Sensitive       <=16 ug/ml    VANCOMYCIN                       MIC      Sensitive          1 ug/ml    TETRACYCLINE                     MIC      Resistant       >=16 ug/ml END OF REPORT      Studies: No results found.  Scheduled Meds: . ferrous fumarate  1 tablet Oral Daily  . heparin  5,000 Units Subcutaneous 3 times per day  . mirabegron ER  50 mg Oral Daily  . morphine  45 mg Oral Q12H  . oxybutynin  5 mg Oral TID  . pantoprazole  40 mg Oral Daily  . vancomycin  500 mg Intravenous Q12H   Continuous Infusions: . 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 If 7PM-7AM, please contact night-coverage at [email or web address], password [facility] 06/02/2015, 2:17 PM  LOS: 1 day

## 2015-06-02 NOTE — Evaluation (Signed)
Occupational Therapy Evaluation Patient Details Name: Kirsten Schroeder MRN: [id number] DOB: [birth year] Today's Date: 06/02/2015    History of Present Illness 60 y.o. female history of ovarian cancer followed by Dr. Marko Plume and currently going undergoing chemotherapy, hypertension, history of hydronephrosis secondary to right-sided stent placement who presents to [place] with complaints of intractable nausea and vomiting over the past 24 hours prior to hospital admission. In addition, patient did also report feeling markedly weak. In the emergency department, patient was noted have a urinalysis with large blood, large leukocytes, positive nitrites, and too numerous to count WBCs. Pt is presenting UTI with sepsis and generalized weakness   Clinical Impression   Pt currently feels she is close to baseline with her ADL activity. Daughter will A at home as needed.    Follow Up Recommendations  No OT follow up    Equipment Recommendations  None recommended by OT (daughter may obtain a shower seat if needed upon DC)    Recommendations for Other Services       Precautions / Restrictions Precautions Precautions: Fall Restrictions Weight Bearing Restrictions: No      Mobility Bed Mobility Overal bed mobility: Independent                Transfers Overall transfer level: Independent                         ADL Overall ADL's : At baseline                                       General ADL Comments: OT did educate pt and daughter on possible need for a shower seat in the shower for fall prevention               Pertinent Vitals/Pain Pain Assessment: No/denies pain     Hand Dominance     Extremity/Trunk Assessment Upper Extremity Assessment Upper Extremity Assessment: Generalized weakness           Communication Communication Communication: No difficulties   Cognition Arousal/Alertness: Awake/alert Behavior During Therapy: WFL  for tasks assessed/performed Overall Cognitive Status: Within Functional Limits for tasks assessed                                Home Living Family/patient expects to be discharged to:: Private residence Living Arrangements: Children Available Help at Discharge: Family;Available PRN/intermittently Type of Home: House Home Access: Level entry     Home Layout: One level     Bathroom Shower/Tub: Teacher, early years/pre: Standard     Home Equipment: None   Additional Comments: pt lives with daughter who works 3rd shift      Prior Functioning/Environment Level of Independence: Independent                      OT Goals(Current goals can be found in the care plan section) Acute Rehab OT Goals Patient Stated Goal: get to eat a hamburger  OT Frequency:                End of Session Nurse Communication: Mobility status  Activity Tolerance: Patient tolerated treatment well Patient left: in bed   Time: 1400-1412 OT Time Calculation (min): 12 min Charges:  OT General Charges $OT Visit: 1 Procedure OT Evaluation $OT  Eval Low Complexity: 1 Procedure G-Codes:    Betsy Pries 06-21-15, 2:20 PM

## 2015-06-02 NOTE — Progress Notes (Signed)
Pharmacy Antibiotic Follow-up Note  Kirsten Schroeder is a 60 y.o. year-old female admitted on 06/01/2015.  The patient is currently on day 2 of vancomycin for presumed enterococcal UTI; to transition to Levaquin today per pharmacy, given PCN allergy (cephalosporins will not cover enterococcus)  Assessment/Plan:  Levaquin 250 mg PO q24 hr  F/u SCr, clinical course  Recommended duration of therapy is 3 days for uncomplicated infections; 10 days (total) for complicated infections, including pyelonephritis.   Temp (24hrs), Avg:98.5 F (36.9 C), Min:98.3 F (36.8 C), Max:98.6 F (37 C)   Recent Labs Lab 05/27/15 1405 06/01/15 1431 06/02/15 0440  WBC 12.8* 18.4* 15.9*    Recent Labs Lab 06/01/15 1431 06/01/15 1836 06/02/15 0440  CREATININE 1.07* 1.01* 1.14*   Estimated Creatinine Clearance: 40.1 mL/min (by C-G formula based on Cr of 1.14).    Allergies  Allergen Reactions  . Penicillins Rash    As a child. Has patient had a PCN reaction causing immediate rash, facial/tongue/throat swelling, SOB or lightheadedness with hypotension: unknown Has patient had a PCN reaction causing severe rash involving mucus membranes or skin necrosis: unknown Has patient had a PCN reaction that required hospitalization: unknown Has patient had a PCN reaction occurring within the last 10 years: unknown If all of the above answers are "NO", then may proceed with Cephalosporin use.     Afeb, WBC down 15.9, SCr up slightly 1.14 (crcl~40)  Antimicrobials this admission: 1/8 Vanc >> 1/9 1/9 >> Levaquin >>  Levels/dose changes this admission:  Microbiology Results: 12/19 ucx: enterococcus --> treated with macrobid outpt 1/3 UCx: Enterococcus (S= ampi, LVQ, nitro, vanc; R= tetra) 1/8 UCx: IP 1/8 Bcx x2: IP   Thank you for allowing pharmacy to be a part of this patient's care.  Reuel Boom, PharmD, BCPS Pager: [phone] 06/02/2015, 7:08 PM

## 2015-06-02 NOTE — Evaluation (Signed)
Physical Therapy Evaluation Patient Details Name: Kirsten Schroeder MRN: [id number] DOB: 1955-06-29 Today's Date: 06/02/2015   History of Present Illness  61 y.o. female history of ovarian cancer followed by Dr. Marko Plume and currently going undergoing chemotherapy, hypertension, history of hydronephrosis secondary to right-sided stent placement who presents to [place] with complaints of intractable nausea and vomiting over the past 24 hours prior to hospital admission. In addition, patient did also report feeling markedly weak. In the emergency department, patient was noted have a urinalysis with large blood, large leukocytes, positive nitrites, and too numerous to count WBCs. Pt is presenting UTI with sepsis and generalized weakness  Clinical Impression  Pt appears at baseline level of functioning. Pt educated on safety for home and importance of exercise at home to keep her strength up. Pt states she has an exercise program at home using theraband that she plans to continue at d/c.  Pt states her daughter will assist her with mobility on days she is feeling "weak".  Pt with no further PT needs at this time.    Follow Up Recommendations No PT follow up    Equipment Recommendations  None recommended by PT    Recommendations for Other Services       Precautions / Restrictions Precautions Precautions: Fall Restrictions Weight Bearing Restrictions: No      Mobility  Bed Mobility Overal bed mobility: Independent                Transfers Overall transfer level: Independent                  Ambulation/Gait Ambulation/Gait assistance: Modified independent (Device/Increase time) Ambulation Distance (Feet): 120 Feet Assistive device: None   Gait velocity: slow   General Gait Details: pt with slowed gait velocity but no LOB with gait even with distractions and sudden stops  Science writer    Modified Rankin (Stroke Patients Only)       Balance                                             Pertinent Vitals/Pain Pain Assessment: No/denies pain    Home Living Family/patient expects to be discharged to:: Private residence Living Arrangements: Children Available Help at Discharge: Family;Available PRN/intermittently Type of Home: House Home Access: Level entry     Home Layout: One level Home Equipment: None Additional Comments: pt lives with daughter who works 3rd shift    Prior Function Level of Independence: Independent               Journalist, newspaper        Extremity/Trunk Assessment   Upper Extremity Assessment: Generalized weakness           Lower Extremity Assessment: Generalized weakness      Cervical / Trunk Assessment: Kyphotic  Communication   Communication: No difficulties  Cognition Arousal/Alertness: Awake/alert   Overall Cognitive Status:  (requires increased time for processing and problem solving)                      General Comments General comments (skin integrity, edema, etc.): performed tandem stance with no LOB, marching in place with no LOB    Exercises        Assessment/Plan    PT Assessment Patent does not  need any further PT services  PT Diagnosis     PT Problem List    PT Treatment Interventions     PT Goals (Current goals can be found in the Care Plan section) Acute Rehab PT Goals PT Goal Formulation: All assessment and education complete, DC therapy    Frequency     Barriers to discharge        Co-evaluation               End of Session Equipment Utilized During Treatment: Gait belt Activity Tolerance: Patient tolerated treatment well Patient left: in bed;with call bell/phone within reach;with bed alarm set           Time: [id number] PT Time Calculation (min) (ACUTE ONLY): 15 min   Charges:   PT Evaluation $PT Eval Moderate Complexity: 1 Procedure     PT G Codes:        Hue Steveson 06/03/15,  8:35 AM

## 2015-06-03 LAB — BASIC METABOLIC PANEL
ANION GAP: 10 (ref 5–15)
BUN: 9 mg/dL (ref 6–20)
CHLORIDE: 102 mmol/L (ref 101–111)
CO2: 24 mmol/L (ref 22–32)
Calcium: 8.4 mg/dL — ABNORMAL LOW (ref 8.9–10.3)
Creatinine, Ser: 0.96 mg/dL (ref 0.44–1.00)
GFR calc Af Amer: >60 mL/min (ref >60)
GLUCOSE: 94 mg/dL (ref 65–99)
POTASSIUM: 3.5 mmol/L (ref 3.5–5.1)
Sodium: 136 mmol/L (ref 135–145)

## 2015-06-03 LAB — CBC
HEMATOCRIT: 32.7 % — AB (ref 36.0–46.0)
HEMOGLOBIN: 10.4 g/dL — AB (ref 12.0–15.0)
MCH: 28.5 pg (ref 26.0–34.0)
MCHC: 31.8 g/dL (ref 30.0–36.0)
MCV: 89.6 fL (ref 78.0–100.0)
Platelets: 423 10*3/uL — ABNORMAL HIGH (ref 150–400)
RBC: 3.65 MIL/uL — ABNORMAL LOW (ref 3.87–5.11)
RDW: 17.9 % — ABNORMAL HIGH (ref 11.5–15.5)
WBC: 14.2 10*3/uL — ABNORMAL HIGH (ref 4.0–10.5)

## 2015-06-03 MED ORDER — LEVOFLOXACIN 250 MG PO TABS: 250.0000 mg | ORAL_TABLET | ORAL | Status: DC

## 2015-06-03 MED ORDER — HEPARIN SOD (PORK) LOCK FLUSH 100 UNIT/ML IV SOLN
500.0000 [IU] | Freq: Once | INTRAVENOUS | Status: AC
  Administered 2015-06-03: 500 [IU] via INTRAVENOUS
  Filled 2015-06-03: qty 5

## 2015-06-03 NOTE — Discharge Summary (Signed)
Physician Discharge Summary  Kirsten Schroeder [id number] DOB: [birth year] DOA: 06/01/2015  PCP: Kirsten Koyanagi, DO  Admit date: 06/01/2015 Discharge date: 06/03/2015  Time spent: 20 minutes  Recommendations for Outpatient Follow-up:  1. Follow up with PCP in 2-3 weeks 2. Follow up with Dr. Marko Schroeder as scheduled  3. Follow up with Urology as scheduled  Discharge Diagnoses:  Principal Problem:   Sepsis secondary to UTI [facility]) Active Problems:   Essential hypertension   UTI (urinary tract infection) due to Enterococcus   Ovarian cancer ([place])   Chemotherapy induced neutropenia (HCC)   Dysuria   Anemia associated with chemotherapy   International Federation of Gynecology and Obstetrics (FIGO) stage IVB epithelial ovarian cancer ([place])   Neoplastic malignant related fatigue   UTI (lower urinary tract infection)   Discharge Condition: Improved  Diet recommendation:  Regular  Filed Weights   06/01/15 1942  Weight: 50.7 kg (111 lb 12.4 oz)    History of present illness:  Please review dictated H and P from 1/8 for details. Briefly, 60 y.o. female history of ovarian cancer followed by Dr. Marko Schroeder and currently going undergoing chemotherapy, hypertension, history of hydronephrosis secondary to right-sided stent placement who presents to [place] with complaints of intractable nausea and vomiting over the past 24 hours prior to hospital admission. In addition, patient did also report feeling markedly weak. In the emergency department, patient was noted have a urinalysis with large blood, large leukocytes, positive nitrites, and too numerous to count WBCs. Patient was also found to have a presenting white blood cell count of 18.4 thousand and heart rate up to just over 100. Patient was started on empiric vancomycin. Given presenting UTI with sepsis and generalized weakness, hospitalist service was consult for consideration for admission  Hospital Course:  1. UTI from likely enterococcus with  sepsis present on admit 1. History of Enterococcus UTI that was multidrug sensitive 2. Pt with presenting leukocytosis and tachycardia to the 100's 3. Lactate normal 4. Failed PO macrobid as outpt 5. Given presentation, empirically started vanc to cover Enterococcus 6. Clinically much improved. Transitioned to PO levaquin based on recent urine culture results and patient remained stable 7. Plan to complete course of levaquin on discharge 2. Intractable nausea/vomiting 1. Secondary to presenting UTI 2. Continue anti-emetics as tolerated 3. Much improved 4. Advance diet to regular diet 3. HTN 1. Blood pressure currently stable and controlled 2. Continue to monitor 4. Ovarian cancer 1. Flagged Dr. Marko Schroeder of patient's admission 2. S/p chemo 2 weeks prior 5. Weakness 1. Patient reportedly fell in emergency department secondary to generalized weakness 2. Seen by PT, no f/u recommended 6. DVT prophylaxis 1. Heparin subcutaneous while inpatient  Discharge Exam: Filed Vitals:   06/01/15 2204 06/02/15 0602 06/02/15 1315 06/02/15 2051  BP: 123/55 112/50 130/61 120/54  Pulse: 103 110 105 101  Temp: 98.3 F (36.8 C) 98.4 F (36.9 C) 98.6 F (37 C) 98.9 F (37.2 C)  TempSrc: Oral Oral Oral Oral  Resp: 18 18 16 16   Height:      Weight:      SpO2: 96% 95% 98% 97%    General: Awake, in nad Cardiovascular: regular, s1, s2 Respiratory: normal resp effort, no wheezing  Discharge Instructions     Medication List    STOP taking these medications        fluconazole 100 MG tablet  Commonly known as:  DIFLUCAN     nitrofurantoin (macrocrystal-monohydrate) 100 MG capsule  Commonly known as:  MACROBID  TAKE these medications        acyclovir 200 MG capsule  Commonly known as:  ZOVIRAX  Take 1 capsule (200 mg total) by mouth 3 (three) times daily as needed. For fever blister     AZO TABS PO  Take 1 tablet by mouth 3 (three) times daily.     CALCIUM + D PO  Take 1  tablet by mouth daily.     FERROCITE PLUS 106-1 MG Tabs  Take 1 tablet by mouth daily.     levofloxacin 250 MG tablet  Commonly known as:  LEVAQUIN  Take 1 tablet (250 mg total) by mouth daily.     lidocaine-prilocaine cream  Commonly known as:  EMLA  Apply 1 application topically as needed.     LORazepam 0.5 MG tablet  Commonly known as:  ATIVAN  Take 1 tablet (0.5 mg total) by mouth every 6 (six) hours as needed (for nausea).     megestrol 40 MG tablet  Commonly known as:  MEGACE  Take 1 tablet (40 mg total) by mouth daily. Hormone pill for appetite     mirabegron ER 50 MG Tb24 tablet  Commonly known as:  MYRBETRIQ  Take 50 mg by mouth daily.     morphine 15 MG 12 hr tablet  Commonly known as:  MS CONTIN  Take 1 tablet (15 mg total) by mouth every 12 (twelve) hours. To equal total 45 mg dose of MS Contin     morphine 15 MG tablet  Commonly known as:  MSIR  Take 1-2 tablets (15-30 mg total) by mouth every 4 (four) hours as needed for severe pain.     morphine 30 MG 12 hr tablet  Commonly known as:  MS CONTIN  Take 1 tablet (30 mg total) by mouth every 12 (twelve) hours.     multivitamin with minerals Tabs tablet  Take 1 tablet by mouth daily.     olaparib 50 MG capsule  Commonly known as:  LYNPARZA  Take 8 capsules (400 mg total) by mouth 2 (two) times daily. Swallow whole.     ondansetron 8 MG tablet  Commonly known as:  ZOFRAN  Take 1-2 tablets (8-16 mg total) by mouth every 12 (twelve) hours as needed for nausea.     oxybutynin 5 MG tablet  Commonly known as:  DITROPAN  TAKE 1 TABLET(5 MG) BY MOUTH THREE TIMES DAILY     pantoprazole 40 MG tablet  Commonly known as:  PROTONIX  Take 1 tablet (40 mg total) by mouth daily.     sodium chloride 0.65 % nasal spray  Commonly known as:  OCEAN  Place 1 spray into the nose as needed. Use every 1-3 hours while awake to keep nose moist, which helps to prevent nosebleeds       Allergies  Allergen Reactions  .  Penicillins Rash    As a child. Has patient had a PCN reaction causing immediate rash, facial/tongue/throat swelling, SOB or lightheadedness with hypotension: unknown Has patient had a PCN reaction causing severe rash involving mucus membranes or skin necrosis: unknown Has patient had a PCN reaction that required hospitalization: unknown Has patient had a PCN reaction occurring within the last 10 years: unknown If all of the above answers are "NO", then may proceed with Cephalosporin use.    Follow-up Information    Follow up with Kirsten Koyanagi, DO. Schedule an appointment as soon as possible for a visit in 2 weeks.   Specialty:  Family  Medicine   Contact information:   [address] [place] [address] [phone]       Schedule an appointment as soon as possible for a visit with Gordy Levan, MD.   Specialty:  Oncology   Contact information:   [place] [address] [phone]       Schedule an appointment as soon as possible for a visit with Jorja Loa, MD.   Specialty:  Urology   Contact information:   [address] [address] [phone]        The results of significant diagnostics from this hospitalization (including imaging, microbiology, ancillary and laboratory) are listed below for reference.    Significant Diagnostic Studies: No results found.  Microbiology: Recent Results (from the past 240 hour(s))  Urine Culture     Status: None   Collection Time: 05/27/15  2:06 PM  Result Value Ref Range Status   Urine Culture, Routine Culture, Urine  Final    Comment: Final - ===== COLONY COUNT: ===== >=100,000 COLONIES/ML ENTEROCOCCUS SPECIES  ------------------------------------------------------------------------  ENTEROCOCCUS SPECIES     AMPICILLIN                       MIC      Sensitive        <=2 ug/ml    LEVOFLOXACIN                     MIC      Sensitive          2 ug/ml    NITROFURANTOIN                    MIC      Sensitive       <=16 ug/ml    VANCOMYCIN                       MIC      Sensitive          1 ug/ml    TETRACYCLINE                     MIC      Resistant       >=16 ug/ml END OF REPORT      Labs: Basic Metabolic Panel:  Recent Labs Lab 06/01/15 1431 06/01/15 1836 06/02/15 0440 06/03/15 0548  NA 132*  --  136 136  K 4.1  --  3.8 3.5  CL 98*  --  104 102  CO2 23  --  24 24  GLUCOSE 109*  --  85 94  BUN 9  --  10 9  CREATININE 1.07* 1.01* 1.14* 0.96  CALCIUM 8.6*  --  8.3* 8.4*   Liver Function Tests:  Recent Labs Lab 06/01/15 1431 06/02/15 0440  AST 22 20  ALT 14 13*  ALKPHOS 148* 138*  BILITOT 0.8 1.0  PROT 6.2* 5.4*  ALBUMIN 2.7* 2.3*    Recent Labs Lab 06/01/15 1431  LIPASE 12   No results for input(s): AMMONIA in the last 168 hours. CBC:  Recent Labs Lab 05/27/15 1405 06/01/15 1431 06/02/15 0440 06/03/15 0548  WBC 12.8* 18.4* 15.9* 14.2*  NEUTROABS 11.3*  --   --   --   HGB 8.8* 10.9* 9.7* 10.4*  HCT 27.2* 33.8* 31.1* 32.7*  MCV 84.7 86.9 89.9 89.6  PLT 418* 368 354 423*  Cardiac Enzymes: No results for input(s): CKTOTAL, CKMB, CKMBINDEX, TROPONINI in the last 168 hours. BNP: BNP (last 3 results) No results for input(s): BNP in the last 8760 hours.  ProBNP (last 3 results) No results for input(s): PROBNP in the last 8760 hours.  CBG: No results for input(s): GLUCAP in the last 168 hours.  Signed:  CHIU, STEPHEN K  [facility] 06/03/2015, 10:17 AM

## 2015-06-04 ENCOUNTER — Other Ambulatory Visit: Payer: Self-pay

## 2015-06-04 DIAGNOSIS — C569 Malignant neoplasm of unspecified ovary: Secondary | ICD-10-CM

## 2015-06-04 DIAGNOSIS — R300 Dysuria: Secondary | ICD-10-CM

## 2015-06-04 DIAGNOSIS — C772 Secondary and unspecified malignant neoplasm of intra-abdominal lymph nodes: Secondary | ICD-10-CM

## 2015-06-04 LAB — URINE CULTURE

## 2015-06-04 MED ORDER — MORPHINE SULFATE ER 15 MG PO TBCR: 15.0000 mg | EXTENDED_RELEASE_TABLET | Freq: Two times a day (BID) | ORAL | Status: DC

## 2015-06-04 NOTE — Telephone Encounter (Signed)
Kirsten Schroeder will pick up the prescription from the injection nurse tomorrow 06-04-14.

## 2015-06-07 LAB — CULTURE, BLOOD (ROUTINE X 2)
CULTURE: NO GROWTH
CULTURE: NO GROWTH

## 2015-06-10 ENCOUNTER — Telehealth: Payer: Self-pay

## 2015-06-10 NOTE — Telephone Encounter (Signed)
Kirsten Schroeder states that she is still having burning and pain with urination.  It is not as bad as it was 06-01-15 when she went to the ED.  The pain and burning  improved  but never resolved from ATB in the hospital or DC oral ATB.  She is afebrile. Feet are still swollen since hospitalization 06-01-15. She will see Dr. Diona Fanti tomorrow 06-11-15 at 0900.  Keep appointment with Dr.[name] as scheduled on 06-16-15.

## 2015-06-10 NOTE — Telephone Encounter (Signed)
-----   Message from Gordy Levan, MD sent at 06/08/2015  5:33 PM EST ----- RN please call to check on her 1-16 or 1-17, as she was hospitalized last week with sepsis from UTI. To see me 1-23 Thanks

## 2015-06-13 ENCOUNTER — Other Ambulatory Visit: Payer: Self-pay

## 2015-06-13 DIAGNOSIS — C569 Malignant neoplasm of unspecified ovary: Secondary | ICD-10-CM

## 2015-06-15 ENCOUNTER — Other Ambulatory Visit: Payer: Self-pay | Admitting: Oncology

## 2015-06-16 ENCOUNTER — Encounter: Payer: Self-pay | Admitting: Oncology

## 2015-06-16 ENCOUNTER — Telehealth: Payer: Self-pay | Admitting: Oncology

## 2015-06-16 ENCOUNTER — Ambulatory Visit (HOSPITAL_BASED_OUTPATIENT_CLINIC_OR_DEPARTMENT_OTHER): Payer: BC Managed Care – PPO

## 2015-06-16 ENCOUNTER — Ambulatory Visit (HOSPITAL_COMMUNITY)
Admission: RE | Admit: 2015-06-16 | Discharge: 2015-06-16 | Disposition: A | Discharge status: Home / Self Care | Payer: BC Managed Care – PPO | Source: Ambulatory Visit | Attending: Oncology | Admitting: Oncology

## 2015-06-16 ENCOUNTER — Other Ambulatory Visit (HOSPITAL_BASED_OUTPATIENT_CLINIC_OR_DEPARTMENT_OTHER): Payer: BC Managed Care – PPO

## 2015-06-16 ENCOUNTER — Encounter: Payer: Self-pay | Admitting: General Practice

## 2015-06-16 ENCOUNTER — Ambulatory Visit: Payer: BC Managed Care – PPO | Admitting: Oncology

## 2015-06-16 ENCOUNTER — Other Ambulatory Visit: Payer: Self-pay | Admitting: Urology

## 2015-06-16 VITALS — BP 124/84 | HR 125 | Temp 98.7°F | Resp 18 | Ht 61.0 in | Wt 118.1 lb

## 2015-06-16 DIAGNOSIS — J90 Pleural effusion, not elsewhere classified: Secondary | ICD-10-CM

## 2015-06-16 DIAGNOSIS — C772 Secondary and unspecified malignant neoplasm of intra-abdominal lymph nodes: Secondary | ICD-10-CM

## 2015-06-16 DIAGNOSIS — R918 Other nonspecific abnormal finding of lung field: Secondary | ICD-10-CM | POA: Insufficient documentation

## 2015-06-16 DIAGNOSIS — R634 Abnormal weight loss: Secondary | ICD-10-CM

## 2015-06-16 DIAGNOSIS — C7800 Secondary malignant neoplasm of unspecified lung: Secondary | ICD-10-CM | POA: Diagnosis not present

## 2015-06-16 DIAGNOSIS — C569 Malignant neoplasm of unspecified ovary: Secondary | ICD-10-CM

## 2015-06-16 DIAGNOSIS — C787 Secondary malignant neoplasm of liver and intrahepatic bile duct: Secondary | ICD-10-CM

## 2015-06-16 DIAGNOSIS — Z95828 Presence of other vascular implants and grafts: Secondary | ICD-10-CM

## 2015-06-16 DIAGNOSIS — N1330 Unspecified hydronephrosis: Secondary | ICD-10-CM

## 2015-06-16 DIAGNOSIS — T8384XD Pain from genitourinary prosthetic devices, implants and grafts, subsequent encounter: Secondary | ICD-10-CM

## 2015-06-16 DIAGNOSIS — C801 Malignant (primary) neoplasm, unspecified: Secondary | ICD-10-CM | POA: Diagnosis not present

## 2015-06-16 DIAGNOSIS — I890 Lymphedema, not elsewhere classified: Secondary | ICD-10-CM

## 2015-06-16 DIAGNOSIS — R300 Dysuria: Secondary | ICD-10-CM

## 2015-06-16 DIAGNOSIS — G620 Drug-induced polyneuropathy: Secondary | ICD-10-CM

## 2015-06-16 DIAGNOSIS — B001 Herpesviral vesicular dermatitis: Secondary | ICD-10-CM

## 2015-06-16 LAB — COMPREHENSIVE METABOLIC PANEL
ALBUMIN: 2.4 g/dL — AB (ref 3.5–5.0)
ALK PHOS: 192 U/L — AB (ref 40–150)
ALT: 15 U/L (ref 0–55)
AST: 32 U/L (ref 5–34)
Anion Gap: 11 mEq/L (ref 3–11)
BILIRUBIN TOTAL: 0.88 mg/dL (ref 0.20–1.20)
BUN: 8.7 mg/dL (ref 7.0–26.0)
CALCIUM: 8.6 mg/dL (ref 8.4–10.4)
CHLORIDE: 105 meq/L (ref 98–109)
CO2: 21 mEq/L — ABNORMAL LOW (ref 22–29)
Creatinine: 0.9 mg/dL (ref 0.6–1.1)
EGFR: 74 mL/min/{1.73_m2} — AB (ref >90)
GLUCOSE: 113 mg/dL (ref 70–140)
POTASSIUM: 3.8 meq/L (ref 3.5–5.1)
Sodium: 137 mEq/L (ref 136–145)
Total Protein: 6.1 g/dL — ABNORMAL LOW (ref 6.4–8.3)

## 2015-06-16 LAB — CBC WITH DIFFERENTIAL/PLATELET
BASO%: 0.5 % (ref 0.0–2.0)
BASOS ABS: 0.1 10*3/uL (ref 0.0–0.1)
EOS ABS: 0 10*3/uL (ref 0.0–0.5)
EOS%: 0.4 % (ref 0.0–7.0)
HEMATOCRIT: 29.5 % — AB (ref 34.8–46.6)
HEMOGLOBIN: 9.5 g/dL — AB (ref 11.6–15.9)
LYMPH#: 0.4 10*3/uL — AB (ref 0.9–3.3)
LYMPH%: 3.2 % — ABNORMAL LOW (ref 14.0–49.7)
MCH: 28.7 pg (ref 25.1–34.0)
MCHC: 32.2 g/dL (ref 31.5–36.0)
MCV: 89 fL (ref 79.5–101.0)
MONO#: 0.7 10*3/uL (ref 0.1–0.9)
MONO%: 6.5 % (ref 0.0–14.0)
NEUT#: 10.1 10*3/uL — ABNORMAL HIGH (ref 1.5–6.5)
NEUT%: 89.4 % — AB (ref 38.4–76.8)
Platelets: 375 10*3/uL (ref 145–400)
RBC: 3.31 10*6/uL — ABNORMAL LOW (ref 3.70–5.45)
RDW: 21.9 % — AB (ref 11.2–14.5)
WBC: 11.3 10*3/uL — ABNORMAL HIGH (ref 3.9–10.3)

## 2015-06-16 MED ORDER — ACYCLOVIR 200 MG PO CAPS: 200.0000 mg | ORAL_CAPSULE | Freq: Three times a day (TID) | ORAL | Status: AC | PRN

## 2015-06-16 MED ORDER — HEPARIN SOD (PORK) LOCK FLUSH 100 UNIT/ML IV SOLN
500.0000 [IU] | Freq: Once | INTRAVENOUS | Status: AC
  Administered 2015-06-16: 500 [IU] via INTRAVENOUS
  Filled 2015-06-16: qty 5

## 2015-06-16 MED ORDER — SODIUM CHLORIDE 0.9 % IJ SOLN
10.0000 mL | INTRAMUSCULAR | Status: DC | PRN
  Administered 2015-06-16: 10 mL via INTRAVENOUS
  Filled 2015-06-16: qty 10

## 2015-06-16 MED ORDER — MORPHINE SULFATE ER 30 MG PO TBCR: 30.0000 mg | EXTENDED_RELEASE_TABLET | Freq: Two times a day (BID) | ORAL | Status: AC

## 2015-06-16 NOTE — Telephone Encounter (Signed)
Appointments made and avs printed °

## 2015-06-16 NOTE — Progress Notes (Signed)
Spiritual Care Note  Followed up with Baker Janus and daughter Estill Bamberg, meeting Raiden's sister Angie as well, after appt with Dr Marko Plume.  Ms Javed was tired and slumped forward in her wheelchair; per pt, she was sleepy from morphine.  When processing discernment about whether to accept hospice referral, Ms Deniston named that extra help at home would be welcome and relieving, even as she asserted, "I still do my own laundry and dishes"--one of her personal benchmarks of independence.  Despite her low energy and affect, she sought reassurance that I would still check on her as part of her support team.  We plan for me to f/u by phone in early afternoon (per pt, noon-2pm is her most energetic window).  I continue to offer spiritual care as a safe place to process emotions and questions, including the "hard ones."  Family appeared to value normalization of hospice support.    [place], [place], [facility] Pager [phone] Voicemail  [phone]

## 2015-06-16 NOTE — Telephone Encounter (Signed)
Medical Oncology  Spoke with patient by phone with results of CXR done today, with new large right pleural effusion as well as bilateral pulmonary mets. Recommended thoracentesis by IR, which she is glad to try. Order placed.  Stent change now scheduled for Feb 6 by Dr Diona Fanti.  Godfrey Pick, MD

## 2015-06-16 NOTE — Progress Notes (Signed)
OFFICE PROGRESS NOTE   June 22, 2015   Physicians:ClarkePearson, Quillian Quince; Evalee Mutton, Marlene Bast, Floy Sabina, Annie Main.; Gery Pray.   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 She was hospitalized with urosepsis 1-8 thru 06-03-15. She has not been on any cancer therapy since rapid decrease in platelets and hemoglobin when briefly on olaparib (12-10 thru 05-11-15), and continues very symptomatic with multiple issues.  Patient has been extremely uncomfortable for past 24 hours with urinary pressure, urgency and dysuria. She is on nitrofurantoin 100 mg daily planned until stent change anticipated in near future tho not yet scheduled. Urine culture at visit with Dr Diona Fanti on 06-11-15 was negative per patient. She did not sleep last pm with bladder symptoms. She is trying to eat, but continues to lose weight. She is more SOB, at rest and with exertion. Oxygen during visit today seemed helpful. Daughter insists that she can walk from bed to BR, distance close.No productive cough, no chest pain. No fever. No vomiting. LE swelling at baseline. Bowels moving via colostomy. No problems with PAC. No HA or neurologic symptoms. Low pelvic pain still controlled since that radiation Remainder of 10 point Review of Systems unchanged.    PAC in, flushed 06-16-15 Right JJ stent Genetics testing.sent 02-19-15 and revealed a mutation in the BRCA2 gene [id number]. (BreastOvarian panel by [facility]) CA 125 at diagnosis 5626  Flu vaccine 03-03-15  ONCOLOGIC HISTORY Patient presented with RLE swelling negative for DVT July 2014, and right hydronephrosis. Biopsy of left inguinal node ([id number]) found carcinoma consistent with serous gyn primary.Neoadjuvant dose dense taxol carboplatin began 01-31-13, with improvement in pelvic mass and adenopathy by scans after 3 cycles..  She had 3 additional cycles, with progressive decrease in CA 125 from 5626 in 12-2012 to 596 by Jan 2015. CT 06-14-13 had ~ stable pelvic mass and adenopathy. Dr Josephina Shih recommended another 3 cycles chemotherapy, regimen changed to q 3 week taxol carboplatin on 06-27-13, which unfortunately caused worsening of peripheral neuropathy and severe taxol aches.Taxotere was substituted for taxol due to neuropathy in feet for cycles 8 and 9, completed 08-08-13. CA 125 improved to low of 494 in Feb 2015, then 732 just prior to cycle 9. CT AP 08-22-13 had minimal improvement, still not adequate to consider debulking surgery. She received doxil for 6 cycles, from 09-19-13 thru 02-06-14. CT AP 02-27-14 had some early increase in central pelvic mass, with treatment changed to gemzar beginning 03-29-14. CA 125 baseline for gemzar was 1932 on 03-27-14. Cycle 4 gemzar was given 05-22-14, with CA 125 2680. CDDP was added to gemzar beginning 06-05-14. She had cycle 5 CDDP gemzar on 09-04-14. Treatment changed to tamoxifen 09-30-14 due to poor tolerance of CDDP gemzar without clinical improvement. CT AP 12-13-14 compared with 06-2014 had some increase in the pelvic mass, but no new areas of involvement. Radiation treatment dates: 01/15/2015-02/19/2015 Site/dose: Pelvic mass, 45 gray in 25 fractions.  CT 03-25-15 had new lung and liver metastases. BRCA 2 mutation documented, olaparib begun 05-03-15.  Objective:  Vital signs in last 24 hours:  BP 124/84 mmHg  Pulse 125  Temp(Src) 98.7 F (37.1 C) (Oral)  Resp 18  Ht 5' 1"  (1.549 m)  Wt 118 lb 1.6 oz (53.57 kg)  BMI 22.33 kg/m2  SpO2 91% Weight up from 111 on 06-01-15, tho she does not appear to have gained. Alert, oriented, looks weak and uncomfortable, appropriate conversation,  cooperative. Tearful as we discuss status.  Able to walk from [facility] into bathroom, several times during visit.  Pale, not icteric. Respirations labored with minimal exertion. O2 sat increased from 91 at  rest to 97 with 2 liters O2  HEENT:PERRL, sclerae not icteric. Oral mucosa a little dry without lesions, posterior pharynx clear.  Neck supple. No JVD.  Lymphatics:no cervical,supraclavicular adenopathy Resp: diminished BS, no wheezes or rales Cardio: regular rate and rhythm. No gallop. GI: soft, nontender, not distended, no mass or organomegaly. Some bowel sounds. Colostomy Musculoskeletal/ Extremities: without pitting edema, cords, tenderness Neuro: no peripheral neuropathy. Otherwise nonfocal Skin without rash, ecchymosis, petechiae Breasts: without dominant mass, skin or nipple findings. Axillae benign. Portacath-without erythema or tenderness  Lab Results:  Results for orders placed or performed in visit on 06/16/15  CBC with Differential  Result Value Ref Range   WBC 11.3 (H) 3.9 - 10.3 10e3/uL   NEUT# 10.1 (H) 1.5 - 6.5 10e3/uL   HGB 9.5 (L) 11.6 - 15.9 g/dL   HCT 29.5 (L) 34.8 - 46.6 %   Platelets 375 145 - 400 10e3/uL   MCV 89.0 79.5 - 101.0 fL   MCH 28.7 25.1 - 34.0 pg   MCHC 32.2 31.5 - 36.0 g/dL   RBC 3.31 (L) 3.70 - 5.45 10e6/uL   RDW 21.9 (H) 11.2 - 14.5 %   lymph# 0.4 (L) 0.9 - 3.3 10e3/uL   MONO# 0.7 0.1 - 0.9 10e3/uL   Eosinophils Absolute 0.0 0.0 - 0.5 10e3/uL   Basophils Absolute 0.1 0.0 - 0.1 10e3/uL   NEUT% 89.4 (H) 38.4 - 76.8 %   LYMPH% 3.2 (L) 14.0 - 49.7 %   MONO% 6.5 0.0 - 14.0 %   EOS% 0.4 0.0 - 7.0 %   BASO% 0.5 0.0 - 2.0 %  Comprehensive metabolic panel  Result Value Ref Range   Sodium 137 136 - 145 mEq/L   Potassium 3.8 3.5 - 5.1 mEq/L   Chloride 105 98 - 109 mEq/L   CO2 21 (L) 22 - 29 mEq/L   Glucose 113 70 - 140 mg/dl   BUN 8.7 7.0 - 26.0 mg/dL   Creatinine 0.9 0.6 - 1.1 mg/dL   Total Bilirubin 0.88 0.20 - 1.20 mg/dL   Alkaline Phosphatase 192 (H) 40 - 150 U/L   AST 32 5 - 34 U/L   ALT 15 0 - 55 U/L   Total Protein 6.1 (L) 6.4 - 8.3 g/dL   Albumin 2.4 (L) 3.5 - 5.0 g/dL   Calcium 8.6 8.4 - 10.4 mg/dL   Anion Gap 11 3 - 11 mEq/L    EGFR 74 (L) >90 ml/min/1.73 m2     Studies/Results:  CXR obtained after visit shows new large right pleural effusion and innumerable bilateral pulmonary nodules, heart normal size, no left effusion, no acute bony abnormality  Medications: I have reviewed the patient's current medications.  DISCUSSION Patient, family and I agree that she is not strong enough to attempt olaparib again. Daughter initially requests that patient have stent replaced, then decide course from there. Daughter will follow up with urology office this afternoon. Patient and sister seem willing to consider Hospice now, as they both indicate that more help at home would be useful. Patient in tears during this discussion with me.  Patient and family then met with [facility] chaplain. By conclusion of their discussion, all were in agreement with referral to Hospice, which I have made directly to Hospice intake staff now. We have requested home  O2 prn by Hospice.  Following visit, CXR information shared with patient by phone. She agrees with IR thoracentesis for symptom management, that ordered.   Assessment/Plan:   1. 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 She / and especially family preferred to continue some treatment in preference to strict comfort care. Took olaparib from 12-10 thru 05-11-15, held since then for lower counts and will not resume. Progressive decline in performance status, poorly controlled pain, bladder symptoms, new SOB with large right pleural effusion. Therapeutic IR thoracentesis requested for symptoms. They have agreed to Hospice referral now. I will see her back either as scheduled on 07-07-15, or as needed if situation better managed with Hospice assistance.  2.Right hydronephrosis with JJ stent, urosepsis early Jan, on prophylactic nitrofurantoin and for stent change upcoming. Tho she is  doing poorly overall, she is miserable with the stent and willing to have stent change if that would be helpful. Urology has thought removal of the stent would likely cause more discomfort than maintaining this. Left kidney ok.  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 The ileostomy helped her tolerate pelvic RT and has avoided narcotic constipation. 4.daughter having very difficult time with accepting her mother's terminal illness. 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 Progressive weight loss: despite megace and their efforts, related to metastatic cancer. 10. Flu vaccine 03-03-15 11.Advance directives in place: daughter and son HCPOA, limited interventions including hospitalization but not including intubation, mechanical ventilation, intensive care unit. They have met with palliative care liason previously.  12.GERD symptoms some better on protonix, continue 13.oral thrush resolved with diflucan   All questions answered and they know to call if needed at any time. Time spent 25 min including >50% counseling and coordination of care. CC Dr Diona Fanti    Gordy Levan, MD   06/22/2015, 12:21 PM

## 2015-06-17 ENCOUNTER — Telehealth: Payer: Self-pay | Admitting: Oncology

## 2015-06-17 ENCOUNTER — Other Ambulatory Visit: Payer: Self-pay | Admitting: Oncology

## 2015-06-17 NOTE — Telephone Encounter (Signed)
Medical Oncology  IR thoracentesis not yet scheduled in EMR, order placed 06-16-15 Spoke with [facility] and they will contact patient to set up  L.Marko Plume, MD

## 2015-06-18 ENCOUNTER — Telehealth: Payer: Self-pay

## 2015-06-18 NOTE — Telephone Encounter (Signed)
Kirsten Schroeder was admitted to Hospice services. Tonya given order for DNR form for patient to keep at home to be signed by [facility] physician.   O2  2 liters  viaNC prn.

## 2015-06-22 DIAGNOSIS — Z95828 Presence of other vascular implants and grafts: Secondary | ICD-10-CM | POA: Insufficient documentation

## 2015-06-22 DIAGNOSIS — J90 Pleural effusion, not elsewhere classified: Secondary | ICD-10-CM | POA: Insufficient documentation

## 2015-06-23 ENCOUNTER — Ambulatory Visit (HOSPITAL_COMMUNITY)
Admission: RE | Admit: 2015-06-23 | Discharge: 2015-06-23 | Disposition: A | Discharge status: Home / Self Care | Payer: BC Managed Care – PPO | Source: Ambulatory Visit | Attending: Oncology | Admitting: Oncology

## 2015-06-23 ENCOUNTER — Ambulatory Visit (HOSPITAL_COMMUNITY)
Admission: RE | Admit: 2015-06-23 | Discharge: 2015-06-23 | Disposition: A | Discharge status: Home / Self Care | Payer: BC Managed Care – PPO | Source: Ambulatory Visit | Attending: Radiology | Admitting: Radiology

## 2015-06-23 DIAGNOSIS — Z9889 Other specified postprocedural states: Secondary | ICD-10-CM | POA: Diagnosis not present

## 2015-06-23 DIAGNOSIS — J948 Other specified pleural conditions: Secondary | ICD-10-CM | POA: Diagnosis not present

## 2015-06-23 DIAGNOSIS — J90 Pleural effusion, not elsewhere classified: Secondary | ICD-10-CM

## 2015-06-23 NOTE — Procedures (Signed)
Ultrasound-guided diagnostic and therapeutic right thoracentesis performed yielding 1.5 liters of yellow  fluid. No immediate complications. Follow-up chest x-ray pending.The fluid was sent to the lab for cytology. Only the above amount of fluid was removed today due to pt coughing and initial tap.

## 2015-06-24 ENCOUNTER — Encounter (HOSPITAL_BASED_OUTPATIENT_CLINIC_OR_DEPARTMENT_OTHER): Payer: Self-pay | Admitting: *Deleted

## 2015-06-24 NOTE — Progress Notes (Addendum)
NPO AFTER MN.  ARRIVE AT [id number].  NEEDS ISTAT 8.  WILL TAKE MSIR, MS CONTIN, PROTONIX, AND OXYBUTYNIN AM DOS W/ SIPS OF WATER.  PT IS ON OXYGEN PRN VIA NASAL CANULA, STATES WEANING OFF.  REVIEWED CHART W/ DR HOLLIS MDA, STATES PT WILL ASSESSED DOS IF REPEATED CXR IS NEEDED.

## 2015-06-25 ENCOUNTER — Telehealth: Payer: Self-pay

## 2015-06-25 NOTE — Telephone Encounter (Signed)
Told Tonya,RN Dr. Mariana Kaufman response to Tingling and numbness in right hand as noted below. Tonya verbalized understanding.

## 2015-06-25 NOTE — Telephone Encounter (Signed)
Please let Hospice know that Dr Diona Fanti will try to do something with the ureteral stent in ~ a week.  I told him that whatever he thinks best for her comfort is what we need, which may be changing the stent, tho I also told him that if he thinks best to remove it, I would agree with that as we are no longer treating and need to get her as comfortable as possible.  I am sure he will discuss with patient probably at time of procedure.   thanks

## 2015-06-25 NOTE — Telephone Encounter (Signed)
While speaking with Gwinda Passe with Hospice about Ms. Hol's right hand, relayed the information about Dr. Mariana Kaufman discussion with Dr. Diona Fanti regarding ureteral sent procedure for 06-30-15. Tonya appreciated the information.

## 2015-06-25 NOTE — Telephone Encounter (Signed)
[name] called stating that Kirsten Schroeder is that her right hand has tingling and numbness that occasionally it is causing her to drop items that she is holding. Would Dr. Marko Plume consider  Neurontin?

## 2015-06-25 NOTE — Telephone Encounter (Signed)
Re right hand tingling and numbness I am not sure why this is happening, wonder if might be positional as if she sleeps with hands/ wrists bent. Fine for family to massage.  Maybe hospice RN could let us know how it is, but would not add another med now  thanks

## 2015-06-30 ENCOUNTER — Encounter (HOSPITAL_BASED_OUTPATIENT_CLINIC_OR_DEPARTMENT_OTHER)
Admission: RE | Disposition: A | Discharge status: Home / Self Care | Payer: Self-pay | Source: Ambulatory Visit | Attending: Urology

## 2015-06-30 ENCOUNTER — Encounter (HOSPITAL_BASED_OUTPATIENT_CLINIC_OR_DEPARTMENT_OTHER): Payer: Self-pay | Admitting: Anesthesiology

## 2015-06-30 ENCOUNTER — Ambulatory Visit (HOSPITAL_BASED_OUTPATIENT_CLINIC_OR_DEPARTMENT_OTHER)
Admission: RE | Admit: 2015-06-30 | Discharge: 2015-06-30 | Disposition: A | Discharge status: Home / Self Care | Payer: BC Managed Care – PPO | Source: Ambulatory Visit | Attending: Urology | Admitting: Urology

## 2015-06-30 ENCOUNTER — Ambulatory Visit (HOSPITAL_BASED_OUTPATIENT_CLINIC_OR_DEPARTMENT_OTHER): Payer: BC Managed Care – PPO | Admitting: Anesthesiology

## 2015-06-30 DIAGNOSIS — C569 Malignant neoplasm of unspecified ovary: Secondary | ICD-10-CM | POA: Insufficient documentation

## 2015-06-30 DIAGNOSIS — I129 Hypertensive chronic kidney disease with stage 1 through stage 4 chronic kidney disease, or unspecified chronic kidney disease: Secondary | ICD-10-CM | POA: Insufficient documentation

## 2015-06-30 DIAGNOSIS — Z9981 Dependence on supplemental oxygen: Secondary | ICD-10-CM | POA: Diagnosis not present

## 2015-06-30 DIAGNOSIS — C7802 Secondary malignant neoplasm of left lung: Secondary | ICD-10-CM | POA: Diagnosis not present

## 2015-06-30 DIAGNOSIS — G709 Myoneural disorder, unspecified: Secondary | ICD-10-CM | POA: Diagnosis not present

## 2015-06-30 DIAGNOSIS — Z932 Ileostomy status: Secondary | ICD-10-CM | POA: Insufficient documentation

## 2015-06-30 DIAGNOSIS — N182 Chronic kidney disease, stage 2 (mild): Secondary | ICD-10-CM | POA: Diagnosis not present

## 2015-06-30 DIAGNOSIS — N1330 Unspecified hydronephrosis: Secondary | ICD-10-CM | POA: Diagnosis not present

## 2015-06-30 DIAGNOSIS — J90 Pleural effusion, not elsewhere classified: Secondary | ICD-10-CM | POA: Diagnosis not present

## 2015-06-30 DIAGNOSIS — C772 Secondary and unspecified malignant neoplasm of intra-abdominal lymph nodes: Secondary | ICD-10-CM | POA: Insufficient documentation

## 2015-06-30 DIAGNOSIS — C7801 Secondary malignant neoplasm of right lung: Secondary | ICD-10-CM | POA: Diagnosis not present

## 2015-06-30 DIAGNOSIS — C7951 Secondary malignant neoplasm of bone: Secondary | ICD-10-CM | POA: Insufficient documentation

## 2015-06-30 HISTORY — DX: Ileostomy status: Z93.2

## 2015-06-30 HISTORY — DX: Dependence on supplemental oxygen: Z99.81

## 2015-06-30 HISTORY — DX: Pleural effusion, not elsewhere classified: J90

## 2015-06-30 HISTORY — DX: Other chronic pain: G89.29

## 2015-06-30 HISTORY — DX: Secondary malignant neoplasm of unspecified lung: C78.00

## 2015-06-30 HISTORY — DX: Personal history of other infectious and parasitic diseases: Z86.19

## 2015-06-30 HISTORY — DX: Chronic kidney disease, stage 2 (mild): N18.2

## 2015-06-30 HISTORY — PX: CYSTOSCOPY W/ URETERAL STENT PLACEMENT: SHX1429

## 2015-06-30 HISTORY — DX: Opioid use, unspecified, uncomplicated: F11.90

## 2015-06-30 LAB — POCT I-STAT, CHEM 8
BUN: 9 mg/dL (ref 6–20)
CREATININE: 1.1 mg/dL — AB (ref 0.44–1.00)
Calcium, Ion: 1.31 mmol/L — ABNORMAL HIGH (ref 1.13–1.30)
Chloride: 95 mmol/L — ABNORMAL LOW (ref 101–111)
Glucose, Bld: 110 mg/dL — ABNORMAL HIGH (ref 65–99)
HEMATOCRIT: 39 % (ref 36.0–46.0)
HEMOGLOBIN: 13.3 g/dL (ref 12.0–15.0)
POTASSIUM: 3.9 mmol/L (ref 3.5–5.1)
Sodium: 136 mmol/L (ref 135–145)
TCO2: 23 mmol/L (ref 0–100)

## 2015-06-30 SURGERY — CYSTOSCOPY, FLEXIBLE, WITH STENT REPLACEMENT
Anesthesia: General | Laterality: Right

## 2015-06-30 MED ORDER — DEXAMETHASONE SODIUM PHOSPHATE 10 MG/ML IJ SOLN
INTRAMUSCULAR | Status: AC
  Filled 2015-06-30: qty 1

## 2015-06-30 MED ORDER — FENTANYL CITRATE (PF) 100 MCG/2ML IJ SOLN
INTRAMUSCULAR | Status: DC | PRN
  Administered 2015-06-30: 50 ug via INTRAVENOUS
  Administered 2015-06-30 (×2): 25 ug via INTRAVENOUS

## 2015-06-30 MED ORDER — PROPOFOL 10 MG/ML IV BOLUS
INTRAVENOUS | Status: DC | PRN
  Administered 2015-06-30: 20 mg via INTRAVENOUS
  Administered 2015-06-30: 30 mg via INTRAVENOUS
  Administered 2015-06-30: 20 mg via INTRAVENOUS

## 2015-06-30 MED ORDER — GENTAMICIN SULFATE 40 MG/ML IJ SOLN
260.0000 mg | INTRAVENOUS | Status: AC
  Administered 2015-06-30: 260 mg via INTRAVENOUS
  Filled 2015-06-30 (×2): qty 6.5

## 2015-06-30 MED ORDER — ONDANSETRON HCL 4 MG/2ML IJ SOLN
INTRAMUSCULAR | Status: DC | PRN
  Administered 2015-06-30: 4 mg via INTRAVENOUS

## 2015-06-30 MED ORDER — MIDAZOLAM HCL 5 MG/5ML IJ SOLN
INTRAMUSCULAR | Status: DC | PRN
Start: 2015-06-30 — End: 2015-06-30
  Administered 2015-06-30: 1 mg via INTRAVENOUS

## 2015-06-30 MED ORDER — PROMETHAZINE HCL 25 MG/ML IJ SOLN
6.2500 mg | INTRAMUSCULAR | Status: DC | PRN
  Filled 2015-06-30: qty 1

## 2015-06-30 MED ORDER — LACTATED RINGERS IV SOLN
INTRAVENOUS | Status: DC
  Administered 2015-06-30: 09:00:00 via INTRAVENOUS
  Filled 2015-06-30: qty 1000

## 2015-06-30 MED ORDER — SULFAMETHOXAZOLE-TRIMETHOPRIM 800-160 MG PO TABS: 1.0000 | ORAL_TABLET | Freq: Two times a day (BID) | ORAL | Status: AC

## 2015-06-30 MED ORDER — FENTANYL CITRATE (PF) 100 MCG/2ML IJ SOLN
INTRAMUSCULAR | Status: AC
  Filled 2015-06-30: qty 2

## 2015-06-30 MED ORDER — ARTIFICIAL TEARS OP OINT
TOPICAL_OINTMENT | OPHTHALMIC | Status: AC
  Filled 2015-06-30: qty 3.5

## 2015-06-30 MED ORDER — DEXAMETHASONE SODIUM PHOSPHATE 4 MG/ML IJ SOLN
INTRAMUSCULAR | Status: DC | PRN
  Administered 2015-06-30: 4 mg via INTRAVENOUS

## 2015-06-30 MED ORDER — FENTANYL CITRATE (PF) 100 MCG/2ML IJ SOLN
25.0000 ug | INTRAMUSCULAR | Status: DC | PRN
  Administered 2015-06-30: 50 ug via INTRAVENOUS
  Filled 2015-06-30: qty 1

## 2015-06-30 MED ORDER — MIDAZOLAM HCL 2 MG/2ML IJ SOLN
INTRAMUSCULAR | Status: AC
  Filled 2015-06-30: qty 2

## 2015-06-30 MED ORDER — PROPOFOL 10 MG/ML IV BOLUS
INTRAVENOUS | Status: AC
  Filled 2015-06-30: qty 20

## 2015-06-30 MED ORDER — ONDANSETRON HCL 4 MG/2ML IJ SOLN
INTRAMUSCULAR | Status: AC
  Filled 2015-06-30: qty 2

## 2015-06-30 MED ORDER — LIDOCAINE HCL (CARDIAC) 20 MG/ML IV SOLN
INTRAVENOUS | Status: DC | PRN
  Administered 2015-06-30: 40 mg via INTRAVENOUS

## 2015-06-30 MED ORDER — LIDOCAINE HCL (CARDIAC) 20 MG/ML IV SOLN
INTRAVENOUS | Status: AC
  Filled 2015-06-30: qty 5

## 2015-06-30 MED ORDER — SODIUM CHLORIDE 0.9 % IR SOLN
Status: DC | PRN
  Administered 2015-06-30: 1000 mL via INTRAVESICAL

## 2015-06-30 SURGICAL SUPPLY — 21 items
ADAPTER CATH URET PLST 4-6FR (CATHETERS) IMPLANT
BAG DRAIN URO-CYSTO SKYTR STRL (DRAIN) ×3 IMPLANT
CANISTER SUCT LVC 12 LTR MEDI- (MISCELLANEOUS) IMPLANT
CATH INTERMIT  6FR 70CM (CATHETERS) IMPLANT
CLOTH BEACON ORANGE TIMEOUT ST (SAFETY) ×3 IMPLANT
GLOVE BIO SURGEON STRL SZ8 (GLOVE) ×3 IMPLANT
GOWN STRL REUS W/ TWL LRG LVL3 (GOWN DISPOSABLE) ×1 IMPLANT
GOWN STRL REUS W/ TWL XL LVL3 (GOWN DISPOSABLE) ×1 IMPLANT
GOWN STRL REUS W/TWL LRG LVL3 (GOWN DISPOSABLE) ×2
GOWN STRL REUS W/TWL XL LVL3 (GOWN DISPOSABLE) ×2
GUIDEWIRE 0.038 PTFE COATED (WIRE) IMPLANT
GUIDEWIRE ANG ZIPWIRE 038X150 (WIRE) IMPLANT
GUIDEWIRE STR DUAL SENSOR (WIRE) IMPLANT
IV NS 1000ML (IV SOLUTION) ×2
IV NS 1000ML BAXH (IV SOLUTION) ×1 IMPLANT
KIT ROOM TURNOVER WOR (KITS) ×3 IMPLANT
MANIFOLD NEPTUNE II (INSTRUMENTS) ×3 IMPLANT
NS IRRIG 500ML POUR BTL (IV SOLUTION) IMPLANT
PACK CYSTO (CUSTOM PROCEDURE TRAY) ×3 IMPLANT
TUBE CONNECTING 12'X1/4 (SUCTIONS) ×1
TUBE CONNECTING 12X1/4 (SUCTIONS) ×2 IMPLANT

## 2015-06-30 NOTE — Discharge Instructions (Signed)
1. You may see some blood in the urine and may have some burning with urination for 48-72 hours. You also may notice that you have to urinate more frequently or urgently after your procedure which is normal.  2. You should call should you develop an inability urinate, fever > 101, persistent nausea and vomiting that prevents you from eating or drinking to stay hydrated.  3. If you have a stent, you will likely urinate more frequently and urgently until the stent is removed and you may experience some discomfort/pain in the lower abdomen and flank especially when urinating. You may take pain medication prescribed to you if needed for pain. You may also intermittently have blood in the urine until the stent is removed. 4. If you have a catheter, you will be taught how to take care of the catheter by the nursing staff prior to discharge from the hospital.  You may periodically feel a strong urge to void with the catheter in place.  This is a bladder spasm and most often can occur when having a bowel movement or moving around. It is typically self-limited and usually will stop after a few minutes.  You may use some Vaseline or Neosporin around the tip of the catheter to reduce friction at the tip of the penis. You may also see some blood in the urine.  A very small amount of blood can make the urine look quite red.  As long as the catheter is draining well, there usually is not a problem.  However, if the catheter is not draining well and is bloody, you should call the office [phone]) to notify us.   Post Anesthesia Home Care Instructions  Activity: Get plenty of rest for the remainder of the day. A responsible adult should stay with you for 24 hours following the procedure.  For the next 24 hours, DO NOT: -Drive a car -Paediatric nurse -Drink alcoholic beverages -Take any medication unless instructed by your physician -Make any legal decisions or sign important papers.  Meals: Start with liquid  foods such as gelatin or soup. Progress to regular foods as tolerated. Avoid greasy, spicy, heavy foods. If nausea and/or vomiting occur, drink only clear liquids until the nausea and/or vomiting subsides. Call your physician if vomiting continues.  Special Instructions/Symptoms: Your throat may feel dry or sore from the anesthesia or the breathing tube placed in your throat during surgery. If this causes discomfort, gargle with warm salt water. The discomfort should disappear within 24 hours.  If you had a scopolamine patch placed behind your ear for the management of post- operative nausea and/or vomiting:  1. The medication in the patch is effective for 72 hours, after which it should be removed.  Wrap patch in a tissue and discard in the trash. Wash hands thoroughly with soap and water. 2. You may remove the patch earlier than 72 hours if you experience unpleasant side effects which may include dry mouth, dizziness or visual disturbances. 3. Avoid touching the patch. Wash your hands with soap and water after contact with the patch.

## 2015-06-30 NOTE — Op Note (Signed)
PATIENT:  Kirsten Schroeder  PRE-OPERATIVE DIAGNOSIS: Malignant right hydronephrosis  POST-OPERATIVE DIAGNOSIS: Same  PROCEDURE: Cystoscopy, double-J stent removal  SURGEON:  [name]. Nikko Quast, M.D.  ANESTHESIA:  General  EBL:  Minimal  DRAINS: None  LOCAL MEDICATIONS USED:  None  SPECIMEN:  None  INDICATION: [name] is a 60 year old female with progressive, end-stage GYN cancer. She has had malignant hydronephrosis on the right. She has had stents on that right side for quite some time. She does have symptoms from the stent-dysuria, frequency, urgency, strangury. She is nearing hospice care. The patient is up for stent change, but with her declining status, it is felt that perhaps stent removal would be more appropriate for this patient at this time. I have discussed this with the patient, and communicated with Dr. Marko Plume, who is her oncologist. We will proceed at this time for cystoscopy and stent removal. There is a small but measurable chance of having infection in that right kidney which would not be properly drained, but she has been on antibiotic suppression up until this point.  Description of procedure: The patient was properly identified and marked (if applicable) in the holding area. They were then  taken to the operating room and placed on the table in a supine position. General anesthesia was then administered. Once fully anesthetized the patient was moved to the dorsolithotomy position and the genitalia and perineum were sterilely prepped and draped in standard fashion. An official timeout was then performed.  A 23 French panendoscope was advanced into her bladder which was briefly examined and found to be normal except for the stent coming from her right ureteral orifice. This was grasped, and the stent was removed intact. There was a fair amount of encrustation both proximally and distally on the stent. The bladder was then drained. The procedure was terminated. She  tolerated the procedure well.    PLAN OF CARE: Discharge to home after PACU  PATIENT DISPOSITION:  PACU - hemodynamically stable.

## 2015-06-30 NOTE — Transfer of Care (Signed)
Last Vitals:  Filed Vitals:   06/30/15 0841  BP: 134/78  Pulse: 135  Temp: 36.9 C  Resp: 16    Immediate Anesthesia Transfer of Care Note  Patient: Kirsten Schroeder  Procedure(s) Performed: Procedure(s) (LRB): CYSTOSCOPY WITH STENT REMOVAL (Right)  Patient Location: PACU  Anesthesia Type: MAC  Level of Consciousness: awake, alert  and oriented  Airway & Oxygen Therapy: Patient Spontanous Breathing and Patient connected to nasal cannula oxygen  Post-op Assessment: Report given to PACU RN and Post -op Vital signs reviewed and stable  Post vital signs: Reviewed and stable  Complications: No apparent anesthesia complications

## 2015-06-30 NOTE — Anesthesia Procedure Notes (Signed)
Procedure Name: MAC Date/Time: 06/30/2015 9:40 AM Performed by: Mechele Claude Pre-anesthesia Checklist: Patient identified, Emergency Drugs available, Suction available, Patient being monitored and Timeout performed Patient Re-evaluated:Patient Re-evaluated prior to inductionOxygen Delivery Method: Circle system utilized Preoxygenation: Pre-oxygenation with 100% oxygen Intubation Type: IV induction Placement Confirmation: positive ETCO2,  CO2 detector and breath sounds checked- equal and bilateral

## 2015-06-30 NOTE — Anesthesia Postprocedure Evaluation (Signed)
Anesthesia Post Note  Patient: Kirsten Schroeder  Procedure(s) Performed: Procedure(s) (LRB): CYSTOSCOPY WITH STENT REMOVAL (Right)  Patient location during evaluation: PACU Anesthesia Type: MAC Level of consciousness: awake and alert Pain management: pain level controlled Vital Signs Assessment: post-procedure vital signs reviewed and stable Respiratory status: spontaneous breathing, nonlabored ventilation, respiratory function stable and patient connected to nasal cannula oxygen Cardiovascular status: stable and blood pressure returned to baseline Anesthetic complications: no    Last Vitals:  Filed Vitals:   06/30/15 1020 06/30/15 1030  BP:  127/74  Pulse: 125 125  Temp:    Resp: 23 25    Last Pain:  Filed Vitals:   06/30/15 1043  PainSc: Asleep                 Venida Tsukamoto J

## 2015-06-30 NOTE — Anesthesia Preprocedure Evaluation (Addendum)
Anesthesia Evaluation  Patient identified by MRN, date of birth, ID band Patient awake    Reviewed: Allergy & Precautions, NPO status , Patient's Chart, lab work & pertinent test results  Airway Mallampati: II  TM Distance: >3 FB Neck ROM: Full    Dental no notable dental hx.    Pulmonary  Pleural effusion s/p thoracentesis 06-23-15  On oxygen PRN at home.   Pulmonary exam normal breath sounds clear to auscultation       Cardiovascular hypertension, Normal cardiovascular exam Rhythm:Regular Rate:Normal  Resting heart rate about 125 which has been normal for her recently and other doctors have commented on it per daughter.   Neuro/Psych  Neuromuscular disease negative psych ROS   GI/Hepatic Neg liver ROS, PUD,   Endo/Other  negative endocrine ROS  Renal/GU Renal InsufficiencyRenal diseaseCr 1.10 K 3.9  negative genitourinary   Musculoskeletal negative musculoskeletal ROS (+)   Abdominal   Peds negative pediatric ROS (+)  Hematology  (+) anemia ,   Anesthesia Other Findings   Reproductive/Obstetrics negative OB ROS                           Anesthesia Physical Anesthesia Plan  ASA: IV  Anesthesia Plan: MAC   Post-op Pain Management:    Induction: Intravenous  Airway Management Planned: Natural Airway  Additional Equipment:   Intra-op Plan:   Post-operative Plan:   Informed Consent: I have reviewed the patients History and Physical, chart, labs and discussed the procedure including the risks, benefits and alternatives for the proposed anesthesia with the patient or authorized representative who has indicated his/her understanding and acceptance.   Dental advisory given  Plan Discussed with: CRNA  Anesthesia Plan Comments: (Chronic usage of MS contin and Ativan. Anticipate increased needs for analgesics.)      Anesthesia Quick Evaluation

## 2015-06-30 NOTE — H&P (Signed)
H&P  Chief Complaint: Blocked right kidney  History of Present Illness: Kirsten Schroeder is a 60 y.o. year old female with advanced GYN cancer with subsequent malignant right hydronephrosis. She presents now for right J2 stent exchange. She has had persistent pyuria and recent worsening bladder sx's due to this current long-term indwelling stent.  Past Medical History  Diagnosis Date  . Ulcerative colitis ([place])   . Hydronephrosis, right   . Ejection fraction < 50%     EF 56%  AND NORMAL WALL MOTION --  PER  [facility]  09-06-2013  . 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 Wears glasses   . Hydronephrosis, right   . Chemotherapy induced thrombocytopenia   . Peripheral neuropathy due to chemotherapy ([place])   . Hypertension     PT CURRENT NOT TAKING BP MEDICATION PER PCP SINCE BP IS LOW SECONDARY TO CHEMO  . Radiation 01/15/15-02/19/15    pelvic mass 45 gray  . Pleural effusion     06-23-2015  THORENCENTESIS BY IR --  DRAINED 1.5L YELLOW PLEURAL FLUID---  POST CXR  PERSISTANT SMALL PLEURAL EFFUSION  . CKD (chronic kidney disease), stage II   . Ileostomy in place [facility])   . On supplemental oxygen therapy     ordered PRN by dr Marko Plume--  pt O2 sat's low due to pleural effusion--  pt states much better and weaning off after thoracentisis 06-23-2015  . Ovarian cancer ([place]) AUG 2014   SEROUS GYN CARCINOMA---  ONCOLOGIST--  DR Marko Plume---   Stage IVB,  Hospice care    CURRENT CHEMOTHERAPY AND NEEDING MULTIPLE BLOOD TRANSFUSIONS  AND RADIATION THERAPY  . Bone metastasis ([facility])     MILD ---  SECONDARY OVARIAN CANCER  . 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 Pulmonary metastasis ([facility])     bilateral   . Chronic pain   . Chronic narcotic use   . History of sepsis     admitted 06-03-2015  due to UTI      Past Surgical History  Procedure Laterality Date  . Cystoscopy w/ ureteral stent placement Right 01/12/2013    Procedure: CYSTOSCOPY WITH RETROGRADE PYELOGRAM/ RIGHT DOUBLE J STENT PLACEMENT;   Surgeon: Franchot Gallo, MD;  Location: [facility];  Service: Urology;  Laterality: Right;  . Cesarean section    . Cholecystectomy  1992  . Total colectomy w/ permanent ileostomy  1990  (2 PART SURGERY)    SEVERE UC  . Cystoscopy w/ ureteral stent placement Right 09/13/2013    Procedure: CYSTOSCOPY WITH STENT REPLACEMENT;  Surgeon: Franchot Gallo, MD;  Location: [facility];  Service: Urology;  Laterality: Right;  . Cystoscopy w/ ureteral stent placement Right 07/22/2014    Procedure: CYSTOSCOPY WITH STENT REPLACEMENT;  Surgeon: Jorja Loa, MD;  [facility];  Service: Urology;  Laterality: Right;    Home Medications:  No prescriptions prior to admission    Allergies:  Allergies  Allergen Reactions  . Penicillins Rash    As a child. Has patient had a PCN reaction causing immediate rash, facial/tongue/throat swelling, SOB or lightheadedness with hypotension: unknown Has patient had a PCN reaction causing severe rash involving mucus membranes or skin necrosis: unknown Has patient had a PCN reaction that required hospitalization: unknown Has patient had a PCN reaction occurring within the last 10 years: unknown If all of the above answers are "NO", then may proceed with Cephalosporin use.     Family History  Problem Relation Age of  Onset  . Hypertension Mother   . Arthritis Mother   . Stroke Mother   . Hypertension Father   . Diabetes Father   . Lung cancer Father   . Hypertension Brother   . Diabetes Brother   . Skin cancer Sister   . Prostate cancer Paternal Grandfather   . Leukemia Maternal Aunt   . Stomach cancer Maternal Grandfather     Social History:  reports that she has never smoked. She has never used smokeless tobacco. She reports that she does not drink alcohol or use illicit drugs.  ROS: A complete review of systems was performed.  All systems are negative except for pertinent findings as noted.  Physical  Exam:  Vital signs in last 24 hours:   General:  Alert and oriented, No acute distress. Slight cachexia HEENT: Normocephalic, atraumatic Neck: No JVD or lymphadenopathy Cardiovascular: Regular rate and rhythm Lungs: Clear bilaterally Abdomen: Soft, nontender, nondistended, no abdominal masses Back: No CVA tenderness Extremities: No edema Neurologic: Grossly intact  Laboratory Data:  No results found for this or any previous visit (from the past 24 hour(s)). No results found for this or any previous visit (from the past 240 hour(s)). Creatinine: No results for input(s): CREATININE in the last 168 hours.  Radiologic Imaging: No results found.  Impression/Assessment:  Right hydronephrosis, with indwelling stent  Plan:  Cysto, right J2 stent exchange  [name] 06/30/2015, 8:21 AM  [name]. [name] MD

## 2015-07-01 ENCOUNTER — Encounter (HOSPITAL_BASED_OUTPATIENT_CLINIC_OR_DEPARTMENT_OTHER): Payer: Self-pay | Admitting: Urology

## 2015-07-02 ENCOUNTER — Telehealth: Payer: Self-pay

## 2015-07-02 ENCOUNTER — Other Ambulatory Visit: Payer: Self-pay | Admitting: Oncology

## 2015-07-02 DIAGNOSIS — D6481 Anemia due to antineoplastic chemotherapy: Secondary | ICD-10-CM

## 2015-07-02 DIAGNOSIS — C569 Malignant neoplasm of unspecified ovary: Secondary | ICD-10-CM

## 2015-07-02 DIAGNOSIS — B370 Candidal stomatitis: Secondary | ICD-10-CM

## 2015-07-02 DIAGNOSIS — T451X5A Adverse effect of antineoplastic and immunosuppressive drugs, initial encounter: Principal | ICD-10-CM

## 2015-07-02 MED ORDER — FLUCONAZOLE 100 MG PO TABS: 100.0000 mg | ORAL_TABLET | Freq: Every day | ORAL | Status: AC

## 2015-07-02 NOTE — Telephone Encounter (Signed)
Kirsten Schroeder stated that Kirsten Schroeder has white patches in her mouth.  Mouth sore.  Kirsten Schroeder says Dr. Marko Plume usually gives her a medication for this but could not remember the name.

## 2015-07-02 NOTE — Telephone Encounter (Signed)
LM for [name], RN that Dr. Marko Plume sent in Diflucan 100 mg daily x 7 to her pharmacy.  Call back to office is she has any questions or concerns.

## 2015-07-04 ENCOUNTER — Ambulatory Visit: Payer: BC Managed Care – PPO | Admitting: Gynecology

## 2015-07-05 ENCOUNTER — Other Ambulatory Visit: Payer: Self-pay | Admitting: Oncology

## 2015-07-07 ENCOUNTER — Other Ambulatory Visit: Payer: BC Managed Care – PPO

## 2015-07-07 ENCOUNTER — Ambulatory Visit: Payer: BC Managed Care – PPO | Admitting: Oncology

## 2015-07-14 ENCOUNTER — Encounter: Payer: Self-pay | Admitting: General Practice

## 2015-07-14 ENCOUNTER — Telehealth: Payer: Self-pay

## 2015-07-14 NOTE — Telephone Encounter (Signed)
Hospice staff called to inform Dr. Marko Plume that Ms. Kirsten Schroeder passed away on Sep 10, 2022 07/25/15 at 1315.

## 2015-07-14 NOTE — Progress Notes (Signed)
Spiritual Care Note  Left VM of support per arrangement with pt.  Plan to send f/u note of encouragement and care as well.  [place], [place], [facility] Pager [phone] Voicemail  [phone]

## 2015-07-23 DEATH — deceased

## 2015-07-28 ENCOUNTER — Encounter: Payer: Self-pay | Admitting: Oncology

## 2015-07-28 NOTE — Progress Notes (Signed)
Medical Oncology  Per Hospice, patient died at home on 2015-08-10, with extensively metastatic gyn carcinoma. Sympathy letter written to family.  Will let other MDs know, as patient, family and I all appreciated their assistance with her care.  Godfrey Pick, MD

## 2015-08-15 ENCOUNTER — Ambulatory Visit: Payer: BC Managed Care – PPO | Admitting: Gynecology

## 2015-08-18 ENCOUNTER — Encounter: Payer: Self-pay | Admitting: Family Medicine

## 2015-09-12 IMAGING — CT CT ABD-PELV W/ CM
1 of 4 series · 14 of 32 positions shown, 19 images · IV contrast (OMNIPAQUE 300)
Comparison: 02/27/2014

CLINICAL DATA: Ovarian carcinoma

EXAM:
CT ABDOMEN AND PELVIS WITH CONTRAST
TECHNIQUE: Multidetector CT imaging of the abdomen and pelvis was performed
using the standard protocol following bolus administration of
intravenous contrast.
CONTRAST:  100mL OMNIPAQUE IOHEXOL 300 MG/ML  SOLN

[Series 2: abd/pel with · axial · 0.72mm/px · z∈[-450,-60]mm · 14 of 90 slices shown, 19 images]
[im 6/90  soft-tissue]
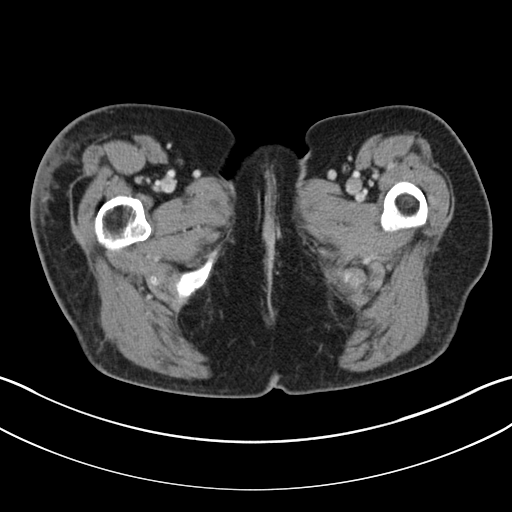
[im 6/90  bone]
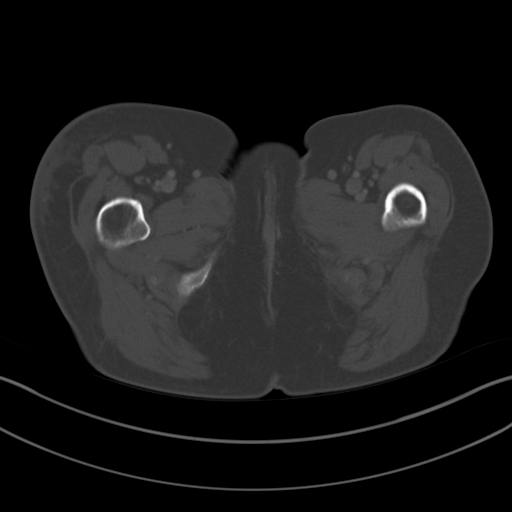
[im 11/90  soft-tissue]
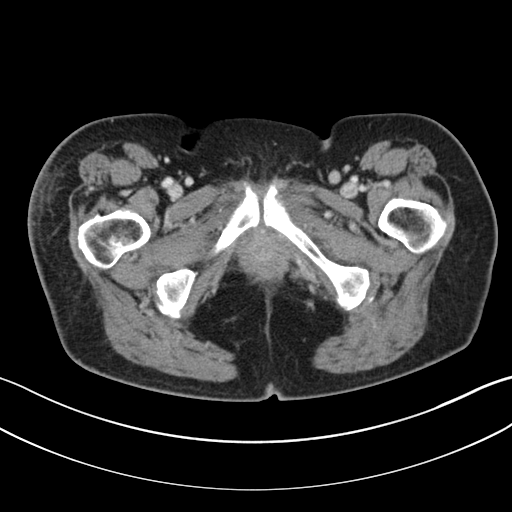
[im 21/90  soft-tissue]
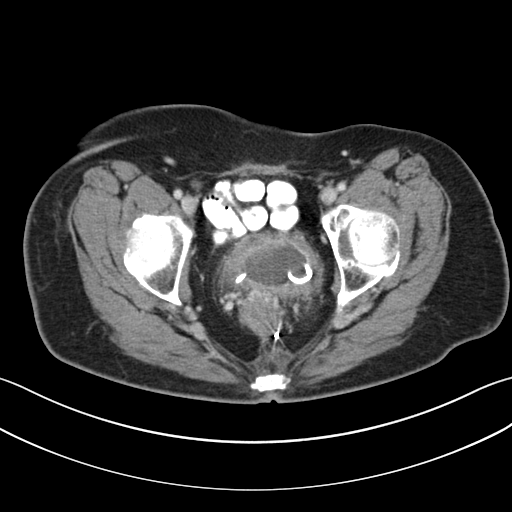
[im 27/90  soft-tissue]
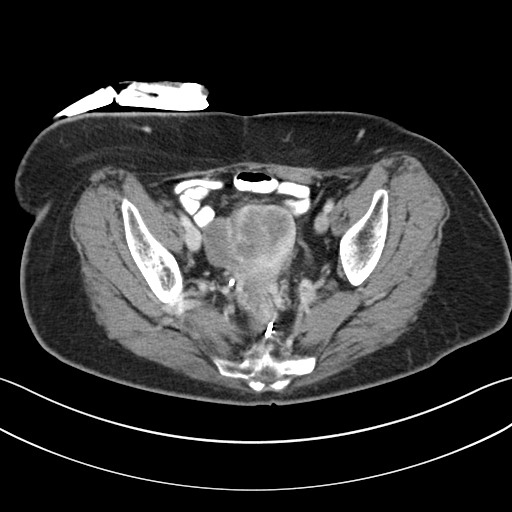
[im 32/90  soft-tissue]
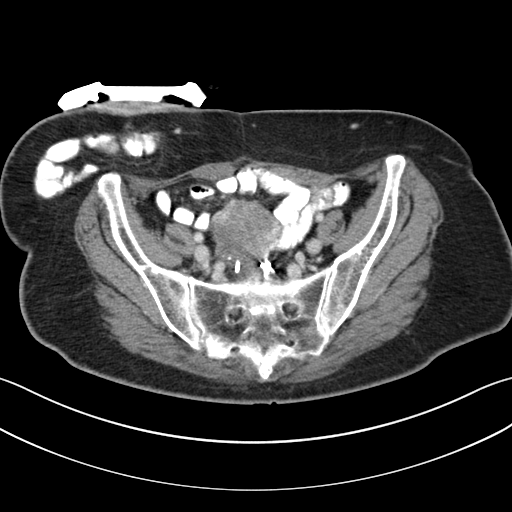
[im 37/90  soft-tissue]
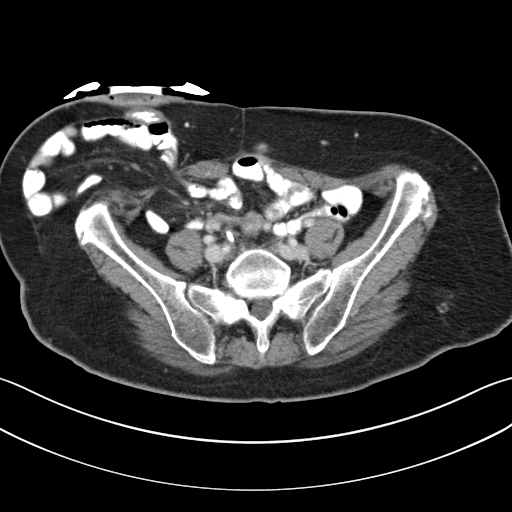
[im 48/90  soft-tissue]
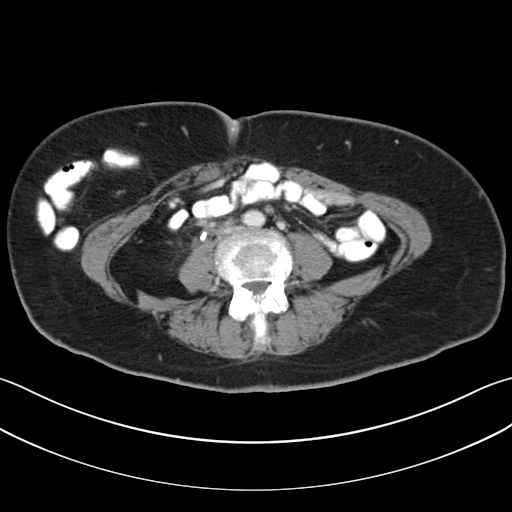
[im 53/90  soft-tissue]
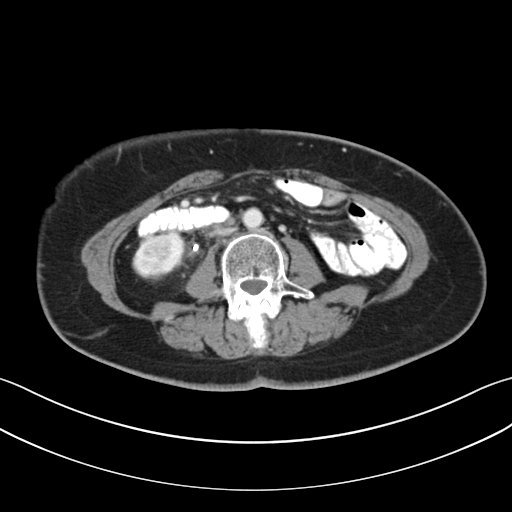
[im 58/90  soft-tissue]
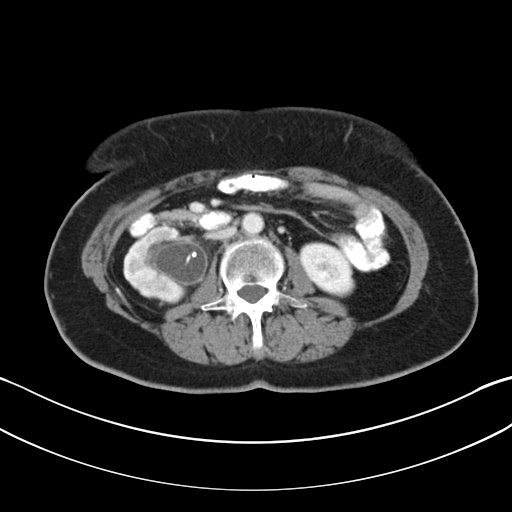
[im 58/90  bone]
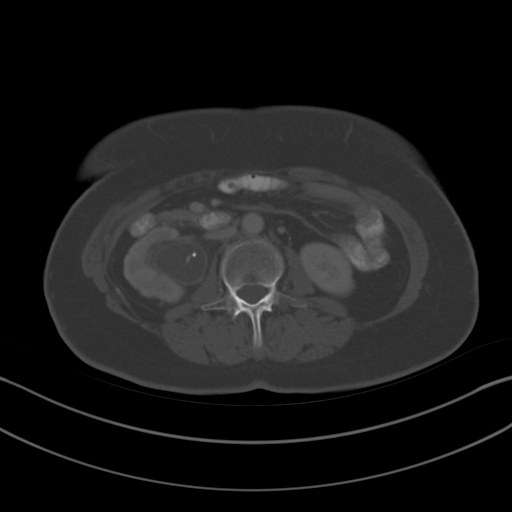
[im 63/90  soft-tissue]
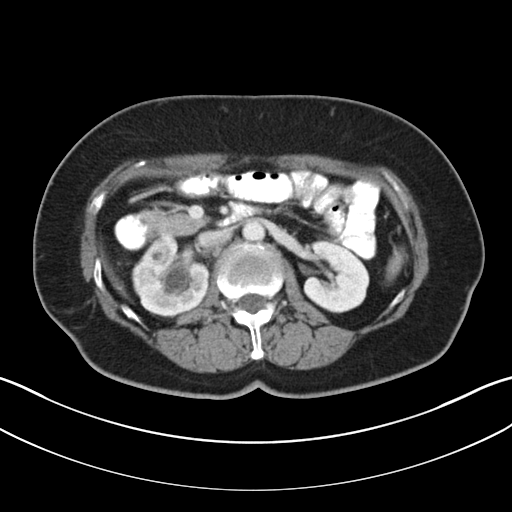
[im 69/90  soft-tissue]
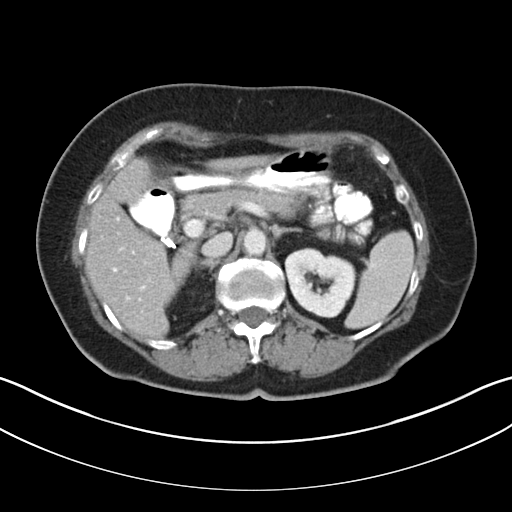
[im 69/90  lung]
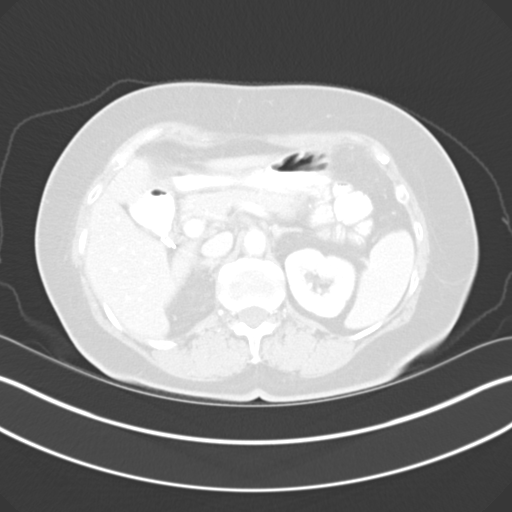
[im 74/90  lung]
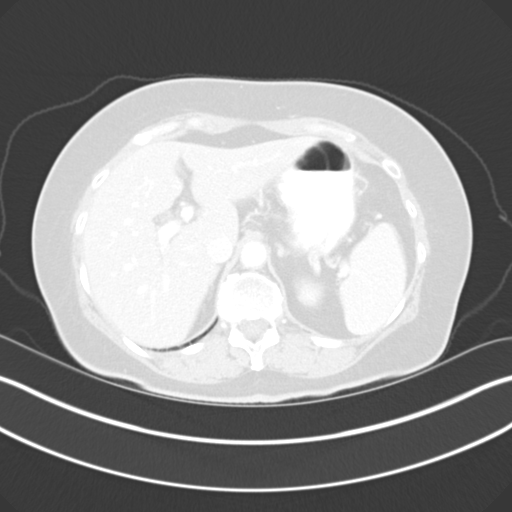
[im 79/90  soft-tissue]
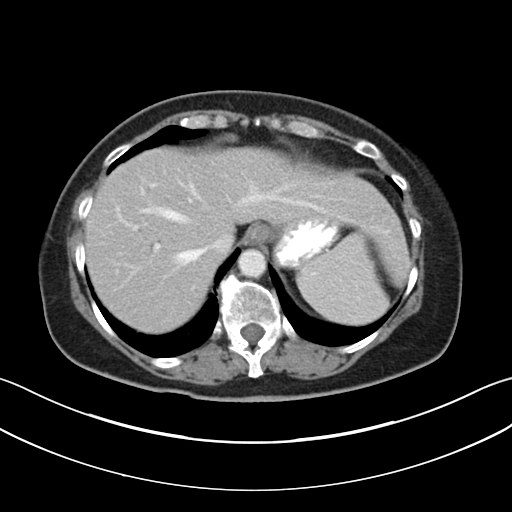
[im 79/90  lung]
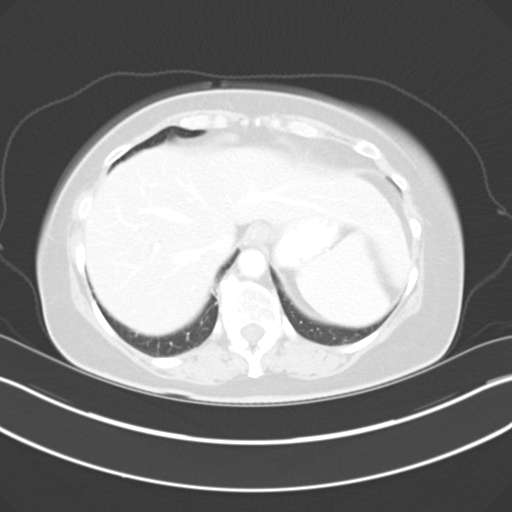
[im 84/90  soft-tissue]
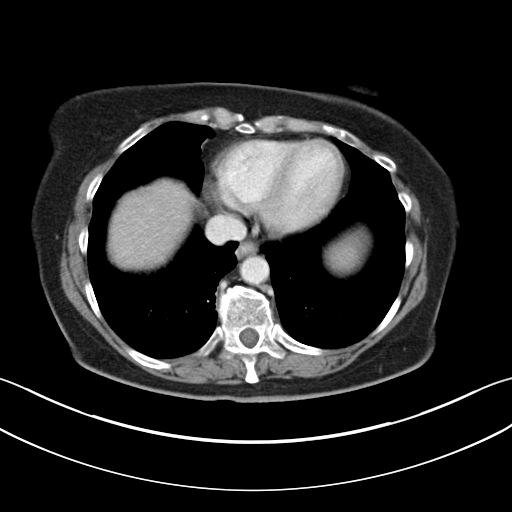
[im 84/90  lung]
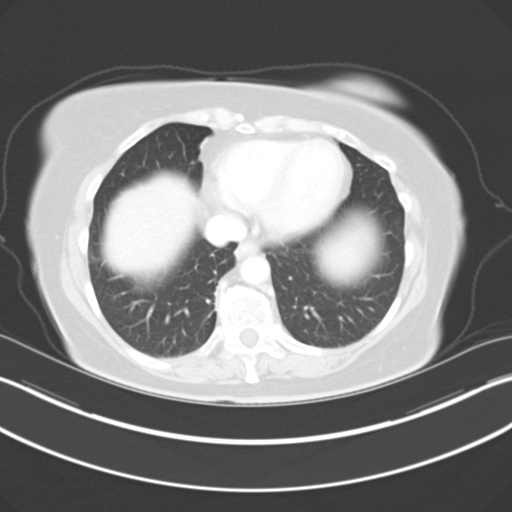

[14 of 32 positions shown; findings below may reference images not displayed]

FINDINGS: Lower chest: The lung bases are clear. No pleural or pericardial
effusion.

Hepatobiliary: There is no suspicious liver abnormality. Previous
cholecystectomy.

Pancreas: The pancreas appears normal.

Spleen: Normal appearance of the spleen.

Adrenals/Urinary Tract: The adrenal glands are both normal. The left
kidney is unremarkable. Right-sided hydronephrosis is again
identified. A nephro ureteral stent is in place. The degree of right
hydronephrosis is similar to the previous exam. Thick walled urinary
bladder is identified which may in part reflect incomplete
distention.

Stomach/Bowel: The stomach and the small bowel loops have a normal
course and caliber. The patient is status post colectomy. There is a
right lower quadrant ileostomy with bowel containing parastomal
hernia.

Vascular/Lymphatic: Normal appearance of the abdominal aorta. No
enlarged lymph nodes identified within the upper abdomen. No iliac
or inguinal adenopathy.

Reproductive: Heterogeneously enhancing pelvic mass contiguous with
the uterus measures 5.8 x 6.7 x 5.9 cm. Previously this measured
x 6.5 x 6.4 cm.

Other: No ascites.  No focal fluid collections.

Musculoskeletal: No aggressive lytic or sclerotic bone lesions
identified.
IMPRESSION: 1. No acute findings.
2. Stable to slight decrease in size of pelvic mass.
3. Persistent right hydronephrosis secondary to ureteral
obstruction. The degree of obstructive uropathy is stable when
compared with prior exam and the ureteral stent remains in
appropriate position.

## 2015-11-10 ENCOUNTER — Other Ambulatory Visit: Payer: Self-pay | Admitting: Nurse Practitioner

## 2016-07-07 ENCOUNTER — Encounter (HOSPITAL_COMMUNITY): Payer: Self-pay

## 2016-08-09 IMAGING — CR DG CHEST 2V
2 series · 2 of 2 positions shown · non-contrast
Comparison: 01/11/2013

CLINICAL DATA: Metastatic ovarian cancer.

EXAM:
CHEST  2 VIEW

[w chest pa]
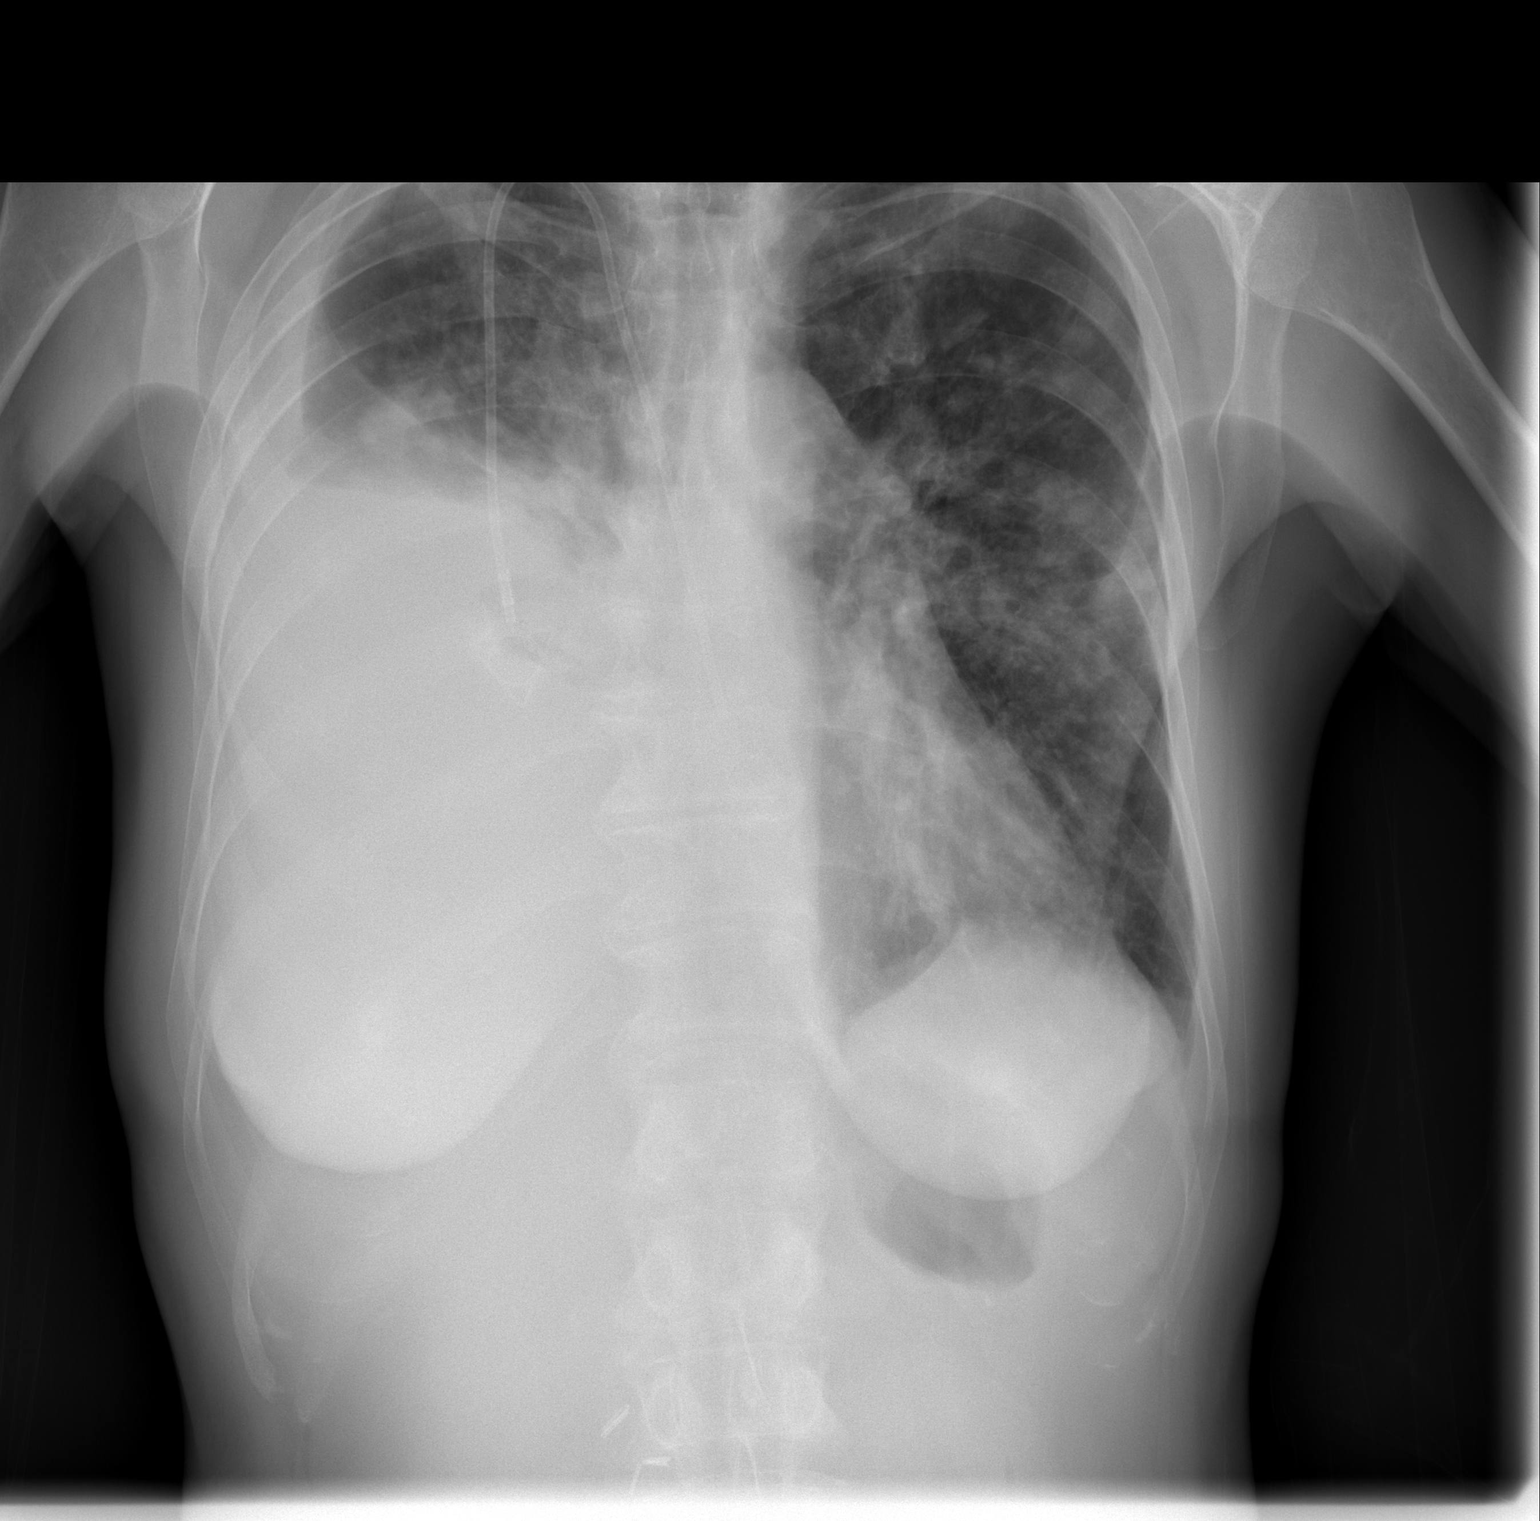

[w chest lat]
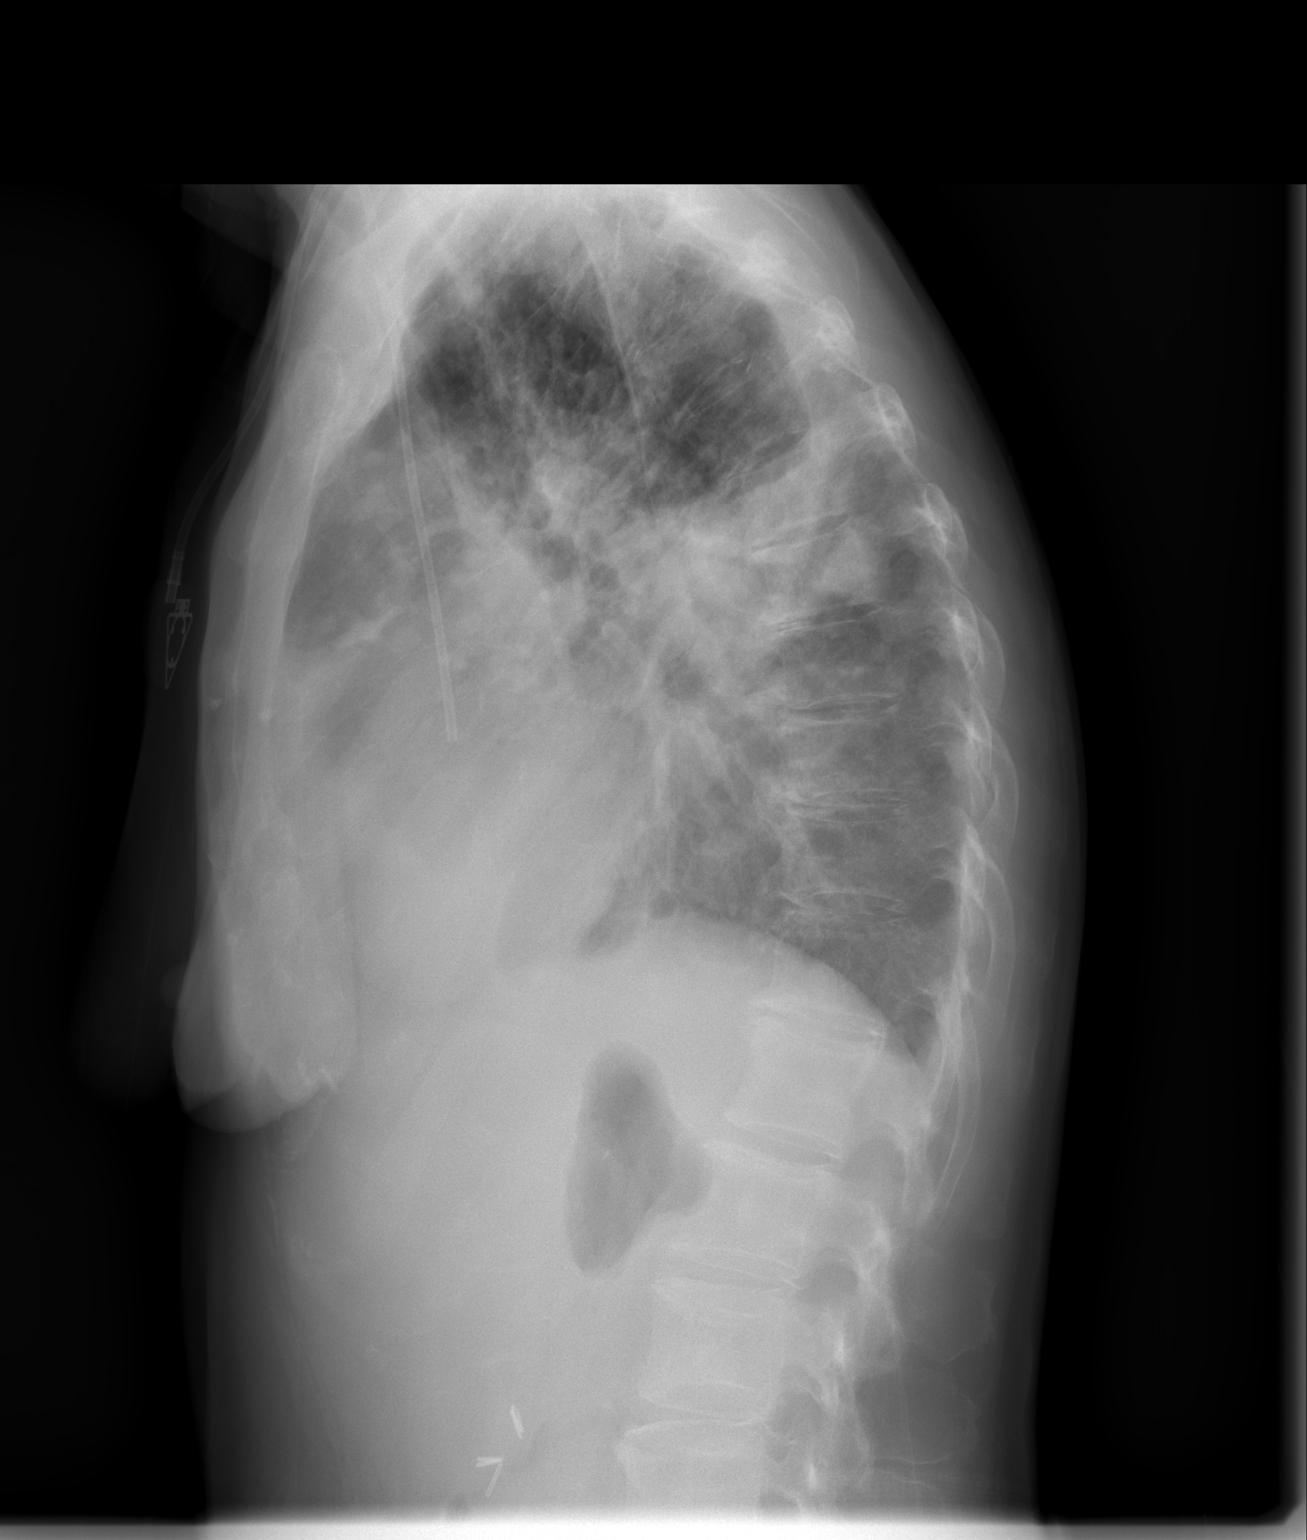

[2 of 2 positions shown; findings below may reference images not displayed]

FINDINGS: Right Port-A-Cath is in place with the tip near the cavoatrial
junction. There is a large right pleural effusion. Innumerable
bilateral pulmonary nodules are noted most compatible with
metastases. Heart is normal size. No effusion on the left. No acute
bony abnormality.
IMPRESSION: Innumerable bilateral pulmonary nodules compatible with metastases.

Large right pleural effusion.
